# Patient Record
Sex: Male | Born: 1950 | Race: White | Hispanic: No | Marital: Single | Smoking: Former smoker
Health system: Southern US, Community
[De-identification: ages and names within clinical notes are randomized; demographics above are authoritative.]

## PROBLEM LIST (undated history)

## (undated) ENCOUNTER — Emergency Department (HOSPITAL_COMMUNITY): Admission: EM | Payer: Medicare Other | Source: Home / Self Care

## (undated) DIAGNOSIS — K219 Gastro-esophageal reflux disease without esophagitis: Secondary | ICD-10-CM

## (undated) DIAGNOSIS — K859 Acute pancreatitis without necrosis or infection, unspecified: Secondary | ICD-10-CM

## (undated) DIAGNOSIS — R059 Cough, unspecified: Secondary | ICD-10-CM

## (undated) DIAGNOSIS — L259 Unspecified contact dermatitis, unspecified cause: Secondary | ICD-10-CM

## (undated) DIAGNOSIS — M545 Low back pain, unspecified: Secondary | ICD-10-CM

## (undated) DIAGNOSIS — J449 Chronic obstructive pulmonary disease, unspecified: Secondary | ICD-10-CM

## (undated) DIAGNOSIS — F419 Anxiety disorder, unspecified: Secondary | ICD-10-CM

## (undated) DIAGNOSIS — M8580 Other specified disorders of bone density and structure, unspecified site: Secondary | ICD-10-CM

## (undated) DIAGNOSIS — J329 Chronic sinusitis, unspecified: Secondary | ICD-10-CM

## (undated) DIAGNOSIS — B182 Chronic viral hepatitis C: Secondary | ICD-10-CM

## (undated) DIAGNOSIS — N400 Enlarged prostate without lower urinary tract symptoms: Secondary | ICD-10-CM

## (undated) DIAGNOSIS — F32A Depression, unspecified: Secondary | ICD-10-CM

## (undated) DIAGNOSIS — T07XXXA Unspecified multiple injuries, initial encounter: Secondary | ICD-10-CM

## (undated) DIAGNOSIS — J189 Pneumonia, unspecified organism: Secondary | ICD-10-CM

## (undated) DIAGNOSIS — Z970 Presence of artificial eye: Secondary | ICD-10-CM

## (undated) DIAGNOSIS — J302 Other seasonal allergic rhinitis: Secondary | ICD-10-CM

## (undated) DIAGNOSIS — G8929 Other chronic pain: Secondary | ICD-10-CM

## (undated) DIAGNOSIS — H548 Legal blindness, as defined in USA: Secondary | ICD-10-CM

## (undated) DIAGNOSIS — I10 Essential (primary) hypertension: Secondary | ICD-10-CM

## (undated) DIAGNOSIS — S0292XA Unspecified fracture of facial bones, initial encounter for closed fracture: Secondary | ICD-10-CM

## (undated) DIAGNOSIS — R519 Headache, unspecified: Secondary | ICD-10-CM

## (undated) DIAGNOSIS — R05 Cough: Secondary | ICD-10-CM

## (undated) DIAGNOSIS — Z8719 Personal history of other diseases of the digestive system: Secondary | ICD-10-CM

## (undated) DIAGNOSIS — M1990 Unspecified osteoarthritis, unspecified site: Secondary | ICD-10-CM

## (undated) DIAGNOSIS — R51 Headache: Secondary | ICD-10-CM

## (undated) DIAGNOSIS — J45909 Unspecified asthma, uncomplicated: Secondary | ICD-10-CM

## (undated) DIAGNOSIS — Z87442 Personal history of urinary calculi: Secondary | ICD-10-CM

## (undated) DIAGNOSIS — F329 Major depressive disorder, single episode, unspecified: Secondary | ICD-10-CM

## (undated) HISTORY — PX: EYE SURGERY: SHX253

## (undated) HISTORY — PX: TONSILLECTOMY AND ADENOIDECTOMY: SUR1326

## (undated) HISTORY — DX: Unspecified contact dermatitis, unspecified cause: L25.9

## (undated) HISTORY — DX: Other specified disorders of bone density and structure, unspecified site: M85.80

## (undated) HISTORY — PX: FRACTURE SURGERY: SHX138

## (undated) HISTORY — PX: ENUCLEATION: SHX628

## (undated) HISTORY — PX: OTHER SURGICAL HISTORY: SHX169

## (undated) HISTORY — DX: Legal blindness, as defined in USA: H54.8

## (undated) HISTORY — PX: INGUINAL HERNIA REPAIR: SUR1180

## (undated) HISTORY — DX: Cough: R05

## (undated) HISTORY — DX: Chronic sinusitis, unspecified: J32.9

## (undated) HISTORY — DX: Unspecified asthma, uncomplicated: J45.909

## (undated) HISTORY — DX: Gastro-esophageal reflux disease without esophagitis: K21.9

## (undated) HISTORY — PX: PATELLA FRACTURE SURGERY: SHX735

## (undated) HISTORY — PX: COLONOSCOPY W/ BIOPSIES AND POLYPECTOMY: SHX1376

## (undated) HISTORY — DX: Cough, unspecified: R05.9

---

## 1997-10-30 ENCOUNTER — Encounter: Admission: RE | Admit: 1997-10-30 | Discharge: 1998-01-28 | Payer: Self-pay | Admitting: Anesthesiology

## 1997-11-02 ENCOUNTER — Emergency Department (HOSPITAL_COMMUNITY): Admission: EM | Admit: 1997-11-02 | Discharge: 1997-11-02 | Payer: Self-pay | Admitting: Internal Medicine

## 1998-01-05 ENCOUNTER — Emergency Department (HOSPITAL_COMMUNITY): Admission: EM | Admit: 1998-01-05 | Discharge: 1998-01-05 | Payer: Self-pay

## 1998-02-06 ENCOUNTER — Encounter: Admission: RE | Admit: 1998-02-06 | Discharge: 1998-04-30 | Payer: Self-pay | Admitting: Anesthesiology

## 1998-04-30 ENCOUNTER — Encounter: Admission: RE | Admit: 1998-04-30 | Discharge: 1998-07-25 | Payer: Self-pay | Admitting: Anesthesiology

## 1998-07-25 ENCOUNTER — Encounter: Admission: RE | Admit: 1998-07-25 | Discharge: 1998-10-23 | Payer: Self-pay | Admitting: Anesthesiology

## 1999-01-27 ENCOUNTER — Emergency Department (HOSPITAL_COMMUNITY): Admission: EM | Admit: 1999-01-27 | Discharge: 1999-01-27 | Payer: Self-pay | Admitting: Emergency Medicine

## 1999-01-27 ENCOUNTER — Encounter: Payer: Self-pay | Admitting: Emergency Medicine

## 1999-11-01 ENCOUNTER — Ambulatory Visit (HOSPITAL_COMMUNITY): Admission: RE | Admit: 1999-11-01 | Discharge: 1999-11-01 | Payer: Self-pay | Admitting: Internal Medicine

## 1999-11-01 ENCOUNTER — Encounter: Payer: Self-pay | Admitting: Internal Medicine

## 2001-06-20 ENCOUNTER — Emergency Department (HOSPITAL_COMMUNITY): Admission: EM | Admit: 2001-06-20 | Discharge: 2001-06-20 | Payer: Self-pay | Admitting: Emergency Medicine

## 2001-06-20 ENCOUNTER — Encounter: Payer: Self-pay | Admitting: Emergency Medicine

## 2001-06-22 ENCOUNTER — Inpatient Hospital Stay (HOSPITAL_COMMUNITY): Admission: EM | Admit: 2001-06-22 | Discharge: 2001-06-25 | Payer: Self-pay | Admitting: Emergency Medicine

## 2001-06-22 ENCOUNTER — Encounter: Payer: Self-pay | Admitting: Emergency Medicine

## 2001-06-29 ENCOUNTER — Encounter: Payer: Self-pay | Admitting: Specialist

## 2001-06-30 ENCOUNTER — Encounter: Payer: Self-pay | Admitting: Specialist

## 2001-06-30 ENCOUNTER — Inpatient Hospital Stay (HOSPITAL_COMMUNITY): Admission: RE | Admit: 2001-06-30 | Discharge: 2001-07-03 | Payer: Self-pay | Admitting: Specialist

## 2001-09-01 ENCOUNTER — Encounter: Admission: RE | Admit: 2001-09-01 | Discharge: 2001-11-30 | Payer: Self-pay | Admitting: *Deleted

## 2001-11-26 ENCOUNTER — Ambulatory Visit (HOSPITAL_COMMUNITY): Admission: RE | Admit: 2001-11-26 | Discharge: 2001-11-26 | Payer: Self-pay | Admitting: Specialist

## 2001-11-26 ENCOUNTER — Encounter: Payer: Self-pay | Admitting: Specialist

## 2001-12-01 ENCOUNTER — Encounter: Admission: RE | Admit: 2001-12-01 | Discharge: 2002-02-01 | Payer: Self-pay | Admitting: *Deleted

## 2002-01-04 ENCOUNTER — Encounter: Payer: Self-pay | Admitting: Orthopaedic Surgery

## 2002-01-04 ENCOUNTER — Encounter: Admission: RE | Admit: 2002-01-04 | Discharge: 2002-01-04 | Payer: Self-pay | Admitting: Orthopaedic Surgery

## 2002-03-14 ENCOUNTER — Emergency Department (HOSPITAL_COMMUNITY): Admission: EM | Admit: 2002-03-14 | Discharge: 2002-03-14 | Payer: Self-pay | Admitting: Emergency Medicine

## 2002-03-14 ENCOUNTER — Encounter: Payer: Self-pay | Admitting: Emergency Medicine

## 2002-11-04 ENCOUNTER — Emergency Department (HOSPITAL_COMMUNITY): Admission: EM | Admit: 2002-11-04 | Discharge: 2002-11-04 | Payer: Self-pay | Admitting: Emergency Medicine

## 2003-03-29 ENCOUNTER — Encounter
Admission: RE | Admit: 2003-03-29 | Discharge: 2003-06-27 | Payer: Self-pay | Admitting: Physical Medicine & Rehabilitation

## 2003-04-17 ENCOUNTER — Encounter
Admission: RE | Admit: 2003-04-17 | Discharge: 2003-06-02 | Payer: Self-pay | Admitting: Physical Medicine & Rehabilitation

## 2003-09-26 ENCOUNTER — Ambulatory Visit (HOSPITAL_COMMUNITY): Admission: RE | Admit: 2003-09-26 | Discharge: 2003-09-26 | Payer: Self-pay | Admitting: Orthopedic Surgery

## 2003-09-29 ENCOUNTER — Encounter
Admission: RE | Admit: 2003-09-29 | Discharge: 2003-12-28 | Payer: Self-pay | Admitting: Physical Medicine & Rehabilitation

## 2004-05-06 ENCOUNTER — Ambulatory Visit: Payer: Self-pay | Admitting: Critical Care Medicine

## 2004-08-01 ENCOUNTER — Emergency Department (HOSPITAL_COMMUNITY): Admission: EM | Admit: 2004-08-01 | Discharge: 2004-08-01 | Payer: Self-pay | Admitting: Emergency Medicine

## 2004-08-05 ENCOUNTER — Ambulatory Visit: Payer: Self-pay | Admitting: Critical Care Medicine

## 2005-01-16 ENCOUNTER — Encounter: Admission: RE | Admit: 2005-01-16 | Discharge: 2005-04-16 | Payer: Self-pay | Admitting: Orthopedic Surgery

## 2005-02-10 ENCOUNTER — Ambulatory Visit: Payer: Self-pay | Admitting: Critical Care Medicine

## 2005-03-10 ENCOUNTER — Ambulatory Visit: Payer: Self-pay | Admitting: Critical Care Medicine

## 2005-08-12 ENCOUNTER — Ambulatory Visit (HOSPITAL_COMMUNITY): Admission: RE | Admit: 2005-08-12 | Discharge: 2005-08-12 | Payer: Self-pay | Admitting: Internal Medicine

## 2006-05-18 ENCOUNTER — Ambulatory Visit: Payer: Self-pay | Admitting: Internal Medicine

## 2007-08-03 DIAGNOSIS — J45909 Unspecified asthma, uncomplicated: Secondary | ICD-10-CM | POA: Insufficient documentation

## 2007-08-03 DIAGNOSIS — M899 Disorder of bone, unspecified: Secondary | ICD-10-CM | POA: Insufficient documentation

## 2007-08-03 DIAGNOSIS — M949 Disorder of cartilage, unspecified: Secondary | ICD-10-CM

## 2007-08-03 DIAGNOSIS — J309 Allergic rhinitis, unspecified: Secondary | ICD-10-CM | POA: Insufficient documentation

## 2007-08-03 DIAGNOSIS — K219 Gastro-esophageal reflux disease without esophagitis: Secondary | ICD-10-CM

## 2007-08-03 DIAGNOSIS — J329 Chronic sinusitis, unspecified: Secondary | ICD-10-CM

## 2007-08-03 DIAGNOSIS — L259 Unspecified contact dermatitis, unspecified cause: Secondary | ICD-10-CM | POA: Insufficient documentation

## 2007-08-03 DIAGNOSIS — Z8719 Personal history of other diseases of the digestive system: Secondary | ICD-10-CM | POA: Insufficient documentation

## 2007-08-04 ENCOUNTER — Ambulatory Visit: Payer: Self-pay | Admitting: Pulmonary Disease

## 2007-08-04 ENCOUNTER — Ambulatory Visit: Payer: Self-pay | Admitting: Critical Care Medicine

## 2007-08-04 DIAGNOSIS — R05 Cough: Secondary | ICD-10-CM

## 2007-08-04 DIAGNOSIS — R059 Cough, unspecified: Secondary | ICD-10-CM | POA: Insufficient documentation

## 2007-08-04 DIAGNOSIS — R0602 Shortness of breath: Secondary | ICD-10-CM | POA: Insufficient documentation

## 2008-07-07 ENCOUNTER — Ambulatory Visit: Payer: Self-pay | Admitting: Internal Medicine

## 2008-07-11 ENCOUNTER — Telehealth (INDEPENDENT_AMBULATORY_CARE_PROVIDER_SITE_OTHER): Payer: Self-pay | Admitting: *Deleted

## 2008-07-31 ENCOUNTER — Ambulatory Visit: Payer: Self-pay | Admitting: Critical Care Medicine

## 2008-09-28 ENCOUNTER — Telehealth: Payer: Self-pay | Admitting: Critical Care Medicine

## 2008-09-28 ENCOUNTER — Encounter: Payer: Self-pay | Admitting: Critical Care Medicine

## 2008-10-27 ENCOUNTER — Ambulatory Visit: Payer: Self-pay | Admitting: Internal Medicine

## 2008-10-27 DIAGNOSIS — H548 Legal blindness, as defined in USA: Secondary | ICD-10-CM

## 2008-12-11 ENCOUNTER — Ambulatory Visit: Payer: Self-pay | Admitting: Critical Care Medicine

## 2009-03-22 ENCOUNTER — Ambulatory Visit: Payer: Self-pay | Admitting: Internal Medicine

## 2009-05-08 ENCOUNTER — Telehealth (INDEPENDENT_AMBULATORY_CARE_PROVIDER_SITE_OTHER): Payer: Self-pay | Admitting: *Deleted

## 2009-05-10 ENCOUNTER — Ambulatory Visit: Payer: Self-pay | Admitting: Critical Care Medicine

## 2009-05-10 DIAGNOSIS — J018 Other acute sinusitis: Secondary | ICD-10-CM

## 2009-05-28 ENCOUNTER — Ambulatory Visit: Payer: Self-pay | Admitting: Critical Care Medicine

## 2009-05-28 ENCOUNTER — Telehealth (INDEPENDENT_AMBULATORY_CARE_PROVIDER_SITE_OTHER): Payer: Self-pay | Admitting: *Deleted

## 2009-06-11 ENCOUNTER — Telehealth (INDEPENDENT_AMBULATORY_CARE_PROVIDER_SITE_OTHER): Payer: Self-pay | Admitting: *Deleted

## 2009-07-25 ENCOUNTER — Encounter: Admission: RE | Admit: 2009-07-25 | Discharge: 2009-10-23 | Payer: Self-pay | Admitting: Ophthalmology

## 2009-08-15 ENCOUNTER — Telehealth: Payer: Self-pay | Admitting: Critical Care Medicine

## 2009-08-15 ENCOUNTER — Telehealth (INDEPENDENT_AMBULATORY_CARE_PROVIDER_SITE_OTHER): Payer: Self-pay | Admitting: *Deleted

## 2009-08-29 ENCOUNTER — Ambulatory Visit: Payer: Self-pay | Admitting: Critical Care Medicine

## 2010-01-30 ENCOUNTER — Telehealth: Payer: Self-pay | Admitting: Critical Care Medicine

## 2010-01-31 ENCOUNTER — Telehealth (INDEPENDENT_AMBULATORY_CARE_PROVIDER_SITE_OTHER): Payer: Self-pay | Admitting: *Deleted

## 2010-01-31 ENCOUNTER — Ambulatory Visit: Payer: Self-pay | Admitting: Critical Care Medicine

## 2010-02-01 ENCOUNTER — Telehealth (INDEPENDENT_AMBULATORY_CARE_PROVIDER_SITE_OTHER): Payer: Self-pay | Admitting: *Deleted

## 2010-03-05 ENCOUNTER — Ambulatory Visit: Payer: Self-pay | Admitting: Critical Care Medicine

## 2010-03-05 ENCOUNTER — Telehealth (INDEPENDENT_AMBULATORY_CARE_PROVIDER_SITE_OTHER): Payer: Self-pay | Admitting: *Deleted

## 2010-03-14 ENCOUNTER — Telehealth (INDEPENDENT_AMBULATORY_CARE_PROVIDER_SITE_OTHER): Payer: Self-pay | Admitting: *Deleted

## 2010-06-18 NOTE — Progress Notes (Signed)
Summary: advair  Phone Note Call from Patient Call back at Home Phone [phone]   Caller: Patient Call For: wright Summary of Call: requests advair 500-[facility]. [facility] [address] Initial call taken by: Tivis Ringer, CNA,  August 15, 2009 8:58 AM  Follow-up for Phone Call        spoke with pt and advised that he was overdue for follow-up so we needed to get this scheduled. Appt set for 08/29/09 at 2:15. Pt asked if I could mail appt card tohome address. Card mailed and refill sent. pt aware.Carron Curie CMA  August 15, 2009 10:04 AM     Prescriptions: ADVAIR DISKUS 500-50 MCG/DOSE  MISC (FLUTICASONE-SALMETEROL) Inhale 1 puff two times a day  #1 x 0   Entered by:   Carron Curie CMA   Authorized by:   Storm Frisk MD   Signed by:   Carron Curie CMA on 08/15/2009   Method used:   [facility]ron[facility]        [phone][facility] (retail)       [address] [place], [place]  [address]       Ph: [phone]       Fax: [phone]   RxID:   [id number]

## 2010-06-18 NOTE — Progress Notes (Signed)
Summary: levaquin not covered  Phone Note From Pharmacy Call back at [phone]   [id number] Call For: tammy p  Summary of Call: Leviquin not covered by pt's insurance - Covered antibiotics are Avelox and Cipro. Initial call taken by: Eugene Gavia,  May 28, 2009 12:53 PM  Follow-up for Phone Call        per TP: okay to change to avelox 400mg  #7, 1 by mouth once daily.    called into pharmacy.    New/Updated Medications: AVELOX 400 MG TABS (MOXIFLOXACIN HCL) Take 1 tablet by mouth once a day x7days Prescriptions: AVELOX 400 MG TABS (MOXIFLOXACIN HCL) Take 1 tablet by mouth once a day x7days  #7 x 0   Entered by:   Boone Master CNA   Authorized by:   Rubye Oaks NP   Signed by:   Boone Master CNA on 05/28/2009   Method used:   Telephoned to ...       [facility] [address] [facility] [address] (retail)       [address] [place], [place]  [address]       Ph: [phone]       Fax: [phone]   RxID:   [id number]

## 2010-06-18 NOTE — Assessment & Plan Note (Signed)
Summary: Acute NP office visit - COPD, asthma   Primary Provider/Referring Provider:  Dorothyann Peng  CC:  wheezing, increased SOB, and nasal drainage and PND .  History of Present Illness: 53 with known history of severe atopic asthma.    August 29, 2009 2:29 PM Has central air now.  Noting more tightness in the evening. Notes more nasal issues.  R side has green yellow and now light yellow.  Notes more headaches.    January 31, 2010--Presents for an acute office visit. Complains of wheezing, increased SOB, nasal drainage and PND - worse for last 2 weeks. Has had on /off symptoms for last 6 months. Has  has trouble w/ increased depression seeing pschy. He has extensive med list which is costly. Sometimes he has to skip meds b/c he is out of money. He has been out of Adviar  x 3 weeks.  Feels like his sinus drainage tickles his airway and causes him to wheeze. Denies chest pain,  orthopnea, hemoptysis, fever, n/v/d, edema, headache.       Medications Prior to Update: 1)  Allegra-D 12 Hour 60-120 Mg Tb12 (Fexofenadine-Pseudoephedrine) .... Take 1 Tablet By Mouth Two Times A Day 2)  Restoril 15 Mg  Caps (Temazepam) .... Take 1 Tablet By Mouth Once A Day 3)  Gentamicin Sulfate 0.1 %  Crea (Gentamicin Sulfate) .... Use As Directed 4)  Diazepam 10 Mg Tabs (Diazepam) .... Take 1 Tablet By Mouth Three Times A Day 5)  Hydroxyzine Hcl 25 Mg  Tabs (Hydroxyzine Hcl) .... As Needed 6)  Valtrex 500 Mg  Tabs (Valacyclovir Hcl) .... As Needed 7)  Desoximetasone 0.05 %  Crea (Desoximetasone) .... Use As Directed 8)  Epi Pen .... As Needed 9)  Albuterol Sulfate (2.5 Mg/77ml) 0.083%  Nebu (Albuterol Sulfate) .Marland Kitchen.. 1 Vial Every 4 Hours As Needed 10)  Singulair 10 Mg  Tabs (Montelukast Sodium) .... Take 1 Tablet By Mouth Once A Day 11)  Advair Diskus 500-50 Mcg/dose  Misc (Fluticasone-Salmeterol) .... Inhale 1 Puff Two Times A Day 12)  Ventolin Hfa 108 (90 Base) Mcg/act  Aers (Albuterol Sulfate) ....  Inhale 2 Puffs Every Four Hours As Needed 13)  Nasonex 50 Mcg/act Susp (Mometasone Furoate) .... 2 Sprays in Each Nostril Daily 14)  Refresh Tears 0.5 % Soln (Carboxymethylcellulose Sodium) .Marland Kitchen.. 1 Drop in Each Eye Every 2 Hours 15)  Omnipred 1 % Susp (Prednisolone Acetate) .... Three Times A Day in Right Eye 16)  Patanol 0.1 % Soln (Olopatadine Hcl) .... Two Drops Both Eye 17)  Zinc ? Mg .... Two Times A Day 18)  Calcium Adn Vit D ?mg .... Two Times A Day 19)  Delsym Night Time Multi-Sympt 5-6.25-10-325 Mg/30ml Liqd (Phenyleph-Doxylamine-Dm-Apap) .... 2 Tsp Every 3 Hours As Needed 20)  Trazodone Hcl 100 Mg Tabs (Trazodone Hcl) .... Take 1 Tablet By Mouth Once A Day 21)  Vitamin D 5000units .... Twice Weekly 22)  Benicar 20 Mg  Tabs (Olmesartan Medoxomil) .... One Tablet By Mouth Daily 23)  Sertraline Hcl 25 Mg Tabs (Sertraline Hcl) .... Take 1 Tab By Mouth Once Daily For 3 Days,then Increase To 2 Tabs By Mouth Daily  Current Medications (verified): 1)  Allegra-D 12 Hour 60-120 Mg Tb12 (Fexofenadine-Pseudoephedrine) .... Take 1 Tablet By Mouth Two Times A Day 2)  Restoril 15 Mg  Caps (Temazepam) .... Take 1 Tablet By Mouth Once A Day As Needed Sleep 3)  Gentamicin Sulfate 0.1 %  Crea (Gentamicin Sulfate) .... Use As Directed  4)  Diazepam 10 Mg Tabs (Diazepam) .... Take 1 Tablet By Mouth Three Times A Day 5)  Hydroxyzine Hcl 25 Mg  Tabs (Hydroxyzine Hcl) .... As Needed 6)  Valtrex 500 Mg  Tabs (Valacyclovir Hcl) .... As Needed 7)  Desoximetasone 0.05 %  Crea (Desoximetasone) .... Use As Directed 8)  Epi Pen .... As Needed 9)  Albuterol Sulfate (2.5 Mg/83ml) 0.083%  Nebu (Albuterol Sulfate) .Marland Kitchen.. 1 Vial Every 4 Hours As Needed 10)  Singulair 10 Mg  Tabs (Montelukast Sodium) .... Take 1 Tablet By Mouth Once A Day 11)  Advair Diskus 500-50 Mcg/dose  Misc (Fluticasone-Salmeterol) .... Inhale 1 Puff Two Times A Day 12)  Ventolin Hfa 108 (90 Base) Mcg/act  Aers (Albuterol Sulfate) .... Inhale 2 Puffs  Every Four Hours As Needed 13)  Nasonex 50 Mcg/act Susp (Mometasone Furoate) .... 2 Sprays in Each Nostril Daily 14)  Refresh Tears 0.5 % Soln (Carboxymethylcellulose Sodium) .Marland Kitchen.. 1 Drop in Each Eye Every 2 Hours 15)  Omnipred 1 % Susp (Prednisolone Acetate) .... Three Times A Day in Right Eye 16)  Patanol 0.1 % Soln (Olopatadine Hcl) .... Two Drops Both Eye 17)  Zinc 100 Mg Tabs (Zinc) .... Two Times A Day 18)  Calcium Carbonate-Vitamin D 600-400 Mg-Unit  Tabs (Calcium Carbonate-Vitamin D) .... Take 1 Tablet By Mouth Two Times A Day 19)  Delsym Night Time Multi-Sympt 5-6.25-10-325 Mg/16ml Liqd (Phenyleph-Doxylamine-Dm-Apap) .... 2 Tsp Every 3 Hours As Needed 20)  Trazodone Hcl 100 Mg Tabs (Trazodone Hcl) .... Take 1 Tablet By Mouth Once A Day As Needed Sleep 21)  Benicar 40 Mg Tabs (Olmesartan Medoxomil) .... Take 1 Tablet By Mouth Once A Day 22)  Zoloft 100 Mg Tabs (Sertraline Hcl) .... 2 Tabs By Mouth Once Daily 23)  Ibuprofen 800 Mg Tabs (Ibuprofen) .Marland Kitchen.. 1 Tab By Mouth Every 12 Hours  Allergies (verified): 1)  ! Codeine 2)  ! * Polysporin 3)  ! * Plastic Tape 4)  ! 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   Risk Factors: Smoking Status: never (08/29/2009)  Review of Systems      See HPI  Vital Signs:  Patient profile:   60 year old male Height:      71 inches Weight:       168.25 pounds BMI:     23.55 O2 Sat:      97 % on Room air Temp:     97.5 degrees F oral Pulse rate:   72 / minute BP sitting:   146 / 98  (left arm) Cuff size:   regular  Vitals Entered By: Boone Master CNA/MA (January 31, 2010 11:12 AM)  O2 Flow:  Room air CC: wheezing, increased SOB,  nasal drainage and PND  Is Patient Diabetic? No Comments Medications reviewed with patient Daytime contact number verified with patient. 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 RESP  coarse BS w/ few rhonchi, faint exp wheeze on forced expiration  CARD:  RRR, no m/r/g   GI:   Soft & nt; nml bowel sounds; no organomegaly or masses detected. Musco: Warm bil,  no calf tenderness edema, clubbing, pulses intact Neuro: alert and oriented no focal deficits.  Skin: eczematous changes along face and neck.    Impression & Recommendations:  Problem # 1:  ASTHMA, PERSISTENT, SEVERE (ICD-493.90) Flare w/ increased rhinitis symptoms, try to control triggers samples given.  Plan: Depo medrol 120mg   Change Allegra over to Clarinex D two times a day as needed allergies.  Begin Astepro/Astelin 2 puffs at bedtime  Saline nasal rinse as needed  Restart Advair 1 puff two times a day --brush/rinse/gargle.  Prednisone taper over next week .  follow up Dr. Delford Field in 4-6 weeks  Please contact office for sooner follow up if symptoms do not improve or worsen   Medications Added to Medication List This Visit: 1)  Clarinex-d 12 Hour 2.5-120 Mg Xr12h-tab (Desloratadine-pseudoephedrine) .Marland Kitchen.. 1 by mouth two times a day as needed allergies 2)  Restoril 15 Mg  Caps (Temazepam) .... Take 1 tablet by mouth once a day as needed sleep 3)  Zinc 100 Mg Tabs (Zinc) .... Two times a day 4)  Calcium Carbonate-vitamin D 600-400 Mg-unit Tabs (Calcium carbonate-vitamin d) .... Take 1 tablet by mouth two times a day 5)  Trazodone Hcl 100 Mg Tabs (Trazodone hcl) .... Take 1 tablet by mouth once a day as needed sleep 6)  Benicar 40 Mg Tabs (Olmesartan medoxomil) .... Take 1 tablet by mouth once a day 7)  Zoloft 100 Mg Tabs (Sertraline hcl) .... 2 tabs by mouth once daily 8)  Ibuprofen 800 Mg Tabs (Ibuprofen) .Marland Kitchen.. 1 tab by mouth every 12 hours 9)  Astelin 137 Mcg/spray Soln (Azelastine hcl) .... 2 puffs at bedtime 10)  Prednisone 10 Mg Tabs (Prednisone) .... 4 tabs for 2 days, then 3 tabs for 2 days, 2 tabs for 2 days, then 1 tab for 2 days, then stop  Complete Medication List: 1)  Clarinex-d 12 Hour 2.5-120 Mg Xr12h-tab (Desloratadine-pseudoephedrine) .Marland Kitchen.. 1 by mouth two times a day as needed allergies 2)  Restoril 15 Mg Caps (Temazepam) .... Take 1 tablet by mouth once a day as needed sleep 3)  Gentamicin Sulfate 0.1 % Crea (Gentamicin sulfate) .... Use as directed 4)  Diazepam 10 Mg Tabs (Diazepam) .... Take 1 tablet by mouth three times a day 5)  Hydroxyzine Hcl 25 Mg Tabs (Hydroxyzine hcl) .... As needed 6)  Valtrex 500 Mg Tabs (Valacyclovir hcl) .... As needed 7)  Desoximetasone 0.05 % Crea (Desoximetasone) .... Use as directed 8)  Epi Pen  .... As needed 9)  Albuterol Sulfate (2.5 Mg/12ml) 0.083% Nebu (Albuterol sulfate) .Marland Kitchen.. 1 vial every 4 hours as needed 10)  Singulair 10 Mg Tabs (Montelukast sodium) .... Take 1 tablet by mouth once a day 11)  Advair Diskus 500-50 Mcg/dose Misc (Fluticasone-salmeterol) .... Inhale 1 puff two times a day 12)  Ventolin Hfa 108 (90 Base) Mcg/act Aers (Albuterol sulfate) .... Inhale 2 puffs every four hours as needed 13)  Nasonex 50 Mcg/act Susp (Mometasone furoate) .... 2 sprays in each nostril daily 14)  Refresh Tears 0.5 %  Soln (  Carboxymethylcellulose sodium) .Marland Kitchen.. 1 drop in each eye every 2 hours 15)  Omnipred 1 % Susp (Prednisolone acetate) .... Three times a day in right eye 16)  Patanol 0.1 % Soln (Olopatadine hcl) .... Two drops both eye 17)  Zinc 100 Mg Tabs (Zinc) .... Two times a day 18)  Calcium Carbonate-vitamin D 600-400 Mg-unit Tabs (Calcium carbonate-vitamin d) .... Take 1 tablet by mouth two times a day 19)  Delsym Night Time Multi-sympt 5-6.25-10-325 Mg/22ml Liqd (Phenyleph-doxylamine-dm-apap) .... 2 tsp every 3 hours as needed 20)  Trazodone Hcl 100 Mg Tabs (Trazodone hcl) .... Take 1 tablet by mouth once a day as needed sleep 21)  Benicar 40 Mg Tabs (Olmesartan medoxomil) .... Take 1 tablet by mouth once a day 22)  Zoloft 100 Mg Tabs (Sertraline hcl) .... 2 tabs by mouth once daily 23)  Ibuprofen 800 Mg Tabs (Ibuprofen) .Marland Kitchen.. 1 tab by mouth every 12 hours 24)  Astelin 137 Mcg/spray Soln (Azelastine hcl) .... 2 puffs at bedtime 25)  Prednisone 10 Mg Tabs (Prednisone) .... 4 tabs for 2 days, then 3 tabs for 2 days, 2 tabs for 2 days, then 1 tab for 2 days, then stop  Other Orders: Admin of Therapeutic Inj  intramuscular or subcutaneous (60454) Depo- Medrol 80mg  (J1040) Depo- Medrol 40mg  (J1030) Est. Patient Level IV (09811)  Patient Instructions: 1)  Change Allegra over to Clarinex D two times a day as needed allergies.  2)  Begin Astepro/Astelin 2 puffs at bedtime  3)  Saline nasal rinse as needed  4)  Restart Advair 1 puff two times a day --brush/rinse/gargle.  5)  Prednisone taper over next week .  6)  follow up Dr. Delford Field in 4-6 weeks  7)  Please contact office for sooner follow up if symptoms do not improve or worsen  Prescriptions: PREDNISONE 10 MG TABS (PREDNISONE) 4 tabs for 2 days, then 3 tabs for 2 days, 2 tabs for 2 days, then 1 tab for 2 days, then stop  #20 x 0   Entered and Authorized by:   Rubye Oaks NP   Signed by:   Rubye Oaks NP on 01/31/2010   Method used:    Electronically to        [facility] [address] (retail)       [phone]       [place], [place]  [address]       Ph: [phone]       Fax: [phone]   RxID:   [id number] ASTELIN 137 MCG/SPRAY SOLN (AZELASTINE HCL) 2 puffs at bedtime  #1 x 11   Entered and Authorized by:   Rubye Oaks NP   Signed by:   Rubye Oaks NP on 01/31/2010   Method used:   Electronically to        [facility] [address] (retail)       [phone]       [place], [place]  [address]       Ph: [phone]       Fax: [phone]   RxID:   [id number] CLARINEX-D 12 HOUR 2.5-120 MG XR12H-TAB (DESLORATADINE-PSEUDOEPHEDRINE) 1 by mouth two times a day as needed allergies  #60 x 11   Entered and Authorized by:   Rubye Oaks NP   Signed by:   Tammy Parrett NP on 01/31/2010   Method used:   Electronically to  [facility] [address] (retail)       [phone]       [place], [place]  [address]       Ph: [phone]       Fax: [phone]   RxID:   [id number]    Medication Administration  Injection # 1:    Medication: Depo- Medrol 40mg     Diagnosis: ASTHMA, PERSISTENT, SEVERE (ICD-493.90)    Route: IM    Site: LUOQ gluteus    Exp Date: 08-2012    Lot #: [id number]    Mfr: Pharmacia    Patient tolerated injection without complications    Given by: Boone Master CNA/MA (January 31, 2010 12:18 PM)  Injection # 2:    Medication: Depo- Medrol 80mg     Diagnosis: ASTHMA, PERSISTENT, SEVERE (ICD-493.90)    Route: IM    Site: LUOQ gluteus    Exp Date: 08-2012    Lot #: [id number]    Mfr: Pharmacia    Patient tolerated injection without complications    Given by: Boone Master CNA/MA (January 31, 2010 12:19 PM)  Orders Added: 1)  Admin of Therapeutic Inj  intramuscular or subcutaneous [96372] 2)  Depo- Medrol 80mg  [J1040] 3)  Depo- Medrol 40mg  [J1030] 4)  Est. Patient Level IV  [24401]

## 2010-06-18 NOTE — Progress Notes (Signed)
Summary: MEDICATION-ATC BUSY  Phone Note Call from Patient   Caller: Patient Call For: PARRETT Summary of Call: PT WOULD LIKE GENERIC TUSSINEX CALLED TO PHARMACY [facility] [address] Initial call taken by: Rickard Patience,  June 11, 2009 10:48 AM  Follow-up for Phone Call        ATC x 2 line busy- [facility] Vernie Murders  June 11, 2009 2:07 PM  Spoke with pt.  He c/o bad taste and HA from taking hydromet syrup.  He states that if he has "too much" hydrocodone it causes HA.  He states that each tsp of hydromet has 5 mg hydrocodone and he has been needing to take this every 4 hours.  He is requesting rx for tussionex, as this has 10 mg hydrocodone in each tsp, but lasts for 12 hours.  Please advise if this is ok thanks Follow-up by: Vernie Murders,  June 11, 2009 2:32 PM  Additional Follow-up for Phone Call Additional follow up Details #1::        that is fine #4oz no refills tussionex #4oz   1 tsp every 12 hr as needed cough NO REFILLS Additional Follow-up by: Rubye Oaks NP,  June 11, 2009 3:04 PM    Additional Follow-up for Phone Call Additional follow up Details #2::    Done, rx was called to pharm and pt made aware this was done. Follow-up by: Vernie Murders,  June 11, 2009 3:10 PM  New/Updated Medications: Sandria Senter ER 8-10 MG/5ML LQCR (CHLORPHENIRAMINE-HYDROCODONE) 1 tsp every 12 hours as needed for cough Prescriptions: TUSSIONEX PENNKINETIC ER 8-10 MG/5ML LQCR (CHLORPHENIRAMINE-HYDROCODONE) 1 tsp every 12 hours as needed for cough  #4 oz x 0   Entered by:   Vernie Murders   Authorized by:   Rubye Oaks NP   Signed by:   Vernie Murders on 06/11/2009   Method used:   Telephoned to ...       [facility] [address] (retail)       [phone]       [place], [place]  [address]       Ph: [phone]       Fax: [phone]   RxID:   [id number]

## 2010-06-18 NOTE — Assessment & Plan Note (Signed)
Summary: Pulmonary OV   Primary Provider/Referring Provider:  Dorothyann Peng  CC:  F/U, pt c/o nasal congestion and swelling pt also staes he yellow discharge which has now turned to ligh yellow X 1 week, pt c/o chest tughtnessX 4wks, and pt states he was a little better after prednisone.Marland Kitchen  History of Present Illness: 19 with known history of severe atopic asthma.    August 29, 2009 2:29 PM Has central air now.  Noting more tightness in the evening. Notes more nasal issues.  R side has green yellow and now light yellow.  Notes more headaches.         Asthma History    Asthma Control Assessment:    Age range: 12+ years    Symptoms: >2 days/week    Nighttime Awakenings: 0-2/month    Interferes w/ normal activity: some limitations    SABA use (not for EIB): >2 days/week    ATAQ questionnaire: 1-2    Exacerbations requiring oral systemic steroids: 0-1/year    Asthma Control Assessment: Not Well Controlled   Preventive Screening-Counseling & Management  Alcohol-Tobacco     Smoking Status: never  Current Medications (verified): 1)  Allegra-D 12 Hour 60-120 Mg Tb12 (Fexofenadine-Pseudoephedrine) .... Take 1 Tablet By Mouth Two Times A Day 2)  Restoril 15 Mg  Caps (Temazepam) .... Take 1 Tablet By Mouth Once A Day 3)  Gentamicin Sulfate 0.1 %  Crea (Gentamicin Sulfate) .... Use As Directed 4)  Diazepam 10 Mg Tabs (Diazepam) .... Take 1 Tablet By Mouth Three Times A Day 5)  Hydroxyzine Hcl 25 Mg  Tabs (Hydroxyzine Hcl) .... As Needed 6)  Valtrex 500 Mg  Tabs (Valacyclovir Hcl) .... As Needed 7)  Desoximetasone 0.05 %  Crea (Desoximetasone) .... Use As Directed 8)  Epi Pen .... As Needed 9)  Albuterol Sulfate (2.5 Mg/66ml) 0.083%  Nebu (Albuterol Sulfate) .Marland Kitchen.. 1 Vial Every 4 Hours As Needed 10)  Singulair 10 Mg  Tabs (Montelukast Sodium) .... Take 1 Tablet By Mouth Once A Day 11)  Advair Diskus 500-50 Mcg/dose  Misc (Fluticasone-Salmeterol) .... Inhale 1 Puff Two Times A Day 12)   Ventolin Hfa 108 (90 Base) Mcg/act  Aers (Albuterol Sulfate) .... Inhale 2 Puffs Every Four Hours As Needed 13)  Nasonex 50 Mcg/act Susp (Mometasone Furoate) .... 2 Sprays in Each Nostril Daily 14)  Refresh Tears 0.5 % Soln (Carboxymethylcellulose Sodium) .Marland Kitchen.. 1 Drop in Each Eye Every 2 Hours 15)  Omnipred 1 % Susp (Prednisolone Acetate) .... Three Times A Day in Right Eye 16)  Patanol 0.1 % Soln (Olopatadine Hcl) .... Two Drops Both Eye 17)  Zinc ? Mg .... Two Times A Day 18)  Calcium Adn Vit D ?mg .... Two Times A Day 19)  Delsym Night Time Multi-Sympt 5-6.25-10-325 Mg/58ml Liqd (Phenyleph-Doxylamine-Dm-Apap) .... 2 Tsp Every 3 Hours As Needed 20)  Trazodone Hcl 100 Mg Tabs (Trazodone Hcl) .... Take 1 Tablet By Mouth Once A Day 21)  Vitamin D 5000units .... Twice Weekly 22)  Lisinopril 10 Mg Tabs (Lisinopril) .Marland Kitchen.. 1 Tab Once Daily 23)  Sertraline Hcl 25 Mg Tabs (Sertraline Hcl) .... Take 1 Tab By Mouth Once Daily For 3 Days,then Increase To 2 Tabs By Mouth Daily  Allergies (verified): 1)  ! Codeine 2)  ! * Polysporin 3)  ! * Plastic Tape 4)  ! Penicillin  Past History:  Past medical, surgical, family and social histories (including risk factors) reviewed, and no changes noted (except as noted below).  Past  Medical History: Reviewed history from 10/27/2008 and no changes required. 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 Mother- atopic allergies, asthma, deceased -CHF Father- deceased ? in mid 69s  Social History: Reviewed history from 10/27/2008 and no changes required. 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 RESP  coarse BS w/ few rhonchi, no wheezing.  CARD:  RRR, no m/r/g   GI:   Soft & nt; nml bowel sounds; no organomegaly or masses detected. Musco: Warm bil,  no calf tenderness edema, clubbing, pulses intact Neuro: alert and oriented no focal deficits.  Skin: eczematous changes along face and neck.    Impression & Recommendations:  Problem # 1:  ALLERGIC RHINITIS (ICD-477.9) Assessment Unchanged Stable, severe allergic rhinitis plan cont topical nasal steroid  His updated medication list for this problem includes:    Nasonex 50 Mcg/act Susp (Mometasone furoate) .Marland Kitchen... 2 sprays in each nostril daily  Orders: Est. Patient Level III (69629)  Problem # 2:  ASTHMA, PERSISTENT, SEVERE (ICD-493.90) Assessment: Unchanged Severe persistent asthma stable at this time, however ACE inhibitor in setting of severe asthma, postnasal drip, upper airway instability and advair use is not a good combination, pt is already clearing throat constantly plan d/c lisinopril start benicar 20mg /d  samples given pt instructed to communicate same with primary care MD  Medications Added to Medication List This Visit: 1)  Allegra-d 12 Hour 60-120 Mg Tb12  (Fexofenadine-pseudoephedrine) .... Take 1 tablet by mouth two times a day 2)  Benicar 20 Mg Tabs (Olmesartan medoxomil) .... One tablet by mouth daily 3)  Sertraline Hcl 25 Mg Tabs (Sertraline hcl) .... Take 1 tab by mouth once daily for 3 days,then increase to 2 tabs by mouth daily  Complete Medication List: 1)  Allegra-d 12 Hour 60-120 Mg Tb12 (Fexofenadine-pseudoephedrine) .... Take 1 tablet by mouth two times a day 2)  Restoril 15 Mg Caps (Temazepam) .... Take 1 tablet by mouth once a day 3)  Gentamicin Sulfate 0.1 % Crea (Gentamicin sulfate) .... Use as directed 4)  Diazepam 10 Mg Tabs (Diazepam) .... Take 1 tablet by mouth three times a day 5)  Hydroxyzine Hcl 25 Mg Tabs (Hydroxyzine hcl) .... As needed 6)  Valtrex 500 Mg Tabs (Valacyclovir hcl) .... As needed 7)  Desoximetasone 0.05 % Crea (Desoximetasone) .Marland KitchenMarland KitchenMarland Kitchen  Use as directed 8)  Epi Pen  .... As needed 9)  Albuterol Sulfate (2.5 Mg/29ml) 0.083% Nebu (Albuterol sulfate) .Marland Kitchen.. 1 vial every 4 hours as needed 10)  Singulair 10 Mg Tabs (Montelukast sodium) .... Take 1 tablet by mouth once a day 11)  Advair Diskus 500-50 Mcg/dose Misc (Fluticasone-salmeterol) .... Inhale 1 puff two times a day 12)  Ventolin Hfa 108 (90 Base) Mcg/act Aers (Albuterol sulfate) .... Inhale 2 puffs every four hours as needed 13)  Nasonex 50 Mcg/act Susp (Mometasone furoate) .... 2 sprays in each nostril daily 14)  Refresh Tears 0.5 % Soln (Carboxymethylcellulose sodium) .Marland Kitchen.. 1 drop in each eye every 2 hours 15)  Omnipred 1 % Susp (Prednisolone acetate) .... Three times a day in right eye 16)  Patanol 0.1 % Soln (Olopatadine hcl) .... Two drops both eye 17)  Zinc ? Mg  .... Two times a day 18)  Calcium Adn Vit D ?mg  .... Two times a day 19)  Delsym Night Time Multi-sympt 5-6.25-10-325 Mg/66ml Liqd (Phenyleph-doxylamine-dm-apap) .... 2 tsp every 3 hours as needed 20)  Trazodone Hcl 100 Mg Tabs (Trazodone hcl) .... Take 1 tablet by mouth once a day 21)  Vitamin D  5000units  .... Twice weekly 22)  Benicar 20 Mg Tabs (Olmesartan medoxomil) .... One tablet by mouth daily 23)  Sertraline Hcl 25 Mg Tabs (Sertraline hcl) .... Take 1 tab by mouth once daily for 3 days,then increase to 2 tabs by mouth daily  Patient Instructions: 1)  Stop lisinopril 2)  Start Benicar 20mg  daily 3)  I will contact your primary care MD on the lisinopril 4)  No other medication changes 5)  Return 3 months  Prescriptions: ALLEGRA-D 12 HOUR 60-120 MG TB12 (FEXOFENADINE-PSEUDOEPHEDRINE) Take 1 tablet by mouth two times a day  #60 x 6   Entered and Authorized by:   Storm Frisk MD   Signed by:   Storm Frisk MD on 08/29/2009   Method used:   Electronically to        [phone][facility] (retail)       [address] [place], [place]  [address]       Ph: [phone]       Fax: [phone]   RxID:   [id number]   Appended Document: Pulmonary OV fax Dorothyann Peng

## 2010-06-18 NOTE — Progress Notes (Signed)
Summary: rx request for allegra  Phone Note Call from Patient Call back at Home Phone [phone]   Caller: Patient Call For: wright Summary of Call: pt requests a rx for [facility]. he has been given samples of [facility] [address] Initial call taken by: Tivis Ringer, CNA,  August 15, 2009 2:17 PM  Follow-up for Phone Call        rx sent to pharmacy.  pt aware.  Megan Reynolds LPN  August 15, 2009 2:22 PM     Prescriptions: ALLEGRA-D 12 HOUR 60-120 MG TB12 (FEXOFENADINE-PSEUDOEPHEDRINE) Take 1 tablet by mouth two times a day as needed  #30 x 3   Entered by:   Arman Filter LPN   Authorized by:   Storm Frisk MD   Signed by:   Arman Filter LPN on 91/47/8295   Method used:   [facility] to        [facility] [phone][facility] (retail)       [address] [place], [place]  [address]       Ph: [phone]       Fax: [phone]   RxID:   [id number]

## 2010-06-18 NOTE — Assessment & Plan Note (Signed)
Summary: Acute NP office visit - still SOB, cough   Primary Provider/Referring Provider:  Dr. Penne Lash  CC:  2 week follow up: still having SOB, wheezing, tightness in chest, cough occ producing yellow mucus, and right ear discomfort.  states symptoms worsened again after finishing the abx around New Years..  History of Present Illness: 60 with known history of severe atopic asthma,   The patient had previously been managed on theophylline 200 mg, four tab b.i.d.,Rhinocort two puffs b.i.d. and Advair 500/50 mg b.i.d. and Singulair 10mg  daily.  Pt is blind. Very little sight in right  eye (can see color)   3/09--3 weeks of cough, congestion, green thick mucus,  .  tx w/ avelox 7 days and steroids.  CXR w/ chronic changes.   July 07, 2008 --Returns for acute work in visit. Complains of increased SOB, tightness in chest, cough, congestion-thick wheezing. Did not follow up from last visit. "I started feeling better and did not come back". Cough is keeeping him up at night.    July 31, 2008  --flare last visit- rx avelox and prednisone.   Pt is better with less dyspnea.  No cough now.   October 27, 2008--Presents for an acute office visit.Pt c/o chest tightness more at night, non-productive cough, nasal congestion and runny. Increased allergy syjmptoms over last 2 months w/ runny nose, congestion, drip/drainage   December 11, 2008 9:47 AM Much better with less cough.  Only tight in chest at night.  Minimal wheeze.  Minimal rescue inhaler use.   March 22, 2009--Presents for acute office viist. Complains of  head congestion, PND, chest tightness, wheezing at night and early morning, productive cough with mucus clear to opaque in color x 2 weeks. Pt requesting flu shot. Had right eye surgery /cornea transplant in july this year, has increased sight in right eye, is so happy about this.  May 10, 2009 -- was seen 11/4 by NP and rx was : Flu shot  Doxcycline 100mg  two times a day for  7days Mucinex DM two times a day as needed cough.  Tussicaps at bedtime as needed as needed cough, may make you sleepy.  Now:  notes more nasal  and sinus congestion and drains at night and more nocturnal dyspnea Now in house 24/7;  has a new furnace ,  last year had old furnace and had issues.   Ductwork is still an issue with black soot.   May 28, 2009--Presents for an acute office visiit. Complains of persistent symptoms of cough, congesiton. Never totally resolved from flare 2 months ago, did improve but 2 weeks ago, cough started back . Seen in office 12/23 , rx Avelox and depo Medrol. Finished 10 days ago, Has not improved.  Wheezing and cough are keeping him up. Denies chest pain, orthopnea, hemoptysis, fever, n/v/d, edema, headache.     Medications Prior to Update: 1)  Allegra-D 12 Hour 60-120 Mg Tb12 (Fexofenadine-Pseudoephedrine) .... Take 1 Tablet By Mouth Two Times A Day As Needed 2)  Restoril 15 Mg  Caps (Temazepam) .... Take 1 Tablet By Mouth Once A Day 3)  Gentamicin Sulfate 0.1 %  Crea (Gentamicin Sulfate) .... Use As Directed 4)  Diazepam 10 Mg Tabs (Diazepam) .... Take 1 Tablet By Mouth Three Times A Day 5)  Hydroxyzine Hcl 25 Mg  Tabs (Hydroxyzine Hcl) .... As Needed 6)  Valtrex 500 Mg  Tabs (Valacyclovir Hcl) .... As Needed 7)  Desoximetasone 0.05 %  Crea (Desoximetasone) .... Use As  Directed 8)  Epi Pen .... As Needed 9)  Albuterol Sulfate (2.5 Mg/69ml) 0.083%  Nebu (Albuterol Sulfate) .Marland Kitchen.. 1 Vial Every 4 Hours As Needed 10)  Singulair 10 Mg  Tabs (Montelukast Sodium) .... Take 1 Tablet By Mouth Once A Day 11)  Advair Diskus 500-50 Mcg/dose  Misc (Fluticasone-Salmeterol) .... Inhale 1 Puff Two Times A Day 12)  Ventolin Hfa 108 (90 Base) Mcg/act  Aers (Albuterol Sulfate) .... Inhale 2 Puffs Every Four Hours As Needed 13)  Nasonex 50 Mcg/act Susp (Mometasone Furoate) .... 2 Sprays in Each Nostril Daily 14)  Refresh Tears 0.5 % Soln (Carboxymethylcellulose Sodium) .Marland Kitchen.. 1  Drop in Each Eye Every 2 Hours 15)  Nexium 40 Mg  Cpdr (Esomeprazole Magnesium) .... By Mouth Daily. Take One Half Hour Before Eating. As Needed 16)  Omnipred 1 % Susp (Prednisolone Acetate) .... Three Times A Day in Right Eye 17)  Patanol 0.1 % Soln (Olopatadine Hcl) .... Two Drops Both Eye 18)  Zinc ? Mg .... Two Times A Day 19)  Calcium Adn Vit D ?mg .... Two Times A Day 20)  Delsym Night Time Multi-Sympt 5-6.25-10-325 Mg/24ml Liqd (Phenyleph-Doxylamine-Dm-Apap) .... 2 Tsp Every 3 Hours As Needed 21)  Trazodone Hcl 100 Mg Tabs (Trazodone Hcl) .... Take 1 Tablet By Mouth Once A Day 22)  Vitamin D 5000units .... Twice Weekly  Current Medications (verified): 1)  Allegra-D 12 Hour 60-120 Mg Tb12 (Fexofenadine-Pseudoephedrine) .... Take 1 Tablet By Mouth Two Times A Day As Needed 2)  Restoril 15 Mg  Caps (Temazepam) .... Take 1 Tablet By Mouth Once A Day 3)  Gentamicin Sulfate 0.1 %  Crea (Gentamicin Sulfate) .... Use As Directed 4)  Diazepam 10 Mg Tabs (Diazepam) .... Take 1 Tablet By Mouth Three Times A Day 5)  Hydroxyzine Hcl 25 Mg  Tabs (Hydroxyzine Hcl) .... As Needed 6)  Valtrex 500 Mg  Tabs (Valacyclovir Hcl) .... As Needed 7)  Desoximetasone 0.05 %  Crea (Desoximetasone) .... Use As Directed 8)  Epi Pen .... As Needed 9)  Albuterol Sulfate (2.5 Mg/43ml) 0.083%  Nebu (Albuterol Sulfate) .Marland Kitchen.. 1 Vial Every 4 Hours As Needed 10)  Singulair 10 Mg  Tabs (Montelukast Sodium) .... Take 1 Tablet By Mouth Once A Day 11)  Advair Diskus 500-50 Mcg/dose  Misc (Fluticasone-Salmeterol) .... Inhale 1 Puff Two Times A Day 12)  Ventolin Hfa 108 (90 Base) Mcg/act  Aers (Albuterol Sulfate) .... Inhale 2 Puffs Every Four Hours As Needed 13)  Nasonex 50 Mcg/act Susp (Mometasone Furoate) .... 2 Sprays in Each Nostril Daily 14)  Refresh Tears 0.5 % Soln (Carboxymethylcellulose Sodium) .Marland Kitchen.. 1 Drop in Each Eye Every 2 Hours 15)  Nexium 40 Mg  Cpdr (Esomeprazole Magnesium) .... By Mouth Daily. Take One Half Hour  Before Eating. As Needed 16)  Omnipred 1 % Susp (Prednisolone Acetate) .... Three Times A Day in Right Eye 17)  Patanol 0.1 % Soln (Olopatadine Hcl) .... Two Drops Both Eye 18)  Zinc ? Mg .... Two Times A Day 19)  Calcium Adn Vit D ?mg .... Two Times A Day 20)  Delsym Night Time Multi-Sympt 5-6.25-10-325 Mg/3ml Liqd (Phenyleph-Doxylamine-Dm-Apap) .... 2 Tsp Every 3 Hours As Needed 21)  Trazodone Hcl 100 Mg Tabs (Trazodone Hcl) .... Take 1 Tablet By Mouth Once A Day 22)  Vitamin D 5000units .... Twice Weekly  Allergies (verified): 1)  ! Codeine 2)  ! * Polysporin 3)  ! * Plastic Tape 4)  ! 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   Risk Factors: Smoking Status: never (05/10/2009)  Review of Systems      See HPI  Vital Signs:  Patient profile:   60 year old male Height:      71 inches Weight:      169.13 pounds BMI:     23.67 O2 Sat:      97 % on Room air Temp:     97.0 degrees F oral Pulse rate:   97 / minute BP sitting:   144 / 100  (left arm) Cuff size:   regular  Vitals Entered By: Boone Master CNA (May 28, 2009 12:00 PM)  O2 Flow:  Room air CC: 2 week follow up: still having SOB, wheezing, tightness in chest, cough occ producing yellow mucus, right ear discomfort.  states symptoms worsened again after finishing the abx around New Years. Is Patient Diabetic?  No Comments Medications reviewed with patient Daytime contact number verified with patient. 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 , THROAT-clear NECK:  Supple w/ fair ROM; no JVD; normal carotid impulses w/o bruits; no thyromegaly or nodules palpated; no lymphadenopathy. RESP  coarse BS w/ few rhonchi, no wheezing.  CARD:  RRR, no m/r/g   GI:   Soft & nt; nml bowel sounds; no organomegaly or masses detected. Musco: Warm bil,  no calf tenderness edema, clubbing, pulses intact Neuro: alert and oriented no focal deficits.  Skin: eczematous changes along face and neck.    Impression & Recommendations:  Problem # 1:  ASTHMA, PERSISTENT, SEVERE (ICD-493.90) Slow to resolve exacerbation w/ assoicated sinusitis Xray pending.,  REC;  Prednisone taper as directed.  Mucinex DM two times a day as needed cough/congestion Hydromet 1-2 tsp every 4-6 hrs as needed cough/congestion--may make you sleepy.  i will call with xray  Levaquin 750mg  once daily for 7 days Please contact office for sooner follow up if symptoms do not improve or worsen  follow up Dr. Delford Field in 2 weeks  His updated medication list for this problem includes:    Allegra-d 12 Hour 60-120 Mg Tb12 (Fexofenadine-pseudoephedrine) .Marland Kitchen... Take 1 tablet by mouth two times a day as needed    Nasonex 50 Mcg/act Susp (Mometasone furoate) .Marland Kitchen... 2 sprays in each nostril daily    Delsym Night Time Multi-sympt 5-6.25-10-325 Mg/22ml Liqd (Phenyleph-doxylamine-dm-apap) .Marland Kitchen... 2 tsp every 3 hours as needed    Levaquin 750 Mg Tabs (Levofloxacin) .Marland Kitchen... 1 by mouth once daily    Hydromet 5-1.5 Mg/78ml Syrp (Hydrocodone-homatropine) .Marland Kitchen... 1-2 tsp every 4-6 hrs as needed cough, may make you sleepy  Orders: T-2  View CXR ([id number])  Medications Added to Medication List This Visit: 1)  Prednisone 10 Mg Tabs (Prednisone) .... 4 tabs for 4 days, then 3 tabs for 4 days, 2  tabs for 4 days, then 1 tab for 4 days, then stop 2)  Levaquin 750 Mg Tabs (Levofloxacin) .Marland Kitchen.. 1 by mouth once daily 3)  Hydromet 5-1.5 Mg/28ml Syrp (Hydrocodone-homatropine) .Marland Kitchen.. 1-2 tsp every 4-6 hrs as needed cough, may make you sleepy  Complete Medication List: 1)  Allegra-d 12 Hour 60-120 Mg Tb12 (Fexofenadine-pseudoephedrine) .... Take 1 tablet by mouth two times a day as needed 2)  Restoril 15 Mg Caps (Temazepam) .... Take 1 tablet by mouth once a day 3)  Gentamicin Sulfate 0.1 % Crea (Gentamicin sulfate) .... Use as directed 4)  Diazepam 10 Mg Tabs (Diazepam) .... Take 1 tablet by mouth three times a day 5)  Hydroxyzine Hcl 25 Mg Tabs (Hydroxyzine hcl) .... As needed 6)  Valtrex 500 Mg Tabs (Valacyclovir hcl) .... As needed 7)  Desoximetasone 0.05 % Crea (Desoximetasone) .... Use as directed 8)  Epi Pen  .... As needed 9)  Albuterol Sulfate (2.5 Mg/23ml) 0.083% Nebu (Albuterol sulfate) .Marland Kitchen.. 1 vial every 4 hours as needed 10)  Singulair 10 Mg Tabs (Montelukast sodium) .... Take 1 tablet by mouth once a day 11)  Advair Diskus 500-50 Mcg/dose Misc (Fluticasone-salmeterol) .... Inhale 1 puff two times a day 12)  Ventolin Hfa 108 (90 Base) Mcg/act Aers (Albuterol sulfate) .... Inhale 2 puffs every four hours as needed 13)  Nasonex 50 Mcg/act Susp (Mometasone furoate) .... 2 sprays in each nostril daily 14)  Refresh Tears 0.5 % Soln (Carboxymethylcellulose sodium) .Marland Kitchen.. 1 drop in each eye every 2 hours 15)  Nexium 40 Mg Cpdr (Esomeprazole magnesium) .... By mouth daily. take one half hour before eating. as needed 16)  Omnipred 1 % Susp (Prednisolone acetate) .... Three times a day in right eye 17)  Patanol 0.1 % Soln (Olopatadine hcl) .... Two drops both eye 18)  Zinc ? Mg  .... Two times a day 19)  Calcium Adn Vit D ?mg  .... Two times  a day 20)  Delsym Night Time Multi-sympt 5-6.25-10-325 Mg/53ml Liqd (Phenyleph-doxylamine-dm-apap) .... 2 tsp every 3 hours as needed 21)  Trazodone Hcl 100 Mg Tabs (Trazodone hcl) .... Take 1 tablet by mouth once a day 22)  Vitamin D 5000units  .... Twice weekly 23)  Prednisone 10 Mg Tabs (Prednisone) .... 4 tabs for 4 days, then 3 tabs for 4 days, 2 tabs for 4 days, then 1 tab for 4 days, then stop 24)  Avelox 400 Mg Tabs (Moxifloxacin hcl) .... Take 1 tablet by mouth once a day x7days 25)  Hydromet 5-1.5 Mg/37ml Syrp (Hydrocodone-homatropine) .Marland Kitchen.. 1-2 tsp every 4-6 hrs as needed cough, may make you sleepy  Other Orders: Est. Patient Level IV (11914)  Patient Instructions: 1)  Prednisone taper as directed.  2)  Mucinex DM two times a day as needed cough/congestion 3)  Hydromet 1-2 tsp every 4-6 hrs as needed cough/congestion--may make you sleepy.  4)  i will call with xray  5)  Levaquin 750mg  once daily for 7 days 6)  Please contact office for sooner follow up if symptoms do not improve or worsen  7)  follow up Dr. Delford Field in 2 weeks  Prescriptions: HYDROMET 5-1.5 MG/5ML SYRP (HYDROCODONE-HOMATROPINE) 1-2 tsp every 4-6 hrs as needed cough, may make you sleepy  #8 oz x 0   Entered and Authorized by:   Rubye Oaks NP   Signed by:   Juniper Snyders NP on 05/28/2009   Method used:   Print then Give to Patient   RxID:  [id number] LEVAQUIN 750 MG TABS (LEVOFLOXACIN) 1 by mouth once daily  #7 x 0   Entered and Authorized by:   Rubye Oaks NP   Signed by:   Kioni Stahl NP on 05/28/2009   Method used:   Electronically to        [id number] (retail)       [address] [place], [place]  [address]       Ph: [phone]       Fax: [phone]   RxID:   [id number] PREDNISONE 10 MG TABS (PREDNISONE) 4 tabs for 4 days, then 3 tabs for 4 days, 2 tabs for 4 days, then 1 tab for 4 days, then stop  #40 x 0   Entered and Authorized by:   Rubye Oaks NP    Signed by:   Rubye Oaks NP on 05/28/2009   Method used:   Electronically to        [facility] # [id number] (retail)       [address] [place], [place]  [address]       Ph: [phone]       Fax: [phone]   RxID:   [id number]     Appended Document: Acute NP office visit - still SOB, cough i agree with this plan of care pw

## 2010-06-18 NOTE — Progress Notes (Signed)
Summary: re: pharmacy preferance  Phone Note Call from Patient Call back at Home Phone [phone]   Caller: Patient Call For: wright Summary of Call: pt wants nurse to change his "[facility]" [facility] [address] he says he spoke to his nurse at last ov but this didn't get changed.  Initial call taken by: Tivis Ringer, CNA,  March 14, 2010 10:39 AM  Follow-up for Phone Call        called spoke with patient who states that the last time he was seen his meds were sent to [facility] which is incorrect.  i informed pt that this must have been corrected either at the OV or when he called later that day about his nexium and clarinex b/c [facility] at the moment.  pt verbalized his understanding. Boone Master CNA/MA  March 14, 2010 11:13 AM

## 2010-06-18 NOTE — Progress Notes (Signed)
Summary: nos appt  Phone Note Call from Patient   Caller: [email or web address]  Call For: France Lusty Summary of Call: In ref to nos from 9/13, pt sch to see Tammy Parrett /15 @ 11:45a. Initial call taken by: Darletta Moll,  January 30, 2010 9:46 AM

## 2010-06-18 NOTE — Assessment & Plan Note (Signed)
Summary: Pulmonary OV   Primary Provider/Referring Provider:  Dorothyann Peng  CC:  Pt here for follow up. Pt states has "good days and bad days". Pt requesting flu vaccine.  History of Present Illness: 17 with known history of severe atopic asthma.    March 05, 2010 11:33 AM The pt had more wheezing and worse this fall.  The pt  has to clear throat constantly The astepro and pred helped .  No other new issues.     Asthma History    Asthma Control Assessment:    Age range: 12+ years    Symptoms: >2 days/week    Nighttime Awakenings: 0-2/month    Interferes w/ normal activity: some limitations    SABA use (not for EIB): 0-2 days/week    ATAQ questionnaire: 0    Exacerbations requiring oral systemic steroids: 0-1/year    Asthma Control Assessment: Not Well Controlled   Preventive Screening-Counseling & Management  Alcohol-Tobacco     Smoking Status: never  Current Medications (verified): 1)  Clarinex-D 12 Hour 2.5-120 Mg Xr12h-Tab (Desloratadine-Pseudoephedrine) .Marland Kitchen.. 1 By Mouth Two Times A Day As Needed Allergies 2)  Restoril 15 Mg  Caps (Temazepam) .... Take 1 Tablet By Mouth Once A Day As Needed Sleep 3)  Gentamicin Sulfate 0.1 %  Crea (Gentamicin Sulfate) .... Use As Directed 4)  Diazepam 10 Mg Tabs (Diazepam) .... Take 1 Tablet By Mouth Three Times A Day 5)  Hydroxyzine Hcl 25 Mg  Tabs (Hydroxyzine Hcl) .... As Needed 6)  Valtrex 500 Mg  Tabs (Valacyclovir Hcl) .... As Needed 7)  Desoximetasone 0.05 %  Crea (Desoximetasone) .... Use As Directed 8)  Epipen 2-Pak 0.3 Mg/0.29ml Devi (Epinephrine) .... As Needed 9)  Albuterol Sulfate (2.5 Mg/4ml) 0.083%  Nebu (Albuterol Sulfate) .Marland Kitchen.. 1 Vial Every 4 Hours As Needed 10)  Singulair 10 Mg  Tabs (Montelukast Sodium) .... Take 1 Tablet By Mouth Once A Day 11)  Advair Diskus 500-50 Mcg/dose  Misc (Fluticasone-Salmeterol) .... Inhale 1 Puff Two Times A Day 12)  Ventolin Hfa 108 (90 Base) Mcg/act  Aers (Albuterol Sulfate) ....  Inhale 2 Puffs Every Four Hours As Needed 13)  Nasonex 50 Mcg/act Susp (Mometasone Furoate) .... 2 Sprays in Each Nostril Daily 14)  Eye Wash  Soln (Ophthalmic Irrigation Solution) .... Every Two Hours 15)  Omnipred 1 % Susp (Prednisolone Acetate) .... Three Times A Day in Right Eye 16)  Patanol 0.1 % Soln (Olopatadine Hcl) .... Two Drops Both Eye 17)  Zinc 100 Mg Tabs (Zinc) .... Two Times A Day 18)  Calcium Carbonate-Vitamin D 600-400 Mg-Unit  Tabs (Calcium Carbonate-Vitamin D) .... Take 1 Tablet By Mouth Two Times A Day 19)  Delsym Night Time Multi-Sympt 5-6.25-10-325 Mg/54ml Liqd (Phenyleph-Doxylamine-Dm-Apap) .... 2 Tsp Every 3 Hours As Needed 20)  Trazodone Hcl 100 Mg Tabs (Trazodone Hcl) .... Take 1 Tablet By Mouth Once A Day As Needed Sleep 21)  Benicar 40 Mg Tabs (Olmesartan Medoxomil) .... Take 1 Tablet By Mouth Once A Day 22)  Zoloft 100 Mg Tabs (Sertraline Hcl) .... 2 Tabs By Mouth Once Daily 23)  Ibuprofen 800 Mg Tabs (Ibuprofen) .Marland Kitchen.. 1 Tab By Mouth Every 12 Hours 24)  Astelin 137 Mcg/spray Soln (Azelastine Hcl) .... 2 Puffs At Bedtime  Allergies (verified): 1)  ! Codeine 2)  ! * Polysporin 3)  ! * Plastic Tape 4)  ! Penicillin  Past History:  Past medical, surgical, family and social histories (including risk factors) reviewed, and no changes noted (  except as noted below).  Past Medical History: Reviewed history from 10/27/2008 and no changes required. 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 Mother- atopic allergies, asthma, deceased -CHF Father- deceased ? in mid 35s  Social History: Reviewed history from 10/27/2008 and no  changes required. 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   Review of Systems       The patient complains of shortness of breath with activity, non-productive cough, nasal congestion/difficulty breathing through nose, sneezing, and itching.  The patient denies shortness of breath at rest, productive cough, coughing up blood, chest pain, irregular heartbeats, acid heartburn, indigestion, loss of appetite, weight change, abdominal pain, difficulty swallowing, sore throat, tooth/dental problems, headaches, ear ache, anxiety, depression, hand/feet swelling, joint stiffness or pain, rash, change in color of mucus, and fever.    Vital Signs:  Patient profile:   60 year old male Height:      71 inches Weight:      166 pounds BMI:     23.24 O2 Sat:      97 % on Room air Temp:     97.8 degrees F oral Pulse rate:   75 / minute BP sitting:   130 / 90  (right arm) Cuff size:   regular  Vitals Entered By: Zackery Barefoot CMA (March 05, 2010 11:21 AM)  O2 Flow:  Room air CC: Pt here for follow up. Pt states has "good days and bad days". 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 RESP  coarse BS w/ few rhonchi, faint exp wheeze on forced expiration  CARD:  RRR, no m/r/g   GI:   Soft & nt; nml bowel sounds; no organomegaly or masses detected. Musco: Warm bil,  no calf tenderness edema, clubbing, pulses intact Neuro: alert and oriented no focal deficits.  Skin:  eczematous changes along face and neck.    Impression & Recommendations:  Problem # 1:  ASTHMA, PERSISTENT, SEVERE (ICD-493.90) Assessment Deteriorated severe persistent asthma with atopic features and chronic rhinitis and gerd driving triggers plan Dexilant one daily for acid reflux until samples gone Stay on Astepro daily Prednisone 10mg  4 each am x 4 days, 3 x 4 days, 2 x 4 days, 1 x 4 days then stop A depomedrol 120mg  IM injection will be given Return 2 months  Medications Added to Medication List This Visit: 1)  Prednisone 10 Mg Tabs (Prednisone) .... Take as directed 4 each am x 4 days, 3 x 4 days, 2 x 4 days, 1 x 4 days then stop 2)  Dexilant 60 Mg Cpdr (Dexlansoprazole) .... Take one by mouth daily until samples gone 3)  Eye Wash Soln (Ophthalmic irrigation solution) .... Every two hours 4)  Astepro 0.15 % Soln (Azelastine hcl) .... Two sprays  nightly 5)  Epipen 2-pak 0.3 Mg/0.39ml Devi (Epinephrine) .... As needed  Complete Medication List: 1)  Prednisone 10 Mg Tabs (Prednisone) .... Take as directed 4 each am x 4 days, 3 x 4 days, 2 x 4 days, 1 x 4 days then stop 2)  Dexilant 60 Mg Cpdr (Dexlansoprazole) .... Take one by mouth daily until samples gone 3)  Clarinex-d 12 Hour 2.5-120 Mg Xr12h-tab (Desloratadine-pseudoephedrine) .Marland Kitchen.. 1 by mouth two times a day as needed allergies 4)  Restoril 15 Mg Caps (Temazepam) .... Take 1 tablet by mouth once a day as needed sleep 5)  Diazepam 10 Mg Tabs (Diazepam) .... Take 1 tablet by mouth three times a day 6)  Singulair 10 Mg Tabs (Montelukast sodium) .... Take 1 tablet by mouth once a day 7)  Advair Diskus 500-50 Mcg/dose Misc (Fluticasone-salmeterol) .... Inhale 1 puff two times a day 8)  Nasonex 50 Mcg/act Susp (Mometasone furoate) .... 2 sprays in each nostril daily 9)  Eye Wash Soln (Ophthalmic irrigation solution) .... Every two hours 10)  Omnipred 1 % Susp (Prednisolone acetate) .... Three times a day in right eye 11)  Patanol  0.1 % Soln (Olopatadine hcl) .... Two drops both eye 12)  Zinc 100 Mg Tabs (Zinc) .... Two times a day 13)  Calcium Carbonate-vitamin D 600-400 Mg-unit Tabs (Calcium carbonate-vitamin d) .... Take 1 tablet by mouth two times a day 14)  Trazodone Hcl 100 Mg Tabs (Trazodone hcl) .... Take 1 tablet by mouth once a day as needed sleep 15)  Benicar 40 Mg Tabs (Olmesartan medoxomil) .... Take 1 tablet by mouth once a day 16)  Zoloft 100 Mg Tabs (Sertraline hcl) .... 2 tabs by mouth once daily 17)  Ibuprofen 800 Mg Tabs (Ibuprofen) .Marland Kitchen.. 1 tab by mouth every 12 hours 18)  Astepro 0.15 % Soln (Azelastine hcl) .... Two sprays nightly 19)  Epipen 2-pak 0.3 Mg/0.62ml Devi (Epinephrine) .... As needed 20)  Ventolin Hfa 108 (90 Base) Mcg/act Aers (Albuterol sulfate) .... Inhale 2 puffs every four hours as needed 21)  Delsym Night Time Multi-sympt 5-6.25-10-325 Mg/52ml Liqd (Phenyleph-doxylamine-dm-apap) .... 2 tsp every 3 hours as needed 22)  Valtrex 500 Mg Tabs (Valacyclovir hcl) .... As needed 23)  Hydroxyzine Hcl 25 Mg Tabs (Hydroxyzine hcl) .... As needed 24)  Gentamicin Sulfate 0.1 % Crea (Gentamicin sulfate) .... Use as directed 25)  Albuterol Sulfate (2.5 Mg/26ml) 0.083% Nebu (Albuterol sulfate) .Marland Kitchen.. 1 vial every 4 hours as needed 26)  Desoximetasone 0.05 % Crea (Desoximetasone) .... Use as directed 27)  Nexium 40 Mg Cpdr (Esomeprazole magnesium) .... Take 1 tablet by mouth once a day  Other Orders: Est. Patient Level IV (16109) Depo- Medrol 80mg  (J1040) Depo- Medrol 40mg  (J1030) Admin of Therapeutic Inj  intramuscular or subcutaneous (60454)  Patient Instructions: 1)  Dexilant one daily for acid reflux until samples gone 2)  Stay on Astepro daily 3)  Prednisone 10mg  4 each am x 4 days, 3 x 4 days, 2 x 4 days, 1 x 4 days then stop 4)  A depomedrol 120mg  IM injection will be given 5)  Return 2 months Prescriptions: PREDNISONE 10 MG  TABS (PREDNISONE) Take as directed 4 each am x 4 days, 3 x 4  days, 2 x 4 days, 1 x 4 days then stop  #40 x 0   Entered and Authorized by:   Storm Frisk MD   Signed by:   Storm Frisk MD on 03/05/2010   Method used:  Electronically to        [facility] [address] (retail)       [phone]       [place], [place]  [address]       Ph: [phone]       Fax: [phone]   RxID:   [id number]      Medication Administration  Injection # 1:    Medication: Depo- Medrol 80mg     Diagnosis: ALLERGIC RHINITIS (ICD-477.9)    Route: IM    Site: LUOQ gluteus    Exp Date: 08/2012    Lot #: [id number]    Mfr: Pharmacia    Patient tolerated injection without complications    Given by: Zackery Barefoot CMA (March 05, 2010 12:15 PM)  Injection # 2:    Medication: Depo- Medrol 40mg     Diagnosis: ALLERGIC RHINITIS (ICD-477.9)    Route: IM    Site: LUOQ gluteus    Exp Date: 08/2012    Lot #: [id number]    Mfr: Pharmacia    Patient tolerated injection without complications    Given by: Zackery Barefoot CMA (March 05, 2010 12:15 PM)  Orders Added: 1)  Est. Patient Level IV [70623] 2)  Depo- Medrol 80mg  [J1040] 3)  Depo- Medrol 40mg  [J1030] 4)  Admin of Therapeutic Inj  intramuscular or subcutaneous [96372]  Appended Document: Pulmonary OV fax Dorothyann Peng

## 2010-06-18 NOTE — Progress Notes (Signed)
Summary: CHANGE PHARMACY FYI  Phone Note From Pharmacy   [facility] [address] Call For: [facility] PARRETT  Summary of Call: [facility] [address]- PT ARRIVED TO PICK UP RX'S (PT WAS SEEN YESTERDAY). RX'S WERE SENT TO [facility] [address] IN ERROR PER PT. [facility]. I GAVE PHARMACIST THE RX'S VERBALLY AND AM SENDING THIS PER PT REQUEST/ RE: NAME OF PREFERRED PHARMACY FOR PT.  Initial call taken by: Tivis Ringer, CNA,  February 01, 2010 4:14 PM  Follow-up for Phone Call        noted.    Follow-up by: Boone Master CNA/MA,  February 01, 2010 4:18 PM

## 2010-06-18 NOTE — Progress Notes (Signed)
Summary: prescript for nexium and clarinex D  Phone Note Call from Patient   Caller: Patient Call For: wright Summary of Call: need nexium prescript called [facility] [address] [phone] Initial call taken by: Rickard Patience,  March 05, 2010 1:46 PM  Follow-up for Phone Call        Pt is requesting a [facility]ill for [facility] and [facility] I advised Nexium was not on pt med list so I will have to ask PW if ok to send. Pt states he has been on nexium for a while and that Dr. Delford Field started him on it. Dr. Delford Field please advise if ok to send rx for nexium. Carron Curie CMA  March 05, 2010 2:55 PM   Additional Follow-up for Phone Call Additional follow up Details #1::        it is ok to rx nexium, i had given him dexilant samples in office okf to refill clarinex D Additional Follow-up by: Storm Frisk MD,  March 05, 2010 3:30 PM    Additional Follow-up for Phone Call Additional follow up Details #2::    called and spoke with pt.  pt aware of PW's response and rx sent to pharmacy.  Aundra Millet Reynolds LPN  March 05, 2010 3:36 PM   New/Updated Medications: NEXIUM 40 MG  CPDR (ESOMEPRAZOLE MAGNESIUM) Take 1 tablet by mouth once a day Prescriptions: CLARINEX-D 12 HOUR 2.5-120 MG XR12H-TAB (DESLORATADINE-PSEUDOEPHEDRINE) 1 by mouth two times a day as needed allergies  #60 x 11   Entered by:   Arman Filter LPN   Authorized by:   Storm Frisk MD   Signed by:   Arman Filter LPN on 81/19/1478   Method used:   [phone][facility] (retail)       [address] [place], [place]  [address]       Ph: [phone]       Fax: [phone]   RxID:   [id number] NEXIUM 40 MG  CPDR (ESOMEPRAZOLE MAGNESIUM) Take 1 tablet by mouth once a day  #30 x 3   Entered by:   Arman Filter LPN   Authorized by:   Storm Frisk MD   Signed by:   Arman Filter LPN on 25/95/6387   Method used:   [facility]        [facility] # [phone][facility] (retail)       [address] [place], [place]  [address]       Ph: [phone]       Fax: [phone]   RxID:   [id number]

## 2010-06-18 NOTE — Progress Notes (Signed)
Summary: clarrification on samples of Clarinex D  Phone Note Call from Patient   Caller: Patient Call For: PARRETT Summary of Call: PT HAVE QUESTIONS ABOUT CLAIRINEX SAMPLES HE RECEIVED TODAY Initial call taken by: Rickard Patience,  January 31, 2010 2:00 PM  Follow-up for Phone Call        Called, spoke with pt.  Pt states he is supposed to be taking clarinex d 12h two times a day.  He was given samples of clarinex d 24h today at OV with TP.  Would like to know if he needs to take the samples once or twice daily bc they are 24h.  Advised he should take the clarinex d 24h samples once daily but when he picks up rx from pharmacy he needs to take those two times a day bc they will be the clarinex d 12h.  He verbalized understanding of this.   Follow-up by: Gweneth Dimitri RN,  January 31, 2010 2:13 PM

## 2010-07-16 ENCOUNTER — Encounter: Payer: Self-pay | Admitting: Adult Health

## 2010-07-16 ENCOUNTER — Encounter (INDEPENDENT_AMBULATORY_CARE_PROVIDER_SITE_OTHER): Payer: Medicaid Other | Admitting: Adult Health

## 2010-07-16 DIAGNOSIS — R000 Tachycardia, unspecified: Secondary | ICD-10-CM | POA: Insufficient documentation

## 2010-07-16 DIAGNOSIS — J018 Other acute sinusitis: Secondary | ICD-10-CM

## 2010-07-16 NOTE — Progress Notes (Signed)
  Subjective:    Patient ID: [name] [name], male    DOB: 1950-09-06, 60 y.o.   MRN: [id number]  HPI    Review of Systems     Objective:   Physical Exam        Assessment & Plan:   This encounter was created in error - please disregard.

## 2010-07-19 ENCOUNTER — Telehealth: Payer: Self-pay | Admitting: Critical Care Medicine

## 2010-07-19 ENCOUNTER — Telehealth (INDEPENDENT_AMBULATORY_CARE_PROVIDER_SITE_OTHER): Payer: Self-pay | Admitting: *Deleted

## 2010-07-22 ENCOUNTER — Telehealth (INDEPENDENT_AMBULATORY_CARE_PROVIDER_SITE_OTHER): Payer: Self-pay | Admitting: *Deleted

## 2010-07-25 NOTE — Progress Notes (Signed)
Summary: Levaquin is not covered<<<changed to Avelox  Phone Note Outgoing Call   Call placed by: Michel Bickers CMA,  July 19, 2010 10:22 AM Call placed to: [facility] [phone] Summary of Call: PA initiated for Levofloxacin 500mg  tabs.  Member # is [id number] S.  Preferred medications are Avelox, Cipro and Floxin. Would one of the preferred meds be appropriate for this patient? Please advise.   Initial call taken by: Michel Bickers CMA,  July 19, 2010 10:34 AM  Follow-up for Phone Call        Avelox 400mg  once daily x 10 day #10, no refills  Follow-up by: Rubye Oaks NP,  July 19, 2010 10:51 AM  Additional Follow-up for Phone Call Additional follow up Details #1::        Pt aware of new RX for Avelox and new RX sent to the pharmacy.Michel Bickers CMA  July 19, 2010 11:16 AM    New/Updated Medications: AVELOX 400 MG TABS (MOXIFLOXACIN HCL) 1 by mouth daily for 10 days Prescriptions: AVELOX 400 MG TABS (MOXIFLOXACIN HCL) 1 by mouth daily for 10 days  #10 x 0   Entered by:   Michel Bickers CMA   Authorized by:   Rubye Oaks NP   Signed by:   Michel Bickers CMA on 07/19/2010   Method used:   [facility]ronically to        [id number] (retail)       [address] [place], [place]  [address]       Ph: [phone]       Fax: [phone]   RxID:   [id number]

## 2010-07-25 NOTE — Progress Notes (Signed)
Summary: refillson advair, ventolin, and singulair  Phone Note Call from Patient Call back at Home Phone [phone]   Caller: Patient Call For: wright Summary of Call: pt needs refills of advair, singulair (which pt says needs prior auth and "all the meds prescribed by dr Delford Field". [facility] [address]  Initial call taken by: Tivis Ringer, CNA,  July 19, 2010 2:59 PM  Follow-up for Phone Call        [facility] Vernie Murders  July 19, 2010 3:14 PM  Spoke with the pt and he states he needs refill on singulair, ventolin, and advair. I advised I will send rx.Jennifer Yancey Flemings CMA  July 19, 2010 4:42 PM     Prescriptions: VENTOLIN HFA 108 (90 BASE) MCG/ACT  AERS (ALBUTEROL SULFATE) Inhale 2 puffs every four hours as needed  #1 x 5   Entered by:   Carron Curie CMA   Authorized by:   Storm Frisk MD   Signed by:   Carron Curie CMA on 07/19/2010   Method used:   [facility] [address] [facility] [phone]* (retail)       [address] [place], [place]  [address]       Ph: [phone]       Fax: [phone]   RxID:   [id number] ADVAIR DISKUS 500-50 MCG/DOSE  MISC (FLUTICASONE-SALMETEROL) Inhale 1 puff two times a day  #1 x 5   Entered by:   Carron Curie CMA   Authorized by:   Storm Frisk MD   Signed by:   Carron Curie CMA on 07/19/2010   Method used:   [facility]ically to        [facility] [address] [facility] [phone]* (retail)       [address] [place], [place]  [address]       Ph: [phone]       Fax: [phone]   RxID:   [id number] SINGULAIR 10 MG  TABS (MONTELUKAST SODIUM) Take 1 tablet by mouth once a day  #30 Tablet x 5   Entered by:   Carron Curie CMA   Authorized by:   Storm Frisk MD   Signed by:   Carron Curie CMA on 07/19/2010   Method used:   [facility]ronically to        [facility] [address] # [phone]* (retail)       [address] [place], [place]  [address]       Ph: [phone]       Fax:  [phone]   RxID:   [id number]

## 2010-07-25 NOTE — Assessment & Plan Note (Signed)
Summary: Acute NP office visit -  asthma   Primary Provider/Referring Provider:  Dorothyann Peng  CC:  wheezing esp at night, increased SOB, PND w/ prod cough with yellow mucus, HA, and sinus congestion x11month - denies f/c/s.  History of Present Illness: 40 with known history of severe atopic asthma.    March 05, 2010 -The pt had more wheezing and worse this fall.  The pt  has to clear throat constantly The astepro and pred helped .  No other new issues.  July 16, 2010 --Presents for an acute office visit.Complains of increased dyspnea, wheezing esp at night, increased SOB, PND w/ prod cough with yellow mucus, HA, sinus congestion x35month. Lots of sinus congestion, headache,sinus pressure. Taking advil 800mg . Today heart rate is up he says he has been taking decongestants and  using his albuterol. NO chest pain or palpitations.  Denies chest pain,  orthopnea, hemoptysis, fever, n/v/d, edema.     Medications Prior to Update: 1)  Clarinex-D 12 Hour 2.5-120 Mg Xr12h-Tab (Desloratadine-Pseudoephedrine) .Marland Kitchen.. 1 By Mouth Two Times A Day As Needed Allergies 2)  Restoril 15 Mg  Caps (Temazepam) .... Take 1 Tablet By Mouth Once A Day As Needed Sleep 3)  Diazepam 10 Mg Tabs (Diazepam) .... Take 1 Tablet By Mouth Three Times A Day 4)  Singulair 10 Mg  Tabs (Montelukast Sodium) .... Take 1 Tablet By Mouth Once A Day 5)  Advair Diskus 500-50 Mcg/dose  Misc (Fluticasone-Salmeterol) .... Inhale 1 Puff Two Times A Day 6)  Nasonex 50 Mcg/act Susp (Mometasone Furoate) .... 2 Sprays in Each Nostril Daily 7)  Eye Wash  Soln (Ophthalmic Irrigation Solution) .... Every Two Hours 8)  Omnipred 1 % Susp (Prednisolone Acetate) .... Three Times A Day in Right Eye 9)  Patanol 0.1 % Soln (Olopatadine Hcl) .... Two Drops Both Eye 10)  Zinc 100 Mg Tabs (Zinc) .... Two Times A Day 11)  Calcium Carbonate-Vitamin D 600-400 Mg-Unit  Tabs (Calcium Carbonate-Vitamin D) .... Take 1 Tablet By Mouth Two Times A Day 12)   Trazodone Hcl 100 Mg Tabs (Trazodone Hcl) .... Take 1 Tablet By Mouth Once A Day As Needed Sleep 13)  Benicar 40 Mg Tabs (Olmesartan Medoxomil) .... Take 1 Tablet By Mouth Once A Day 14)  Zoloft 100 Mg Tabs (Sertraline Hcl) .... 2 Tabs By Mouth Once Daily 15)  Ibuprofen 800 Mg Tabs (Ibuprofen) .Marland Kitchen.. 1 Tab By Mouth Every 12 Hours 16)  Astepro 0.15 % Soln (Azelastine Hcl) .... Two Sprays Nightly 17)  Epipen 2-Pak 0.3 Mg/0.32ml Devi (Epinephrine) .... As Needed 18)  Ventolin Hfa 108 (90 Base) Mcg/act  Aers (Albuterol Sulfate) .... Inhale 2 Puffs Every Four Hours As Needed 19)  Delsym Night Time Multi-Sympt 5-6.25-10-325 Mg/9ml Liqd (Phenyleph-Doxylamine-Dm-Apap) .... 2 Tsp Every 3 Hours As Needed 20)  Valtrex 500 Mg  Tabs (Valacyclovir Hcl) .... As Needed 21)  Hydroxyzine Hcl 25 Mg  Tabs (Hydroxyzine Hcl) .... As Needed 22)  Gentamicin Sulfate 0.1 %  Crea (Gentamicin Sulfate) .... Use As Directed 23)  Albuterol Sulfate (2.5 Mg/75ml) 0.083%  Nebu (Albuterol Sulfate) .Marland Kitchen.. 1 Vial Every 4 Hours As Needed 24)  Desoximetasone 0.05 %  Crea (Desoximetasone) .... Use As Directed 25)  Nexium 40 Mg  Cpdr (Esomeprazole Magnesium) .... Take 1 Tablet By Mouth Once A Day  Current Medications (verified): 1)  Clarinex-D 12 Hour 2.5-120 Mg Xr12h-Tab (Desloratadine-Pseudoephedrine) .Marland Kitchen.. 1 By Mouth Two Times A Day As Needed Allergies 2)  Restoril 15 Mg  Caps (  Temazepam) .... Take 1 Tablet By Mouth Once A Day As Needed Sleep 3)  Diazepam 10 Mg Tabs (Diazepam) .... Take 1 Tablet By Mouth Three Times A Day 4)  Singulair 10 Mg  Tabs (Montelukast Sodium) .... Take 1 Tablet By Mouth Once A Day 5)  Advair Diskus 500-50 Mcg/dose  Misc (Fluticasone-Salmeterol) .... Inhale 1 Puff Two Times A Day 6)  Nasonex 50 Mcg/act Susp (Mometasone Furoate) .... 2 Sprays in Each Nostril Daily 7)  Eye Wash  Soln (Ophthalmic Irrigation Solution) .... Every Two Hours 8)  Omnipred 1 % Susp (Prednisolone Acetate) .... Three Times A Day in Right  Eye 9)  Patanol 0.1 % Soln (Olopatadine Hcl) .... Two Drops Both Eye 10)  Zinc 100 Mg Tabs (Zinc) .... Two Times A Day 11)  Calcium Carbonate-Vitamin D 600-400 Mg-Unit  Tabs (Calcium Carbonate-Vitamin D) .... Take 1 Tablet By Mouth Two Times A Day 12)  Trazodone Hcl 100 Mg Tabs (Trazodone Hcl) .... Take 1 Tablet By Mouth Once A Day As Needed Sleep 13)  Benicar 40 Mg Tabs (Olmesartan Medoxomil) .... Take 1 Tablet By Mouth Once A Day 14)  Zoloft 100 Mg Tabs (Sertraline Hcl) .... 2 Tabs By Mouth Once Daily 15)  Ibuprofen 800 Mg Tabs (Ibuprofen) .Marland Kitchen.. 1 Tab By Mouth Every 12 Hours 16)  Astepro 0.15 % Soln (Azelastine Hcl) .... Two Sprays Nightly 17)  Epipen 2-Pak 0.3 Mg/0.76ml Devi (Epinephrine) .... As Needed 18)  Ventolin Hfa 108 (90 Base) Mcg/act  Aers (Albuterol Sulfate) .... Inhale 2 Puffs Every Four Hours As Needed 19)  Delsym Night Time Multi-Sympt 5-6.25-10-325 Mg/40ml Liqd (Phenyleph-Doxylamine-Dm-Apap) .... 2 Tsp Every 3 Hours As Needed 20)  Valtrex 500 Mg  Tabs (Valacyclovir Hcl) .... As Needed 21)  Hydroxyzine Hcl 25 Mg  Tabs (Hydroxyzine Hcl) .... As Needed 22)  Gentamicin Sulfate 0.1 %  Crea (Gentamicin Sulfate) .... Use As Directed 23)  Albuterol Sulfate (2.5 Mg/72ml) 0.083%  Nebu (Albuterol Sulfate) .Marland Kitchen.. 1 Vial Every 4 Hours As Needed 24)  Desoximetasone 0.05 %  Crea (Desoximetasone) .... Use As Directed 25)  Nexium 40 Mg  Cpdr (Esomeprazole Magnesium) .... Take 1 Tablet By Mouth Once A Day  Allergies (verified): 1)  ! Codeine 2)  ! * Polysporin 3)  ! * Plastic Tape 4)  ! 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 in mid 61s  Social History: Last updated: 07/16/2010 blind - live alone  Single 1 child  former smoker.   Risk Factors: Smoking Status: never (03/05/2010)  Social History: blind - live alone  Single 1 child  former smoker.   Review of Systems      See HPI  Vital Signs:  Patient profile:   60 year old male Height:      71 inches Weight:      162 pounds BMI:     22.68 O2 Sat:      94 % on Room air Temp:     97.0 degrees F oral Pulse rate:   109 / minute BP sitting:   124 / 82  (left arm) Cuff size:   regular  Vitals Entered By: Boone Master CNA/MA (July 16, 2010 2:34  PM)  O2 Flow:  Room air CC: wheezing esp at night, increased SOB, PND w/ prod cough with yellow mucus, HA, sinus congestion x39month - denies f/c/s Is Patient Diabetic? No Comments Medications reviewed with patient Daytime contact number verified with patient. 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 RESP  CTA w/no wheezing  CARD:  RRR, no m/r/g   GI:   Soft & nt; nml bowel sounds; no organomegaly or masses detected. Musco: Warm bil,  no calf tenderness edema, clubbing, pulses intact Neuro: alert and oriented no focal deficits.  Skin: eczematous changes along face and neck.    Impression & Recommendations:  Problem # 1:  OTHER ACUTE SINUSITIS (ICD-461.8)  Levaquin 500mg  once daily for 10 days Mucinex DM two times a day as needed cough/congestion Saline nasal rinses  Hold Claritin until done with Levaquin  Please contact office for sooner follow  up if symptoms do not improve or worsen  follow up Dr. Delford Field in 3-4 weeks   His updated medication list for this problem includes:    Clarinex-d 12 Hour 2.5-120 Mg Xr12h-tab (Desloratadine-pseudoephedrine) .Marland Kitchen... 1 by mouth two times a day as needed allergies    Nasonex 50 Mcg/act Susp (Mometasone furoate) .Marland Kitchen... 2 sprays in each nostril daily    Astepro 0.15 % Soln (Azelastine hcl) .Marland Kitchen..Marland Kitchen Two sprays nightly    Delsym Night Time Multi-sympt 5-6.25-10-325 Mg/24ml Liqd (Phenyleph-doxylamine-dm-apap) .Marland Kitchen... 2 tsp every 3 hours as needed    Levaquin 500 Mg Tabs (Levofloxacin) .Marland Kitchen... 1 by mouth once daily  Orders: Est. Patient Level IV (16109)  Problem # 2:  TACHYCARDIA (ICD-785.0)  HR elevated w/no associated symptoms  suspect secondary to decongestants and rescue inhalers.   Orders: Est. Patient Level IV (60454)  Medications Added to Medication List This Visit: 1)  Desoximetasone 0.05 % Crea (Desoximetasone) .... Use as directed 2)  Levaquin 500 Mg Tabs (Levofloxacin) .Marland Kitchen.. 1 by mouth once daily  Patient Instructions: 1)  Levaquin 500mg  once daily for 10 days 2)  Mucinex DM two times a day as needed cough/congestion 3)  Saline nasal rinses  4)  Hold Claritin until done with Levaquin  5)  Please contact office for sooner follow up if symptoms do not improve or worsen  6)  follow up Dr. Delford Field in 3-4 weeks 7)    Prescriptions: LEVAQUIN 500 MG TABS (LEVOFLOXACIN) 1 by mouth once daily  #10 x 0   Entered and Authorized by:   Rubye Oaks NP   Signed by:   Rubye Oaks NP on 07/16/2010   Method used:   Electronically to        [facility] # [phone]* (retail)       [address] [place], [place]  [address]       Ph: [phone]       Fax: [phone]   RxID:   [id number]    Immunization History:  Influenza Immunization History:    Influenza:  historical (03/19/2010)   Appended Document: Acute NP office visit -  asthma ekg with no acute changes noted,  reviewed with pt

## 2010-07-30 NOTE — Progress Notes (Signed)
Summary: Advair and Singulair PA --- APPROVED  Phone Note Outgoing Call   Call placed by: Michel Bickers CMA,  July 22, 2010 11:43 AM Call placed to: [facility] [phone] Summary of Call: PA initiated for Advair 500-50 and Singulair 10mg .  Member ID # [id number] S.   Advair Diskus APPROVED x2 years, 07/22/2010 to 07/21/2012. Singulair APPROVED x1 year, 07/22/2010 to 07/22/2011. [facility] [address] is aware.  Pt also aware. Initial call taken by: Michel Bickers CMA,  July 22, 2010 11:48 AM

## 2010-07-31 ENCOUNTER — Encounter: Payer: Self-pay | Admitting: Adult Health

## 2010-08-06 ENCOUNTER — Encounter: Payer: Self-pay | Admitting: Critical Care Medicine

## 2010-08-06 ENCOUNTER — Ambulatory Visit (INDEPENDENT_AMBULATORY_CARE_PROVIDER_SITE_OTHER): Payer: Medicaid Other | Admitting: Critical Care Medicine

## 2010-08-06 DIAGNOSIS — K219 Gastro-esophageal reflux disease without esophagitis: Secondary | ICD-10-CM

## 2010-08-06 DIAGNOSIS — J45909 Unspecified asthma, uncomplicated: Secondary | ICD-10-CM

## 2010-08-06 MED ORDER — PREDNISONE 10 MG PO TABS: 10.0000 mg | ORAL_TABLET | Freq: Every day | ORAL | Status: DC

## 2010-08-06 MED ORDER — PREDNISONE 10 MG PO TABS: ORAL_TABLET | ORAL | Status: DC

## 2010-08-06 NOTE — Assessment & Plan Note (Signed)
Severe persistent asthma with flare and recent tracheobronchitis that is now better but ongoing airway inflammation Plan Pulse prednisone  10mg   Take 4 for four days, 3 for four days, 2 for four days, 1 for four days and stop Continue inhaled meds as prescribed

## 2010-08-06 NOTE — Patient Instructions (Signed)
Take prednisone 10mg  Take 4 for four days, 3 for four days, 2 for four days, 1 for four days and stop  Sent to [facility] No change in other medications Return 6 weeks

## 2010-08-06 NOTE — Progress Notes (Addendum)
Subjective:    Patient ID: [name] [name], male    DOB: 1951/04/03, 60 y.o.   MRN: [id number]  Cough This is a recurrent problem. The current episode started 1 to 4 weeks ago. The problem has been waxing and waning. The problem occurs every few minutes. The cough is productive of sputum. Associated symptoms include ear congestion, ear pain, heartburn, nasal congestion, postnasal drip, rhinorrhea, a sore throat, shortness of breath and wheezing. Pertinent negatives include no chest pain, chills, fever, headaches, hemoptysis, myalgias, rash, sweats or weight loss. The symptoms are aggravated by dust, exercise, pollens and stress. His past medical history is significant for asthma, bronchitis and environmental allergies.   History of Present Illness:  14 with known history of severe atopic asthma.  March 05, 2010 -The pt had more wheezing and worse this fall. The pt has to clear throat constantly  The astepro and pred helped . No other new issues.  July 16, 2010 --Presents for an acute office visit.Complains of increased dyspnea, wheezing esp at night, increased SOB, PND w/ prod cough with yellow mucus, HA, sinus congestion x26month. Lots of sinus congestion, headache,sinus pressure. Taking advil 800mg . Today heart rate is up he says he has been taking decongestants and using his albuterol. NO chest pain or palpitations. Denies chest pain, orthopnea, hemoptysis, fever, n/v/d, edema.    08/06/2010 At last ov we Rec: 1) Levaquin 500mg  once daily for 10 days  2) Mucinex DM two times a day as needed cough/congestion  3) Saline nasal rinses  4) Hold Claritin until done with Levaquin   Since last ov,  Symptoms are the same,  Dark yellow is better.  Still congested ,  However now, R side is caved in on sinus and closed off and  has to have pndrp for this  Pt denies any significant sore throat, fever, chills, sweats, unintended weight loss, pleurtic or exertional chest pain, orthopnea PND, or leg  swelling Pt denies any increase in rescue therapy over baseline, denies waking up needing it or having any early am or nocturnal exacerbations of coughing/wheezing/or dyspnea. Pt also denies any obvious fluctuation in symptoms with  weather or environmental change or other alleviating or aggravating factors  Had to use neb more but now is less.   Past Medical History  Diagnosis Date  . Legal blindness, as defined in [place]   . Shortness of breath   . Cough   . Esophageal reflux   . Osteopenia   . Personal history of other diseases of digestive system   . Contact dermatitis and other eczema, due to unspecified cause   . Unspecified sinusitis (chronic)   . Allergic rhinitis   . Unspecified asthma     Family History  Problem Relation Age of Onset  . Allergies Mother     atopic  . Asthma Mother   . Heart failure Mother     CHF  . Other Father     History   Social History  . Marital Status: Single    Spouse Name: N/A    Number of Children: 1  . Years of Education: N/A   Occupational History  . does not work    Social History Main Topics  . Smoking status: Former Smoker    Types: Cigarettes  . Smokeless tobacco: Not on file  . Alcohol Use: Yes     once year  . Drug Use: No  . Sexually Active: Not on file   Other Topics Concern  .  Not on file   Social History Narrative   Lives aloneBlind    Allergies  Allergen Reactions  . Bacitracin-Polymyxin B   . Codeine   . Penicillins     Outpatient Encounter Prescriptions as of 08/06/2010  Medication Sig Dispense Refill  . albuterol (PROVENTIL) (2.5 MG/3ML) 0.083% nebulizer solution Take by nebulization every 4 (four) hours as needed.        Marland Kitchen albuterol (VENTOLIN HFA) 108 (90 BASE) MCG/ACT inhaler Inhale 2 puffs into the lungs every 4 (four) hours as needed.        Marland Kitchen azelastine (ASTELIN) 137 MCG/SPRAY nasal spray 1 spray by Nasal route at bedtime.        . Calcium Carbonate-Vitamin D (CALCIUM 600+D) 600-400 MG-UNIT per  tablet Take 1 tablet by mouth 2 (two) times daily.        Marland Kitchen desloratadine-pseudoephedrine (CLARINEX-D 12-HOUR) 2.5-120 MG per tablet Take 1 tablet by mouth 2 (two) times daily as needed.        . diazepam (VALIUM) 10 MG tablet Take by mouth 3 (three) times daily.        Marland Kitchen EPINEPHrine (EPIPEN 2-PAK) 0.3 mg/0.3 mL DEVI as needed for sever allergic reaction       . esomeprazole (NEXIUM) 40 MG capsule Take by mouth daily before breakfast.        . Fluticasone-Salmeterol (ADVAIR DISKUS) 500-50 MCG/DOSE AEPB Inhale 1 puff into the lungs every 12 (twelve) hours.        . hydrOXYzine (ATARAX) 25 MG tablet Take by mouth 3 (three) times daily as needed.        Marland Kitchen ibuprofen (ADVIL,MOTRIN) 800 MG tablet Take by mouth every 12 (twelve) hours as needed.        . mometasone (NASONEX) 50 MCG/ACT nasal spray 2 sprays by Nasal route daily.        . montelukast (SINGULAIR) 10 MG tablet Take by mouth daily.        Marland Kitchen olmesartan (BENICAR) 40 MG tablet Take by mouth daily.        Marland Kitchen olopatadine (PATANOL) 0.1 % ophthalmic solution Place 2 drops into both eyes 2 (two) times daily.        Marland Kitchen Ophthalmic Irrigation Solution (EYE WASH) SOLN Every 2 hours       . Phenyleph-Doxylamine-DM-APAP (DELSYM NIGHT TIME MULTI-SYMPT) 5-6.25-10-325 MG/15ML LIQD 2 teaspoons by mouth every 3 hours as needed       . prednisoLONE acetate (PRED FORTE) 1 % ophthalmic suspension Place 1 drop into the right eye 3 (three) times daily.        . sertraline (ZOLOFT) 100 MG tablet 2 tabs by mouth once daily       . temazepam (RESTORIL) 15 MG capsule Take by mouth at bedtime as needed.        . traZODone (DESYREL) 100 MG tablet Take by mouth at bedtime as needed.        . valACYclovir (VALTREX) 500 MG tablet as needed.        . predniSONE (DELTASONE) 10 MG tablet Take 4 for four days 3 for four days 2 for four days 1 for four days and stop    40 tablet  0  . Zinc 100 MG TABS Take by mouth 2 (two) times daily.        Marland Kitchen DISCONTD: desoximetasone  (TOPICORT) 0.05 % cream Use as directed       . DISCONTD: predniSONE (DELTASONE) 10 MG tablet Take 1 tablet (10 mg total)  by mouth daily. Take 4 for four days, 3 for four days, 2 for four days, 1 for four days and stop    40 tablet  0     Review of Systems  Constitutional: Negative for fever, chills and weight loss.  HENT: Positive for ear pain, sore throat, rhinorrhea and postnasal drip.   Respiratory: Positive for cough, shortness of breath and wheezing. Negative for hemoptysis.   Cardiovascular: Negative for chest pain.  Gastrointestinal: Positive for heartburn.  Musculoskeletal: Negative for myalgias.  Skin: Negative for rash.  Neurological: Negative for headaches.  Hematological: Positive for environmental allergies.     Constitutional:   No  weight loss, night sweats,  Fevers, chills, fatigue, lassitude. HEENT:   Severe  Headaches, back and front of head and eye sockets,  Difficulty swallowing,  Tooth/dental problems,  Sore throat,                Notes  sneezing, itching, L  ear ache, nasal congestion,severe  post nasal drip,   CV:  No chest pain,  Orthopnea, PND, swelling in lower extremities, anasarca, dizziness, palpitations  GI  No heartburn, indigestion, abdominal pain, nausea, vomiting, diarrhea, change in bowel habits, loss of appetite  Resp: Still  shortness of breath with exertion not   at rest.  No excess mucus, notes  productive cough,  Notes  non-productive cough,  No coughing up of blood.  No change in color of mucus.  Notes some  wheezing.  No chest wall deformity  Skin: no rash or lesions.  GU: no dysuria, change in color of urine, no urgency or frequency.  No flank pain.  MS:  No joint pain or swelling.  No decreased range of motion.  No back pain.  Psych:  No change in mood or affect. No depression or anxiety.  No memory loss.                       Objective:   Physical Exam    Gen: Pleasant, well-nourished, in no distress,  normal  affect  ENT: No lesions,  mouth clear,  oropharynx clear, moderate  postnasal drip  Neck: No JVD, no TMG, no carotid bruits  Lungs: No use of accessory muscles, no dullness to percussion, insp/exp wheezes   Cardiovascular: RRR, heart sounds normal, no murmur or gallops, no peripheral edema  Abdomen: soft and NT, no HSM,  BS normal  Musculoskeletal: No deformities, no cyanosis or clubbing  Neuro: alert, non focal  Skin: Warm, no lesions or rashes    No images are attached to the encounter.     Assessment & Plan:  Esophageal reflux Severe GERD  Plan: Continue nexium as prescribed   ASTHMA, PERSISTENT, SEVERE Severe persistent asthma with flare and recent tracheobronchitis that is now better but ongoing airway inflammation Plan Pulse prednisone  10mg   Take 4 for four days, 3 for four days, 2 for four days, 1 for four days and stop Continue inhaled meds as prescribed

## 2010-08-06 NOTE — Assessment & Plan Note (Signed)
Severe GERD  Plan: Continue nexium as prescribed

## 2010-08-07 ENCOUNTER — Encounter: Payer: Self-pay | Admitting: Critical Care Medicine

## 2010-08-07 NOTE — Progress Notes (Signed)
This encounter was created in error - please disregard.

## 2010-08-07 NOTE — Progress Notes (Deleted)
Subjective:    Patient ID: [name] [name], male    DOB: Oct 04, 1950, 60 y.o.   MRN: [id number]  HPI    Review of Systems  Constitutional: Negative for fever, chills, diaphoresis, activity change, appetite change, fatigue and unexpected weight change.  HENT: Negative for hearing loss, ear pain, nosebleeds, congestion, sore throat, facial swelling, rhinorrhea, sneezing, mouth sores, trouble swallowing, neck pain, neck stiffness, dental problem, voice change, postnasal drip, sinus pressure, tinnitus and ear discharge.   Eyes: Negative for photophobia, discharge, itching and visual disturbance.  Respiratory: Negative for apnea, cough, choking, chest tightness, shortness of breath, wheezing and stridor.   Cardiovascular: Negative for chest pain, palpitations and leg swelling.  Gastrointestinal: Negative for nausea, vomiting, abdominal pain, constipation, blood in stool and abdominal distention.  Genitourinary: Negative for dysuria, urgency, frequency, hematuria, flank pain, decreased urine volume and difficulty urinating.  Musculoskeletal: Negative for myalgias, back pain, joint swelling, arthralgias and gait problem.  Skin: Negative for color change, pallor and rash.  Neurological: Negative for dizziness, tremors, seizures, syncope, speech difficulty, weakness, light-headedness, numbness and headaches.  Hematological: Negative for adenopathy. Does not bruise/bleed easily.  Psychiatric/Behavioral: Negative for confusion, sleep disturbance and agitation. The patient is not nervous/anxious.        Objective:   Physical Exam  Constitutional: Vital signs are normal. He appears well-developed and well-nourished. He is active.  Non-toxic appearance. He does not appear ill. No distress.  HENT:  Head: Normocephalic and atraumatic.  Nose: No mucosal edema, rhinorrhea, sinus tenderness, nasal deformity or septal deviation. No epistaxis. Right sinus exhibits no maxillary sinus tenderness and no  frontal sinus tenderness. Left sinus exhibits no maxillary sinus tenderness and no frontal sinus tenderness.  Mouth/Throat: Oropharynx is clear and moist. No oropharyngeal exudate.  Eyes: Conjunctivae and EOM are normal. Pupils are equal, round, and reactive to light. Right eye exhibits no discharge. Left eye exhibits no discharge. No scleral icterus.  Neck: Normal range of motion. Neck supple. Normal carotid pulses, no hepatojugular reflux and no JVD present. No tracheal tenderness and no muscular tenderness present. Carotid bruit is not present. No rigidity. No tracheal deviation, no edema, no erythema and normal range of motion present. No mass and no thyromegaly present.  Cardiovascular: Normal rate, regular rhythm, S1 normal, S2 normal, normal heart sounds and normal pulses.  PMI is not displaced.  Exam reveals no gallop, no S3, no S4, no distant heart sounds and no friction rub.   No murmur heard.  No systolic murmur is present   No diastolic murmur is present  Pulses:      Carotid pulses are 2+ on the right side, and 2+ on the left side.      Radial pulses are 2+ on the right side, and 2+ on the left side.       Femoral pulses are 2+ on the right side, and 2+ on the left side.      Popliteal pulses are 2+ on the right side, and 2+ on the left side.       Dorsalis pedis pulses are 2+ on the right side, and 2+ on the left side.       Posterior tibial pulses are 2+ on the right side, and 2+ on the left side.  Pulmonary/Chest: No accessory muscle usage or stridor. No apnea and not tachypneic. No respiratory distress. He has no decreased breath sounds. He has no wheezes. He has no rhonchi. He has no rales. Chest wall is not dull to percussion.  He exhibits no mass, no tenderness, no bony tenderness and no deformity.  Abdominal: Soft. Normal appearance and bowel sounds are normal. He exhibits no distension, no ascites and no mass. There is no hepatosplenomegaly. There is no tenderness. There is no  rigidity, no rebound, no guarding and no CVA tenderness.  Musculoskeletal:       Right shoulder: He exhibits normal strength.  Lymphadenopathy:       Head (right side): No submental, no submandibular, no tonsillar, no preauricular, no posterior auricular and no occipital adenopathy present.       Head (left side): No submental, no submandibular, no tonsillar, no preauricular, no posterior auricular and no occipital adenopathy present.    He has no cervical adenopathy.    He has no axillary adenopathy.  Neurological: He is alert. He has normal strength and normal reflexes. No cranial nerve deficit or sensory deficit. He displays a negative Romberg sign.  Skin: Skin is warm and dry. No bruising, no ecchymosis, no lesion and no rash noted. He is not diaphoretic. No cyanosis or erythema. No pallor. Nails show no clubbing.  Psychiatric: He has a normal mood and affect. His speech is normal and behavior is normal. Judgment and thought content normal. His mood appears not anxious. His affect is not angry, not blunt, not labile and not inappropriate. Cognition and memory are normal. He does not exhibit a depressed mood.          Assessment & Plan:

## 2010-08-08 ENCOUNTER — Telehealth: Payer: Self-pay | Admitting: Critical Care Medicine

## 2010-08-08 NOTE — Telephone Encounter (Signed)
Called and spoke with pt.  Pt calling to give an update to his medication list.  Pt states he was changed from Nexium 40mg  to Omeprazole 10mg  once daily.  Also was changed from desoximetasone 0.5% to  fluocinonide 0.05% which he takes prn. Will update pt's med list in Epic.

## 2010-08-13 ENCOUNTER — Ambulatory Visit: Payer: Medicaid Other | Admitting: Adult Health

## 2010-10-04 NOTE — Assessment & Plan Note (Signed)
[facility]                             PULMONARY OFFICE NOTE   NAME:[name], [name] KNOTH                     MRN:          [id number]  DATE:05/18/2006                            DOB:          Feb 03, 1951    HISTORY OF PRESENT ILLNESS:  The patient is a 60 year old white male  patient of Dr. Charlcie Cradle. [facility], who has a known history of severe  atopic asthma, who presents for a routine office visit.  The patient has  not been seen in greater than one year and needs refills.  The patient  had previously been managed on theophylline 200 mg, four tab b.i.d.,  Rhinocort two puffs b.i.d. and Advair 500/50 mg b.i.d. and Singulair 10  mg daily.  The patient reports that he has recently run out of  theophylline and needs a refill.  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  The patient also complained over the last  two days that he has had an increased dry cough and congestion and needs  a refill of his cough syrup.  The patient denies any fever, chills,  chest pain, orthopnea or PND.   PAST MEDICAL HISTORY:  Is reviewed.   CURRENT MEDICATIONS:  Reviewed.   PHYSICAL EXAMINATION:  GENERAL:  The patient is a chronically ill-  appearing male, in no acute distress.  VITAL SIGNS:  He is afebrile with stable vital signs.  O2 saturation is  98% on room air.  HEENT:  Nasal mucosa is slightly pale.  Posterior pharynx is clear.  NECK:  Supple without adenopathy.  No jugular venous distention.  LUNGS:  Sounds are clear bilaterally without any wheezing or crackles.  HEART:  A regular rate and rhythm.  ABDOMEN:  Soft.  EXTREMITIES:  Warm without any edema.   IMPRESSION/PLAN:  History of severe atopic asthma:  The patient's  refills were given.  He may use Mucinex DM b.i.d., along with Endal HD  #8  ounces, one to two teaspoonfuls q.4-6h.  p.r.n. cough for a mild upper respiratory infection.  The patient will  return back with Dr. Delford Field in one month, or sooner if needed.     Rubye Oaks, NP  Electronically Signed      Charlcie Cradle Delford Field, MD, [facility]  Electronically Signed   TP/MedQ  DD: 05/20/2006  DT: 05/20/2006  Job #: [id number]
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
## 2010-10-04 NOTE — Op Note (Signed)
[place]  Patient:    [name] [name], [name] [name] Visit Number: [id number] MRN: [id number]          Service Type: SUR Location: [phone] [id number] Attending Physician:  Erasmo Leventhal Dictated by:   R. Valma Cava, M.D. Proc. Date: 06/30/01 Admit Date:  06/30/2001                             Operative Report  PREOPERATIVE DIAGNOSIS:  Right knee comminuted, displaced patellar fracture.  POSTOPERATIVE DIAGNOSIS:  Right knee comminuted, displaced patellar fracture.  PROCEDURE:  Right knee open reduction/internal fixation of patellar fracture with associated repair of extensor mechanism.  SURGEON:  R. Valma Cava, M.D.  ASSISTANT:  Ottie Glazier. Wynona Neat, P.A.-C.  ANESTHESIA:  General.  Postoperative - femoral nerve block.  ESTIMATED BLOOD LOSS:  Less than 20 cc.  TOURNIQUET TIME:  One hour and 10 minutes at 350 mmHg.  DISPOSITION:  PACU-stable.  COMPLICATIONS:  None.  DRAINS:  None.  OPERATIVE DETAILS:  The patient was counselled in the holding area, correct site was identified.  An IV was started and IV vancomycin was given.  He was taken to the operating room, placed in the supine position under general anesthesia.  I also discussed this with the patient and Dr. Katrinka Blazing beforehand for placement of a femoral nerve block and he had agreed.  The right lower extremity was elevated, prepped with Duraprep and all draped in a sterile fashion.  Exsanguinated with Esmarch.  The tourniquet was inflated to 350 mmHg.  A straight midline incision was made through the skin and subcutaneous tissue. This was a subacute fracture.  The extensor mechanism had somewhat contracted. He had partially torn the retinaculum.  The remainder of the retinaculum was opened up sharply.  The interval between the appropriate level was developed. The fracture was now inspected.  The interarticular aspect of the knee joint appeared to be unremarkable.  It was copiously  irrigated and all hemorrhage was removed.  Proximally, he had a sagittal split in the patella with two large pieces and then distally, there was a very small fragment of bone in the remaining patellar tendon.  At this point in time, the fracture was then opened.  It was irrigated thoroughly, reduced anatomically, and then securely fixed with two 4.0 cannulated screws AO Synthes screws placed from lateral to medial given nice purchase.  Anatomic surface was palpated and inspected and felt to be anatomic.  At this time, I felt it would be best to go ahead and excise the small fragment distally and repair the tendon directly back to bone due to the fact that the remaining bone was not satisfactory for internal fixation.  With this in mind, a 15-blade was used ______ sharply excised the remaining bone distally.  Drill holes were made in the patella and then using #5 Ethibond suture under the correct tension, the repair of the extensor mechanism was repaired back to the patella as anatomically as possible under the correct amount of tension. This was reinforced with some #2 Ethibond sutures.  The knee was put through a general range of flexion.  Suture line remained stable.  The wound was copiously irrigated.  Medial and lateral capsule and retinaculum was closed with Vicryl.  Hemostasis had been.  The retinaculum was closed as well as possible with Vicryl, subcu Vicryl, skin closed with staples.  A sterile compressive dressing applied to the knee.  Tourniquet was deflated.  Normal pulse in the foot and ankle at the end of the case circulation.  Knee immobilizer was applied.  He was also given a femoral nerve block by Dr. Katrinka Blazing.  Sponge and needle counts were correct.  No complications.  He was then gently awakened and taken from the operating room in stable condition. Dictated by:   R. Valma Cava, M.D. Attending Physician:  Erasmo Leventhal DD:  06/30/01 TD:  06/30/01 Job:  [id number]
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
## 2010-10-04 NOTE — Assessment & Plan Note (Signed)
ADDENDUM:  The patient will also follow up with his primary care physician, Dr.  Barbee Shropshire, as well as his pulmonologist and pulmonary P.A. in regards to his  positive review of systems, cardiovascular and GI.  He will follow up with  Dr. Leonides Cave regarding his anxiety, depression, and poor sleep.      Erick Colace, M.D.   AEK/MedQ  D:  06/23/2003 12:07:20  T:  06/23/2003 12:34:29  Job #:  [id number]

## 2010-10-04 NOTE — Discharge Summary (Signed)
[name]. [facility]  Patient:    [name] [name], [name] [name] Visit Number: [id number] MRN: [id number]          Service Type: SUR Location: [id number] [id number] Attending Physician:  Erasmo Leventhal Dictated by:   Druscilla Brownie Shela Nevin, P.A. Admit Date:  06/30/2001 Discharge Date: 07/03/2001   CC:         Kern Reap, M.D.   Discharge Summary  ADMITTING DIAGNOSES:  1. Fractured patella and disruption of extensor mechanism, right knee.  2. Asthma.  3. Eczema.  4. Multiple allergies.  5. Proctitis.  6. History of L1 fracture.  7. Reflux.  8. History of rib fractures.  9. Chronic constipation. 10. Chronic pain syndrome.  DISCHARGE DIAGNOSES:  1. Fractured patella and disruption of extensor mechanism, right knee.  2. Asthma.  3. Eczema.  4. Multiple allergies.  5. Proctitis.  6. History of L1 fracture.  7. Reflux.  8. History of rib fractures.  9. Chronic constipation. 10. Chronic pain syndrome.  PROCEDURE:  On June 30, 2001, the patient underwent open reduction internal fixation of patella of the right knee and repair of extensor mechanism.  Karie Chimera assisted.  CONSULTATIONS:  None.  BRIEF HISTORY:  This 60 year old white male fell about 10 days prior to this admission.  He fell directly onto his knee and sustained a fracture of the patella on the right.  He was seen in the emergency room, splinted, and then followed up in the office.  He was scheduled for the surgery mentioned above.  HOSPITAL COURSE:  The patient tolerated the surgical procedure quite well but due to his chronic pain syndrome we had a considerable amount of problems with finding pain medications for comfort.  We allowed touchdown weightbearing with knee immobilizer in the operative extremity.  He was very familiar in crutch walking as well as using a walker.  Once we were able to control his discomfort, it was felt that he could be discharged home.  He can shower  in about five days being very careful.  Dr. Thomasena Edis wrote his discharge plan out for [place] and we followed it at the time of discharge.  LABORATORY VALUES:  Values in the hospital showed a hemoglobin postoperatively of 12.7.  Hematocrit was 36.9.  Preoperatively it was normal as well as his electrolytes.  Urinalysis negative for urinary tract infection.  X-rays showed changes of COPD.  They picked up on the L1 burst fracture. Electrocardiogram showed normal sinus rhythm with a short PR interval.  CONDITION ON DISCHARGE:  Improved, stable.  PLAN:  The patient is discharged to his home.  He is to continue with the touchdown weightbearing on the right lower extremity using crutches or walker. He must wear the knee immobilizer at all times.  He was given a prescription for OxyContin 40 mg #60 one t.i.d., Oxy-IR #60 one q.2-4h. p.r.n. breakthrough pain, Robaxin 500 mg #40 one q.6h. p.r.n. muscle spasm.  Continue with his home medications and diet.  He is to see Dr. Thomasena Edis two weeks after surgery. We discussed that after his surgical pain is resolved, he will need to see Dr. Geni Bers at the pain center as we will not supply him with continuing analgesics.  He understood this at discharge.  On the day of discharge, the wound was dry.  Neurovascular was intact at the right lower extremity. Dictated by:   Druscilla Brownie. Shela Nevin, P.A. Attending Physician:  Erasmo Leventhal DD:  07/03/01 TD:  07/03/01 Job:  [id number]

## 2010-10-04 NOTE — Assessment & Plan Note (Signed)
REFERRING PHYSICIAN:  [name], M.D.   DATE OF BIRTH:  30-Apr-1951.   MEDICAL RECORD NUMBER:  [id number].   HISTORY OF PRESENT ILLNESS:  A 60 year old male last seen by me April 03, 2003.  He has a history of back pain dating back to 29 where he sustained  an L1 compression fracture.  He has multiple other medical problems such as  asthma, COPD, atopic dermatitis, and failed left corneal transplant.  He has  a history of positive cocaine urine testing at Dr. Lahoma [name] office with  discharge.  I initially saw him and he stated he was using marijuana, does  not have any intent to quit, but he states over the last three months he has  quit using marijuana.  He would like to be considered again for narcotic  analgesic usage to control his chronic low back as well as neck pain.   He has gone through physical therapy and continues to do some basic  exercises that they have recommended.  He has followed through with TENS  trial and this was not particularly helpful.  He has followed through as  well with Dr. Gladstone [name] and has done some work on chronic depression  in regard to his multiple medical problems.   CURRENT MEDICATIONS:  Basically unchanged compared to previous.  He is on:  1. Hydroxyzine.  2. Allegra.  3. Guaifenesin.  4. Valtrex.  5. Actonel.  6. Trazodone.  7. Alphagan ophthalmic solution.  8. Fluoxetine 20 daily.  9. Lorazepam 15 p.o. daily.  10.      Dexamethasone cream.  11.      Rhinocort spray.  12.      Nasonex.  13.      Mobic.  14.      Nexium.  15.      Oyster shell.  16.      Theophylline.  17.      Ocuflox.  18.      Trusopt.  19.      Diazepam 5 t.i.d.  20.      Albuterol p.r.n.   Pain rated at 9/10.  It improves with rest and medication.  It is made worse  with walking, bending, sitting and working and other activities.   REVIEW OF SYMPTOMS:  As above.  He has had some problems with his breathing  because of allergy  season.   PHYSICAL EXAMINATION:  VITAL SIGNS:  Blood pressure 123/91, pulse 111.  He  has frequent ectopic beats, pulse oximeter 95% on room air.  NECK:  No tenderness to palpation.  No tenderness with fibromyalgia tender  point palpation but does have axial pain down posterior paraspinal area  bilaterally diffuse.   Forward flexion 25% of normal, lateral bending and rotation 25% of normal.  Motor strength 5/5 on the right side, on left side cannot test as left  shoulder had recent subacute greater tuberosity avulsion fracture.   Deep tendon reflexes are normal bilateral upper and lower extremities.  Range of motion in lower extremities is full.  There is full motor strength  bilateral lower extremities.  Gait without toe drag or knee instability.   IMPRESSION:  1. Chronic pain related to L1 compression fracture.  Does have some     myofascial component to his pain as well.  He has cervical spondylosis as     well and facet arthropathy in the cervical spine.  2. History of illicit drug abuse.  He requires very  intensive monitoring in     order to be on narcotic analgesics and would recommend urine drug screen     before starting any narcotic analgics knowing that he will have some     oxycodone positive due to [name] [name] prescription for the shoulder     fracture and expect positive benzodiazepines due to Diazepam and     Temazepam.  We will give him a trial of narcotic analgesics, monitor     compliance with urine drug screen.  Will monitor activity level using     pain diarrhea.   PLAN:  Will do urine drug screen today.  If does not show any illicit drugs  and does not show any other controlled substances that he has not admitted  to, would initiate MS Contin 15 mg p.o. b.i.d.  If he is requiring t.i.d.  dosing of this, then I would consider switch to Avinza or Kadian.  Will need  to see him on a monthly basis with urine drug screen.      [name] [name], M.D.    AEK/MedQ  D:  10/02/2003 16:40:00  T:  10/02/2003 18:25:57  Job #:  [id number]   cc:   [name] [name], M.D.  [address]  [address]  [place]  [place] [address]  Fax: [phone]   [name] [name], M.D.  [facility]  [address]  [address]  [place]  [place] [address]  Fax: [phone]

## 2010-11-26 ENCOUNTER — Encounter: Payer: Self-pay | Admitting: Adult Health

## 2010-11-26 ENCOUNTER — Ambulatory Visit (INDEPENDENT_AMBULATORY_CARE_PROVIDER_SITE_OTHER): Payer: Medicaid Other | Admitting: Adult Health

## 2010-11-26 VITALS — BP 130/70 | HR 81 | Temp 97.3°F | Ht 71.0 in | Wt 171.8 lb

## 2010-11-26 DIAGNOSIS — J45909 Unspecified asthma, uncomplicated: Secondary | ICD-10-CM

## 2010-11-26 MED ORDER — CHLORPHENIRAMINE MALEATE 4 MG PO TABS: 8.0000 mg | ORAL_TABLET | Freq: Every day | ORAL | Status: AC

## 2010-11-26 MED ORDER — OMEPRAZOLE 40 MG PO CPDR: 40.0000 mg | DELAYED_RELEASE_CAPSULE | Freq: Two times a day (BID) | ORAL | Status: DC

## 2010-11-26 NOTE — Assessment & Plan Note (Addendum)
Mild flare with rhinitis and GERD   Plan;  Add Clortrimeton 4mg  2 tabs at bedtime.  Saline nasal rinses Twice daily   Increase PPI Twice daily   GERD diet  follow up Dr. Delford Field  2 months  Please contact office for sooner follow up if symptoms do not improve or worsen or seek emergency care

## 2010-11-26 NOTE — Patient Instructions (Addendum)
Add Clortrimeton 4mg  2 tabs at bedtime.  Saline nasal rinses Twice daily   Increase Prilosec 40mg  Twice daily   GERD diet  follow up Dr. Delford Field  2 months  Please contact office for sooner follow up if symptoms do not improve or worsen or seek emergency care

## 2010-11-26 NOTE — Progress Notes (Signed)
Subjective:    Patient ID: [name] [name], male    DOB: Apr 25, 1951, 60 y.o.   MRN: [id number]  HPI 46 with known history of severe atopic asthma.   March 05, 2010 -The pt had more wheezing and worse this fall. The pt has to clear throat constantly  The astepro and pred helped . No other new issues.   July 16, 2010 --Presents for an acute office visit.Complains of increased dyspnea, wheezing esp at night, increased SOB, PND w/ prod cough with yellow mucus, HA, sinus congestion x81month. Lots of sinus congestion, headache,sinus pressure. Taking advil 800mg . Today heart rate is up he says he has been taking decongestants and using his albuterol. NO chest pain or palpitations. Denies chest pain, orthopnea, hemoptysis, fever, n/v/d, edema.     08/06/2010 At last ov we Rec: 1) Levaquin 500mg  once daily for 10 days  2) Mucinex DM two times a day as needed cough/congestion  3) Saline nasal rinses  4) Hold Claritin until done with Levaquin    Symptoms are the same,  Dark yellow is better.  Still congested ,  However now, R side is caved in on sinus and closed off and  has to have pndrp for this    11/26/2010 follow up  Pt returns for follow up . Says he is doing okay except over last few months has some wheezing at night at times. More reflux and drainage  Esp at night. He has gotten a new puppy last month. No discolored mucus. NO fever.  No symptoms in daytime. Does have bad reflux at night when he lies flat.    Review of Systems Constitutional:   No  weight loss, night sweats,  Fevers, chills, fatigue, or  lassitude.  HEENT:   No headaches,  Difficulty swallowing,  Tooth/dental problems, or  Sore throat,                No sneezing, itching, ear ache, nasal congestion, post nasal drip,   CV:  No chest pain,  Orthopnea, PND, swelling in lower extremities, anasarca, dizziness, palpitations, syncope.   GI  No heartburn, indigestion, abdominal pain, nausea, vomiting, diarrhea, change in  bowel habits, loss of appetite, bloody stools.   Resp:  No coughing up of blood.    No chest wall deformity  Skin: no rash or lesions.  GU: no dysuria, change in color of urine, no urgency or frequency.  No flank pain, no hematuria   MS:  No joint pain or swelling.  No decreased range of motion.     Psych:  No change in mood or affect. No depression or anxiety.            Objective:   Physical Exam GEN: A/Ox3; pleasant , NAD, elderly , w/ walking cane   HEENT:  [place]/AT,  EACs-clear, TMs-wnl, NOSE-clear, THROAT-clear, no lesions, no postnasal drip or exudate noted.   NECK:  Supple w/ fair ROM; no JVD; normal carotid impulses w/o bruits; no thyromegaly or nodules palpated; no lymphadenopathy.  RESP  Coarse BS w/ no wheezing  w/o, wheezes/ rales/ or rhonchi.no accessory muscle use, no dullness to percussion  CARD:  RRR, no m/r/g  , no peripheral edema, pulses intact, no cyanosis or clubbing.  GI:   Soft & nt; nml bowel sounds; no organomegaly or masses detected.  Musco: Warm bil, no deformities or joint swelling noted.   Neuro: alert, no focal deficits noted.    Skin: Warm, few excoriations along arms  Assessment & Plan:

## 2011-01-07 ENCOUNTER — Encounter: Payer: Self-pay | Admitting: Critical Care Medicine

## 2011-01-07 ENCOUNTER — Ambulatory Visit (INDEPENDENT_AMBULATORY_CARE_PROVIDER_SITE_OTHER): Payer: Medicaid Other | Admitting: Critical Care Medicine

## 2011-01-07 DIAGNOSIS — J45909 Unspecified asthma, uncomplicated: Secondary | ICD-10-CM
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
## 2011-01-07 NOTE — Patient Instructions (Signed)
No change in medications. Return in        2 months 

## 2011-01-08 NOTE — Assessment & Plan Note (Addendum)
Severe persistent asthma , steroid dependent, severe atopy Plan No change in inhaled or maintenance medications. Return in  2 months

## 2011-01-14 ENCOUNTER — Ambulatory Visit: Payer: Medicaid Other | Admitting: Critical Care Medicine

## 2011-03-18 ENCOUNTER — Ambulatory Visit (INDEPENDENT_AMBULATORY_CARE_PROVIDER_SITE_OTHER): Payer: Medicaid Other | Admitting: Critical Care Medicine

## 2011-03-18 ENCOUNTER — Encounter: Payer: Self-pay | Admitting: Critical Care Medicine

## 2011-03-18 ENCOUNTER — Telehealth: Payer: Self-pay | Admitting: Critical Care Medicine

## 2011-03-18 DIAGNOSIS — J45909 Unspecified asthma, uncomplicated: Secondary | ICD-10-CM

## 2011-03-18 MED ORDER — PREDNISONE 10 MG PO TABS: ORAL_TABLET | ORAL | Status: DC

## 2011-03-18 MED ORDER — MONTELUKAST SODIUM 10 MG PO TABS: 10.0000 mg | ORAL_TABLET | Freq: Every day | ORAL | Status: DC

## 2011-03-18 MED ORDER — MOXIFLOXACIN HCL 400 MG PO TABS: 400.0000 mg | ORAL_TABLET | Freq: Every day | ORAL | Status: AC

## 2011-03-18 NOTE — Assessment & Plan Note (Signed)
Severe persistent asthma with significant atopic component recent flare due to sinusitis Plan Obtain proper dosage of theophylline Recycle prednisone Avelox for 5 days No other changes in medications
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
## 2011-03-18 NOTE — Patient Instructions (Signed)
Take avelox one daily for 5 days Prednisone 10mg  Take 4 for three days 3 for three days 2 for three days 1 for three days and stop No other med changes

## 2011-03-18 NOTE — Telephone Encounter (Signed)
Noted and added to patients med list.

## 2011-04-08 ENCOUNTER — Telehealth: Payer: Self-pay | Admitting: Critical Care Medicine

## 2011-04-08 MED ORDER — PREDNISONE 10 MG PO TABS: ORAL_TABLET | ORAL | Status: DC

## 2011-04-08 MED ORDER — AZITHROMYCIN 250 MG PO TABS: ORAL_TABLET | ORAL | Status: AC

## 2011-04-08 NOTE — Telephone Encounter (Signed)
Spoke with pt and notified of recs per PW. He verbalized understanding, but wants to know if he can have a longer prednisone taper. He states that with his skin dz, he "rebounds when given short courses of prednisone and skin goes haywire".  He states that this has happened in the past and PW should know what he is referring to. Please advise thanks!

## 2011-04-08 NOTE — Telephone Encounter (Signed)
Rx azithromycin 250mg   Take two once then one daily until gone  #6 Prednisone 10mg  Take 4 for three days 3 for three days 2 for three days 1 for three days and stop  #30

## 2011-04-08 NOTE — Telephone Encounter (Signed)
Ok to reRx prednisone 10mg  Take 4 for four days 3 for four days 2 for four days 1 for four days #40

## 2011-04-08 NOTE — Telephone Encounter (Signed)
Spoke with [name] [name]. He states that he does not notice any improvement in breathing since the last visit on 03-18-11. He still c/o PND, yellow sputum, and facial pressure/HA. Denies any cough, wheeze, fever. States that he already finished pred taper and avelox. I offered ov with PW for tomorrow and he declined stating that he would not have time to come in this wk at all. PW, please advise recs thanks! Allergies  Allergen Reactions  . Bacitracin-Polymyxin B     polysporin  . Codeine   . Penicillins

## 2011-04-08 NOTE — Telephone Encounter (Signed)
I called pt and he states he is going to have to call me back. Will await pt call back

## 2011-04-08 NOTE — Telephone Encounter (Signed)
Pt aware of PW recs and rx's have been sent for pt. Pt is aware rx's sent and understood directions for the medications

## 2011-04-16 ENCOUNTER — Telehealth: Payer: Self-pay | Admitting: Critical Care Medicine

## 2011-04-16 MED ORDER — ALBUTEROL SULFATE (2.5 MG/3ML) 0.083% IN NEBU: 2.5000 mg | INHALATION_SOLUTION | RESPIRATORY_TRACT | Status: DC | PRN

## 2011-04-16 NOTE — Telephone Encounter (Signed)
rx for the albuterol sulfate has been sent to the pharmacy per pts request. Called and spoke with pt and pt is aware of rx sent to the pharmacy requested.

## 2011-04-28 ENCOUNTER — Telehealth: Payer: Self-pay | Admitting: Critical Care Medicine

## 2011-04-29 ENCOUNTER — Ambulatory Visit: Payer: Medicaid Other | Admitting: Critical Care Medicine

## 2011-04-29 NOTE — Telephone Encounter (Signed)
Per pt, Singulair needs PA with his insurance. I spoke with the pharmacist and she ran the generic for Singulair and it went through with a co-pay of $3. Pt informed he may pick up his rx at the pharmacy.

## 2011-05-05 ENCOUNTER — Ambulatory Visit: Payer: Medicaid Other | Admitting: Adult Health

## 2011-06-02 ENCOUNTER — Telehealth: Payer: Self-pay | Admitting: Critical Care Medicine

## 2011-06-02 DIAGNOSIS — J45909 Unspecified asthma, uncomplicated: Secondary | ICD-10-CM

## 2011-06-02 NOTE — Telephone Encounter (Signed)
Called and spoke with pt and he is aware that order has been sent in for him to get neb supplies from [facility].  He is aware that they will contact him once order has been received.

## 2011-06-03 ENCOUNTER — Telehealth: Payer: Self-pay | Admitting: Critical Care Medicine

## 2011-06-03 DIAGNOSIS — J4550 Severe persistent asthma, uncomplicated: Secondary | ICD-10-CM

## 2011-06-03 NOTE — Telephone Encounter (Signed)
Order was sent to [facility] for a new neb machine.

## 2011-06-04 ENCOUNTER — Encounter: Payer: Self-pay | Admitting: Adult Health

## 2011-06-04 ENCOUNTER — Ambulatory Visit (INDEPENDENT_AMBULATORY_CARE_PROVIDER_SITE_OTHER): Payer: Medicaid Other | Admitting: Adult Health

## 2011-06-04 DIAGNOSIS — J45909 Unspecified asthma, uncomplicated: Secondary | ICD-10-CM

## 2011-06-04 DIAGNOSIS — Z23 Encounter for immunization: Secondary | ICD-10-CM

## 2011-06-04 MED ORDER — PREDNISONE 10 MG PO TABS: ORAL_TABLET | ORAL | Status: DC

## 2011-06-04 MED ORDER — METHYLPREDNISOLONE ACETATE 80 MG/ML IJ SUSP
120.0000 mg | Freq: Once | INTRAMUSCULAR | Status: AC
  Administered 2011-06-04: 120 mg via INTRAMUSCULAR

## 2011-06-04 NOTE — Patient Instructions (Signed)
Avelox 400mg  daily for 7 days  Prednisone taper over next week.  Mucinex DM Twice daily  As needed  Cough/congestion  Please contact office for sooner follow up if symptoms do not improve or worsen or seek emergency care

## 2011-06-05 NOTE — Assessment & Plan Note (Signed)
Exacerbation  Xopenex neb in office  Depo Medrol 120mg  IM x 1   Plan:  Avelox x 7 days - samples given  Prednisone taper given  Mucinex DM Twice daily  As needed  Cough/congestion  Fluids and rest  Please contact office for sooner follow up if symptoms do not improve or worsen or seek emergency care  follow up Dr. Delford Field  In 4 weeks and As needed
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
## 2011-06-06 ENCOUNTER — Telehealth: Payer: Self-pay | Admitting: Adult Health

## 2011-06-06 NOTE — Telephone Encounter (Signed)
I have added this to pt medication list and pt is aware. Nothing further was needed

## 2011-06-25 DIAGNOSIS — H16299 Other keratoconjunctivitis, unspecified eye: Secondary | ICD-10-CM | POA: Insufficient documentation

## 2011-07-15 ENCOUNTER — Ambulatory Visit: Payer: Medicaid Other | Admitting: Critical Care Medicine

## 2011-08-19 ENCOUNTER — Emergency Department (HOSPITAL_COMMUNITY): Payer: Medicaid Other

## 2011-08-19 ENCOUNTER — Inpatient Hospital Stay (HOSPITAL_COMMUNITY)
Admission: EM | Admit: 2011-08-19 | Discharge: 2011-08-25 | DRG: 158 | Disposition: A | Discharge status: Home / Self Care | Payer: Medicaid Other | Attending: General Surgery | Admitting: General Surgery

## 2011-08-19 ENCOUNTER — Encounter (HOSPITAL_COMMUNITY): Payer: Self-pay | Admitting: Emergency Medicine

## 2011-08-19 DIAGNOSIS — S06309A Unspecified focal traumatic brain injury with loss of consciousness of unspecified duration, initial encounter: Secondary | ICD-10-CM | POA: Diagnosis present

## 2011-08-19 DIAGNOSIS — S06330A Contusion and laceration of cerebrum, unspecified, without loss of consciousness, initial encounter: Secondary | ICD-10-CM

## 2011-08-19 DIAGNOSIS — M899 Disorder of bone, unspecified: Secondary | ICD-10-CM | POA: Diagnosis present

## 2011-08-19 DIAGNOSIS — S0083XA Contusion of other part of head, initial encounter: Secondary | ICD-10-CM | POA: Diagnosis present

## 2011-08-19 DIAGNOSIS — Z87891 Personal history of nicotine dependence: Secondary | ICD-10-CM

## 2011-08-19 DIAGNOSIS — IMO0002 Reserved for concepts with insufficient information to code with codable children: Secondary | ICD-10-CM

## 2011-08-19 DIAGNOSIS — S0990XA Unspecified injury of head, initial encounter: Secondary | ICD-10-CM

## 2011-08-19 DIAGNOSIS — H543 Unqualified visual loss, both eyes: Secondary | ICD-10-CM | POA: Diagnosis present

## 2011-08-19 DIAGNOSIS — K219 Gastro-esophageal reflux disease without esophagitis: Secondary | ICD-10-CM | POA: Diagnosis present

## 2011-08-19 DIAGNOSIS — L259 Unspecified contact dermatitis, unspecified cause: Secondary | ICD-10-CM | POA: Diagnosis present

## 2011-08-19 DIAGNOSIS — S0280XA Fracture of other specified skull and facial bones, unspecified side, initial encounter for closed fracture: Secondary | ICD-10-CM | POA: Diagnosis present

## 2011-08-19 DIAGNOSIS — S0180XA Unspecified open wound of other part of head, initial encounter: Secondary | ICD-10-CM | POA: Diagnosis present

## 2011-08-19 DIAGNOSIS — S93409A Sprain of unspecified ligament of unspecified ankle, initial encounter: Secondary | ICD-10-CM | POA: Diagnosis present

## 2011-08-19 DIAGNOSIS — S02401A Maxillary fracture, unspecified, initial encounter for closed fracture: Principal | ICD-10-CM | POA: Diagnosis present

## 2011-08-19 DIAGNOSIS — S02400A Malar fracture unspecified, initial encounter for closed fracture: Principal | ICD-10-CM | POA: Diagnosis present

## 2011-08-19 DIAGNOSIS — S52501A Unspecified fracture of the lower end of right radius, initial encounter for closed fracture: Secondary | ICD-10-CM | POA: Diagnosis present

## 2011-08-19 DIAGNOSIS — S0292XA Unspecified fracture of facial bones, initial encounter for closed fracture: Secondary | ICD-10-CM | POA: Diagnosis present

## 2011-08-19 DIAGNOSIS — M949 Disorder of cartilage, unspecified: Secondary | ICD-10-CM | POA: Diagnosis present

## 2011-08-19 DIAGNOSIS — S0003XA Contusion of scalp, initial encounter: Secondary | ICD-10-CM | POA: Diagnosis present

## 2011-08-19 DIAGNOSIS — S069X9A Unspecified intracranial injury with loss of consciousness of unspecified duration, initial encounter: Secondary | ICD-10-CM

## 2011-08-19 DIAGNOSIS — S52599A Other fractures of lower end of unspecified radius, initial encounter for closed fracture: Secondary | ICD-10-CM | POA: Diagnosis present

## 2011-08-19 HISTORY — DX: Essential (primary) hypertension: I10

## 2011-08-19 HISTORY — DX: Chronic obstructive pulmonary disease, unspecified: J44.9

## 2011-08-19 LAB — BASIC METABOLIC PANEL
Calcium: 8.8 mg/dL (ref 8.4–10.5)
Chloride: 110 mEq/L (ref 96–112)
Creatinine, Ser: 1.48 mg/dL — ABNORMAL HIGH (ref 0.50–1.35)
GFR calc Af Amer: 58 mL/min — ABNORMAL LOW (ref >90)
Sodium: 141 mEq/L (ref 135–145)

## 2011-08-19 LAB — ABO/RH: ABO/RH(D): O POS

## 2011-08-19 LAB — CBC
HCT: 42.2 % (ref 39.0–52.0)
Hemoglobin: 14.9 g/dL (ref 13.0–17.0)
MCH: 32.3 pg (ref 26.0–34.0)
MCHC: 35.3 g/dL (ref 30.0–36.0)
RBC: 4.62 MIL/uL (ref 4.22–5.81)

## 2011-08-19 LAB — TYPE AND SCREEN
ABO/RH(D): O POS
Antibody Screen: NEGATIVE

## 2011-08-19 MED ORDER — FLUTICASONE-SALMETEROL 500-50 MCG/DOSE IN AEPB
1.0000 | INHALATION_SPRAY | Freq: Two times a day (BID) | RESPIRATORY_TRACT | Status: DC
  Administered 2011-08-19 – 2011-08-25 (×11): 1 via RESPIRATORY_TRACT
  Filled 2011-08-19: qty 14

## 2011-08-19 MED ORDER — MORPHINE SULFATE 2 MG/ML IJ SOLN
2.0000 mg | Freq: Once | INTRAMUSCULAR | Status: AC
  Administered 2011-08-19: 2 mg via INTRAVENOUS
  Filled 2011-08-19: qty 1

## 2011-08-19 MED ORDER — SERTRALINE HCL 100 MG PO TABS
100.0000 mg | ORAL_TABLET | Freq: Every day | ORAL | Status: DC
  Administered 2011-08-19 – 2011-08-24 (×6): 100 mg via ORAL
  Filled 2011-08-19 (×7): qty 1

## 2011-08-19 MED ORDER — AZELASTINE HCL 0.1 % NA SOLN
1.0000 | Freq: Every day | NASAL | Status: DC
  Administered 2011-08-20 – 2011-08-23 (×4): 1 via NASAL
  Filled 2011-08-19: qty 30

## 2011-08-19 MED ORDER — SODIUM CHLORIDE 0.9 % IV SOLN
INTRAVENOUS | Status: DC
  Administered 2011-08-19 (×2): via INTRAVENOUS
  Administered 2011-08-19 – 2011-08-20 (×2): 125 mL/h via INTRAVENOUS

## 2011-08-19 MED ORDER — IOHEXOL 300 MG/ML  SOLN
80.0000 mL | Freq: Once | INTRAMUSCULAR | Status: AC | PRN
  Administered 2011-08-19: 80 mL via INTRAVENOUS

## 2011-08-19 MED ORDER — DIPHENHYDRAMINE HCL 12.5 MG/5ML PO ELIX
12.5000 mg | ORAL_SOLUTION | Freq: Four times a day (QID) | ORAL | Status: DC | PRN
  Filled 2011-08-19: qty 5

## 2011-08-19 MED ORDER — NALOXONE HCL 0.4 MG/ML IJ SOLN: 0.4000 mg | INTRAMUSCULAR | Status: DC | PRN

## 2011-08-19 MED ORDER — OLOPATADINE HCL 0.1 % OP SOLN
2.0000 [drp] | Freq: Two times a day (BID) | OPHTHALMIC | Status: DC
  Administered 2011-08-19 – 2011-08-24 (×11): 2 [drp] via OPHTHALMIC
  Filled 2011-08-19: qty 5

## 2011-08-19 MED ORDER — ARIPIPRAZOLE 2 MG PO TABS
2.0000 mg | ORAL_TABLET | Freq: Two times a day (BID) | ORAL | Status: DC
  Administered 2011-08-19 – 2011-08-25 (×12): 2 mg via ORAL
  Filled 2011-08-19 (×14): qty 1

## 2011-08-19 MED ORDER — MORPHINE SULFATE 4 MG/ML IJ SOLN: 6.0000 mg | Freq: Once | INTRAMUSCULAR | Status: DC

## 2011-08-19 MED ORDER — MORPHINE SULFATE 4 MG/ML IJ SOLN
6.0000 mg | Freq: Once | INTRAMUSCULAR | Status: AC
  Administered 2011-08-19: 6 mg via INTRAVENOUS
  Filled 2011-08-19: qty 1

## 2011-08-19 MED ORDER — PANTOPRAZOLE SODIUM 40 MG IV SOLR
40.0000 mg | Freq: Every day | INTRAVENOUS | Status: DC
  Administered 2011-08-19: 40 mg via INTRAVENOUS
  Filled 2011-08-19 (×2): qty 40

## 2011-08-19 MED ORDER — MONTELUKAST SODIUM 10 MG PO TABS
10.0000 mg | ORAL_TABLET | Freq: Every day | ORAL | Status: DC
  Administered 2011-08-19 – 2011-08-23 (×5): 10 mg via ORAL
  Filled 2011-08-19 (×7): qty 1

## 2011-08-19 MED ORDER — IRBESARTAN 300 MG PO TABS
300.0000 mg | ORAL_TABLET | Freq: Every day | ORAL | Status: DC
  Administered 2011-08-20 – 2011-08-25 (×6): 300 mg via ORAL
  Filled 2011-08-19 (×7): qty 1

## 2011-08-19 MED ORDER — HYDROXYZINE HCL 25 MG PO TABS
25.0000 mg | ORAL_TABLET | ORAL | Status: DC | PRN
  Administered 2011-08-19 – 2011-08-22 (×7): 25 mg via ORAL
  Filled 2011-08-19 (×9): qty 1

## 2011-08-19 MED ORDER — CALCIUM CARBONATE-VITAMIN D 600-400 MG-UNIT PO TABS: 1.0000 | ORAL_TABLET | Freq: Two times a day (BID) | ORAL | Status: DC

## 2011-08-19 MED ORDER — TETANUS-DIPHTH-ACELL PERTUSSIS 5-2.5-18.5 LF-MCG/0.5 IM SUSP
0.5000 mL | Freq: Once | INTRAMUSCULAR | Status: AC
  Administered 2011-08-19: 0.5 mL via INTRAMUSCULAR
  Filled 2011-08-19: qty 0.5

## 2011-08-19 MED ORDER — ONDANSETRON HCL 4 MG/2ML IJ SOLN: 4.0000 mg | Freq: Four times a day (QID) | INTRAMUSCULAR | Status: DC | PRN

## 2011-08-19 MED ORDER — PANTOPRAZOLE SODIUM 40 MG PO TBEC: 40.0000 mg | DELAYED_RELEASE_TABLET | Freq: Every day | ORAL | Status: DC

## 2011-08-19 MED ORDER — MORPHINE SULFATE (PF) 1 MG/ML IV SOLN
INTRAVENOUS | Status: DC
  Administered 2011-08-19: 10.5 mg via INTRAVENOUS
  Administered 2011-08-19 (×2): via INTRAVENOUS
  Administered 2011-08-20 (×2): 1.5 mg via INTRAVENOUS
  Administered 2011-08-20: 3 mg via INTRAVENOUS
  Filled 2011-08-19 (×2): qty 25

## 2011-08-19 MED ORDER — FLUOCINONIDE 0.05 % EX CREA
TOPICAL_CREAM | Freq: Two times a day (BID) | CUTANEOUS | Status: DC
  Administered 2011-08-19 – 2011-08-20 (×3): via TOPICAL
  Administered 2011-08-20 – 2011-08-21 (×2): 1 via TOPICAL
  Administered 2011-08-24 – 2011-08-25 (×2): via TOPICAL
  Filled 2011-08-19 (×2): qty 30

## 2011-08-19 MED ORDER — FLUTICASONE PROPIONATE 50 MCG/ACT NA SUSP
1.0000 | Freq: Every day | NASAL | Status: DC
  Administered 2011-08-20 – 2011-08-24 (×5): 1 via NASAL
  Filled 2011-08-19: qty 16

## 2011-08-19 MED ORDER — DIPHENHYDRAMINE HCL 50 MG/ML IJ SOLN: 12.5000 mg | Freq: Four times a day (QID) | INTRAMUSCULAR | Status: DC | PRN

## 2011-08-19 MED ORDER — SODIUM CHLORIDE 0.9 % IJ SOLN: 9.0000 mL | INTRAMUSCULAR | Status: DC | PRN

## 2011-08-19 MED ORDER — ONDANSETRON HCL 4 MG PO TABS
4.0000 mg | ORAL_TABLET | Freq: Four times a day (QID) | ORAL | Status: DC | PRN
  Filled 2011-08-19: qty 0.5

## 2011-08-19 MED ORDER — CALCIUM CARBONATE-VITAMIN D 500-200 MG-UNIT PO TABS
1.0000 | ORAL_TABLET | Freq: Two times a day (BID) | ORAL | Status: DC
  Administered 2011-08-20 – 2011-08-25 (×11): 1 via ORAL
  Filled 2011-08-19 (×15): qty 1

## 2011-08-19 MED ORDER — MORPHINE SULFATE (PF) 1 MG/ML IV SOLN
INTRAVENOUS | Status: AC
  Administered 2011-08-19: 20.96 mg via INTRAVENOUS
  Filled 2011-08-19: qty 25

## 2011-08-19 MED ORDER — MORPHINE SULFATE 2 MG/ML IJ SOLN
INTRAMUSCULAR | Status: AC
  Filled 2011-08-19: qty 1

## 2011-08-19 MED ORDER — POTASSIUM CHLORIDE 2 MEQ/ML IV SOLN
INTRAVENOUS | Status: DC
  Administered 2011-08-19 – 2011-08-20 (×2): via INTRAVENOUS
  Filled 2011-08-19 (×9): qty 1000
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
## 2011-08-19 NOTE — ED Notes (Signed)
Ortho Tech at the bedside ° °

## 2011-08-19 NOTE — ED Notes (Signed)
Pt still out of the department at this time 
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
## 2011-08-19 NOTE — Progress Notes (Signed)
Orthopedic Tech Progress Note Patient Details:  [name] [name] [birth year] [id number]  Type of Splint: Sugartong Splint Location: right arm Splint Interventions: Application    [name] T 08/19/2011, 10:08 AM

## 2011-08-19 NOTE — ED Notes (Signed)
The Trauma PAC at the bedside

## 2011-08-20 ENCOUNTER — Inpatient Hospital Stay (HOSPITAL_COMMUNITY): Payer: Medicaid Other

## 2011-08-20 LAB — URINE MICROSCOPIC-ADD ON

## 2011-08-20 LAB — CBC
Hemoglobin: 12.7 g/dL — ABNORMAL LOW (ref 13.0–17.0)
MCH: 31.6 pg (ref 26.0–34.0)
Platelets: 187 10*3/uL (ref 150–400)
RBC: 4.02 MIL/uL — ABNORMAL LOW (ref 4.22–5.81)
WBC: 11 10*3/uL — ABNORMAL HIGH (ref 4.0–10.5)

## 2011-08-20 LAB — BASIC METABOLIC PANEL
CO2: 20 mEq/L (ref 19–32)
Calcium: 8.4 mg/dL (ref 8.4–10.5)
Chloride: 112 mEq/L (ref 96–112)
Glucose, Bld: 86 mg/dL (ref 70–99)
Sodium: 145 mEq/L (ref 135–145)

## 2011-08-20 LAB — URINALYSIS, ROUTINE W REFLEX MICROSCOPIC
Bilirubin Urine: NEGATIVE
Glucose, UA: NEGATIVE mg/dL
Ketones, ur: NEGATIVE mg/dL
Leukocytes, UA: NEGATIVE
Nitrite: NEGATIVE
Specific Gravity, Urine: 1.034 — ABNORMAL HIGH (ref 1.005–1.030)
pH: 5 (ref 5.0–8.0)

## 2011-08-20 MED ORDER — OXYCODONE HCL 5 MG PO TABS
5.0000 mg | ORAL_TABLET | ORAL | Status: DC
  Administered 2011-08-20 – 2011-08-21 (×8): 5 mg via ORAL
  Filled 2011-08-20 (×8): qty 1

## 2011-08-20 MED ORDER — LIDOCAINE HCL 1 % IJ SOLN: 10.0000 mL | Freq: Once | INTRAMUSCULAR | Status: AC

## 2011-08-20 MED ORDER — LIDOCAINE HCL (PF) 1 % IJ SOLN
INTRAMUSCULAR | Status: AC
  Administered 2011-08-20: 5 mL
  Filled 2011-08-20: qty 5

## 2011-08-20 MED ORDER — LIDOCAINE HCL 1 % IJ SOLN: 10.0000 mL | Freq: Once | INTRAMUSCULAR | Status: DC

## 2011-08-20 MED ORDER — MORPHINE SULFATE 2 MG/ML IJ SOLN
2.0000 mg | INTRAMUSCULAR | Status: DC | PRN
  Administered 2011-08-20 – 2011-08-25 (×6): 2 mg via INTRAVENOUS
  Filled 2011-08-20 (×6): qty 1

## 2011-08-20 MED ORDER — OXYCODONE HCL 5 MG PO TABS: 10.0000 mg | ORAL_TABLET | ORAL | Status: DC

## 2011-08-20 MED ORDER — LIDOCAINE HCL (PF) 1 % IJ SOLN
INTRAMUSCULAR | Status: AC
  Administered 2011-08-20: 4 mL
  Filled 2011-08-20: qty 5

## 2011-08-20 MED ORDER — MONTELUKAST SODIUM 10 MG PO TABS
10.0000 mg | ORAL_TABLET | Freq: Every day | ORAL | Status: DC
  Administered 2011-08-24: 10 mg via ORAL
  Filled 2011-08-20 (×6): qty 1

## 2011-08-20 MED ORDER — TRAZODONE HCL 100 MG PO TABS
100.0000 mg | ORAL_TABLET | Freq: Every day | ORAL | Status: DC
  Administered 2011-08-20: 100 mg via ORAL
  Filled 2011-08-20 (×6): qty 1

## 2011-08-20 MED ORDER — ENOXAPARIN SODIUM 40 MG/0.4ML ~~LOC~~ SOLN
40.0000 mg | SUBCUTANEOUS | Status: DC
  Administered 2011-08-20 – 2011-08-24 (×5): 40 mg via SUBCUTANEOUS
  Filled 2011-08-20 (×6): qty 0.4

## 2011-08-20 MED ORDER — EPINEPHRINE 0.3 MG/0.3ML IJ DEVI: 0.3000 mg | Freq: Once | INTRAMUSCULAR | Status: DC

## 2011-08-20 MED ORDER — ARIPIPRAZOLE 2 MG PO TABS
2.0000 mg | ORAL_TABLET | Freq: Every day | ORAL | Status: DC
  Administered 2011-08-23 – 2011-08-24 (×2): 2 mg via ORAL
  Filled 2011-08-20 (×6): qty 1
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
## 2011-08-20 NOTE — Progress Notes (Signed)
Orthopedic Tech Progress Note Patient Details:  [name] [name] 10-09-1950 [id number]  Type of Splint: Sugartong Splint Location: arm sling Splint Interventions: Application    Cammer, Mickie Bail 08/20/2011, 8:43 AM
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
## 2011-08-20 NOTE — Progress Notes (Signed)
I have seen and examined the patient and agree with the assessment and plans.  Chancelor Hardrick A. Marijke Guadiana  MD, FACS  
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
## 2011-08-20 NOTE — Progress Notes (Signed)
Orthopedic Tech Progress Note Patient Details:  [name] [name] [birth year] [id number]   arm sling   Cammer, Mickie Bail 08/20/2011, 8:44 AM
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
## 2011-08-20 NOTE — Progress Notes (Signed)
Patient ID: [name] [name], male   DOB: 1950/07/04, 61 y.o.   MRN: [id number] Post reduction xrays reviewed.  Good position .   Will need office follow up with me in one week.  spint needs to stay dry and kept on.  Office one week for repeat xrays and then cast application.  If he losses position then surgery may be indicated but he is in good position at this time.

## 2011-08-21 LAB — CBC
HCT: 33.9 % — ABNORMAL LOW (ref 39.0–52.0)
MCV: 93.6 fL (ref 78.0–100.0)
RBC: 3.62 MIL/uL — ABNORMAL LOW (ref 4.22–5.81)

## 2011-08-21 LAB — BASIC METABOLIC PANEL
BUN: 10 mg/dL (ref 6–23)
CO2: 24 mEq/L (ref 19–32)
Chloride: 109 mEq/L (ref 96–112)
Creatinine, Ser: 1.17 mg/dL (ref 0.50–1.35)

## 2011-08-21 MED ORDER — POTASSIUM CHLORIDE CRYS ER 20 MEQ PO TBCR
20.0000 meq | EXTENDED_RELEASE_TABLET | Freq: Three times a day (TID) | ORAL | Status: AC
  Administered 2011-08-21 – 2011-08-22 (×6): 20 meq via ORAL
  Filled 2011-08-21 (×7): qty 1

## 2011-08-21 MED ORDER — OXYCODONE HCL 5 MG PO TABS
10.0000 mg | ORAL_TABLET | ORAL | Status: DC
  Administered 2011-08-21 – 2011-08-25 (×26): 10 mg via ORAL
  Filled 2011-08-21 (×27): qty 2
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
## 2011-08-21 NOTE — Progress Notes (Signed)
Increase pain meds.   Chronic pain on top of acute pain.  Increase pain yet.
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
## 2011-08-22 MED ORDER — HYDROXYZINE HCL 25 MG PO TABS
50.0000 mg | ORAL_TABLET | ORAL | Status: DC | PRN
  Administered 2011-08-22 (×2): 50 mg via ORAL
  Administered 2011-08-23: 25 mg via ORAL
  Filled 2011-08-22: qty 2
  Filled 2011-08-22: qty 1
  Filled 2011-08-22 (×2): qty 2
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
## 2011-08-22 NOTE — Progress Notes (Signed)
I saw the patient, participated in the history, exam and medical decision making, and concur with the physician assistant's note above.  Sabri Teal M. Corney Knighton, MD, FACS General, Bariatric, & Minimally Invasive Surgery [facility]   

## 2011-08-23 MED ORDER — ACETAMINOPHEN 325 MG PO TABS
650.0000 mg | ORAL_TABLET | Freq: Four times a day (QID) | ORAL | Status: DC | PRN
  Administered 2011-08-23 – 2011-08-24 (×3): 650 mg via ORAL
  Filled 2011-08-23 (×3): qty 2

## 2011-08-23 MED ORDER — DIAZEPAM 5 MG PO TABS: 5.0000 mg | ORAL_TABLET | Freq: Once | ORAL | Status: DC
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
## 2011-08-24 ENCOUNTER — Other Ambulatory Visit: Payer: Self-pay

## 2011-08-24 LAB — CARDIAC PANEL(CRET KIN+CKTOT+MB+TROPI)
Relative Index: 1.6 (ref 0.0–2.5)
Total CK: 109 U/L (ref 7–232)

## 2011-08-24 MED ORDER — DIAZEPAM 5 MG PO TABS
10.0000 mg | ORAL_TABLET | Freq: Three times a day (TID) | ORAL | Status: DC
  Administered 2011-08-24 – 2011-08-25 (×3): 10 mg via ORAL
  Filled 2011-08-24 (×3): qty 2
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
## 2011-08-25 MED ORDER — TRAMADOL HCL 50 MG PO TABS: 100.0000 mg | ORAL_TABLET | Freq: Four times a day (QID) | ORAL | Status: AC

## 2011-08-25 MED ORDER — OXYCODONE-ACETAMINOPHEN 10-325 MG PO TABS: 1.0000 | ORAL_TABLET | ORAL | Status: DC | PRN
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
## 2011-08-25 NOTE — Discharge Summary (Signed)
Agree w above. Follow up as described above.

## 2011-08-25 NOTE — Progress Notes (Signed)
Occupational Therapy Treatment Patient Details Name: [name] [name] MRN: [id number] DOB: 1951-04-05 Today's Date: 08/25/2011  OT Assessment/Plan OT Assessment/Plan Comments on Treatment Session: Pt. with poor attention, and would not engage consistently with therapist despite multiple attempts.  Feel pt. will require 24 hour supervision at discharge OT Plan: Discharge plan remains appropriate OT Frequency: Min 2X/week Follow Up Recommendations: Other (comment);Home health OT;Supervision/Assistance - 24 hour Equipment Recommended: None recommended by OT OT Goals ADL Goals ADL Goal: Grooming - Progress: Not progressing ADL Goal: Upper Body Bathing - Progress: Not progressing ADL Goal: Lower Body Bathing - Progress: Not progressing ADL Goal: Upper Body Dressing - Progress: Not progressing ADL Goal: Lower Body Dressing - Progress: Not progressing Miscellaneous OT Goals OT Goal: Miscellaneous Goal #1 - Progress: Not progressing  OT Treatment Precautions/Restrictions  Restrictions RUE Weight Bearing: Non weight bearing Other Position/Activity Restrictions: NWB right wrist   ADL ADL ADL Comments: Pt. lying in bed upon therapist's enterance.  Pt initially stating urinal leaking.  Pt. assisted with urinal.  Pt then asked therapist to assist him with dialing the phone.  When asked to provide the phone number, pt. would not respond despite 5 prompts.  Pt. then noted to moan and grab Rt. side.  When asked if he was in pain, he initially did not answer, despite question being reworded, and asked repetitively.  Pt. finally acknowledged pain, and stated 10/10.  RN notified, and when she arrived, pt. denied pain, but when she left, pt. states his pain is 10.10.  Pt. unable to provide info. of who will be available to assist him, he states that he is unsure and needs to make some phone calls that he can't make here.  Attempted to assit pt. with making phone calls, but pt. reverts to not answering  questions, or providing intermittent unrelated info.  Attempted to move pt. to EOB, pt. continued with same behaviors with no attempt to move transition to EOB despite mulitple cues and requests.  Pt's friend phoned in the midst of this, and he carried full, appropriate conversation with her, and requested she provide transportation home this pm.  Pt. did mention that he needs a phone, and lots of stuff to figure out.  Discussed option of SNF level rehab, and pt. adamantely refused.  Pt. continued to stop talking, and made no attempt to work with OT.  SW in, and provided with this information Mobility    Exercises    End of Session OT - End of Session Activity Tolerance: Patient limited by pain;Other (comment) (Pt. refusal to actively engage) Patient left: in bed;with call bell in reach Nurse Communication: Other (comment) (pain, and behavioral issues) General Behavior During Session: Other (comment) (see ADL comments) Cognition: Impaired Cognitive Impairment: Unsure if behaviors are due to cognitive impairment, or pre-morbid behavioral issues.  Based on normal conversation pt. engaged with his friend,  this appears to be behavioral.    Virgina Organ, [name]  08/25/2011, 1:00 PM

## 2011-08-25 NOTE — Progress Notes (Signed)
Agree with above. Pt with chronic pain issues in addition to new issues.

## 2011-08-25 NOTE — Progress Notes (Signed)
Patient is d/c today with assessments remaining stable, his neighbor came to pick him home. D/c papers and prescription given to neighbor. Awaiting for volunteers to be wheeled down.
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
## 2011-08-25 NOTE — Progress Notes (Signed)
PT with HHPT/OT needs for discharge today. Pt reports that he has used [facility] in the past. Offered him the choice of using another agency (list not presented as pt is blind), and he said that he would rather stay with [facility].  Confirmed that the address and home phone number in Epic is correct. Face to face form sent to MD for signature.
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
## 2011-08-27 ENCOUNTER — Telehealth: Payer: Self-pay | Admitting: Orthopedic Surgery

## 2011-08-27 NOTE — Telephone Encounter (Signed)
Tried to return call -- got no answer and no answering machine.

## 2011-08-28 ENCOUNTER — Telehealth (INDEPENDENT_AMBULATORY_CARE_PROVIDER_SITE_OTHER): Payer: Self-pay | Admitting: Physician Assistant

## 2011-08-28 NOTE — Telephone Encounter (Signed)
Attempted to return patient's call. The phone was allowed to ring for several minutes, but no one ever answered and there was no answering machine.

## 2011-09-06 ENCOUNTER — Emergency Department (HOSPITAL_COMMUNITY): Payer: Medicaid Other

## 2011-09-06 ENCOUNTER — Emergency Department (HOSPITAL_COMMUNITY)
Admission: EM | Admit: 2011-09-06 | Discharge: 2011-09-07 | Disposition: A | Discharge status: Home / Self Care | Payer: Medicaid Other | Attending: Emergency Medicine | Admitting: Emergency Medicine

## 2011-09-06 ENCOUNTER — Encounter (HOSPITAL_COMMUNITY): Payer: Self-pay | Admitting: General Practice

## 2011-09-06 DIAGNOSIS — Z79899 Other long term (current) drug therapy: Secondary | ICD-10-CM | POA: Insufficient documentation

## 2011-09-06 DIAGNOSIS — J449 Chronic obstructive pulmonary disease, unspecified: Secondary | ICD-10-CM | POA: Insufficient documentation

## 2011-09-06 DIAGNOSIS — J45901 Unspecified asthma with (acute) exacerbation: Secondary | ICD-10-CM | POA: Insufficient documentation

## 2011-09-06 DIAGNOSIS — R059 Cough, unspecified: Secondary | ICD-10-CM | POA: Insufficient documentation

## 2011-09-06 DIAGNOSIS — R0602 Shortness of breath: Secondary | ICD-10-CM | POA: Insufficient documentation

## 2011-09-06 DIAGNOSIS — K219 Gastro-esophageal reflux disease without esophagitis: Secondary | ICD-10-CM | POA: Insufficient documentation

## 2011-09-06 DIAGNOSIS — H548 Legal blindness, as defined in USA: Secondary | ICD-10-CM | POA: Insufficient documentation

## 2011-09-06 DIAGNOSIS — J4489 Other specified chronic obstructive pulmonary disease: Secondary | ICD-10-CM | POA: Insufficient documentation

## 2011-09-06 DIAGNOSIS — R05 Cough: Secondary | ICD-10-CM | POA: Insufficient documentation

## 2011-09-06 DIAGNOSIS — Z8619 Personal history of other infectious and parasitic diseases: Secondary | ICD-10-CM | POA: Insufficient documentation

## 2011-09-06 DIAGNOSIS — J3489 Other specified disorders of nose and nasal sinuses: Secondary | ICD-10-CM | POA: Insufficient documentation

## 2011-09-06 DIAGNOSIS — R6889 Other general symptoms and signs: Secondary | ICD-10-CM | POA: Insufficient documentation

## 2011-09-06 DIAGNOSIS — I10 Essential (primary) hypertension: Secondary | ICD-10-CM | POA: Insufficient documentation

## 2011-09-06 HISTORY — DX: Other seasonal allergic rhinitis: J30.2

## 2011-09-06 MED ORDER — ALBUTEROL SULFATE HFA 108 (90 BASE) MCG/ACT IN AERS
2.0000 | INHALATION_SPRAY | RESPIRATORY_TRACT | Status: AC
  Administered 2011-09-06: 2 via RESPIRATORY_TRACT
  Filled 2011-09-06: qty 6.7

## 2011-09-06 MED ORDER — ALBUTEROL SULFATE (5 MG/ML) 0.5% IN NEBU
5.0000 mg | INHALATION_SOLUTION | Freq: Once | RESPIRATORY_TRACT | Status: AC
  Administered 2011-09-06: 5 mg via RESPIRATORY_TRACT
  Filled 2011-09-06: qty 1

## 2011-09-06 MED ORDER — PREDNISONE 20 MG PO TABS
60.0000 mg | ORAL_TABLET | Freq: Once | ORAL | Status: AC
  Administered 2011-09-06: 60 mg via ORAL
  Filled 2011-09-06: qty 1

## 2011-09-06 MED ORDER — PREDNISONE 20 MG PO TABS: 20.0000 mg | ORAL_TABLET | Freq: Every day | ORAL | Status: DC

## 2011-09-06 MED ORDER — IPRATROPIUM BROMIDE 0.02 % IN SOLN
0.5000 mg | Freq: Once | RESPIRATORY_TRACT | Status: AC
  Administered 2011-09-06: 0.5 mg via RESPIRATORY_TRACT
  Filled 2011-09-06: qty 2.5

## 2011-09-06 MED ORDER — OXYCODONE-ACETAMINOPHEN 5-325 MG PO TABS: ORAL_TABLET | ORAL | Status: DC

## 2011-09-06 MED ORDER — OXYCODONE-ACETAMINOPHEN 5-325 MG PO TABS
2.0000 | ORAL_TABLET | Freq: Once | ORAL | Status: AC
  Administered 2011-09-06: 2 via ORAL
  Filled 2011-09-06: qty 2

## 2011-09-06 MED ORDER — ALBUTEROL SULFATE (5 MG/ML) 0.5% IN NEBU
INHALATION_SOLUTION | RESPIRATORY_TRACT | Status: AC
  Administered 2011-09-06: 19:00:00
  Filled 2011-09-06: qty 1
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
## 2011-09-06 NOTE — ED Notes (Signed)
[id number]<YQ> Expected date:<BR> Expected time: 6:31 PM<BR> Means of arrival:Ambulance<BR> Comments:<BR> M140 -- Wheezing

## 2011-09-06 NOTE — Discharge Instructions (Signed)
RESOURCE GUIDE  Dental Problems  Patients with Medicaid: [facility] [phone]                                           [phone] [address] Phone:  [phone]                                                  Phone:  [phone]  If unable to pay or uninsured, contact:  [facility] or [facility] to become qualified for the adult dental clinic.  Chronic Pain Problems Contact [facility]  [phone] Patients need to be referred by their primary care doctor.  Insufficient Money for Medicine Contact [employer]:  call "211" or [facility] [phone].  No Primary Care Doctor Call [facility]  [phone] Other agencies that provide inexpensive medical care    [facility]  [phone]    [facility]  [phone]    [facility]  [phone]    [facility]  [phone]    [facility]  [phone]    [facility]  [phone]  Psychological Services [facility]  [phone] [facility] Services  [phone] [facility]   [phone] (emergency services [phone])  Substance Abuse Resources [facility]  [phone] [facility] [phone] [facility] [place] [phone] Floydene Flock [phone] Residential & Outpatient Substance Abuse Program  [phone]  Abuse/Neglect [facility] Child Abuse Hotline 680-584-2988 [facility] Child Abuse Hotline 818-602-0565 (After Hours)  Emergency Shelter [facility] 3396045710  Maternity Homes [facility] at the [place] [facility] [phone] [facility] [phone]  MRSA Hotline #:   8384238250    [facility] Resources  [facility]     [employer]                          [facility] Dept. [address] [place]                       [address]      [address]  Blondell Reveal Phone:  [phone]                                   Phone:  [phone]                 Phone:  [phone]  [facility] Phone:  [phone]  [facility] Child Abuse Hotline [phone] [phone] (After Hours)   Take the prescriptions as directed.  Use your albuterol inhaler (2 to 4 puffs) or your albuterol nebulizer (1 unit dose) every 4 hours for the next 7 days, then as needed for cough, wheezing, or shortness of breath.  Call your regular medical doctor on Monday to schedule a follow up appointment within the next 3 days.  Return to the Emergency Department immediately sooner if worsening.
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
## 2011-09-06 NOTE — ED Notes (Signed)
Pt brought by EMS from home. Pt has had cough and congestion for 1 month.  Cough and congestion have become increasingly worse over last 2 days. Had diffuse wheezing, 87% on RA upon EMS arrival at home. Pt given nebulizer by EMS, sats 100% post-nebulizer. Pt breath sounds clear at this time.

## 2011-09-07 MED ORDER — BACITRACIN ZINC 500 UNIT/GM EX OINT
TOPICAL_OINTMENT | CUTANEOUS | Status: AC
  Filled 2011-09-07: qty 1.8

## 2011-09-07 MED ORDER — HYDROCODONE-ACETAMINOPHEN 5-325 MG PO TABS
1.0000 | ORAL_TABLET | Freq: Once | ORAL | Status: DC
  Filled 2011-09-07: qty 1

## 2011-09-07 MED ORDER — OXYCODONE-ACETAMINOPHEN 5-325 MG PO TABS
2.0000 | ORAL_TABLET | Freq: Once | ORAL | Status: AC
  Administered 2011-09-07: 2 via ORAL
  Filled 2011-09-07: qty 2

## 2011-09-07 MED ORDER — IBUPROFEN 200 MG PO TABS
400.0000 mg | ORAL_TABLET | Freq: Once | ORAL | Status: AC | PRN
  Administered 2011-09-07: 400 mg via ORAL
  Filled 2011-09-07: qty 2

## 2011-09-07 NOTE — ED Notes (Signed)
CSW met with pt per request of RN. Pt informed Clinical research associate that he was recently discharged from [facility] and that he feels he was not ready for discharge. Pt explained that he currently receives home health and aid assistance through [facility]. Pt reported to writer that he desired to be discharged earlier before his right arm was wrapped. "I was ready to go home but my now my arm is in some pain. I need my pain medication before I'm discharged." Pt inquired if it would be possible for him to remain in the ED until tomorrow. CSW explained to pt that MD has medically cleared pt for discharge and that pt cannot remain in ED without medical necessity. CSW inquired about pt's living environment and personal aid assistance services. Pt stated that he has a personal aid that transports pt to appointments and provides care throughout the week. Pt stated that he has a Child psychotherapist @ [facility] who coordinates his services. Pt inquired what other services of assistance are available to him. CSW discussed the option of patient receiving Private Duty assistance through private pay. CSW also had encouraged pt to follow up with his social worker at [facility] tomorrow to determine what other services he may be eligible for. Pt verbalized his understanding and will be provided with PTAR transportation to return home.

## 2011-09-07 NOTE — ED Notes (Signed)
Tukey Sandwich given

## 2011-09-07 NOTE — ED Notes (Signed)
Pt waiting to be seen by the social worker in the am. Pt does not have means and places to go to. Pt held in the er until situation discussed with SW. Pt aware of wait and daly.

## 2011-09-10 ENCOUNTER — Other Ambulatory Visit (HOSPITAL_COMMUNITY): Payer: Self-pay | Admitting: Orthopedic Surgery

## 2011-09-11 ENCOUNTER — Encounter (HOSPITAL_COMMUNITY): Payer: Self-pay | Admitting: Pharmacy Technician

## 2011-09-11 ENCOUNTER — Encounter (HOSPITAL_COMMUNITY): Payer: Self-pay | Admitting: *Deleted

## 2011-09-11 ENCOUNTER — Ambulatory Visit: Payer: Medicaid Other | Admitting: Adult Health

## 2011-09-11 MED ORDER — CLINDAMYCIN PHOSPHATE 600 MG/50ML IV SOLN
600.0000 mg | INTRAVENOUS | Status: AC
  Administered 2011-09-12: 600 mg via INTRAVENOUS
  Filled 2011-09-11: qty 50

## 2011-09-11 NOTE — Progress Notes (Signed)
Pt wants a private room, so partner can stay- he needs assist with feeding and bathhing.  Pt also wants Dr. Delford Field to be notified so he can  "check on lungs."

## 2011-09-12 ENCOUNTER — Encounter (HOSPITAL_COMMUNITY): Payer: Self-pay | Admitting: Surgery

## 2011-09-12 ENCOUNTER — Encounter (HOSPITAL_COMMUNITY)
Admission: RE | Disposition: A | Discharge status: Home Health | Payer: Self-pay | Source: Ambulatory Visit | Attending: Orthopedic Surgery

## 2011-09-12 ENCOUNTER — Ambulatory Visit (HOSPITAL_COMMUNITY): Payer: Medicaid Other | Admitting: Anesthesiology

## 2011-09-12 ENCOUNTER — Encounter (HOSPITAL_COMMUNITY): Payer: Self-pay | Admitting: Anesthesiology

## 2011-09-12 ENCOUNTER — Ambulatory Visit (HOSPITAL_COMMUNITY): Payer: Medicaid Other

## 2011-09-12 ENCOUNTER — Inpatient Hospital Stay (HOSPITAL_COMMUNITY)
Admission: RE | Admit: 2011-09-12 | Discharge: 2011-09-15 | DRG: 512 | Disposition: A | Discharge status: Home Health | Payer: Medicaid Other | Source: Ambulatory Visit | Attending: Orthopedic Surgery | Admitting: Orthopedic Surgery

## 2011-09-12 DIAGNOSIS — M479 Spondylosis, unspecified: Secondary | ICD-10-CM | POA: Diagnosis present

## 2011-09-12 DIAGNOSIS — B1920 Unspecified viral hepatitis C without hepatic coma: Secondary | ICD-10-CM | POA: Diagnosis present

## 2011-09-12 DIAGNOSIS — S42309S Unspecified fracture of shaft of humerus, unspecified arm, sequela: Secondary | ICD-10-CM

## 2011-09-12 DIAGNOSIS — IMO0002 Reserved for concepts with insufficient information to code with codable children: Principal | ICD-10-CM | POA: Diagnosis present

## 2011-09-12 DIAGNOSIS — Z87891 Personal history of nicotine dependence: Secondary | ICD-10-CM

## 2011-09-12 DIAGNOSIS — K219 Gastro-esophageal reflux disease without esophagitis: Secondary | ICD-10-CM | POA: Diagnosis present

## 2011-09-12 DIAGNOSIS — I129 Hypertensive chronic kidney disease with stage 1 through stage 4 chronic kidney disease, or unspecified chronic kidney disease: Secondary | ICD-10-CM | POA: Diagnosis present

## 2011-09-12 DIAGNOSIS — N189 Chronic kidney disease, unspecified: Secondary | ICD-10-CM | POA: Diagnosis present

## 2011-09-12 DIAGNOSIS — S52501A Unspecified fracture of the lower end of right radius, initial encounter for closed fracture: Secondary | ICD-10-CM

## 2011-09-12 DIAGNOSIS — M899 Disorder of bone, unspecified: Secondary | ICD-10-CM | POA: Diagnosis present

## 2011-09-12 DIAGNOSIS — H548 Legal blindness, as defined in USA: Secondary | ICD-10-CM | POA: Diagnosis present

## 2011-09-12 DIAGNOSIS — K449 Diaphragmatic hernia without obstruction or gangrene: Secondary | ICD-10-CM | POA: Diagnosis present

## 2011-09-12 DIAGNOSIS — F341 Dysthymic disorder: Secondary | ICD-10-CM | POA: Diagnosis present

## 2011-09-12 DIAGNOSIS — J449 Chronic obstructive pulmonary disease, unspecified: Secondary | ICD-10-CM | POA: Diagnosis present

## 2011-09-12 DIAGNOSIS — Z79899 Other long term (current) drug therapy: Secondary | ICD-10-CM

## 2011-09-12 DIAGNOSIS — X58XXXS Exposure to other specified factors, sequela: Secondary | ICD-10-CM

## 2011-09-12 DIAGNOSIS — J4489 Other specified chronic obstructive pulmonary disease: Secondary | ICD-10-CM | POA: Diagnosis present

## 2011-09-12 HISTORY — DX: Unspecified multiple injuries, initial encounter: T07.XXXA

## 2011-09-12 HISTORY — DX: Personal history of other diseases of the digestive system: Z87.19

## 2011-09-12 HISTORY — DX: Anxiety disorder, unspecified: F41.9

## 2011-09-12 HISTORY — DX: Unspecified fracture of facial bones, initial encounter for closed fracture: S02.92XA

## 2011-09-12 HISTORY — DX: Pneumonia, unspecified organism: J18.9

## 2011-09-12 HISTORY — PX: ORIF DISTAL RADIUS FRACTURE: SUR927

## 2011-09-12 HISTORY — DX: Depression, unspecified: F32.A

## 2011-09-12 HISTORY — DX: Major depressive disorder, single episode, unspecified: F32.9

## 2011-09-12 HISTORY — DX: Unspecified osteoarthritis, unspecified site: M19.90

## 2011-09-12 LAB — COMPREHENSIVE METABOLIC PANEL
Alkaline Phosphatase: 134 U/L — ABNORMAL HIGH (ref 39–117)
BUN: 12 mg/dL (ref 6–23)
CO2: 23 mEq/L (ref 19–32)
GFR calc Af Amer: 73 mL/min — ABNORMAL LOW (ref >90)
GFR calc non Af Amer: 63 mL/min — ABNORMAL LOW (ref >90)
Glucose, Bld: 86 mg/dL (ref 70–99)
Potassium: 3.6 mEq/L (ref 3.5–5.1)
Total Protein: 8 g/dL (ref 6.0–8.3)

## 2011-09-12 LAB — SURGICAL PCR SCREEN: Staphylococcus aureus: POSITIVE — AB

## 2011-09-12 LAB — CBC
HCT: 39.6 % (ref 39.0–52.0)
Hemoglobin: 13.8 g/dL (ref 13.0–17.0)
MCH: 31.6 pg (ref 26.0–34.0)
MCHC: 34.8 g/dL (ref 30.0–36.0)

## 2011-09-12 LAB — PROTIME-INR: Prothrombin Time: 15.2 seconds (ref 11.6–15.2)

## 2011-09-12 SURGERY — OPEN REDUCTION INTERNAL FIXATION (ORIF) DISTAL RADIUS FRACTURE
Anesthesia: General | Site: Wrist | Laterality: Right | Wound class: Clean

## 2011-09-12 MED ORDER — SERTRALINE HCL 100 MG PO TABS
200.0000 mg | ORAL_TABLET | Freq: Every day | ORAL | Status: DC
  Administered 2011-09-12 – 2011-09-14 (×3): 200 mg via ORAL
  Filled 2011-09-12 (×4): qty 2

## 2011-09-12 MED ORDER — SODIUM CHLORIDE 0.9 % IV SOLN
INTRAVENOUS | Status: DC
  Administered 2011-09-12: 21:00:00 via INTRAVENOUS

## 2011-09-12 MED ORDER — MIDAZOLAM HCL 2 MG/2ML IJ SOLN
2.0000 mg | Freq: Once | INTRAMUSCULAR | Status: AC
  Administered 2011-09-12: 0.5 mg via INTRAVENOUS

## 2011-09-12 MED ORDER — ONDANSETRON HCL 4 MG/2ML IJ SOLN
4.0000 mg | Freq: Four times a day (QID) | INTRAMUSCULAR | Status: DC | PRN
  Administered 2011-09-14: 4 mg via INTRAVENOUS
  Filled 2011-09-12: qty 2

## 2011-09-12 MED ORDER — HYDROCODONE-ACETAMINOPHEN 7.5-325 MG PO TABS
1.0000 | ORAL_TABLET | ORAL | Status: DC | PRN
  Filled 2011-09-12: qty 2

## 2011-09-12 MED ORDER — FENTANYL CITRATE 0.05 MG/ML IJ SOLN
INTRAMUSCULAR | Status: DC | PRN
  Administered 2011-09-12: 50 ug via INTRAVENOUS
  Administered 2011-09-12: 100 ug via INTRAVENOUS
  Administered 2011-09-12 (×2): 50 ug via INTRAVENOUS

## 2011-09-12 MED ORDER — PROPOFOL 10 MG/ML IV EMUL
INTRAVENOUS | Status: DC | PRN
  Administered 2011-09-12: 130 mg via INTRAVENOUS

## 2011-09-12 MED ORDER — CLINDAMYCIN PHOSPHATE 600 MG/50ML IV SOLN
600.0000 mg | Freq: Four times a day (QID) | INTRAVENOUS | Status: AC
  Administered 2011-09-12 – 2011-09-13 (×3): 600 mg via INTRAVENOUS
  Filled 2011-09-12 (×3): qty 50

## 2011-09-12 MED ORDER — MIDAZOLAM HCL 2 MG/2ML IJ SOLN
0.5000 mg | Freq: Once | INTRAMUSCULAR | Status: AC
  Administered 2011-09-12: 0.5 mg via INTRAVENOUS

## 2011-09-12 MED ORDER — HYDROXYZINE HCL 25 MG PO TABS
25.0000 mg | ORAL_TABLET | Freq: Once | ORAL | Status: AC
  Administered 2011-09-12: 25 mg via ORAL
  Filled 2011-09-12 (×2): qty 1

## 2011-09-12 MED ORDER — GLYCOPYRROLATE 0.2 MG/ML IJ SOLN
INTRAMUSCULAR | Status: DC | PRN
  Administered 2011-09-12: .5 mg via INTRAVENOUS

## 2011-09-12 MED ORDER — NEOSTIGMINE METHYLSULFATE 1 MG/ML IJ SOLN
INTRAMUSCULAR | Status: DC | PRN
  Administered 2011-09-12: 4 mg via INTRAVENOUS

## 2011-09-12 MED ORDER — PANTOPRAZOLE SODIUM 40 MG PO TBEC
40.0000 mg | DELAYED_RELEASE_TABLET | Freq: Every day | ORAL | Status: DC
  Administered 2011-09-12 – 2011-09-15 (×4): 40 mg via ORAL
  Filled 2011-09-12 (×5): qty 1

## 2011-09-12 MED ORDER — PREDNISOLONE ACETATE 1 % OP SUSP
1.0000 [drp] | Freq: Three times a day (TID) | OPHTHALMIC | Status: DC
  Administered 2011-09-12 – 2011-09-15 (×8): 1 [drp] via OPHTHALMIC
  Filled 2011-09-12: qty 1

## 2011-09-12 MED ORDER — 0.9 % SODIUM CHLORIDE (POUR BTL) OPTIME
TOPICAL | Status: DC | PRN
  Administered 2011-09-12: 1000 mL

## 2011-09-12 MED ORDER — DIAZEPAM 5 MG PO TABS
10.0000 mg | ORAL_TABLET | Freq: Three times a day (TID) | ORAL | Status: DC | PRN
  Administered 2011-09-12 – 2011-09-15 (×6): 10 mg via ORAL
  Filled 2011-09-12 (×3): qty 1
  Filled 2011-09-12 (×2): qty 2
  Filled 2011-09-12: qty 1
  Filled 2011-09-12 (×2): qty 2

## 2011-09-12 MED ORDER — FLUTICASONE-SALMETEROL 500-50 MCG/DOSE IN AEPB
1.0000 | INHALATION_SPRAY | Freq: Two times a day (BID) | RESPIRATORY_TRACT | Status: DC
  Administered 2011-09-12 – 2011-09-14 (×5): 1 via RESPIRATORY_TRACT
  Filled 2011-09-12: qty 14

## 2011-09-12 MED ORDER — MIDAZOLAM HCL 2 MG/2ML IJ SOLN
1.0000 mg | Freq: Once | INTRAMUSCULAR | Status: AC
  Administered 2011-09-12: 1 mg via INTRAVENOUS

## 2011-09-12 MED ORDER — PREDNISONE 20 MG PO TABS: 20.0000 mg | ORAL_TABLET | Freq: Every day | ORAL | Status: DC

## 2011-09-12 MED ORDER — DIAZEPAM 5 MG PO TABS: 10.0000 mg | ORAL_TABLET | Freq: Three times a day (TID) | ORAL | Status: DC

## 2011-09-12 MED ORDER — HYDROMORPHONE HCL PF 1 MG/ML IJ SOLN
0.5000 mg | INTRAMUSCULAR | Status: DC | PRN
  Administered 2011-09-13: 1 mg via INTRAVENOUS
  Filled 2011-09-12 (×2): qty 1

## 2011-09-12 MED ORDER — TRAZODONE HCL 100 MG PO TABS
100.0000 mg | ORAL_TABLET | Freq: Every evening | ORAL | Status: DC | PRN
  Filled 2011-09-12 (×2): qty 1

## 2011-09-12 MED ORDER — ONDANSETRON HCL 4 MG PO TABS: 4.0000 mg | ORAL_TABLET | Freq: Four times a day (QID) | ORAL | Status: DC | PRN

## 2011-09-12 MED ORDER — MUPIROCIN 2 % EX OINT
TOPICAL_OINTMENT | Freq: Two times a day (BID) | CUTANEOUS | Status: DC
  Administered 2011-09-12: 11:00:00 via NASAL
  Filled 2011-09-12: qty 22

## 2011-09-12 MED ORDER — MIDAZOLAM HCL 5 MG/5ML IJ SOLN
INTRAMUSCULAR | Status: DC | PRN
  Administered 2011-09-12: 1.5 mg via INTRAVENOUS

## 2011-09-12 MED ORDER — LIDOCAINE HCL (CARDIAC) 20 MG/ML IV SOLN
INTRAVENOUS | Status: DC | PRN
  Administered 2011-09-12: 40 mg via INTRAVENOUS

## 2011-09-12 MED ORDER — ONDANSETRON HCL 4 MG/2ML IJ SOLN
INTRAMUSCULAR | Status: DC | PRN
  Administered 2011-09-12: 4 mg via INTRAVENOUS

## 2011-09-12 MED ORDER — METOCLOPRAMIDE HCL 5 MG/ML IJ SOLN: 5.0000 mg | Freq: Three times a day (TID) | INTRAMUSCULAR | Status: DC | PRN

## 2011-09-12 MED ORDER — HYDROXYZINE HCL 25 MG PO TABS
25.0000 mg | ORAL_TABLET | Freq: Three times a day (TID) | ORAL | Status: DC | PRN
  Administered 2011-09-12 – 2011-09-15 (×3): 25 mg via ORAL
  Filled 2011-09-12 (×3): qty 1

## 2011-09-12 MED ORDER — ARIPIPRAZOLE 2 MG PO TABS
2.0000 mg | ORAL_TABLET | Freq: Every day | ORAL | Status: DC
  Administered 2011-09-12 – 2011-09-14 (×3): 2 mg via ORAL
  Filled 2011-09-12 (×4): qty 1

## 2011-09-12 MED ORDER — OXYCODONE-ACETAMINOPHEN 5-325 MG PO TABS
1.0000 | ORAL_TABLET | ORAL | Status: DC | PRN
  Administered 2011-09-12 – 2011-09-15 (×14): 2 via ORAL
  Filled 2011-09-12 (×14): qty 2

## 2011-09-12 MED ORDER — SODIUM CHLORIDE 0.9 % IV SOLN
0.4000 ug/kg/h | INTRAVENOUS | Status: AC
  Administered 2011-09-12: 17:00:00 via INTRAVENOUS
  Filled 2011-09-12: qty 2

## 2011-09-12 MED ORDER — LACTATED RINGERS IV SOLN
INTRAVENOUS | Status: DC | PRN
  Administered 2011-09-12 (×2): via INTRAVENOUS

## 2011-09-12 MED ORDER — ALBUTEROL SULFATE HFA 108 (90 BASE) MCG/ACT IN AERS
2.0000 | INHALATION_SPRAY | RESPIRATORY_TRACT | Status: DC | PRN
  Filled 2011-09-12: qty 6.7

## 2011-09-12 MED ORDER — MONTELUKAST SODIUM 10 MG PO TABS
10.0000 mg | ORAL_TABLET | Freq: Every day | ORAL | Status: DC
  Administered 2011-09-12 – 2011-09-14 (×3): 10 mg via ORAL
  Filled 2011-09-12 (×4): qty 1

## 2011-09-12 MED ORDER — HYDROMORPHONE HCL PF 1 MG/ML IJ SOLN
0.2500 mg | INTRAMUSCULAR | Status: DC | PRN
  Administered 2011-09-12 (×2): 0.5 mg via INTRAVENOUS

## 2011-09-12 MED ORDER — METOCLOPRAMIDE HCL 10 MG PO TABS: 5.0000 mg | ORAL_TABLET | Freq: Three times a day (TID) | ORAL | Status: DC | PRN

## 2011-09-12 MED ORDER — ROCURONIUM BROMIDE 100 MG/10ML IV SOLN
INTRAVENOUS | Status: DC | PRN
  Administered 2011-09-12: 30 mg via INTRAVENOUS

## 2011-09-12 MED ORDER — MUPIROCIN 2 % EX OINT
TOPICAL_OINTMENT | CUTANEOUS | Status: AC
  Filled 2011-09-12: qty 22

## 2011-09-12 MED ORDER — FLUTICASONE PROPIONATE 50 MCG/ACT NA SUSP
1.0000 | Freq: Every day | NASAL | Status: DC
  Administered 2011-09-14 – 2011-09-15 (×2): 1 via NASAL
  Filled 2011-09-12: qty 16

## 2011-09-12 MED ORDER — ALBUTEROL SULFATE (5 MG/ML) 0.5% IN NEBU
2.5000 mg | INHALATION_SOLUTION | Freq: Once | RESPIRATORY_TRACT | Status: AC
  Administered 2011-09-12: 2.5 mg via RESPIRATORY_TRACT
  Filled 2011-09-12: qty 0.5

## 2011-09-12 MED ORDER — LACTATED RINGERS IV SOLN
INTRAVENOUS | Status: DC
  Administered 2011-09-12: 14:00:00 via INTRAVENOUS

## 2011-09-12 MED ORDER — ALBUTEROL SULFATE (5 MG/ML) 0.5% IN NEBU
2.5000 mg | INHALATION_SOLUTION | RESPIRATORY_TRACT | Status: DC | PRN
  Administered 2011-09-15: 2.5 mg via RESPIRATORY_TRACT
  Filled 2011-09-12: qty 0.5

## 2011-09-12 MED ORDER — DEXMEDETOMIDINE HCL 100 MCG/ML IV SOLN: 0.5000 ug/kg | Freq: Once | INTRAVENOUS | Status: DC

## 2011-09-12 MED ORDER — ONDANSETRON HCL 4 MG/2ML IJ SOLN: 4.0000 mg | Freq: Four times a day (QID) | INTRAMUSCULAR | Status: DC | PRN

## 2011-09-12 SURGICAL SUPPLY — 52 items
BANDAGE GAUZE ELAST BULKY 4 IN (GAUZE/BANDAGES/DRESSINGS) IMPLANT
BIT DRILL 2 FAST STEP (BIT) ×2 IMPLANT
BIT DRILL 2.5X4 QC (BIT) ×2 IMPLANT
BNDG COHESIVE 4X5 TAN STRL (GAUZE/BANDAGES/DRESSINGS) ×2 IMPLANT
BNDG ESMARK 4X9 LF (GAUZE/BANDAGES/DRESSINGS) ×2 IMPLANT
CLOTH BEACON ORANGE TIMEOUT ST (SAFETY) ×2 IMPLANT
COVER SURGICAL LIGHT HANDLE (MISCELLANEOUS) ×2 IMPLANT
CUFF TOURNIQUET SINGLE 18IN (TOURNIQUET CUFF) IMPLANT
CUFF TOURNIQUET SINGLE 24IN (TOURNIQUET CUFF) IMPLANT
DRAPE INCISE IOBAN 66X45 STRL (DRAPES) IMPLANT
DRAPE OEC MINIVIEW 54X84 (DRAPES) ×2 IMPLANT
DRAPE U-SHAPE 47X51 STRL (DRAPES) ×2 IMPLANT
DRSG EMULSION OIL 3X3 NADH (GAUZE/BANDAGES/DRESSINGS) IMPLANT
DRSG PAD ABDOMINAL 8X10 ST (GAUZE/BANDAGES/DRESSINGS) IMPLANT
DURAPREP 26ML APPLICATOR (WOUND CARE) ×2 IMPLANT
ELECT REM PT RETURN 9FT ADLT (ELECTROSURGICAL) ×2
ELECTRODE REM PT RTRN 9FT ADLT (ELECTROSURGICAL) ×1 IMPLANT
GAUZE SPONGE 4X4 16PLY XRAY LF (GAUZE/BANDAGES/DRESSINGS) ×2 IMPLANT
GLOVE BIOGEL PI IND STRL 9 (GLOVE) ×1 IMPLANT
GLOVE BIOGEL PI INDICATOR 9 (GLOVE) ×1
GLOVE SURG ORTHO 9.0 STRL STRW (GLOVE) ×2 IMPLANT
GOWN SRG XL XLNG 56XLVL 4 (GOWN DISPOSABLE) ×2 IMPLANT
GOWN STRL NON-REIN XL XLG LVL4 (GOWN DISPOSABLE) ×2
KIT BASIN OR (CUSTOM PROCEDURE TRAY) ×2 IMPLANT
KIT ROOM TURNOVER OR (KITS) ×2 IMPLANT
MANIFOLD NEPTUNE II (INSTRUMENTS) ×2 IMPLANT
NS IRRIG 1000ML POUR BTL (IV SOLUTION) ×2 IMPLANT
PACK ORTHO EXTREMITY (CUSTOM PROCEDURE TRAY) ×2 IMPLANT
PAD ARMBOARD 7.5X6 YLW CONV (MISCELLANEOUS) ×4 IMPLANT
PAD CAST 4YDX4 CTTN HI CHSV (CAST SUPPLIES) IMPLANT
PADDING CAST COTTON 4X4 STRL (CAST SUPPLIES)
PLATE STAN 24.4X59.5 RT (Plate) ×2 IMPLANT
SCREW CORT 3.5X14 LNG (Screw) ×4 IMPLANT
SCREW CORT 3.5X16 LNG (Screw) ×2 IMPLANT
SCREW PEG LOCK 2.5X18 (Peg) ×2 IMPLANT
SCREW PEG LOCK 2.5X20 (Peg) ×4 IMPLANT
SCREW PEG LOCK 2.5X24 (Peg) ×4 IMPLANT
SPONGE GAUZE 4X4 12PLY (GAUZE/BANDAGES/DRESSINGS) IMPLANT
SPONGE LAP 18X18 X RAY DECT (DISPOSABLE) ×2 IMPLANT
STAPLER VISISTAT 35W (STAPLE) ×2 IMPLANT
STRIP CLOSURE SKIN 1/2X4 (GAUZE/BANDAGES/DRESSINGS) ×2 IMPLANT
SUCTION FRAZIER TIP 10 FR DISP (SUCTIONS) ×2 IMPLANT
SUT PROLENE 3 0 PS 1 (SUTURE) ×2 IMPLANT
SUT VIC AB 2-0 CTB1 (SUTURE) IMPLANT
SUT VIC AB 3-0 X1 27 (SUTURE) ×2 IMPLANT
SUT VICRYL 4-0 PS2 18IN ABS (SUTURE) IMPLANT
SYR BULB 3OZ (MISCELLANEOUS) ×2 IMPLANT
TOWEL OR 17X24 6PK STRL BLUE (TOWEL DISPOSABLE) ×2 IMPLANT
TOWEL OR 17X26 10 PK STRL BLUE (TOWEL DISPOSABLE) ×2 IMPLANT
TUBE CONNECTING 12X1/4 (SUCTIONS) ×2 IMPLANT
WATER STERILE IRR 1000ML POUR (IV SOLUTION) ×2 IMPLANT
YANKAUER SUCT BULB TIP NO VENT (SUCTIONS) IMPLANT
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
## 2011-09-12 NOTE — Anesthesia Preprocedure Evaluation (Addendum)
Anesthesia Evaluation  Patient identified by MRN, date of birth, ID band Patient awake    Reviewed: Allergy & Precautions, H&P , NPO status , Patient's Chart, lab work & pertinent test results  Airway       Dental  (+) Edentulous Upper and Edentulous Lower   Pulmonary shortness of breath, asthma , COPD COPD inhaler,  breath sounds clear to auscultation        Cardiovascular hypertension, Rhythm:Regular Rate:Normal     Neuro/Psych PSYCHIATRIC DISORDERS Anxiety Depression    GI/Hepatic PUD, GERD-  ,  Endo/Other    Renal/GU      Musculoskeletal   Abdominal   Peds  Hematology   Anesthesia Other Findings   Reproductive/Obstetrics                          Anesthesia Physical Anesthesia Plan  ASA: III  Anesthesia Plan: General   Post-op Pain Management:    Induction: Intravenous  Airway Management Planned: Oral ETT  Additional Equipment:   Intra-op Plan:   Post-operative Plan: Extubation in OR  Informed Consent: I have reviewed the patients History and Physical, chart, labs and discussed the procedure including the risks, benefits and alternatives for the proposed anesthesia with the patient or authorized representative who has indicated his/her understanding and acceptance.     Plan Discussed with:   Anesthesia Plan Comments:         Anesthesia Quick Evaluation

## 2011-09-12 NOTE — Anesthesia Procedure Notes (Addendum)
Procedure Name: Intubation Date/Time: 09/12/2011 2:34 PM Performed by: Lovie Chol Pre-anesthesia Checklist: Patient identified, Emergency Drugs available, Patient being monitored, Suction available and Timeout performed Patient Re-evaluated:Patient Re-evaluated prior to inductionOxygen Delivery Method: Circle system utilized Preoxygenation: Pre-oxygenation with 100% oxygen Intubation Type: IV induction Ventilation: Mask ventilation without difficulty and Oral airway inserted - appropriate to patient size Grade View: Grade I Tube type: Oral Tube size: 7.5 mm Number of attempts: 1 Airway Equipment and Method: Stylet Placement Confirmation: ETT inserted through vocal cords under direct vision,  positive ETCO2 and breath sounds checked- equal and bilateral Secured at: 22 cm Tube secured with: Tape Dental Injury: Teeth and Oropharynx as per pre-operative assessment    Anesthesia Regional Block:  Supraclavicular block  Pre-Anesthetic Checklist: ,, timeout performed, Correct Patient, Correct Site, Correct Laterality, Correct Procedure, Correct Position, site marked, Risks and benefits discussed,  Surgical consent,  Pre-op evaluation,  At surgeon's request and post-op pain management  Laterality: Right  Prep: chloraprep       Needles:  Injection technique: Single-shot  Needle Type: Echogenic Stimulator Needle     Needle Length:cm 9 cm Needle Gauge: 22 and 22 G    Additional Needles:  Procedures: ultrasound guided Supraclavicular block Narrative:  Start time: 09/12/2011 4:35 PM End time: 09/12/2011 4:45 PM  Performed by: Personally   Additional Notes: Patient in PACU complaining of severe R. Arm pain. Decision made to perform supraclavicular block in PACU  30 cc 0.5% marcaine with 1:200 Epi injected under US guidance.  Kipp Brood, MD

## 2011-09-12 NOTE — Op Note (Signed)
OPERATIVE REPORT  DATE OF SURGERY: 09/12/2011  PATIENT:  [name] [name],  61 y.o. male  PRE-OPERATIVE DIAGNOSIS:  Displaced Right Distal Radius Fracture  POST-OPERATIVE DIAGNOSIS:  displaced right distal radius fracture  PROCEDURE:  Procedure(s): OPEN REDUCTION INTERNAL FIXATION (ORIF) DISTAL RADIAL FRACTURE Using Biomet DVR plate.  SURGEON:  Surgeon(s): Nadara Mustard, MD  ANESTHESIA:   general  EBL:  Minimal ML  SPECIMEN:  No Specimen  TOURNIQUET:  * No tourniquets in log *  PROCEDURE DETAILS: Patient is a 61 year old gentleman who presents with displaced right distal radius fracture. Additionally underwent closed treatment patient has had progressive displacement has clawing of the fingers long ring and little and has pain and presents at this time for open reduction takedown of the malunion and internal fixation. Risks and benefits of surgery were discussed including infection neurovascular injury persistent pain need for additional surgery as well as the risk of arthritis. Patient states he understands and wishes to proceed at this time. Description of procedure patient brought to the OR room 15 and underwent a general anesthetic. After adequate levels of anesthesia were obtained patient's right upper extremity was prepped using DuraPrep and draped into a sterile field. A extensile approach of Sherilyn Cooter was used over the FCR the FCR sheath was incised the FCR was retracted radially and the dorsal aspect the FCR sheath was incised and the quadratus was retracted ulnarly. The patient had callus which had healed the distal fracture and malalignment. The callus was taken down the radial styloid the styloid was reduced and this was stabilized with a DVR plate and was secured with 3 compression screws proximally and the locking screws distally. C-arm fluoroscopy verified reduction. The wound is irrigated with normal saline the incision was closed using 2-0 nylon the wound is covered with Adaptic  orthopedic sponges Kerlix and Coban. Patient was extubated taken to the PACU in stable condition.  PLAN OF CARE: Admit to inpatient   PATIENT DISPOSITION:  PACU - hemodynamically stable.   Nadara Mustard, MD 09/12/2011 4:00 PM

## 2011-09-12 NOTE — Progress Notes (Signed)
Hard contact lens removed with plunger by Eunice Blase, RN and it was placed in right side (green) of contact lens case and placed in pts red bag.  Pt used chlorhexidine wipes and immediately began itching. Pt was given several warm wash clothes to help removed chlorhexidine from skin. Dr. Noreene Larsson notified of this and requested that pt be given 25 mg of Vistaril to aid with itching. Order placed in Epic. Message sent to pharmacy requesting medication. Will continue to monitor.

## 2011-09-12 NOTE — Progress Notes (Signed)
Orthopedic Tech Progress Note Patient Details:  [name] [name] 1951/02/10 [id number]  Other Ortho Devices Ortho Device Location: thumb spica splint Ortho Device Interventions: Ordered   Shawnie Pons 09/12/2011, 3:02 PM

## 2011-09-12 NOTE — Anesthesia Postprocedure Evaluation (Signed)
  Anesthesia Post-op Note  Patient: [name] [name]  Procedure(s) Performed: Procedure(s) (LRB): OPEN REDUCTION INTERNAL FIXATION (ORIF) DISTAL RADIAL FRACTURE (Right)  Patient Location: PACU  Anesthesia Type: General  Level of Consciousness: awake, alert  and oriented  Airway and Oxygen Therapy: Patient Spontanous Breathing and Patient connected to nasal cannula oxygen  Post-op Pain: none  Post-op Assessment: Post-op Vital signs reviewed and Patient's Cardiovascular Status Stable  Post-op Vital Signs: stable  Complications: No apparent anesthesia complications

## 2011-09-12 NOTE — Transfer of Care (Signed)
Immediate Anesthesia Transfer of Care Note  Patient: [name] [name]  Procedure(s) Performed: Procedure(s) (LRB): OPEN REDUCTION INTERNAL FIXATION (ORIF) DISTAL RADIAL FRACTURE (Right)  Patient Location: PACU  Anesthesia Type: General  Level of Consciousness: awake, alert , oriented and patient cooperative  Airway & Oxygen Therapy: Patient Spontanous Breathing and Patient connected to nasal cannula oxygen  Post-op Assessment: Report given to PACU RN and Post -op Vital signs reviewed and stable  Post vital signs: Reviewed  Complications: No apparent anesthesia complications

## 2011-09-12 NOTE — Preoperative (Signed)
Beta Blockers   Reason not to administer Beta Blockers:Not Applicable 

## 2011-09-13 MED ORDER — HYDROMORPHONE HCL PF 1 MG/ML IJ SOLN
1.0000 mg | INTRAMUSCULAR | Status: DC | PRN
  Administered 2011-09-13 (×2): 1 mg via INTRAVENOUS
  Administered 2011-09-14: 2 mg via INTRAVENOUS
  Filled 2011-09-13: qty 1
  Filled 2011-09-13: qty 2

## 2011-09-13 NOTE — Progress Notes (Signed)
Patient ID: [name] [name], male   DOB: March 17, 1951, 61 y.o.   MRN: [id number] Postoperative day 1 ORIF right distal radius. Plan for discharge to home on Monday. No complaints of pain this morning. Patient given instructions for active and passive range of motion of his digits. Request OT for evaluation today.

## 2011-09-14 NOTE — Progress Notes (Signed)
09/14/11 2100 Nursing:  Patient unable to void- stated he has only gone a very small amount a couple times today and has not been able to empty his bladder completely.  Bladder scan showed 734cc. Patient walked to bathroom by staff, still unable to void. Patient complained of pressure, nausea, and pain from inability to void.  3rd In and Out Cath - order to place Foley. Foley placed - patient felt relief immediately.  Will continue to monitor.  Casey Burkitt, RN

## 2011-09-14 NOTE — Progress Notes (Signed)
Patient started complaining again about pressure building up in his bladder.  We did a bladder scan at 0500.  He had 696 ml in his bladder. Patient tried to urinate in the urinal.  I ran the water and he changed positions.  Still no success. At 0530, we did I/O cath. X 2, 900 ml of urine was released.  Pressure was resolved.  I explained that the next time we have to I/O cath a foley will be placed.  Patient was okay with that.

## 2011-09-14 NOTE — Progress Notes (Signed)
Patient was having trouble voiding on his own.  He was complaining of pain and pressure in his abdomen.  Had patient ambulate with stand by assistance.  He was still unable to void.  We did a bladder scan. Patient had 750 ml in his bladder.  Received an order to I/O cath X 3 then foley.  At 2200, we removed 950 ml via I/O cath X 1., I am monitoring patient in regards to ability to urinate on his own.
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
## 2011-09-14 NOTE — Progress Notes (Signed)
Occupational Therapy Treatment Patient Details Name: [name] [name] MRN: [id number] DOB: 16-Nov-1950 Today's Date: 09/14/2011 Time: 1000-1025 OT Time Calculation (min): 25 min  OT Assessment / Plan / Recommendation Comments on Treatment Session Used heat/ice contrast with elevation to decrease edema. Heat prior to exercise. Pt with apparent stiffness R digits. Pt appears to not use his hand as much as possible and requires max cues to use R hand functionally. Discussed with CNA to encourage use of R hand.Will see again this pm to address mobilization R digits.    Follow Up Recommendations  Home health OT    Equipment Recommendations  Other (comment) (AE)    Frequency Min 3X/week   Plan Discharge plan remains appropriate    Precautions / Restrictions Restrictions Weight Bearing Restrictions: Yes RUE Weight Bearing: Non weight bearing   Pertinent Vitals/Pain     ADL  Eating/Feeding: Set up;Other (comment) (adapted curved spoon with built up foam to encourage R use) Where Assessed - Eating/Feeding: Bed level Grooming: Simulated;Minimal assistance Where Assessed - Grooming: Supine, head of bed up Upper Body Bathing: Simulated;Minimal assistance Where Assessed - Upper Body Bathing: Supine, head of bed up;Supported Lower Body Bathing: Simulated;Set up Where Assessed - Lower Body Bathing: Sitting, bed Upper Body Dressing: Simulated;Minimal assistance Where Assessed - Upper Body Dressing: Sitting, bed Lower Body Dressing: Simulated;Set up Where Assessed - Lower Body Dressing: Sit to stand from bed Toilet Transfer: Simulated;Minimal assistance;Other (comment) (secondary to legally blind) Toilet Transfer Method: Ambulating Toileting - Hygiene: Modified independent Tub/Shower Transfer: Not assessed Ambulation Related to ADLs: HHA due to blindness    OT Goals Acute Rehab OT Goals OT Goal Formulation: With patient Time For Goal Achievement: 09/21/11 Potential to Achieve Goals:  Good ADL Goals Pt Will Perform Eating: with modified independence;Unsupported;with adaptive utensils ADL Goal: Eating - Progress: Progressing toward goals Pt Will Perform Grooming: with modified independence;Sitting, chair;with adaptive equipment ADL Goal: Grooming - Progress: Progressing toward goals Pt Will Perform Upper Body Bathing: with modified independence;Sitting, chair ADL Goal: Upper Body Bathing - Progress: Progressing toward goals Pt Will Perform Upper Body Dressing: with modified independence;Sitting, chair ADL Goal: Upper Body Dressing - Progress: Progressing toward goals Arm Goals Pt Will Perform AROM: Independently;to maintain range of motion;to decrease contractures;Right upper extremity;10 reps;2 sets;Other (comment) Arm Goal: AROM - Progress: Progressing toward goal Additional Arm Goal #1: indep with positioning and edema control R hand. Arm Goal: Additional Goal #1 - Progress: Progressing toward goals  Visit Information  Last OT Received On: 09/14/11    Subjective Data      Prior Functioning  Home Living Lives With: Alone Available Help at Discharge: Other (Comment) (may have help at D/C?) Type of Home: House Home Access: Stairs to enter [employer] of Steps: 2 Home Layout: One level Bathroom Shower/Tub: Engineer, manufacturing systems: Standard Bathroom Accessibility: Yes How Accessible: Accessible via walker Home Adaptive Equipment: Shower chair with back;Other (comment) (photo machine for skin condition. walking  stick) Prior Function Level of Independence: Independent Able to Take Stairs?: Yes Driving: No Vocation: Unemployed Communication Communication: No difficulties Dominant Hand: Right    Cognition  Overall Cognitive Status: Appears within functional limits for tasks assessed/performed Arousal/Alertness: Awake/alert Orientation Level: Appears intact for tasks assessed Behavior During Session: [facility] for tasks performed    Mobility  Bed Mobility Bed Mobility: Supine to Sit Supine to Sit: 6: Modified independent (Device/Increase time) Transfers Transfers: Sit to Stand;Stand to Sit Sit to Stand: 5: Supervision   Exercises Hand Exercises Digit Composite  Flexion: PROM;AROM;Right;10 reps;Supine;Squeeze ball Composite Extension: PROM;AROM;Right;10 reps;Supine Digit Composite Abduction: PROM;AROM;Right;10 reps;Supine Digit Composite Adduction: PROM;AROM;Right;10 reps;Supine Digit Lifts: AROM;PROM;Right;10 reps;Supine Thumb Abduction: AROM;PROM;Right;10 reps;Supine Thumb Adduction: PROM;AROM;Right;10 reps;Supine Opposition: PROM;Right;10 reps;Supine  Balance    End of Session OT - End of Session Activity Tolerance: Patient limited by pain Patient left: in bed;with call bell/phone within reach Nurse Communication: Weight bearing status;Other (comment)   Aralyn Nowak,HILLARY 09/14/2011, 12:57 PM [facility], OTR/L  [phone] 09/14/2011

## 2011-09-15 MED ORDER — OXYCODONE-ACETAMINOPHEN 5-325 MG PO TABS: 1.0000 | ORAL_TABLET | ORAL | Status: DC | PRN

## 2011-09-15 NOTE — Progress Notes (Signed)
Occupational Therapy Treatment Patient Details Name: [name] [name] MRN: [id number] DOB: 04-Sep-1950 Today's Date: 09/15/2011 Time: 4098-1191 OT Time Calculation (min): 26 min  OT Assessment / Plan / Recommendation Comments on Treatment Session ROM of digits limited by pain, will benfit from HHOT to continue to address ROM of RUE    Follow Up Recommendations  Home health OT    Equipment Recommendations       Frequency Min 3X/week   Plan Discharge plan remains appropriate    Precautions / Restrictions Precautions Precautions: Fall Restrictions Weight Bearing Restrictions: Yes RUE Weight Bearing: Non weight bearing   Pertinent Vitals/Pain 6/10 RUE, nursing made aware and brought pain meds to patient    ADL  Eating/Feeding: Performed;Set up;Supervision/safety (Pt says he prefers to use LUE not right with AE) Where Assessed - Eating/Feeding: Bed level    OT Goals ADL Goals ADL Goal: Eating - Progress: Not progressing (Pt not interested in trying to use AE with RUE) Arm Goals Arm Goal: AROM - Progress: Progressing toward goal Arm Goal: Additional Goal #1 - Progress: Progressing toward goals  Visit Information  Last OT Received On: 09/15/11 Assistance Needed: +1    Subjective Data  Subjective: This wrap feels too tight on my arm   Prior Functioning       Cognition  Overall Cognitive Status: Appears within functional limits for tasks assessed/performed Arousal/Alertness: Awake/alert Orientation Level: Appears intact for tasks assessed Behavior During Session: [facility] for tasks performed    Mobility     Exercises Other Exercises Other Exercises: Re-wrapped his the co-ban part of his RUE due to pt c/o it being too tight. Afterwards he reported it felt alot better. Pt definitely with limited AROM/PROM of right digits 1, 2-5 for both flexion and extension, able to get 2/3 of range for digit extension and left him with heat on both sides of his hand to return later to see if  can get  more P/AROM of digits.  Other Exercises: Pt needed A to reposition RUE on pillows to get most benefit from elevation.  Balance    End of Session OT - End of Session Activity Tolerance: Patient limited by pain Patient left: in bed;with call bell/phone within reach   Evette Georges [phone] 09/15/2011, 2:36 PM

## 2011-09-15 NOTE — Progress Notes (Signed)
Occupational Therapy Treatment Patient Details Name: SEVERIN BOU MRN: [id number] DOB: [birth year] Today's Date: 09/15/2011 Time: 4132-4401 OT Time Calculation (min): 22 min  OT Assessment / Plan / Recommendation Comments on Treatment Session Pt making progress, but definitely needs HHOT.    Follow Up Recommendations  Home health OT    Equipment Recommendations       Frequency Min 3X/week   Plan Discharge plan remains appropriate    Precautions / Restrictions Precautions Precautions: Fall Restrictions Weight Bearing Restrictions: Yes RUE Weight Bearing: Weight bear through elbow only        ADL  Eating/Feeding: Performed;Set up;Supervision/safety (Pt says he prefers to use LUE not right with AE) Where Assessed - Eating/Feeding: Bed level Upper Body Dressing: Performed;Set up Where Assessed - Upper Body Dressing: Sitting, bed;Unsupported Lower Body Dressing: Performed;Set up Where Assessed - Lower Body Dressing: Unsupported;Sit to stand from bed Toilet Transfer: Simulated;Supervision/safety (due to decreased awareness of this environment and blindness) Toilet Transfer Method: Ambulating (Bed to chair on his right) Ambulation Related to ADLs: Supervison with directional cues due to blindness    OT Goals ADL Goals ADL Goal: Eating - Progress: Not progressing (Pt not interested in trying to use AE with RUE) ADL Goal: Upper Body Dressing - Progress: Progressing toward goals (Needs set up) Arm Goals Arm Goal: AROM - Progress: Progressing toward goal (needs supervision) Arm Goal: Additional Goal #1 - Progress: Progressing toward goals (needs min A to help with pillows for best position)  Visit Information  Last OT Received On: 09/15/11 Assistance Needed: +1    Subjective Data  Subjective: This wrap feels too tight on my arm   Prior Functioning       Cognition  Overall Cognitive Status: Appears within functional limits for tasks  assessed/performed Arousal/Alertness: Awake/alert Orientation Level: Appears intact for tasks assessed Behavior During Session: [facility] for tasks performed Cognition - Other Comments: Pt asked what time it was and I told him 5 until 3:00. He asked if lunch had been served yet and I told him yes that did he not remember me coming into room earlier and setting up his tray for him with the chicken sandwich, chocolate pudding, and tea. He said not really.    Mobility Bed Mobility Bed Mobility: Supine to Sit;Sitting - Scoot to Edge of Bed Supine to Sit: 7: Independent Sitting - Scoot to Edge of Bed: 7: Independent Transfers Transfers: Sit to Stand;Stand to Sit Sit to Stand: 7: Independent;With upper extremity assist;From bed Stand to Sit: 7: Independent;With upper extremity assist;With armrests;To chair/3-in-1   Exercises Other Exercises Other Exercises: Pt able to perform 10 reps of digit SROM composite flexion/extension as well as 10 reps of elbow flexion and extension. Pt reported that fingers felt less tight since the heat had been on them. Encouarged him to do these exercises throughtout the day when he is at home. Also encouarged him to keep RUE propped up so that elbow is lower than  hand for edema control.  Pt reports that  Dr. Lajoyce Corners told him  that he did not need to wear the soft arm splint in the room.  Pt aware that he is not suppose to push, pull, or lift with RUE. Other Exercises: Pt needed A to reposition RUE on pillows to get most benefit from elevation.  Balance    End of Session OT - End of Session Activity Tolerance: Patient tolerated treatment well Patient left: in chair;with call bell/phone within reach   Evette Georges [phone]  09/15/2011, 3:14 PM

## 2011-09-15 NOTE — Discharge Instructions (Signed)
Nonweightbearing right upper extremity. Patient is to work daily on range of motion of his fingers right hand. Continue elevation right hand.

## 2011-09-15 NOTE — Discharge Summary (Signed)
Physician Discharge Summary  Patient ID: [name] [name] MRN: [id number] DOB/AGE: 1950/09/09 61 y.o.  Admit date: 09/12/2011 Discharge date: 09/15/2011  Admission Diagnoses: Malunion right distal radius fracture  Discharge Diagnoses: Same Active Problems:  * No active hospital problems. *    Discharged Condition: stable  Hospital Course: Patient's hospital course was essentially unremarkable he underwent take down of the right distal radius malunion open reduction internal fixation. Postoperatively patient did require increased pain medication and required a Foley catheter placement. Patient states he has had urologic problems in the past and will followup with his urologist.  Consults: None  Significant Diagnostic Studies: labs: Routine labs  Treatments: surgery: Please see operative note  Discharge Exam: Blood pressure 156/89, pulse 74, temperature 98 F (36.7 C), temperature source Oral, resp. rate 20, height 5\' 11"  (1.803 m), weight 74.844 kg (165 lb), SpO2 94.00%. Incision/Wound: incision clean and dry  Disposition: 01-Home or Self Care  Discharge Orders    Future Appointments: Provider: Department: Dept Phone: Center:   10/09/2011 11:00 AM Aris Lot, MD [facility] [phone] [facility]     Future Orders Please Complete By Expires   Diet - low sodium heart healthy      Call MD / Call 911      Comments:   If you experience chest pain or shortness of breath, CALL 911 and be transported to the hospital emergency room.  If you develope a fever above 101 F, pus (white drainage) or increased drainage or redness at the wound, or calf pain, call your surgeon's office.   Constipation Prevention      Comments:   Drink plenty of fluids.  Prune juice may be helpful.  You may use a stool softener, such as Colace (over the counter) 100 mg twice a day.  Use MiraLax (over the counter) for constipation as needed.   Increase activity slowly as tolerated      Weight Bearing as taught  in Physical Therapy      Comments:   Use a walker or crutches as instructed.     Medication List  As of 09/15/2011  6:31 AM   TAKE these medications         ADVAIR DISKUS 500-50 MCG/DOSE Aepb   Generic drug: Fluticasone-Salmeterol   Inhale 1 puff into the lungs every 12 (twelve) hours.      albuterol 108 (90 BASE) MCG/ACT inhaler   Commonly known as: PROVENTIL HFA;VENTOLIN HFA   Inhale 2 puffs into the lungs every 4 (four) hours as needed. For shortness of breath      albuterol (2.5 MG/3ML) 0.083% nebulizer solution   Commonly known as: PROVENTIL   Take 2.5 mg by nebulization every 4 (four) hours as needed. For shortness of breath      ARIPiprazole 2 MG tablet   Commonly known as: ABILIFY   Take 2 mg by mouth daily.      Calcium 600+D 600-400 MG-UNIT per tablet   Generic drug: Calcium Carbonate-Vitamin D   Take 1 tablet by mouth daily.      desoximetasone 0.05 % cream   Commonly known as: TOPICORT   Apply 1 application topically 3 (three) times daily as needed. For itching      diazepam 10 MG tablet   Commonly known as: VALIUM   Take by mouth 3 (three) times daily.      hydrOXYzine 25 MG tablet   Commonly known as: ATARAX/VISTARIL   Take 25 mg by mouth 3 (three) times  daily as needed. For itching      montelukast 10 MG tablet   Commonly known as: SINGULAIR   Take 10 mg by mouth at bedtime.      NASONEX 50 MCG/ACT nasal spray   Generic drug: mometasone   Place 2 sprays into the nose daily.      omeprazole 20 MG capsule   Commonly known as: PRILOSEC   Take 20 mg by mouth daily as needed. Acid reflux      oxyCODONE-acetaminophen 5-325 MG per tablet   Commonly known as: PERCOCET   Take 1 tablet by mouth every 4 (four) hours as needed. For pain      oxyCODONE-acetaminophen 5-325 MG per tablet   Commonly known as: PERCOCET   Take 1 tablet by mouth every 4 (four) hours as needed for pain.      prednisoLONE acetate 1 % ophthalmic suspension   Commonly known as:  PRED FORTE   Place 1 drop into both eyes 3 (three) times daily.      predniSONE 20 MG tablet   Commonly known as: DELTASONE   Take 20 mg by mouth daily.      sertraline 100 MG tablet   Commonly known as: ZOLOFT   Take 200 mg by mouth daily.      temazepam 15 MG capsule   Commonly known as: RESTORIL   Take 15 mg by mouth 3 (three) times daily. For insomnia      traZODone 100 MG tablet   Commonly known as: DESYREL   Take 100 mg by mouth at bedtime as needed. For insomnia           Follow-up Information    Follow up with Shyheem Whitham V, MD in 1 week.   Contact information:   [address] [place] [address] [phone]          Signed: Nadara Mustard 09/15/2011, 6:31 AM

## 2011-09-15 NOTE — Progress Notes (Signed)
CARE MANAGEMENT NOTE 09/15/2011  Patient:  [name] [name], [name] [name]   Account Number:  [id number]  Date Initiated:  09/15/2011  Documentation initiated by:  Vance Peper  Subjective/Objective Assessment:   61 yr old male s/p repair of right radial wrist fracture     Action/Plan:   Spoke with patient regarding HH needs. He is legally blind. States he had aid service thru [facility]. Has been active with [facility]. Patient has adaptive aids at his home, CM contacted [employer] to get phone aid device.   Anticipated DC Date:  09/15/2011   Anticipated DC Plan:  HOME W HOME HEALTH SERVICES      DC Planning Services  CM consult      [facility] Choice  HOME HEALTH   Choice offered to / List presented to:  C-1 Patient        HH arranged  HH-1 RN  HH-2 PT  HH-4 NURSE'S AIDE      HH agency  [facility]   Status of service:  Completed, signed off Discharge Disposition:  HOME W HOME HEALTH SERVICES

## 2011-09-16 ENCOUNTER — Inpatient Hospital Stay (HOSPITAL_COMMUNITY)
Admission: EM | Admit: 2011-09-16 | Discharge: 2011-09-19 | DRG: 690 | Disposition: A | Discharge status: Home / Self Care | Payer: Medicaid Other | Attending: Internal Medicine | Admitting: Internal Medicine

## 2011-09-16 ENCOUNTER — Emergency Department (HOSPITAL_COMMUNITY): Payer: Medicaid Other

## 2011-09-16 ENCOUNTER — Encounter (HOSPITAL_COMMUNITY): Payer: Self-pay | Admitting: Emergency Medicine

## 2011-09-16 ENCOUNTER — Ambulatory Visit: Payer: Medicaid Other | Admitting: Adult Health

## 2011-09-16 DIAGNOSIS — H543 Unqualified visual loss, both eyes: Secondary | ICD-10-CM | POA: Diagnosis present

## 2011-09-16 DIAGNOSIS — H548 Legal blindness, as defined in USA: Secondary | ICD-10-CM

## 2011-09-16 DIAGNOSIS — J4489 Other specified chronic obstructive pulmonary disease: Secondary | ICD-10-CM | POA: Diagnosis present

## 2011-09-16 DIAGNOSIS — R339 Retention of urine, unspecified: Secondary | ICD-10-CM

## 2011-09-16 DIAGNOSIS — N390 Urinary tract infection, site not specified: Principal | ICD-10-CM

## 2011-09-16 DIAGNOSIS — J45909 Unspecified asthma, uncomplicated: Secondary | ICD-10-CM

## 2011-09-16 DIAGNOSIS — Z8719 Personal history of other diseases of the digestive system: Secondary | ICD-10-CM

## 2011-09-16 DIAGNOSIS — M949 Disorder of cartilage, unspecified: Secondary | ICD-10-CM | POA: Diagnosis present

## 2011-09-16 DIAGNOSIS — R112 Nausea with vomiting, unspecified: Secondary | ICD-10-CM

## 2011-09-16 DIAGNOSIS — I129 Hypertensive chronic kidney disease with stage 1 through stage 4 chronic kidney disease, or unspecified chronic kidney disease: Secondary | ICD-10-CM | POA: Diagnosis present

## 2011-09-16 DIAGNOSIS — J449 Chronic obstructive pulmonary disease, unspecified: Secondary | ICD-10-CM | POA: Diagnosis present

## 2011-09-16 DIAGNOSIS — R000 Tachycardia, unspecified: Secondary | ICD-10-CM

## 2011-09-16 DIAGNOSIS — S0292XA Unspecified fracture of facial bones, initial encounter for closed fracture: Secondary | ICD-10-CM

## 2011-09-16 DIAGNOSIS — S0083XA Contusion of other part of head, initial encounter: Secondary | ICD-10-CM

## 2011-09-16 DIAGNOSIS — S06309A Unspecified focal traumatic brain injury with loss of consciousness of unspecified duration, initial encounter: Secondary | ICD-10-CM

## 2011-09-16 DIAGNOSIS — M899 Disorder of bone, unspecified: Secondary | ICD-10-CM | POA: Diagnosis present

## 2011-09-16 DIAGNOSIS — K219 Gastro-esophageal reflux disease without esophagitis: Secondary | ICD-10-CM

## 2011-09-16 DIAGNOSIS — IMO0001 Reserved for inherently not codable concepts without codable children: Secondary | ICD-10-CM

## 2011-09-16 DIAGNOSIS — J309 Allergic rhinitis, unspecified: Secondary | ICD-10-CM

## 2011-09-16 DIAGNOSIS — N189 Chronic kidney disease, unspecified: Secondary | ICD-10-CM | POA: Diagnosis present

## 2011-09-16 DIAGNOSIS — S52501A Unspecified fracture of the lower end of right radius, initial encounter for closed fracture: Secondary | ICD-10-CM | POA: Diagnosis present

## 2011-09-16 DIAGNOSIS — L259 Unspecified contact dermatitis, unspecified cause: Secondary | ICD-10-CM

## 2011-09-16 DIAGNOSIS — J329 Chronic sinusitis, unspecified: Secondary | ICD-10-CM

## 2011-09-16 HISTORY — DX: Low back pain, unspecified: M54.50

## 2011-09-16 HISTORY — DX: Low back pain: M54.5

## 2011-09-16 HISTORY — DX: Other chronic pain: G89.29

## 2011-09-16 HISTORY — DX: Chronic viral hepatitis C: B18.2

## 2011-09-16 LAB — URINE MICROSCOPIC-ADD ON

## 2011-09-16 LAB — URINALYSIS, ROUTINE W REFLEX MICROSCOPIC
Bilirubin Urine: NEGATIVE
Hgb urine dipstick: NEGATIVE
Specific Gravity, Urine: 1.02 (ref 1.005–1.030)
Urobilinogen, UA: 0.2 mg/dL (ref 0.0–1.0)
pH: 6.5 (ref 5.0–8.0)

## 2011-09-16 LAB — POCT I-STAT, CHEM 8
Calcium, Ion: 1.12 mmol/L (ref 1.12–1.32)
HCT: 38 % — ABNORMAL LOW (ref 39.0–52.0)
Hemoglobin: 12.9 g/dL — ABNORMAL LOW (ref 13.0–17.0)
TCO2: 25 mmol/L (ref 0–100)

## 2011-09-16 LAB — CBC
HCT: 37.3 % — ABNORMAL LOW (ref 39.0–52.0)
MCH: 31.8 pg (ref 26.0–34.0)
MCV: 89.9 fL (ref 78.0–100.0)
Platelets: 315 10*3/uL (ref 150–400)
RBC: 4.22 MIL/uL (ref 4.22–5.81)
RDW: 13.2 % (ref 11.5–15.5)
RDW: 13.1 % (ref 11.5–15.5)
WBC: 10.8 10*3/uL — ABNORMAL HIGH (ref 4.0–10.5)
WBC: 8.8 10*3/uL (ref 4.0–10.5)

## 2011-09-16 LAB — CREATININE, SERUM
Creatinine, Ser: 0.85 mg/dL (ref 0.50–1.35)
GFR calc Af Amer: >90 mL/min (ref >90)
GFR calc non Af Amer: >90 mL/min (ref >90)

## 2011-09-16 MED ORDER — ONDANSETRON HCL 4 MG/2ML IJ SOLN
INTRAMUSCULAR | Status: AC
  Administered 2011-09-16: 4 mg via INTRAVENOUS
  Filled 2011-09-16: qty 2

## 2011-09-16 MED ORDER — FLUOCINONIDE 0.05 % EX CREA
TOPICAL_CREAM | Freq: Three times a day (TID) | CUTANEOUS | Status: DC | PRN
  Filled 2011-09-16: qty 30

## 2011-09-16 MED ORDER — HYDROMORPHONE HCL PF 1 MG/ML IJ SOLN
1.0000 mg | INTRAMUSCULAR | Status: DC | PRN
  Administered 2011-09-17 – 2011-09-19 (×12): 1 mg via INTRAVENOUS
  Filled 2011-09-16 (×12): qty 1

## 2011-09-16 MED ORDER — DEXTROSE 5 % IV SOLN
1.0000 g | INTRAVENOUS | Status: DC
  Administered 2011-09-17 – 2011-09-18 (×2): 1 g via INTRAVENOUS
  Filled 2011-09-16 (×3): qty 10

## 2011-09-16 MED ORDER — DOCUSATE SODIUM 100 MG PO CAPS
100.0000 mg | ORAL_CAPSULE | Freq: Two times a day (BID) | ORAL | Status: DC
  Administered 2011-09-16 – 2011-09-18 (×5): 100 mg via ORAL
  Filled 2011-09-16 (×4): qty 1

## 2011-09-16 MED ORDER — FLUTICASONE PROPIONATE 50 MCG/ACT NA SUSP
1.0000 | Freq: Every day | NASAL | Status: DC
  Administered 2011-09-16 – 2011-09-19 (×4): 1 via NASAL
  Filled 2011-09-16: qty 16

## 2011-09-16 MED ORDER — ONDANSETRON HCL 4 MG PO TABS: 4.0000 mg | ORAL_TABLET | Freq: Four times a day (QID) | ORAL | Status: DC | PRN

## 2011-09-16 MED ORDER — PREDNISOLONE ACETATE 1 % OP SUSP
1.0000 [drp] | Freq: Three times a day (TID) | OPHTHALMIC | Status: DC
  Administered 2011-09-16 – 2011-09-19 (×9): 1 [drp] via OPHTHALMIC
  Filled 2011-09-16: qty 1

## 2011-09-16 MED ORDER — ENOXAPARIN SODIUM 40 MG/0.4ML ~~LOC~~ SOLN
40.0000 mg | SUBCUTANEOUS | Status: DC
  Administered 2011-09-16 – 2011-09-18 (×3): 40 mg via SUBCUTANEOUS
  Filled 2011-09-16 (×4): qty 0.4

## 2011-09-16 MED ORDER — PREDNISONE 20 MG PO TABS
20.0000 mg | ORAL_TABLET | Freq: Every day | ORAL | Status: DC
  Administered 2011-09-16 – 2011-09-18 (×3): 20 mg via ORAL
  Filled 2011-09-16 (×4): qty 1

## 2011-09-16 MED ORDER — SODIUM CHLORIDE 0.9 % IV SOLN: INTRAVENOUS | Status: DC

## 2011-09-16 MED ORDER — HYDROXYZINE HCL 25 MG PO TABS: 25.0000 mg | ORAL_TABLET | Freq: Three times a day (TID) | ORAL | Status: DC | PRN

## 2011-09-16 MED ORDER — ARIPIPRAZOLE 2 MG PO TABS
2.0000 mg | ORAL_TABLET | Freq: Every day | ORAL | Status: DC
  Administered 2011-09-16 – 2011-09-19 (×4): 2 mg via ORAL
  Filled 2011-09-16 (×4): qty 1

## 2011-09-16 MED ORDER — DESOXIMETASONE 0.05 % EX CREA: 1.0000 "application " | TOPICAL_CREAM | Freq: Three times a day (TID) | CUTANEOUS | Status: DC | PRN

## 2011-09-16 MED ORDER — ONDANSETRON HCL 4 MG/2ML IJ SOLN: 4.0000 mg | Freq: Four times a day (QID) | INTRAMUSCULAR | Status: DC | PRN

## 2011-09-16 MED ORDER — HYDRALAZINE HCL 20 MG/ML IJ SOLN
10.0000 mg | Freq: Three times a day (TID) | INTRAMUSCULAR | Status: DC | PRN
  Administered 2011-09-16 – 2011-09-19 (×3): 10 mg via INTRAVENOUS
  Filled 2011-09-16 (×4): qty 0.5

## 2011-09-16 MED ORDER — LORAZEPAM 2 MG/ML IJ SOLN
1.0000 mg | Freq: Once | INTRAMUSCULAR | Status: AC
  Administered 2011-09-16: 1 mg via INTRAVENOUS
  Filled 2011-09-16: qty 1

## 2011-09-16 MED ORDER — SODIUM CHLORIDE 0.9 % IV SOLN
INTRAVENOUS | Status: DC
  Administered 2011-09-16: 14:00:00 via INTRAVENOUS

## 2011-09-16 MED ORDER — CALCIUM CARBONATE-VITAMIN D 600-400 MG-UNIT PO TABS: 1.0000 | ORAL_TABLET | Freq: Every day | ORAL | Status: DC

## 2011-09-16 MED ORDER — DIAZEPAM 2 MG PO TABS: 2.0000 mg | ORAL_TABLET | Freq: Three times a day (TID) | ORAL | Status: DC | PRN

## 2011-09-16 MED ORDER — DEXTROSE 5 % IV SOLN
1.0000 g | Freq: Once | INTRAVENOUS | Status: AC
  Administered 2011-09-16: 1 g via INTRAVENOUS
  Filled 2011-09-16: qty 10

## 2011-09-16 MED ORDER — ONDANSETRON 4 MG PO TBDP
4.0000 mg | ORAL_TABLET | Freq: Once | ORAL | Status: AC
Start: 2011-09-16 — End: 2011-09-16
  Administered 2011-09-16: 4 mg via ORAL

## 2011-09-16 MED ORDER — FLUTICASONE-SALMETEROL 500-50 MCG/DOSE IN AEPB
1.0000 | INHALATION_SPRAY | Freq: Two times a day (BID) | RESPIRATORY_TRACT | Status: DC
  Administered 2011-09-16 – 2011-09-19 (×6): 1 via RESPIRATORY_TRACT
  Filled 2011-09-16: qty 14

## 2011-09-16 MED ORDER — DEXTROSE 5 % IV SOLN
1.0000 g | INTRAVENOUS | Status: DC
  Filled 2011-09-16: qty 10

## 2011-09-16 MED ORDER — LOSARTAN POTASSIUM 50 MG PO TABS
50.0000 mg | ORAL_TABLET | Freq: Once | ORAL | Status: AC
  Administered 2011-09-16: 50 mg via ORAL
  Filled 2011-09-16: qty 1

## 2011-09-16 MED ORDER — SERTRALINE HCL 100 MG PO TABS
200.0000 mg | ORAL_TABLET | Freq: Every day | ORAL | Status: DC
  Administered 2011-09-16 – 2011-09-19 (×4): 200 mg via ORAL
  Filled 2011-09-16 (×4): qty 2

## 2011-09-16 MED ORDER — TRAZODONE HCL 100 MG PO TABS
100.0000 mg | ORAL_TABLET | Freq: Every evening | ORAL | Status: DC | PRN
  Filled 2011-09-16: qty 1

## 2011-09-16 MED ORDER — TAMSULOSIN HCL 0.4 MG PO CAPS
0.4000 mg | ORAL_CAPSULE | Freq: Every day | ORAL | Status: DC
  Administered 2011-09-16 – 2011-09-19 (×4): 0.4 mg via ORAL
  Filled 2011-09-16 (×4): qty 1

## 2011-09-16 MED ORDER — PROMETHAZINE HCL 25 MG/ML IJ SOLN: 25.0000 mg | Freq: Once | INTRAMUSCULAR | Status: DC

## 2011-09-16 MED ORDER — FLUTICASONE-SALMETEROL 500-50 MCG/DOSE IN AEPB
1.0000 | INHALATION_SPRAY | Freq: Two times a day (BID) | RESPIRATORY_TRACT | Status: DC
  Filled 2011-09-16: qty 14

## 2011-09-16 MED ORDER — TEMAZEPAM 15 MG PO CAPS
15.0000 mg | ORAL_CAPSULE | Freq: Three times a day (TID) | ORAL | Status: DC
  Administered 2011-09-17 (×2): 15 mg via ORAL
  Filled 2011-09-16 (×4): qty 1

## 2011-09-16 MED ORDER — PANTOPRAZOLE SODIUM 40 MG PO TBEC
40.0000 mg | DELAYED_RELEASE_TABLET | Freq: Every day | ORAL | Status: DC
  Administered 2011-09-16 – 2011-09-19 (×4): 40 mg via ORAL
  Filled 2011-09-16 (×3): qty 1

## 2011-09-16 MED ORDER — MONTELUKAST SODIUM 10 MG PO TABS
10.0000 mg | ORAL_TABLET | Freq: Every day | ORAL | Status: DC
  Administered 2011-09-16 – 2011-09-18 (×3): 10 mg via ORAL
  Filled 2011-09-16 (×4): qty 1

## 2011-09-16 MED ORDER — METOCLOPRAMIDE HCL 5 MG/ML IJ SOLN
10.0000 mg | Freq: Once | INTRAMUSCULAR | Status: AC
  Administered 2011-09-16: 10 mg via INTRAVENOUS
  Filled 2011-09-16: qty 2

## 2011-09-16 MED ORDER — ONDANSETRON 4 MG PO TBDP
ORAL_TABLET | ORAL | Status: AC
  Filled 2011-09-16: qty 1

## 2011-09-16 MED ORDER — ALBUTEROL SULFATE HFA 108 (90 BASE) MCG/ACT IN AERS
2.0000 | INHALATION_SPRAY | RESPIRATORY_TRACT | Status: DC | PRN
  Filled 2011-09-16: qty 6.7

## 2011-09-16 MED ORDER — MORPHINE SULFATE 4 MG/ML IJ SOLN
4.0000 mg | Freq: Once | INTRAMUSCULAR | Status: AC
  Administered 2011-09-16: 4 mg via INTRAVENOUS
  Filled 2011-09-16: qty 1

## 2011-09-16 MED ORDER — TAMSULOSIN HCL 0.4 MG PO CAPS
0.4000 mg | ORAL_CAPSULE | Freq: Once | ORAL | Status: AC
  Administered 2011-09-16: 0.4 mg via ORAL
  Filled 2011-09-16 (×2): qty 1

## 2011-09-16 MED ORDER — OXYCODONE-ACETAMINOPHEN 5-325 MG PO TABS
1.0000 | ORAL_TABLET | ORAL | Status: DC | PRN
  Administered 2011-09-17: 1 via ORAL
  Filled 2011-09-16 (×2): qty 1

## 2011-09-16 MED ORDER — CALCIUM CARBONATE-VITAMIN D 500-200 MG-UNIT PO TABS
1.0000 | ORAL_TABLET | Freq: Every day | ORAL | Status: DC
  Administered 2011-09-16 – 2011-09-19 (×4): 1 via ORAL
  Filled 2011-09-16 (×4): qty 1

## 2011-09-16 MED ORDER — ONDANSETRON HCL 4 MG/2ML IJ SOLN
4.0000 mg | Freq: Once | INTRAMUSCULAR | Status: AC
  Administered 2011-09-16: 4 mg via INTRAVENOUS

## 2011-09-16 MED ORDER — PNEUMOCOCCAL VAC POLYVALENT 25 MCG/0.5ML IJ INJ
0.5000 mL | INJECTION | INTRAMUSCULAR | Status: AC
  Administered 2011-09-17: 0.5 mL via INTRAMUSCULAR
  Filled 2011-09-16: qty 0.5

## 2011-09-16 MED ORDER — TAMSULOSIN HCL 0.4 MG PO CAPS
0.4000 mg | ORAL_CAPSULE | Freq: Every day | ORAL | Status: DC
  Filled 2011-09-16: qty 1

## 2011-09-16 NOTE — H&P (Signed)
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 left orbit on the facial bone  series.  Small parenchymal hyperdensity in the right frontal lobe white matter on image 14.  Tiny amount of parenchymal hemorrhage is not excluded.  Small focus of hyperdensity in the right frontal lobe on image 9 is present consistent with minimal hemorrhagic contusion.  Tiny focal hyperdensity at the falx on image 21 is present.  Tiny amount of extra-axial hemorrhage cannot be excluded.  Subtle fracture through the right side of the sphenoid sinus is suspected and is better delineated on the facial bone series.  No midline shift.  No hydrocephalus.  Chronic ischemic changes and atrophy are superimposed.  No midline shift.  IMPRESSION: Depressed right follow-up skull fracture. Fracture through the sphenoid sinus is suspected.  Two foci of parenchymal hemorrhage in the right frontal lobe consistent with hemorrhagic contusion.  Tiny amount of extra-axial hemorrhage at the anterior falx is not excluded.  CT MAXILLOFACIAL  Findings:  Extensive bilateral facial fractures are noted. Zygomatic arches are intact.  Mandible is intact.  Fractures through the anterior and posterior walls of the right  maxillary sinus as well as the anterior Polster walls of the left maxillary sinus.  Bilateral nasal bone fractures.  There are is a displaced fracture involving the lateral wall of the left orbit which encroaches upon the lateral rectus muscle.  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  There is a trace amount of intraorbital emphysema on the right and a subtle right medial wall orbit fracture is suspected.  Left medial orbital wall fracture with minimal displacement.  There is no evidence of encroachment upon the optic nerves.  There are fractures of the left pterygoid processes.  There is probably a subtle fracture of the right upper pterygoid process.  The fracture extends through the upper aspect of the right superior alveolar ridge, best seen on coronal reconstructions. Fracture line extends through the heart palate to the right of midline.  Trace pneumocephalus is seen above the left orbit.  There is fluid filling both maxillary sinuses, right frontal sinus, and to a lesser degree, the left frontal sinus.  Scattered fluid in the ethmoid air cells.  Air-fluid level in the left sphenoid sinus. A fracture line can be seen extending through the right side of the sphenoid sinus.  Mastoid air cells are intact.  Temporal bone is intact.  IMPRESSION: Complex midface LeFort fracture extending through the maxilla, both medial orbital walls, and the left lateral orbital rim.  Zygomatic arches are intact.  Pneumocephalus above the left orbit.  CT CERVICAL SPINE  Findings:   There is 4 mm anterolisthesis C2 upon C3 but no obvious fracture or dislocation.  Facet arthropathy is associated.  There is severe narrowing of the C3-4, C4-5, C5-6, and C6-7 disc spaces. Bilateral foraminal narrowing secondary to uncovertebral osteophytes occurs at these levels.  No prevertebral soft tissue swelling or spinal hematoma.  Carotid vascular calcifications.  IMPRESSION: Severe degenerative changes.  No evidence of acute bony injury.  There is anterolisthesis C2 upon C3 without definitive cause.  It is likely degenerative however flexion and extension views are recommended when the patient is capable.  Original Report Authenticated By: Donavan Burnet, M.D.   1-Urinary retention/UTI: Patient had foley place, abdomanal pressure has resolved. He still have some nausea, relates vomiting. I will treat for UTI with ceftriaxone. Will send urine culture. I will start Flomax. Will need to remove foley in 24 - 48 hour and give him trial voiding. Will need to make sure patient is able to urinate prior to discharge.  No evidence of SBO.   2-Nausea/vomiting: probably related to UTI. No clinical presentation for gastritis or pancreatitis. Start clear diet. Zofran for nausea as needed.   3-OPEN REDUCTION INTERNAL FIXATION (ORIF) DISTAL RADIAL FRACTURE: I left message to Dr Lajoyce Corners, informing patient is in the hospital.   Riverlyn Kizziah M.D. [facility] [phone] 09/16/2011, 12:10 PM

## 2011-09-16 NOTE — ED Provider Notes (Signed)
History     CSN: [id number]  Arrival date & time 09/16/11  0962   First MD Initiated Contact with Patient 09/16/11 [phone]      Chief Complaint  Patient presents with  . Urinary Retention  . Abdominal Pain    (Consider location/radiation/quality/duration/timing/severity/associated sxs/prior treatment) HPI History provided by pt and prior chart.  Per prior chart, pt admitted for ORIF right distal radius 4/26-4/29/13 (yesterday morning).  Pt reports that he developed urinary retention immediately following surgery.  Per prior inpatient nursing note, pt cathed multiple times prior to discharge and foley was placed.  Pt states that he was not sent home w/ foley.  Was unable to urinate when he returned home.  Associated w/ severe, diffuse lower abd pain and N/V.  Has experienced urinary retention and similar pain in the past with kidney stones.  Denies fever, flank pain, and change in bowels.  Nursing staff has placed a foley and pt reports that his pain is much improved, though he continues to feel nauseous.    Past Medical History  Diagnosis Date  . Legal blindness, as defined in [place]   . Cough   . Esophageal reflux   . Osteopenia   . Personal history of other diseases of digestive system   . Contact dermatitis and other eczema, due to unspecified cause   . Unspecified sinusitis (chronic)   . Allergic rhinitis   . Unspecified asthma   . Hepatitis C infection   . COPD (chronic obstructive pulmonary disease)   . Hypertension   . Seasonal allergies   . Ulcerative colitis   . Shortness of breath   . Pneumonia     in past had several times  . Chronic kidney disease     kidney stones  . H/O hiatal hernia   . Arthritis     spine  . Anxiety   . Depression   . Blind     Left totally blind  . Multiple open wounds     "from esczema- scratching"  . Multiple facial bone fractures     Past Surgical History  Procedure Date  . Tonsillectomy   . Eye surgery     Corneal transplant  bilateral eyes  . Knee surgery   . Wrist surgery     x 3  Right  . Litrotripsy     Family History  Problem Relation Age of Onset  . Allergies Mother     atopic  . Asthma Mother   . Heart failure Mother     CHF  . Other Father   . Anesthesia problems Neg Hx     History  Substance Use Topics  . Smoking status: Former Smoker -- 3 years    Types: Cigarettes, Pipe    Quit date: 05/20/1983  . Smokeless tobacco: Never Used  . Alcohol Use: Yes     once year      Review of Systems  All other systems reviewed and are negative.    Allergies  Bee venom; Bacitracin-polymyxin b; Codeine; Norco; Penicillins; Tape; and Chlorhexidine  Home Medications   Current Outpatient Rx  Name Route Sig Dispense Refill  . ALBUTEROL SULFATE HFA 108 (90 BASE) MCG/ACT IN AERS Inhalation Inhale 2 puffs into the lungs every 4 (four) hours as needed. For shortness of breath    . ALBUTEROL SULFATE (2.5 MG/3ML) 0.083% IN NEBU Nebulization Take 2.5 mg by nebulization every 4 (four) hours as needed. For shortness of breath    . ARIPIPRAZOLE 2  MG PO TABS Oral Take 2 mg by mouth daily.    Marland Kitchen CALCIUM CARBONATE-VITAMIN D 600-400 MG-UNIT PO TABS Oral Take 1 tablet by mouth daily.    . DESOXIMETASONE 0.05 % EX CREA Topical Apply 1 application topically 3 (three) times daily as needed. For itching    . DIAZEPAM 10 MG PO TABS Oral Take by mouth 3 (three) times daily.     Marland Kitchen FLUTICASONE-SALMETEROL 500-50 MCG/DOSE IN AEPB Inhalation Inhale 1 puff into the lungs every 12 (twelve) hours.      Marland Kitchen HYDROXYZINE HCL 25 MG PO TABS Oral Take 25 mg by mouth 3 (three) times daily as needed. For itching    . MOMETASONE FUROATE 50 MCG/ACT NA SUSP Nasal Place 2 sprays into the nose daily.     Marland Kitchen MONTELUKAST SODIUM 10 MG PO TABS Oral Take 10 mg by mouth at bedtime.    . OMEPRAZOLE 20 MG PO CPDR Oral Take 20 mg by mouth daily as needed. Acid reflux    . OXYCODONE-ACETAMINOPHEN 5-325 MG PO TABS Oral Take 1 tablet by mouth every 4  (four) hours as needed. For pain    . PREDNISOLONE ACETATE 1 % OP SUSP Both Eyes Place 1 drop into both eyes 3 (three) times daily.    . SERTRALINE HCL 100 MG PO TABS Oral Take 200 mg by mouth daily.     Marland Kitchen TEMAZEPAM 15 MG PO CAPS Oral Take 15 mg by mouth 3 (three) times daily. For insomnia    . TRAZODONE HCL 100 MG PO TABS Oral Take 100 mg by mouth at bedtime as needed. For insomnia    . PREDNISONE 20 MG PO TABS Oral Take 20 mg by mouth daily.      BP 191/114  Pulse 89  Temp(Src) 98 F (36.7 C) (Oral)  Resp 16  SpO2 97%  Physical Exam  Nursing note and vitals reviewed. Constitutional: He is oriented to person, place, and time. He appears well-developed and well-nourished. No distress.       Dry-heaving  HENT:  Head: Normocephalic and atraumatic.  Eyes:       Normal appearance  Neck: Normal range of motion.  Cardiovascular: Normal rate and regular rhythm.        hypertensive  Pulmonary/Chest: Effort normal and breath sounds normal. No respiratory distress.  Abdominal: Soft. Bowel sounds are normal. He exhibits no distension and no mass. There is no rebound and no guarding.       Mild, diffuse lower abd ttp  Genitourinary:       No CVA tenderness.  Foley in place and is overflowing at 500cc.   Musculoskeletal: Normal range of motion.  Neurological: He is alert and oriented to person, place, and time.  Skin: Skin is warm and dry. No rash noted.  Psychiatric: He has a normal mood and affect. His behavior is normal.    ED Course  Procedures (including critical care time)  Labs Reviewed  CBC - Abnormal; Notable for the following:    WBC 10.8 (*)    RBC 4.15 (*)    HCT 37.3 (*)    All other components within normal limits  URINALYSIS, ROUTINE W REFLEX MICROSCOPIC - Abnormal; Notable for the following:    Leukocytes, UA SMALL (*)    All other components within normal limits  POCT I-STAT, CHEM 8 - Abnormal; Notable for the following:    Glucose, Bld 120 (*)    Hemoglobin  12.9 (*)    HCT 38.0 (*)  All other components within normal limits  URINE MICROSCOPIC-ADD ON - Abnormal; Notable for the following:    Bacteria, UA FEW (*)    All other components within normal limits  URINE CULTURE   Dg Abd Acute W/chest  09/16/2011  *RADIOLOGY REPORT*  Clinical Data: Nausea, vomiting.  ACUTE ABDOMEN SERIES (ABDOMEN 2 VIEW & CHEST 1 VIEW)  Comparison: 08/19/2011 CT  Findings: Linear opacity at the right lower lung has developed in the interval, favored to reflect atelectasis. The questionable nodule described on the recent radiograph is not confidently identified however follow-up as recommended at that time should still be acquired. Cardiomediastinal contours are unchanged with tortuous thoracic aorta.  Remote left sixth rib fracture.  No acute osseous abnormality.  Nonobstructive bowel gas pattern.  Upright view is suboptimal. Cannot evaluate for free intraperitoneal air.  Organ outlines normal where seen.  No acute osseous abnormality.  IMPRESSION: Nonspecific bowel gas pattern without evidence for obstruction.  Original Report Authenticated By: Waneta Martins, M.D.     1. Nausea and vomiting   2. UTI (lower urinary tract infection)   3. Urinary retention       MDM  Pt discharged from hospital yesterday afternoon following ORIF right distal radius fx.  Was unable to void at that time.  Presents to ED today w/ persistent urinary retention + diffuse lower abd pain and N/V.  On exam, afebrile, dry-heaving, diffuse, mild abd ttp, no CVA ttp.  Nursing staff placed foley and on my exam, the 500cc bag overflowing. Pt reports that his pain is much improved since.  He has received 2 doses of zofran but continues to be nauseous.  Will try IV reglan.  Labs sig for UTI.  Pt receiving 1g IV rocephin as well as his home dose of losartan for HTN (took himself off of his medication 2 wks ago).    Pt reports that he continues to have lower abd discomfort.  Dr. Nicanor Alcon recommends  flomax for likely bladder spasm.  Will treat w/ 4mg  IV morphine as well and reassess shortly.  7:38 AM   Pt reports that his pain has resolved but he continues to have mild nausea.  Has not however, had any dry-heaving since receiving the reglan.  Will po challenge.  Pt received losartan 20 minutes ago.  On repeat exam, pt appears comfortable, BP stable and abd still mildly ttp in lower quadrants.  8:42 AM   Pt is not tolerating pos.  Vomited shortly after taking 2-3 sips of ginger ale.  Will admit for refractory N/V and UTI.   [facility] consulted for admission.      Otilio Miu, [place] 09/16/11 1827

## 2011-09-16 NOTE — ED Notes (Signed)
Pt reports bilateral lower quadrant abd pain for the past three days - pt does report being constipated for this time and has been having difficulty urinating since yesterday.

## 2011-09-16 NOTE — ED Notes (Signed)
Received verbal order for Leg Bag foley, CBC, I-Stat Chem 8 (Dr. Nicanor Alcon)

## 2011-09-16 NOTE — ED Notes (Signed)
Per EMS: Bp: 210/100 hr: 88 r: 18 Pt is blind

## 2011-09-17 DIAGNOSIS — M79609 Pain in unspecified limb: Secondary | ICD-10-CM

## 2011-09-17 DIAGNOSIS — E782 Mixed hyperlipidemia: Secondary | ICD-10-CM

## 2011-09-17 DIAGNOSIS — R339 Retention of urine, unspecified: Secondary | ICD-10-CM

## 2011-09-17 LAB — COMPREHENSIVE METABOLIC PANEL
ALT: 16 U/L (ref 0–53)
AST: 14 U/L (ref 0–37)
Albumin: 3.2 g/dL — ABNORMAL LOW (ref 3.5–5.2)
CO2: 23 mEq/L (ref 19–32)
Chloride: 99 mEq/L (ref 96–112)
GFR calc non Af Amer: >90 mL/min (ref >90)
Potassium: 3.4 mEq/L — ABNORMAL LOW (ref 3.5–5.1)
Sodium: 134 mEq/L — ABNORMAL LOW (ref 135–145)
Total Bilirubin: 0.4 mg/dL (ref 0.3–1.2)

## 2011-09-17 LAB — CBC
MCH: 31 pg (ref 26.0–34.0)
MCHC: 35.2 g/dL (ref 30.0–36.0)
MCV: 88.1 fL (ref 78.0–100.0)
Platelets: 325 10*3/uL (ref 150–400)
RBC: 4.13 MIL/uL — ABNORMAL LOW (ref 4.22–5.81)

## 2011-09-17 LAB — URINE CULTURE
Culture  Setup Time: 201304300607
Culture: NO GROWTH

## 2011-09-17 MED ORDER — OXYCODONE-ACETAMINOPHEN 5-325 MG PO TABS
2.0000 | ORAL_TABLET | ORAL | Status: DC | PRN
  Administered 2011-09-17 – 2011-09-19 (×11): 2 via ORAL
  Filled 2011-09-17: qty 1
  Filled 2011-09-17 (×10): qty 2

## 2011-09-17 MED ORDER — ENSURE COMPLETE PO LIQD
237.0000 mL | Freq: Three times a day (TID) | ORAL | Status: DC
  Administered 2011-09-17 – 2011-09-19 (×7): 237 mL via ORAL

## 2011-09-17 NOTE — Progress Notes (Signed)
Occupational Therapy Evaluation Patient Details Name: [name] [name] MRN: [id number] DOB: 04-Jun-1950 Today's Date: 09/17/2011 Time: 4098-1191 OT Time Calculation (min): 22 min  OT Assessment / Plan / Recommendation Clinical Impression  Pt recently admitted for ORIF Rt wrist 4/26-4/29/13 and now re-admitted for urinary retention.  Will benefit from acute OT to address below problem list in prep for d/c home with 24/7 assist from friend.    OT Assessment  Patient needs continued OT Services    Follow Up Recommendations  Home health OT    Equipment Recommendations  None recommended by OT    Frequency Min 2X/week    Precautions / Restrictions Restrictions Weight Bearing Restrictions: Yes RUE Weight Bearing: Non weight bearing   Pertinent Vitals/Pain NA    ADL  Eating/Feeding: Performed;Set up (uses adapted spoon in Rt hand) Where Assessed - Eating/Feeding: Bed level Grooming: Performed;Shaving;Set up Occupational psychologist) Where Assessed - Grooming: Supine, head of bed up Upper Body Bathing: Simulated;Minimal assistance Where Assessed - Upper Body Bathing: Sitting, bed Lower Body Bathing: Simulated;Set up Where Assessed - Lower Body Bathing: Sitting, bed Upper Body Dressing: Simulated;Minimal assistance Where Assessed - Upper Body Dressing: Sitting, bed Lower Body Dressing: Simulated;Set up Where Assessed - Lower Body Dressing: Sitting, bed Ambulation Related to ADLs: Declined ambulation at this time. ADL Comments: Pt brought adapted spoon from last admission.  Attempts use of R hand for feeding but does not perform entire task with Rt hand.    OT Goals Acute Rehab OT Goals OT Goal Formulation: With patient Time For Goal Achievement: 09/24/11 Potential to Achieve Goals: Good ADL Goals Pt Will Perform Eating: with modified independence;Unsupported;with adaptive utensils ADL Goal: Eating - Progress: Goal set today Pt Will Perform Grooming: with modified independence;Sitting,  chair;with adaptive equipment ADL Goal: Grooming - Progress: Goal set today Pt Will Perform Upper Body Bathing: with modified independence;Sitting, chair ADL Goal: Upper Body Bathing - Progress: Goal set today Pt Will Perform Upper Body Dressing: with modified independence;Sitting, chair ADL Goal: Upper Body Dressing - Progress: Goal set today Pt Will Transfer to Toilet: with modified independence;Ambulation;Regular height toilet ADL Goal: Toilet Transfer - Progress: Goal set today Pt Will Perform Tub/Shower Transfer: Tub transfer;with modified independence;Ambulation;Shower seat with back ADL Goal: Web designer - Progress: Goal set today Arm Goals Pt Will Perform AROM: Independently;to maintain range of motion;to decrease contractures;Right upper extremity;10 reps;2 sets;Other (comment) (composite flex/ext and tendon gliding/ab/add) Arm Goal: AROM - Progress: Goal set today  Visit Information  Last OT Received On: 09/17/11 Assistance Needed: +1    Subjective Data  Subjective: I have been working with my hand like the therapist showed me. Patient Stated Goal: To get his dexterity back in his Rt hand.   Prior Functioning  Home Living Lives With: Alone Available Help at Discharge: Friend(s) Type of Home: House Home Access: Stairs to enter [employer] of Steps: 2 Home Layout: One level Bathroom Shower/Tub: Engineer, manufacturing systems: Standard Bathroom Accessibility: Yes How Accessible: Accessible via walker Home Adaptive Equipment: Shower chair with back;Other (comment) Additional Comments: Ex-partner is coming to live with him when he returns home. Prior Function Level of Independence: Independent Able to Take Stairs?: Yes Driving: No Vocation: Unemployed Dominant Hand: Right    Cognition  Overall Cognitive Status: Appears within functional limits for tasks assessed/performed Arousal/Alertness: Awake/alert Orientation Level: Appears intact for tasks  assessed Behavior During Session: [facility] for tasks performed    Extremity/Trunk Assessment Right Upper Extremity Assessment RUE ROM/Strength/Tone: Deficits;Due to pain RUE ROM/Strength/Tone  Deficits: Digits limited by stiffness and pain.  Wrist limited by dressing. RUE Sensation: Deficits RUE Sensation Deficits: Tingling in fingertips RUE Coordination: Deficits RUE Coordination Deficits: Decreased fine motor coordination due to pain and stiffness Left Upper Extremity Assessment LUE ROM/Strength/Tone: Within functional levels LUE Sensation: WFL - Light Touch;WFL - Proprioception LUE Coordination: WFL - gross/fine motor   Mobility Bed Mobility Bed Mobility: Supine to Sit;Sitting - Scoot to Edge of Bed;Sit to Supine Supine to Sit: 7: Independent Sitting - Scoot to Edge of Bed: 7: Independent Sit to Supine: 7: Independent   Exercise Hand Exercises Digit Composite Flexion: PROM;AROM;Right;10 reps;Supine;Squeeze ball Composite Extension: PROM;AROM;Right;10 reps;Supine Digit Composite Abduction: PROM;AROM;Right;10 reps;Supine Digit Composite Adduction: PROM;AROM;Right;10 reps;Supine Opposition: PROM;Right;10 reps;Supine Other Exercises Other Exercises: Pt demonstrates limited AROM/PROM in right digits 1-5.  Performed tendon gliding in Rt hand with verbal cueing for technique.  Balance    End of Session OT - End of Session Equipment Utilized During Treatment: Other (comment) (adapted spoon) Activity Tolerance: Patient limited by pain Patient left: in bed;with call bell/phone within reach  09/17/2011 Cipriano Mile OTR/L Pager [phone] Office [phone]  Cipriano Mile 09/17/2011, 5:02 PM

## 2011-09-17 NOTE — Progress Notes (Signed)
Patient ID: [name] [name], male   DOB: 1950/11/09, 61 y.o.   MRN: [id number] Patient states that he is moving his fingers better states that his wrist and hand feel better. Examination patient does have improved extension and flexion of his fingers. He continue to work on the range of motion. He states that his stomach and urinary tract symptoms seem to be improving as well.

## 2011-09-17 NOTE — Progress Notes (Signed)
Subjective: C/o facial pain and R arm pain   Objective: Vital signs in last 24 hours: Filed Vitals:   09/17/11 0400 09/17/11 0600 09/17/11 0608 09/17/11 0930  BP:   172/98   Pulse:  110    Temp:  97.6 F (36.4 C)    TempSrc:  Oral    Resp:  18    Height: 5\' 11"  (1.803 m)     Weight: 69.1 kg (152 lb 5.4 oz)     SpO2:  96%  96%   Weight change:   Intake/Output Summary (Last 24 hours) at 09/17/11 0949 Last data filed at 09/17/11 0630  Gross per 24 hour  Intake   1655 ml  Output    800 ml  Net    855 ml    Physical Exam: General: Awake, Oriented, No acute distress- legally blind HEENT: EOMI. Neck: Supple CV: S1 and S2 Lungs: Clear to ascultation bilaterally Abdomen: Soft, Nontender, Nondistended, +bowel sounds. Ext: Good pulses. Trace edema.   Lab Results:  Basename 09/17/11 0540 09/16/11 1320 09/16/11 0517  NA 134* -- 142  K 3.4* -- 4.1  CL 99 -- 107  CO2 23 -- --  GLUCOSE 119* -- 120*  BUN 11 -- 9  CREATININE 0.83 0.85 --  CALCIUM 9.7 -- --  MG -- -- --  PHOS -- -- --    Basename 09/17/11 0540  AST 14  ALT 16  ALKPHOS 115  BILITOT 0.4  PROT 7.5  ALBUMIN 3.2*   No results found for this basename: LIPASE:2,AMYLASE:2 in the last 72 hours  Basename 09/17/11 0540 09/16/11 1320  WBC 9.7 8.8  NEUTROABS -- --  HGB 12.8* 13.4  HCT 36.4* 37.7*  MCV 88.1 89.3  PLT 325 315   No results found for this basename: CKTOTAL:3,CKMB:3,CKMBINDEX:3,TROPONINI:3 in the last 72 hours No components found with this basename: POCBNP:3 No results found for this basename: DDIMER:2 in the last 72 hours No results found for this basename: HGBA1C:2 in the last 72 hours No results found for this basename: CHOL:2,HDL:2,LDLCALC:2,TRIG:2,CHOLHDL:2,LDLDIRECT:2 in the last 72 hours No results found for this basename: TSH,T4TOTAL,FREET3,T3FREE,THYROIDAB in the last 72 hours No results found for this basename: VITAMINB12:2,FOLATE:2,FERRITIN:2,TIBC:2,IRON:2,RETICCTPCT:2 in the last  72 hours  Micro Results: Recent Results (from the past 240 hour(s))  SURGICAL PCR SCREEN     Status: Abnormal   Collection Time   09/12/11 10:32 AM      Component Value Range Status Comment   MRSA, PCR NEGATIVE  NEGATIVE  Final    Staphylococcus aureus POSITIVE (*) NEGATIVE  Final   URINE CULTURE     Status: Normal   Collection Time   09/16/11  5:21 AM      Component Value Range Status Comment   Specimen Description URINE, RANDOM   Final    Special Requests NONE   Final    Culture  Setup Time [id number]   Final    Colony Count NO GROWTH   Final    Culture NO GROWTH   Final    Report Status 09/17/2011 FINAL   Final     Studies/Results: Dg Abd Acute W/chest  09/16/2011  *RADIOLOGY REPORT*  Clinical Data: Nausea, vomiting.  ACUTE ABDOMEN SERIES (ABDOMEN 2 VIEW & CHEST 1 VIEW)  Comparison: 08/19/2011 CT  Findings: Linear opacity at the right lower lung has developed in the interval, favored to reflect atelectasis. The questionable nodule described on the recent radiograph is not confidently identified however follow-up as recommended at that time should  still be acquired. Cardiomediastinal contours are unchanged with tortuous thoracic aorta.  Remote left sixth rib fracture.  No acute osseous abnormality.  Nonobstructive bowel gas pattern.  Upright view is suboptimal. Cannot evaluate for free intraperitoneal air.  Organ outlines normal where seen.  No acute osseous abnormality.  IMPRESSION: Nonspecific bowel gas pattern without evidence for obstruction.  Original Report Authenticated By: Waneta Martins, M.D.    Medications: I have reviewed the patient's current medications. Scheduled Meds:   . ARIPiprazole  2 mg Oral Daily  . calcium-vitamin D  1 tablet Oral Daily  . cefTRIAXone (ROCEPHIN)  IV  1 g Intravenous Q24H  . docusate sodium  100 mg Oral BID  . enoxaparin  40 mg Subcutaneous Q24H  . fluticasone  1 spray Each Nare Daily  . Fluticasone-Salmeterol  1 puff Inhalation Q12H  .  LORazepam  1 mg Intravenous Once  . montelukast  10 mg Oral QHS  . pantoprazole  40 mg Oral Q1200  . pneumococcal 23 valent vaccine  0.5 mL Intramuscular Tomorrow-1000  . prednisoLONE acetate  1 drop Both Eyes TID  . predniSONE  20 mg Oral Daily  . sertraline  200 mg Oral Daily  . Tamsulosin HCl  0.4 mg Oral Daily  . temazepam  15 mg Oral TID  . DISCONTD: sodium chloride   Intravenous STAT  . DISCONTD: Calcium Carbonate-Vitamin D  1 tablet Oral Daily  . DISCONTD: cefTRIAXone (ROCEPHIN)  IV  1 g Intravenous Q24H  . DISCONTD: Fluticasone-Salmeterol  1 puff Inhalation Q12H  . DISCONTD: Tamsulosin HCl  0.4 mg Oral Daily   Continuous Infusions:   . sodium chloride 100 mL/hr at 09/16/11 1357   PRN Meds:.albuterol, diazepam, fluocinonide cream, hydrALAZINE, HYDROmorphone, hydrOXYzine, ondansetron (ZOFRAN) IV, ondansetron, oxyCODONE-acetaminophen, traZODone, DISCONTD: desoximetasone  Assessment/Plan: 1-Urinary retention/UTI: Patient had foley place, abdomanal pressure has resolved. N/V resolved. Urine culture NGTD- will monitor, Flomax.  remove foley in 24 - 48 hour and give him trial voiding. Will need to make sure patient is able to urinate prior to discharge. No evidence of SBO.  2-Nausea/vomiting: resolved, advance diet 3-OPEN REDUCTION INTERNAL FIXATION (ORIF) DISTAL RADIAL FRACTURE: pain meds prn 4- pain: PRN medications- has history of taking 60 mg oxycontin in past to get pain relief  PT/OT- ? SNF     LOS: 1 day  Leonardo Plaia, DO 09/17/2011, 9:49 AM

## 2011-09-17 NOTE — Care Management Note (Addendum)
    Page 1 of 1   09/19/2011     9:52:00 AM   CARE MANAGEMENT NOTE 09/19/2011  Patient:  [name] [name], [name] [name]   Account Number:  [id number]  Date Initiated:  09/17/2011  Documentation initiated by:  MAYO,HENRIETTA  Subjective/Objective Assessment:   61 yr-old male adm with dx of urinary retention/UTI; has aide through [facility], active with [facility].     Action/Plan:   Anticipated DC Date:  09/19/2011   Anticipated DC Plan:  HOME W HOME HEALTH SERVICES      DC Planning Services  CM consult      [facility] Choice  Resumption Of Svcs/PTA Provider   Choice offered to / List presented to:          [facility] arranged  HH-1 RN  HH-2 PT      [facility] agency  [facility]   Status of service:   Medicare Important Message given?   (If response is "NO", the following Medicare IM given date fields will be blank) Date Medicare IM given:   Date Additional Medicare IM given:    Discharge Disposition:  HOME W HOME HEALTH SERVICES  Per UR Regulation:    If discussed at Long Length of Stay Meetings, dates discussed:    Comments:  PCP:  Dr. Dorothyann Peng 5/3 9:49a debbie Madeleine Fenn rn,bsn have alerted mary w [facility] of poss dc today.  09/18/11 11:06 Junius Creamer rn,bsn [phone] spoke w sw and pt does not want placemnt. pt plans to return to apt w roommate and pcs aid and [facility].  09/17/11 1030 Henrietta Mayo RN MSN CCM Pt is legally blind, has an aide 2.5 hrs/day, twice a week, through [facility].  Was living alone but reports that a roommate is moving in this week.  CSW discussed alternate options but pt plans to return home with home health following.

## 2011-09-17 NOTE — Progress Notes (Signed)
INITIAL ADULT NUTRITION ASSESSMENT Date: 09/17/2011   Time: 8:36 AM Reason for Assessment: Nutrition Risk  ASSESSMENT: Male 61 y.o.  Dx: Urinary retention   Hx:  Past Medical History  Diagnosis Date  . 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 Cough   . Esophageal reflux   . Osteopenia   . Personal history of other diseases of digestive system   . Contact dermatitis and other eczema, due to unspecified cause   . Unspecified sinusitis (chronic)   . Allergic rhinitis   . Unspecified asthma   . COPD (chronic obstructive pulmonary disease)   . Hypertension   . Seasonal allergies   . Ulcerative colitis   . H/O hiatal hernia   . Arthritis     spine  . Anxiety   . Depression   . Multiple open wounds     "from esczema- scratching"  . Multiple facial bone fractures   . 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 History of bronchitis   . Pneumonia     in past had several times  . Shortness of breath     "sometime"  . Chronic lower back pain   . Kidney stones    Related Meds:     . ARIPiprazole  2 mg Oral Daily  . calcium-vitamin D  1 tablet Oral Daily  . cefTRIAXone (ROCEPHIN)  IV  1 g Intravenous Q24H  . docusate sodium  100 mg Oral BID  . enoxaparin  40 mg Subcutaneous Q24H  . fluticasone  1 spray Each Nare Daily  . Fluticasone-Salmeterol  1 puff Inhalation Q12H  . LORazepam  1 mg Intravenous Once  . montelukast  10 mg Oral QHS  . pantoprazole  40 mg Oral Q1200  . pneumococcal 23 valent vaccine  0.5 mL Intramuscular Tomorrow-1000  . prednisoLONE acetate  1 drop Both Eyes TID  . predniSONE  20 mg Oral Daily  . sertraline  200 mg Oral Daily  . Tamsulosin HCl  0.4 mg Oral Once  . Tamsulosin HCl  0.4 mg Oral Daily  . temazepam  15 mg Oral TID  . DISCONTD: sodium chloride   Intravenous STAT  . DISCONTD: Calcium Carbonate-Vitamin D  1 tablet Oral Daily  . DISCONTD: cefTRIAXone (ROCEPHIN)  IV  1 g Intravenous Q24H  . DISCONTD:  Fluticasone-Salmeterol  1 puff Inhalation Q12H  . DISCONTD: Tamsulosin HCl  0.4 mg Oral Daily    Ht: 5\' 11"  (180.3 cm)  Wt: 152 lb 5.4 oz (69.1 kg)  Ideal Wt: 78.1 kg % Ideal Wt: 88%  Usual Wt: 165 lbs per pt Wt Readings from Last 10 Encounters:  09/17/11 152 lb 5.4 oz (69.1 kg)  09/11/11 165 lb (74.844 kg)  09/11/11 165 lb (74.844 kg)  08/20/11 172 lb 9.9 oz (78.3 kg)  06/04/11 167 lb 6.4 oz (75.932 kg)  03/18/11 174 lb 3.2 oz (79.017 kg)  01/07/11 175 lb (79.379 kg)  11/26/10 171 lb 12.8 oz (77.928 kg)  08/06/10 173 lb (78.472 kg)  07/16/10 162 lb (73.483 kg)   % Usual Wt: 92% 8% in 5 weeks, since breaking his hand  Body mass index is 21.25 kg/(m^2). WNL  Food/Nutrition Related Hx: Pt is blind Right distal radius malunion open reduction internal fixation on 4-26, discharge from hospital 4-29 Pt readmitted on 4/30 for N/V, being treated to UTI. Per pt nausea is better and he is hungry.  Per pt he  has lost wt (8%) in 5 weeks after breaking his hand/face/ribs. Report wt loss is due to pain, inability to chew foods. His neighbors have been bringing him foods that are easy to eat such as soup, yogurt, pudding. He has been eating these foods throughout the day but intake of these foods have been inadequate likely meeting </= 50% of his estimated needs in >/= 5 days.  Pt meets criteria for severe malnutrition in the context of acute illness/injury with 8% wt loss in 5 weeks and intake that is </= 50% of his estimated needs.   Labs:  CMP     Component Value Date/Time   NA 134* 09/17/2011 0540   K 3.4* 09/17/2011 0540   CL 99 09/17/2011 0540   CO2 23 09/17/2011 0540   GLUCOSE 119* 09/17/2011 0540   BUN 11 09/17/2011 0540   CREATININE 0.83 09/17/2011 0540   CALCIUM 9.7 09/17/2011 0540   PROT 7.5 09/17/2011 0540   ALBUMIN 3.2* 09/17/2011 0540   AST 14 09/17/2011 0540   ALT 16 09/17/2011 0540   ALKPHOS 115 09/17/2011 0540   BILITOT 0.4 09/17/2011 0540   GFRNONAA >90 09/17/2011 0540   GFRAA >90 09/17/2011  0540  CBG (last 3)  No results found for this basename: GLUCAP:3 in the last 72 hours No results found for this basename: HGBA1C    Intake/Output Summary (Last 24 hours) at 09/17/11 0838 Last data filed at 09/17/11 0630  Gross per 24 hour  Intake   1655 ml  Output    800 ml  Net    855 ml    Diet Order: Clear Liquid  Supplements/Tube Feeding: none  IVF:    sodium chloride Last Rate: 100 mL/hr at 09/16/11 1357    Estimated Nutritional Needs:   Kcal: 1800-2000 Protein: 90-100 grams Fluid: > 2L/day  NUTRITION DIAGNOSIS: -Inadequate oral intake (NI-2.1).  Status: Ongoing  RELATED TO: chewing problems, lack of support at home  AS EVIDENCE BY: 8% wt loss PTA  MONITORING/EVALUATION(Goals): Goal: Pt will consume >/= 90% of estimated needs Monitor: po intake, weight, labs  EDUCATION NEEDS: -Education needs addressed; discussed examples of high calorie/high protein foods, supplement beverages  INTERVENTION:  Advance to Pureed diet per MD  Ensure complete TID  Dietitian #:[phone]  DOCUMENTATION CODES Per approved criteria  -Severe malnutrition in the context of acute illness or injury    Kendell Bane Cornelison 09/17/2011, 8:36 AM

## 2011-09-17 NOTE — Progress Notes (Signed)
Clinical Social Work Department BRIEF PSYCHOSOCIAL ASSESSMENT 09/17/2011  Patient:  [name] [name], [name] [name]     Account Number:  [id number]     Admit date:  09/16/2011  Clinical Social Worker:  Dennison Bulla  Date/Time:  09/17/2011 10:15 AM  Referred by:  Physician  Date Referred:  09/17/2011 Referred for  SNF Placement  Psychosocial assessment   Other Referral:   Interview type:  Patient Other interview type:    PSYCHOSOCIAL DATA Living Status:  ALONE Admitted from facility:   Level of care:   Primary support name:  Thayer Ohm Primary support relationship to patient:  FRIEND Degree of support available:   Adequate    CURRENT CONCERNS Current Concerns  Post-Acute Placement   Other Concerns:    SOCIAL WORK ASSESSMENT / PLAN CSW received referral from RN and MD due to patient needing assistance with placement or HH at dc. CSW met with patient at bedside. Patient was alert and oriented and watching tv. CSW spoke with patient regarding his current plans. Patient is legally blind and lives on his own. Patient was in a relationship with his partner for 18 years but they have been separated for 8 years. Patient was able to identify other supports such as friends and neighbors. Patient reported that his landlord is helpful and is also helping. Patient reports that he has a SW through [employer] and has an aide that comes 2 times a week for 2.5 hours a day. CSW discussed SNF vs ALF vs HH. Patient reports that partner is moving back into his home and can assist. Patient refused faciliy placement. Patient spoke with CSW in legnth about his relationship and his depression. Patient attends [facility] every 3 months for medication management. Patient was agreeable to contacting [facility] in order to schedule therapy sessions. Patient reports no further concerns at this time. CSW is signing off but available if needed.   Assessment/plan status:  No Further Intervention Required Other assessment/ plan:     Information/referral to community resources:   SNF and ALF information    PATIENT'S/FAMILY'S RESPONSE TO PLAN OF CARE: Patient was receptive to consult and open to discussing plans. Patient is involved with several local agencies and was able to identify supports. Patient reports he does not want placement because he has lived on his own since he was 61 years old and is able to care for himself. Patient is aware of CSW ability to assist with placement if he desires.

## 2011-09-18 DIAGNOSIS — M79609 Pain in unspecified limb: Secondary | ICD-10-CM

## 2011-09-18 DIAGNOSIS — R339 Retention of urine, unspecified: Secondary | ICD-10-CM

## 2011-09-18 DIAGNOSIS — E782 Mixed hyperlipidemia: Secondary | ICD-10-CM

## 2011-09-18 MED ORDER — DIAZEPAM 2 MG PO TABS
2.0000 mg | ORAL_TABLET | Freq: Three times a day (TID) | ORAL | Status: DC
  Administered 2011-09-18 – 2011-09-19 (×4): 2 mg via ORAL
  Filled 2011-09-18 (×4): qty 1

## 2011-09-18 MED ORDER — TEMAZEPAM 15 MG PO CAPS: 15.0000 mg | ORAL_CAPSULE | Freq: Every evening | ORAL | Status: DC | PRN

## 2011-09-18 MED ORDER — DIAZEPAM 2 MG PO TABS: 2.0000 mg | ORAL_TABLET | Freq: Three times a day (TID) | ORAL | Status: DC

## 2011-09-18 NOTE — Progress Notes (Signed)
Subjective: States he is on valium at home and has not been getting these medications N/V resolved   Objective: Vital signs in last 24 hours: Filed Vitals:   09/17/11 2140 09/18/11 0504 09/18/11 0710 09/18/11 0901  BP:  122/73    Pulse:  68  75  Temp:  98.8 F (37.1 C)    TempSrc:  Oral    Resp:  18  18  Height:      Weight:   77.3 kg (170 lb 6.7 oz)   SpO2: 96% 96%  97%   Weight change:   Intake/Output Summary (Last 24 hours) at 09/18/11 0907 Last data filed at 09/18/11 0550  Gross per 24 hour  Intake 2333.33 ml  Output    500 ml  Net 1833.33 ml    Physical Exam: General: Awake, Oriented, No acute distress- legally blind HEENT: EOMI. Neck: Supple CV: S1 and S2 Lungs: Clear to ascultation bilaterally Abdomen: Soft, Nontender, Nondistended, +bowel sounds. Ext: Good pulses. Trace edema.   Lab Results:  Basename 09/17/11 0540 09/16/11 1320 09/16/11 0517  NA 134* -- 142  K 3.4* -- 4.1  CL 99 -- 107  CO2 23 -- --  GLUCOSE 119* -- 120*  BUN 11 -- 9  CREATININE 0.83 0.85 --  CALCIUM 9.7 -- --  MG -- -- --  PHOS -- -- --    Basename 09/17/11 0540  AST 14  ALT 16  ALKPHOS 115  BILITOT 0.4  PROT 7.5  ALBUMIN 3.2*   No results found for this basename: LIPASE:2,AMYLASE:2 in the last 72 hours  Basename 09/17/11 0540 09/16/11 1320  WBC 9.7 8.8  NEUTROABS -- --  HGB 12.8* 13.4  HCT 36.4* 37.7*  MCV 88.1 89.3  PLT 325 315   No results found for this basename: CKTOTAL:3,CKMB:3,CKMBINDEX:3,TROPONINI:3 in the last 72 hours No components found with this basename: POCBNP:3 No results found for this basename: DDIMER:2 in the last 72 hours No results found for this basename: HGBA1C:2 in the last 72 hours No results found for this basename: CHOL:2,HDL:2,LDLCALC:2,TRIG:2,CHOLHDL:2,LDLDIRECT:2 in the last 72 hours No results found for this basename: TSH,T4TOTAL,FREET3,T3FREE,THYROIDAB in the last 72 hours No results found for this basename:  VITAMINB12:2,FOLATE:2,FERRITIN:2,TIBC:2,IRON:2,RETICCTPCT:2 in the last 72 hours  Micro Results: Recent Results (from the past 240 hour(s))  SURGICAL PCR SCREEN     Status: Abnormal   Collection Time   09/12/11 10:32 AM      Component Value Range Status Comment   MRSA, PCR NEGATIVE  NEGATIVE  Final    Staphylococcus aureus POSITIVE (*) NEGATIVE  Final   URINE CULTURE     Status: Normal   Collection Time   09/16/11  5:21 AM      Component Value Range Status Comment   Specimen Description URINE, RANDOM   Final    Special Requests NONE   Final    Culture  Setup Time [id number]   Final    Colony Count NO GROWTH   Final    Culture NO GROWTH   Final    Report Status 09/17/2011 FINAL   Final     Studies/Results: No results found.  Medications: I have reviewed the patient's current medications. Scheduled Meds:    . ARIPiprazole  2 mg Oral Daily  . calcium-vitamin D  1 tablet Oral Daily  . cefTRIAXone (ROCEPHIN)  IV  1 g Intravenous Q24H  . docusate sodium  100 mg Oral BID  . enoxaparin  40 mg Subcutaneous Q24H  . feeding supplement  237 mL Oral TID WC  . fluticasone  1 spray Each Nare Daily  . Fluticasone-Salmeterol  1 puff Inhalation Q12H  . montelukast  10 mg Oral QHS  . pantoprazole  40 mg Oral Q1200  . pneumococcal 23 valent vaccine  0.5 mL Intramuscular Tomorrow-1000  . prednisoLONE acetate  1 drop Both Eyes TID  . predniSONE  20 mg Oral Daily  . sertraline  200 mg Oral Daily  . Tamsulosin HCl  0.4 mg Oral Daily  . temazepam  15 mg Oral TID   Continuous Infusions:    . sodium chloride 100 mL/hr at 09/16/11 1357   PRN Meds:.albuterol, diazepam, fluocinonide cream, hydrALAZINE, HYDROmorphone, hydrOXYzine, ondansetron (ZOFRAN) IV, ondansetron, oxyCODONE-acetaminophen, traZODone, DISCONTD: oxyCODONE-acetaminophen  Assessment/Plan: 1-Urinary retention/UTI: Patient had foley place, abdominal pressure has resolved. N/V resolved. Urine culture NGTD- will monitor, Flomax.   remove foley and give him trial voiding. Will need to make sure patient is able to urinate prior to discharge. No evidence of SBO.  2-Nausea/vomiting: resolved, advance diet 3-OPEN REDUCTION INTERNAL FIXATION (ORIF) DISTAL RADIAL FRACTURE: pain meds prn 4- pain: PRN medications- has history of taking 60 mg oxycontin in past to get pain relief  PT/OT- ? SNF     LOS: 2 days  Jacquelin Krajewski, DO 09/18/2011, 9:07 AM

## 2011-09-18 NOTE — ED Provider Notes (Signed)
Medical screening examination/treatment/procedure(s) were performed by non-physician practitioner and as supervising physician I was immediately available for consultation/collaboration.  Jemar Paulsen K Addi Pak-Rasch, MD 09/18/11 2309 

## 2011-09-18 NOTE — Evaluation (Signed)
Physical Therapy Evaluation Patient Details Name: ANGELICA WIX MRN: [id number] DOB: Jun 13, 1950 Today's Date: 09/18/2011 Time: 1478-2956 PT Time Calculation (min): 25 min  PT Assessment / Plan / Recommendation Clinical Impression  pt presents with UTI and recent R radial fx.  pt ambulating at baseline.  Only needs A secondary to visual deficit and the hospital is a different and busy environment.  pt used this PT for visual A only.  No PT needs.      PT Assessment  Patent does not need any further PT services    Follow Up Recommendations  No PT follow up    Equipment Recommendations  None recommended by PT    Frequency      Precautions / Restrictions Precautions Precautions: None Restrictions Weight Bearing Restrictions: Yes RUE Weight Bearing: Non weight bearing   Pertinent Vitals/Pain       Mobility  Bed Mobility Bed Mobility: Supine to Sit;Sitting - Scoot to Edge of Bed Supine to Sit: 7: Independent Sitting - Scoot to Edge of Bed: 7: Independent Transfers Transfers: Sit to Stand;Stand to Sit Sit to Stand: 7: Independent;With upper extremity assist;From bed Stand to Sit: 7: Independent;With upper extremity assist;With armrests;To chair/3-in-1 Ambulation/Gait Ambulation/Gait Assistance: 5: Supervision Ambulation Distance (Feet): 500 Feet Assistive device: None Ambulation/Gait Assistance Details: pt holds back of PT's elbow seconday to pt is blind.  No deficits noted, only needs A due to visual deficits in a different and busy environment.   Gait Pattern: Within Functional Limits Stairs: No Wheelchair Mobility Wheelchair Mobility: No    Exercises     PT Goals    Visit Information  Last PT Received On: 09/18/11 Assistance Needed: +1    Subjective Data  Subjective: I need to take a good walk.   Patient Stated Goal: Back to normal.     Prior Functioning  Home Living Lives With: Alone Available Help at Discharge: Friend(s) Type of Home: House Home  Access: Stairs to enter [employer] of Steps: 2 Home Layout: One level Bathroom Shower/Tub: Engineer, manufacturing systems: Standard Bathroom Accessibility: Yes How Accessible: Accessible via walker Home Adaptive Equipment: Shower chair with back;Other (comment) Additional Comments: Ex-partner is coming to live with him when he returns home. Prior Function Level of Independence: Independent Able to Take Stairs?: Yes Driving: No Vocation: Unemployed Communication Communication: No difficulties Dominant Hand: Right    Cognition  Overall Cognitive Status: Appears within functional limits for tasks assessed/performed Arousal/Alertness: Awake/alert Orientation Level: Appears intact for tasks assessed Behavior During Session: [facility] for tasks performed    Extremity/Trunk Assessment Right Lower Extremity Assessment RLE ROM/Strength/Tone: [facility] for tasks assessed Left Lower Extremity Assessment LLE ROM/Strength/Tone: [facility] for tasks assessed   Balance Balance Balance Assessed: No  End of Session PT - End of Session Equipment Utilized During Treatment:  (None) Activity Tolerance: Patient tolerated treatment well Patient left: in chair;with call bell/phone within reach Nurse Communication: Mobility status   Sunny Schlein,  [phone] 09/18/2011, 12:33 PM

## 2011-09-18 NOTE — Discharge Instructions (Signed)
[facility] [phone] rn, phy there,aid

## 2011-09-18 NOTE — Progress Notes (Signed)
Occupational Therapy Treatment Patient Details Name: [name] [name] MRN: [id number] DOB: Jul 15, 1950 Today's Date: 09/18/2011 Time: 1520-1540 OT Time Calculation (min): 20 min  OT Assessment / Plan / Recommendation Comments on Treatment Session Pt experiencing significant pain with RUE movement today.  Pt performed HEP with min assist for technique but unable to tolerate more exercises due to pain.  Elevated RUE on pillows.    Follow Up Recommendations  Home health OT;Supervision/Assistance - 24 hour    Equipment Recommendations  None recommended by OT    Frequency Min 2X/week   Plan Discharge plan remains appropriate    Precautions / Restrictions Restrictions Weight Bearing Restrictions: Yes RUE Weight Bearing: Non weight bearing   Pertinent Vitals/Pain RUE pain with movement.    ADL  Eating/Feeding: Performed;Set up (opening packages) Where Assessed - Eating/Feeding: Bed level ADL Comments: Focus of session on RUE HEP    OT Goals ADL Goals Pt Will Perform Eating: with modified independence;Unsupported;with adaptive utensils ADL Goal: Eating - Progress: Progressing toward goals Arm Goals Pt Will Perform AROM: Independently;to maintain range of motion;to decrease contractures;Right upper extremity;10 reps;2 sets;Other (comment) Arm Goal: AROM - Progress: Progressing toward goal Additional Arm Goal #1: indep with positioning and edema control R hand. Arm Goal: Additional Goal #1 - Progress: Progressing toward goals  Visit Information  Last OT Received On: 09/18/11    Subjective Data      Prior Functioning       Cognition  Overall Cognitive Status: Appears within functional limits for tasks assessed/performed Arousal/Alertness: Awake/alert Orientation Level: Appears intact for tasks assessed Behavior During Session: [facility] for tasks performed    Mobility     Exercises General Exercises - Upper Extremity Shoulder Flexion: AAROM;Right;10 reps;Supine Hand  Exercises Digit Composite Flexion: PROM;AROM;Right;10 reps;Supine;Squeeze ball Composite Extension: PROM;AROM;Right;10 reps;Supine Digit Composite Abduction: PROM;AROM;Right;10 reps;Supine Digit Composite Adduction: PROM;AROM;Right;10 reps;Supine Digit Lifts: AROM;PROM;Right;10 reps;Supine Opposition: PROM;Right;10 reps;Supine  Balance    End of Session OT - End of Session Activity Tolerance: Patient limited by pain Patient left: in bed;with call bell/phone within reach;with bed alarm set  09/18/2011 Cipriano Mile OTR/L Pager [phone] Office [phone]  Cipriano Mile 09/18/2011, 4:40 PM

## 2011-09-19 DIAGNOSIS — R339 Retention of urine, unspecified: Secondary | ICD-10-CM

## 2011-09-19 DIAGNOSIS — E782 Mixed hyperlipidemia: Secondary | ICD-10-CM

## 2011-09-19 DIAGNOSIS — M79609 Pain in unspecified limb: Secondary | ICD-10-CM

## 2011-09-19 LAB — BASIC METABOLIC PANEL
CO2: 25 mEq/L (ref 19–32)
Calcium: 8.6 mg/dL (ref 8.4–10.5)
Creatinine, Ser: 0.99 mg/dL (ref 0.50–1.35)
GFR calc non Af Amer: 87 mL/min — ABNORMAL LOW (ref >90)
Glucose, Bld: 88 mg/dL (ref 70–99)
Sodium: 139 mEq/L (ref 135–145)

## 2011-09-19 MED ORDER — TAMSULOSIN HCL 0.4 MG PO CAPS: 0.4000 mg | ORAL_CAPSULE | Freq: Every day | ORAL | Status: DC

## 2011-09-19 MED ORDER — OXYCODONE-ACETAMINOPHEN 5-325 MG PO TABS: 2.0000 | ORAL_TABLET | ORAL | Status: DC | PRN

## 2011-09-19 MED ORDER — DSS 100 MG PO CAPS: 100.0000 mg | ORAL_CAPSULE | Freq: Two times a day (BID) | ORAL | Status: AC

## 2011-09-19 NOTE — Progress Notes (Signed)
Patient [name] [name], 61 year old while male is legally blind and struggling with several health challenges.  Patient expressed appreciation for Chaplain's provision of pastoral presence, prayer, and conversation.  I will follow-up as needed.

## 2011-09-19 NOTE — Progress Notes (Signed)
Clinical Social Work  CSW met with patient per RN request. Patient reports that he needs assistance with paying his energy and water bill but lost his credit card. CSW assisted patient in making phone calls due to patient being legally blind. Patient was successful in paying his energy bill but has to follow up with his water bill at a later time. Per patient, he will need transportation assistance home. CSW completed [facility] paperwork and RN will call CSW when patient is ready. Per patient, he is not comfortable taking the bus because he is legally blind and arm is still hurting. CSW will coordinate transportation when RN is ready.  [place], [place] [phone]

## 2011-09-19 NOTE — Progress Notes (Signed)
OT Cancellation Note  Treatment cancelled today due to pt actively preparing for discharge.  Pt preparing to load onto stretcher as OT arrived in room.  Will defer treatments to f/u HHOT.  09/19/2011 Cipriano Mile OTR/L Pager [phone] Office [phone]

## 2011-09-19 NOTE — Discharge Summary (Signed)
Discharge Summary  [name] [name] MR#: [id number]  DOB:02/11/1951  Date of Admission: 09/16/2011 Date of Discharge: 09/19/2011  Patient's PCP: SANDERS,ROBYN N, MD, MD  Attending Physician:Taison Celani   Discharge Diagnoses: Active Problems:  Fracture of radius, distal, right, closed  Urinary retention  Nausea and vomiting  UTI (lower urinary tract infection)   Brief Admitting History and Physical 60 year old PMH COPD, Legal blindness, right distal radius malunion open reduction internal fixation on 4-26, discharge from hospital 4-29, presents to ED complaining of lower abdominal pressure, urinary retention, nausea and vomiting. Patient had some problems with urine retention during last admission, he had In and out cath with relieved of symptoms. Patient had a foley place today in the ED.    Discharge Medications Medication List  As of 09/19/2011  1:59 PM   STOP taking these medications         predniSONE 20 MG tablet      temazepam 15 MG capsule         TAKE these medications         ADVAIR DISKUS 500-50 MCG/DOSE Aepb   Generic drug: Fluticasone-Salmeterol   Inhale 1 puff into the lungs every 12 (twelve) hours.      albuterol 108 (90 BASE) MCG/ACT inhaler   Commonly known as: PROVENTIL HFA;VENTOLIN HFA   Inhale 2 puffs into the lungs every 4 (four) hours as needed. For shortness of breath      albuterol (2.5 MG/3ML) 0.083% nebulizer solution   Commonly known as: PROVENTIL   Take 2.5 mg by nebulization every 4 (four) hours as needed. For shortness of breath      ARIPiprazole 2 MG tablet   Commonly known as: ABILIFY   Take 2 mg by mouth daily.      Calcium 600+D 600-400 MG-UNIT per tablet   Generic drug: Calcium Carbonate-Vitamin D   Take 1 tablet by mouth daily.      desoximetasone 0.05 % cream   Commonly known as: TOPICORT   Apply 1 application topically 3 (three) times daily as needed. For itching      diazepam 10 MG tablet   Commonly known as: VALIUM    Take by mouth 3 (three) times daily.      DSS 100 MG Caps   Take 100 mg by mouth 2 (two) times daily.      hydrOXYzine 25 MG tablet   Commonly known as: ATARAX/VISTARIL   Take 25 mg by mouth 3 (three) times daily as needed. For itching      montelukast 10 MG tablet   Commonly known as: SINGULAIR   Take 10 mg by mouth at bedtime.      NASONEX 50 MCG/ACT nasal spray   Generic drug: mometasone   Place 2 sprays into the nose daily.      omeprazole 20 MG capsule   Commonly known as: PRILOSEC   Take 20 mg by mouth daily as needed. Acid reflux      oxyCODONE-acetaminophen 5-325 MG per tablet   Commonly known as: PERCOCET   Take 2 tablets by mouth every 4 (four) hours as needed for pain. For pain      prednisoLONE acetate 1 % ophthalmic suspension   Commonly known as: PRED FORTE   Place 1 drop into both eyes 3 (three) times daily.      sertraline 100 MG tablet   Commonly known as: ZOLOFT   Take 200 mg by mouth daily.      Tamsulosin HCl   0.4 MG Caps   Commonly known as: FLOMAX   Take 1 capsule (0.4 mg total) by mouth daily.      traZODone 100 MG tablet   Commonly known as: DESYREL   Take 100 mg by mouth at bedtime as needed. For insomnia            Hospital Course: 1-Urinary retention/UTI: Patient had foley place, abdominal pressure has resolved. N/V resolved. Urine culture NGTD- will monitor, Flomax.  foley removed and give him trial voiding-did well, checked PVR.  No evidence of SBO.  2-Nausea/vomiting: resolved, advance diet  3-OPEN REDUCTION INTERNAL FIXATION (ORIF) DISTAL RADIAL FRACTURE: pain meds prn  4- pain: PRN medications-  Home health     Day of Discharge BP 174/87  Pulse 56  Temp(Src) 97.6 F (36.4 C) (Oral)  Resp 18  Ht 5' 11" (1.803 m)  Wt 77.3 kg (170 lb 6.7 oz)  BMI 23.77 kg/m2  SpO2 92%  Results for orders placed during the hospital encounter of 09/16/11 (from the past 48 hour(s))  BASIC METABOLIC PANEL     Status: Abnormal   Collection  Time   09/19/11  5:55 AM      Component Value Range Comment   Sodium 139  135 - 145 (mEq/L)    Potassium 3.9  3.5 - 5.1 (mEq/L)    Chloride 106  96 - 112 (mEq/L)    CO2 25  19 - 32 (mEq/L)    Glucose, Bld 88  70 - 99 (mg/dL)    BUN 19  6 - 23 (mg/dL)    Creatinine, Ser 0.99  0.50 - 1.35 (mg/dL)    Calcium 8.6  8.4 - 10.5 (mg/dL)    GFR calc non Af Amer 87 (*) >90 (mL/min)    GFR calc Af Amer >90  >90 (mL/min)     Dg Chest 2 View  09/12/2011  *RADIOLOGY REPORT*  Clinical Data: Preoperative films for patient with an ankle fracture.  CHEST - 2 VIEW  Comparison: Plain films 09/06/2011 and 05/28/2009.  Findings: There is a subtle nodular opacity in the right upper lobe which is not seen on the comparison studies.  The lungs otherwise appear clear.  Heart size is normal.  No pneumothorax. Remote appearing left sixth rib fracture is noted.  The chest is hyperexpanded.  IMPRESSION:  1.  New subtle nodular opacity in the right upper lobe could be due to the end of the second rib.  Developing infection or a pulmonary nodule could create a similar appearance.  Recommend repeat PA and lateral chest films of 4-6 weeks. 2.  Emphysema. 3.  Old left sixth rib fracture.  Original Report Authenticated By: THOMAS L. D'ALESSIO, M.D.   Dg Chest 2 View  09/06/2011  *RADIOLOGY REPORT*  Clinical Data: Cough, congestion  CHEST - 2 VIEW  Comparison: 08/19/2011, 05/28/2009  Findings: Lungs are essentially clear. No pleural effusion or pneumothorax.  Cardiomediastinal silhouette is within normal limits.  Degenerative changes of the visualized thoracolumbar spine.  Stable compression deformities involving lower thoracic/upper lumbar vertebral bodies.  IMPRESSION: No evidence of acute cardiopulmonary disease.  Original Report Authenticated By: SRIYESH KRISHNAN, M.D.   Dg Abd Acute W/chest  09/16/2011  *RADIOLOGY REPORT*  Clinical Data: Nausea, vomiting.  ACUTE ABDOMEN SERIES (ABDOMEN 2 VIEW & CHEST 1 VIEW)  Comparison:  08/19/2011 CT  Findings: Linear opacity at the right lower lung has developed in the interval, favored to reflect atelectasis. The questionable nodule described on the recent radiograph is not   confidently identified however follow-up as recommended at that time should still be acquired. Cardiomediastinal contours are unchanged with tortuous thoracic aorta.  Remote left sixth rib fracture.  No acute osseous abnormality.  Nonobstructive bowel gas pattern.  Upright view is suboptimal. Cannot evaluate for free intraperitoneal air.  Organ outlines normal where seen.  No acute osseous abnormality.  IMPRESSION: Nonspecific bowel gas pattern without evidence for obstruction.  Original Report Authenticated By: ANDREW J. DELGAIZO, M.D.     Disposition: home  Diet: cardiac  Activity: as tolerated   Follow-up Appts: Discharge Orders    Future Appointments: Provider: Department: Dept Phone: Center:   10/09/2011 11:00 AM Steven L Zacks, MD [facility] [phone] [facility]     Future Orders Please Complete By Expires   Diet - low sodium heart healthy      Increase activity slowly      Discharge instructions      Comments:   Home health PT/OT/RN-aide through [facility], active with [facility].         Time spent on discharge, talking to the patient, and coordinating care: 45 mins.   Signed: Junius Faucett, DO 09/19/2011, 1:59 PM    

## 2011-09-19 NOTE — Progress Notes (Signed)
Clinical Social Work  CSW received call from [employer] that patient would be ready to dc at 3pm. CSW called [facility] and scheduled a pick up for after 3pm. CSW is signing off.  [place], [place] [phone]

## 2011-09-19 NOTE — Progress Notes (Signed)
Discharge Summary  [name] [name] MR#: [id number]  DOB:05-29-1950  Date of Admission: 09/16/2011 Date of Discharge: 09/19/2011  Patient's PCP: Gwynneth Aliment, MD, MD  Attending Physician:Darlisha Kelm   Discharge Diagnoses: Active Problems:  Fracture of radius, distal, right, closed  Urinary retention  Nausea and vomiting  UTI (lower urinary tract infection)   Brief Admitting History and Physical 61 year old PMH COPD, Legal blindness, right distal radius malunion open reduction internal fixation on 4-26, discharge from hospital 4-29, presents to ED complaining of lower abdominal pressure, urinary retention, nausea and vomiting. Patient had some problems with urine retention during last admission, he had In and out cath with relieved of symptoms. Patient had a foley place today in the ED.    Discharge Medications Medication List  As of 09/19/2011  1:59 PM   STOP taking these medications         predniSONE 20 MG tablet      temazepam 15 MG capsule         TAKE these medications         ADVAIR DISKUS 500-50 MCG/DOSE Aepb   Generic drug: Fluticasone-Salmeterol   Inhale 1 puff into the lungs every 12 (twelve) hours.      albuterol 108 (90 BASE) MCG/ACT inhaler   Commonly known as: PROVENTIL HFA;VENTOLIN HFA   Inhale 2 puffs into the lungs every 4 (four) hours as needed. For shortness of breath      albuterol (2.5 MG/3ML) 0.083% nebulizer solution   Commonly known as: PROVENTIL   Take 2.5 mg by nebulization every 4 (four) hours as needed. For shortness of breath      ARIPiprazole 2 MG tablet   Commonly known as: ABILIFY   Take 2 mg by mouth daily.      Calcium 600+D 600-400 MG-UNIT per tablet   Generic drug: Calcium Carbonate-Vitamin D   Take 1 tablet by mouth daily.      desoximetasone 0.05 % cream   Commonly known as: TOPICORT   Apply 1 application topically 3 (three) times daily as needed. For itching      diazepam 10 MG tablet   Commonly known as: VALIUM    Take by mouth 3 (three) times daily.      DSS 100 MG Caps   Take 100 mg by mouth 2 (two) times daily.      hydrOXYzine 25 MG tablet   Commonly known as: ATARAX/VISTARIL   Take 25 mg by mouth 3 (three) times daily as needed. For itching      montelukast 10 MG tablet   Commonly known as: SINGULAIR   Take 10 mg by mouth at bedtime.      NASONEX 50 MCG/ACT nasal spray   Generic drug: mometasone   Place 2 sprays into the nose daily.      omeprazole 20 MG capsule   Commonly known as: PRILOSEC   Take 20 mg by mouth daily as needed. Acid reflux      oxyCODONE-acetaminophen 5-325 MG per tablet   Commonly known as: PERCOCET   Take 2 tablets by mouth every 4 (four) hours as needed for pain. For pain      prednisoLONE acetate 1 % ophthalmic suspension   Commonly known as: PRED FORTE   Place 1 drop into both eyes 3 (three) times daily.      sertraline 100 MG tablet   Commonly known as: ZOLOFT   Take 200 mg by mouth daily.      Tamsulosin HCl  0.4 MG Caps   Commonly known as: FLOMAX   Take 1 capsule (0.4 mg total) by mouth daily.      traZODone 100 MG tablet   Commonly known as: DESYREL   Take 100 mg by mouth at bedtime as needed. For insomnia            Hospital Course: 1-Urinary retention/UTI: Patient had foley place, abdominal pressure has resolved. N/V resolved. Urine culture NGTD- will monitor, Flomax.  foley removed and give him trial voiding-did well, checked PVR.  No evidence of SBO.  2-Nausea/vomiting: resolved, advance diet  3-OPEN REDUCTION INTERNAL FIXATION (ORIF) DISTAL RADIAL FRACTURE: pain meds prn  4- pain: PRN medications-  Home health     Day of Discharge BP 174/87  Pulse 56  Temp(Src) 97.6 F (36.4 C) (Oral)  Resp 18  Ht 5\' 11"  (1.803 m)  Wt 77.3 kg (170 lb 6.7 oz)  BMI 23.77 kg/m2  SpO2 92%  Results for orders placed during the hospital encounter of 09/16/11 (from the past 48 hour(s))  BASIC METABOLIC PANEL     Status: Abnormal   Collection  Time   09/19/11  5:55 AM      Component Value Range Comment   Sodium 139  135 - 145 (mEq/L)    Potassium 3.9  3.5 - 5.1 (mEq/L)    Chloride 106  96 - 112 (mEq/L)    CO2 25  19 - 32 (mEq/L)    Glucose, Bld 88  70 - 99 (mg/dL)    BUN 19  6 - 23 (mg/dL)    Creatinine, Ser 1.61  0.50 - 1.35 (mg/dL)    Calcium 8.6  8.4 - 10.5 (mg/dL)    GFR calc non Af Amer 87 (*) >90 (mL/min)    GFR calc Af Amer >90  >90 (mL/min)     Dg Chest 2 View  09/12/2011  *RADIOLOGY REPORT*  Clinical Data: Preoperative films for patient with an ankle fracture.  CHEST - 2 VIEW  Comparison: Plain films 09/06/2011 and 05/28/2009.  Findings: There is a subtle nodular opacity in the right upper lobe which is not seen on the comparison studies.  The lungs otherwise appear clear.  Heart size is normal.  No pneumothorax. Remote appearing left sixth rib fracture is noted.  The chest is hyperexpanded.  IMPRESSION:  1.  New subtle nodular opacity in the right upper lobe could be due to the end of the second rib.  Developing infection or a pulmonary nodule could create a similar appearance.  Recommend repeat PA and lateral chest films of 4-6 weeks. 2.  Emphysema. 3.  Old left sixth rib fracture.  Original Report Authenticated By: Bernadene Bell. Maricela Curet, M.D.   Dg Chest 2 View  09/06/2011  *RADIOLOGY REPORT*  Clinical Data: Cough, congestion  CHEST - 2 VIEW  Comparison: 08/19/2011, 05/28/2009  Findings: Lungs are essentially clear. No pleural effusion or pneumothorax.  Cardiomediastinal silhouette is within normal limits.  Degenerative changes of the visualized thoracolumbar spine.  Stable compression deformities involving lower thoracic/upper lumbar vertebral bodies.  IMPRESSION: No evidence of acute cardiopulmonary disease.  Original Report Authenticated By: Charline Bills, M.D.   Dg Abd Acute W/chest  09/16/2011  *RADIOLOGY REPORT*  Clinical Data: Nausea, vomiting.  ACUTE ABDOMEN SERIES (ABDOMEN 2 VIEW & CHEST 1 VIEW)  Comparison:  08/19/2011 CT  Findings: Linear opacity at the right lower lung has developed in the interval, favored to reflect atelectasis. The questionable nodule described on the recent radiograph is not  confidently identified however follow-up as recommended at that time should still be acquired. Cardiomediastinal contours are unchanged with tortuous thoracic aorta.  Remote left sixth rib fracture.  No acute osseous abnormality.  Nonobstructive bowel gas pattern.  Upright view is suboptimal. Cannot evaluate for free intraperitoneal air.  Organ outlines normal where seen.  No acute osseous abnormality.  IMPRESSION: Nonspecific bowel gas pattern without evidence for obstruction.  Original Report Authenticated By: Waneta Martins, M.D.     Disposition: home  Diet: cardiac  Activity: as tolerated   Follow-up Appts: Discharge Orders    Future Appointments: Provider: Department: Dept Phone: Center:   10/09/2011 11:00 AM Aris Lot, MD [facility] [phone] [facility]     Future Orders Please Complete By Expires   Diet - low sodium heart healthy      Increase activity slowly      Discharge instructions      Comments:   Home health PT/OT/RN-aide through [facility], active with [facility].         Time spent on discharge, talking to the patient, and coordinating care: 45 mins.   SignedMarlin Canary, DO 09/19/2011, 1:59 PM

## 2011-09-27 ENCOUNTER — Inpatient Hospital Stay (HOSPITAL_COMMUNITY)
Admission: EM | Admit: 2011-09-27 | Discharge: 2011-10-01 | DRG: 684 | Disposition: A | Discharge status: Home / Self Care | Payer: Medicaid Other | Attending: Internal Medicine | Admitting: Internal Medicine

## 2011-09-27 ENCOUNTER — Encounter (HOSPITAL_COMMUNITY): Payer: Self-pay | Admitting: Emergency Medicine

## 2011-09-27 DIAGNOSIS — L259 Unspecified contact dermatitis, unspecified cause: Secondary | ICD-10-CM

## 2011-09-27 DIAGNOSIS — J309 Allergic rhinitis, unspecified: Secondary | ICD-10-CM

## 2011-09-27 DIAGNOSIS — S0083XA Contusion of other part of head, initial encounter: Secondary | ICD-10-CM

## 2011-09-27 DIAGNOSIS — M79601 Pain in right arm: Secondary | ICD-10-CM

## 2011-09-27 DIAGNOSIS — R000 Tachycardia, unspecified: Secondary | ICD-10-CM

## 2011-09-27 DIAGNOSIS — S52501A Unspecified fracture of the lower end of right radius, initial encounter for closed fracture: Secondary | ICD-10-CM

## 2011-09-27 DIAGNOSIS — M949 Disorder of cartilage, unspecified: Secondary | ICD-10-CM | POA: Diagnosis present

## 2011-09-27 DIAGNOSIS — K2900 Acute gastritis without bleeding: Secondary | ICD-10-CM | POA: Diagnosis present

## 2011-09-27 DIAGNOSIS — M899 Disorder of bone, unspecified: Secondary | ICD-10-CM

## 2011-09-27 DIAGNOSIS — N179 Acute kidney failure, unspecified: Principal | ICD-10-CM

## 2011-09-27 DIAGNOSIS — H548 Legal blindness, as defined in USA: Secondary | ICD-10-CM

## 2011-09-27 DIAGNOSIS — E860 Dehydration: Secondary | ICD-10-CM

## 2011-09-27 DIAGNOSIS — Z8719 Personal history of other diseases of the digestive system: Secondary | ICD-10-CM

## 2011-09-27 DIAGNOSIS — M545 Low back pain, unspecified: Secondary | ICD-10-CM | POA: Diagnosis present

## 2011-09-27 DIAGNOSIS — K219 Gastro-esophageal reflux disease without esophagitis: Secondary | ICD-10-CM

## 2011-09-27 DIAGNOSIS — J4489 Other specified chronic obstructive pulmonary disease: Secondary | ICD-10-CM | POA: Diagnosis present

## 2011-09-27 DIAGNOSIS — K859 Acute pancreatitis without necrosis or infection, unspecified: Secondary | ICD-10-CM

## 2011-09-27 DIAGNOSIS — R339 Retention of urine, unspecified: Secondary | ICD-10-CM

## 2011-09-27 DIAGNOSIS — N390 Urinary tract infection, site not specified: Secondary | ICD-10-CM

## 2011-09-27 DIAGNOSIS — M79609 Pain in unspecified limb: Secondary | ICD-10-CM | POA: Diagnosis present

## 2011-09-27 DIAGNOSIS — Z87891 Personal history of nicotine dependence: Secondary | ICD-10-CM

## 2011-09-27 DIAGNOSIS — F1210 Cannabis abuse, uncomplicated: Secondary | ICD-10-CM | POA: Diagnosis present

## 2011-09-27 DIAGNOSIS — D72829 Elevated white blood cell count, unspecified: Secondary | ICD-10-CM

## 2011-09-27 DIAGNOSIS — R112 Nausea with vomiting, unspecified: Secondary | ICD-10-CM | POA: Diagnosis present

## 2011-09-27 DIAGNOSIS — J329 Chronic sinusitis, unspecified: Secondary | ICD-10-CM

## 2011-09-27 DIAGNOSIS — S0292XA Unspecified fracture of facial bones, initial encounter for closed fracture: Secondary | ICD-10-CM

## 2011-09-27 DIAGNOSIS — J45909 Unspecified asthma, uncomplicated: Secondary | ICD-10-CM

## 2011-09-27 DIAGNOSIS — S06309A Unspecified focal traumatic brain injury with loss of consciousness of unspecified duration, initial encounter: Secondary | ICD-10-CM

## 2011-09-27 DIAGNOSIS — J449 Chronic obstructive pulmonary disease, unspecified: Secondary | ICD-10-CM | POA: Diagnosis present

## 2011-09-27 MED ORDER — SODIUM CHLORIDE 0.9 % IV SOLN
1000.0000 mL | INTRAVENOUS | Status: AC
  Administered 2011-09-28: 1000 mL via INTRAVENOUS

## 2011-09-27 MED ORDER — SODIUM CHLORIDE 0.9 % IV SOLN
1000.0000 mL | Freq: Once | INTRAVENOUS | Status: AC
  Administered 2011-09-28: 1000 mL via INTRAVENOUS
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
## 2011-09-27 NOTE — ED Notes (Addendum)
Dizziness, fainting spills and nausea upon standing, unable void since yesterday, unable to keep any meal down for few days now. Vomited  on tuesday since them able to keep any thing down

## 2011-09-28 ENCOUNTER — Inpatient Hospital Stay (HOSPITAL_COMMUNITY): Payer: Medicaid Other

## 2011-09-28 ENCOUNTER — Encounter (HOSPITAL_COMMUNITY): Payer: Self-pay | Admitting: *Deleted

## 2011-09-28 DIAGNOSIS — M79609 Pain in unspecified limb: Secondary | ICD-10-CM

## 2011-09-28 DIAGNOSIS — N179 Acute kidney failure, unspecified: Secondary | ICD-10-CM

## 2011-09-28 DIAGNOSIS — D72829 Elevated white blood cell count, unspecified: Secondary | ICD-10-CM | POA: Diagnosis present

## 2011-09-28 DIAGNOSIS — R339 Retention of urine, unspecified: Secondary | ICD-10-CM

## 2011-09-28 DIAGNOSIS — K859 Acute pancreatitis without necrosis or infection, unspecified: Secondary | ICD-10-CM | POA: Diagnosis present

## 2011-09-28 DIAGNOSIS — E860 Dehydration: Secondary | ICD-10-CM | POA: Diagnosis present

## 2011-09-28 DIAGNOSIS — M79601 Pain in right arm: Secondary | ICD-10-CM

## 2011-09-28 LAB — URINALYSIS, ROUTINE W REFLEX MICROSCOPIC
Glucose, UA: NEGATIVE mg/dL
Ketones, ur: NEGATIVE mg/dL
Leukocytes, UA: NEGATIVE
pH: 5 (ref 5.0–8.0)

## 2011-09-28 LAB — COMPREHENSIVE METABOLIC PANEL
AST: 17 U/L (ref 0–37)
Albumin: 3.6 g/dL (ref 3.5–5.2)
Alkaline Phosphatase: 85 U/L (ref 39–117)
BUN: 31 mg/dL — ABNORMAL HIGH (ref 6–23)
Calcium: 8.4 mg/dL (ref 8.4–10.5)
Chloride: 99 mEq/L (ref 96–112)
Creatinine, Ser: 1.94 mg/dL — ABNORMAL HIGH (ref 0.50–1.35)
Creatinine, Ser: 2.55 mg/dL — ABNORMAL HIGH (ref 0.50–1.35)
GFR calc Af Amer: 42 mL/min — ABNORMAL LOW (ref >90)
Glucose, Bld: 87 mg/dL (ref 70–99)
Potassium: 3.8 mEq/L (ref 3.5–5.1)
Total Bilirubin: 0.4 mg/dL (ref 0.3–1.2)
Total Protein: 6.4 g/dL (ref 6.0–8.3)

## 2011-09-28 LAB — CBC
HCT: 34 % — ABNORMAL LOW (ref 39.0–52.0)
HCT: 39.1 % (ref 39.0–52.0)
Hemoglobin: 11.5 g/dL — ABNORMAL LOW (ref 13.0–17.0)
Hemoglobin: 13.7 g/dL (ref 13.0–17.0)
RBC: 3.74 MIL/uL — ABNORMAL LOW (ref 4.22–5.81)
RDW: 13.8 % (ref 11.5–15.5)
WBC: 10.6 10*3/uL — ABNORMAL HIGH (ref 4.0–10.5)
WBC: 14.5 10*3/uL — ABNORMAL HIGH (ref 4.0–10.5)

## 2011-09-28 LAB — DIFFERENTIAL
Lymphocytes Relative: 12 % (ref 12–46)
Lymphs Abs: 1.8 10*3/uL (ref 0.7–4.0)
Monocytes Absolute: 1.8 10*3/uL — ABNORMAL HIGH (ref 0.1–1.0)
Monocytes Relative: 12 % (ref 3–12)
Neutro Abs: 10.6 10*3/uL — ABNORMAL HIGH (ref 1.7–7.7)

## 2011-09-28 MED ORDER — SERTRALINE HCL 100 MG PO TABS
200.0000 mg | ORAL_TABLET | Freq: Every day | ORAL | Status: DC
  Administered 2011-09-28 – 2011-10-01 (×4): 200 mg via ORAL
  Filled 2011-09-28 (×5): qty 2

## 2011-09-28 MED ORDER — ONDANSETRON HCL 4 MG PO TABS: 4.0000 mg | ORAL_TABLET | Freq: Four times a day (QID) | ORAL | Status: DC | PRN

## 2011-09-28 MED ORDER — ACETAMINOPHEN 650 MG RE SUPP: 650.0000 mg | Freq: Four times a day (QID) | RECTAL | Status: DC | PRN

## 2011-09-28 MED ORDER — ARIPIPRAZOLE 2 MG PO TABS
2.0000 mg | ORAL_TABLET | Freq: Every day | ORAL | Status: DC
Start: 2011-09-28 — End: 2011-10-01
  Administered 2011-09-28 – 2011-10-01 (×4): 2 mg via ORAL
  Filled 2011-09-28 (×4): qty 1

## 2011-09-28 MED ORDER — IOHEXOL 300 MG/ML  SOLN
20.0000 mL | INTRAMUSCULAR | Status: AC
  Administered 2011-09-28 (×2): 20 mL via ORAL

## 2011-09-28 MED ORDER — MONTELUKAST SODIUM 10 MG PO TABS
10.0000 mg | ORAL_TABLET | Freq: Every day | ORAL | Status: DC
  Administered 2011-09-28 – 2011-09-30 (×3): 10 mg via ORAL
  Filled 2011-09-28 (×4): qty 1

## 2011-09-28 MED ORDER — MORPHINE SULFATE 4 MG/ML IJ SOLN
4.0000 mg | Freq: Once | INTRAMUSCULAR | Status: AC
  Administered 2011-09-28: 4 mg via INTRAVENOUS
  Filled 2011-09-28: qty 1

## 2011-09-28 MED ORDER — DOCUSATE SODIUM 100 MG PO CAPS
100.0000 mg | ORAL_CAPSULE | Freq: Two times a day (BID) | ORAL | Status: DC
  Administered 2011-09-28 – 2011-10-01 (×3): 100 mg via ORAL
  Filled 2011-09-28 (×5): qty 1

## 2011-09-28 MED ORDER — SODIUM CHLORIDE 0.9 % IV SOLN
100.0000 mL/h | INTRAVENOUS | Status: DC
  Administered 2011-09-28 – 2011-09-29 (×3): 100 mL/h via INTRAVENOUS

## 2011-09-28 MED ORDER — PREDNISOLONE ACETATE 1 % OP SUSP
1.0000 [drp] | Freq: Three times a day (TID) | OPHTHALMIC | Status: DC
Start: 2011-09-28 — End: 2011-10-01
  Administered 2011-09-29 – 2011-10-01 (×5): 1 [drp] via OPHTHALMIC
  Filled 2011-09-28: qty 1

## 2011-09-28 MED ORDER — FLUTICASONE PROPIONATE 50 MCG/ACT NA SUSP
1.0000 | Freq: Every day | NASAL | Status: DC
  Administered 2011-09-28 – 2011-10-01 (×4): 1 via NASAL
  Filled 2011-09-28: qty 16

## 2011-09-28 MED ORDER — HYDROMORPHONE HCL PF 1 MG/ML IJ SOLN
1.0000 mg | INTRAMUSCULAR | Status: DC | PRN
  Administered 2011-09-28 (×4): 1 mg via INTRAVENOUS
  Administered 2011-09-28: 2 mg via INTRAVENOUS
  Filled 2011-09-28: qty 2
  Filled 2011-09-28 (×4): qty 1

## 2011-09-28 MED ORDER — ONDANSETRON HCL 4 MG/2ML IJ SOLN
4.0000 mg | Freq: Once | INTRAMUSCULAR | Status: AC
  Administered 2011-09-28: 4 mg via INTRAVENOUS
  Filled 2011-09-28: qty 2

## 2011-09-28 MED ORDER — TAMSULOSIN HCL 0.4 MG PO CAPS
0.4000 mg | ORAL_CAPSULE | Freq: Every day | ORAL | Status: DC
  Administered 2011-09-28 – 2011-10-01 (×4): 0.4 mg via ORAL
  Filled 2011-09-28 (×4): qty 1

## 2011-09-28 MED ORDER — PANTOPRAZOLE SODIUM 40 MG PO TBEC
40.0000 mg | DELAYED_RELEASE_TABLET | Freq: Every day | ORAL | Status: DC
  Administered 2011-09-28 – 2011-10-01 (×4): 40 mg via ORAL
  Filled 2011-09-28 (×4): qty 1

## 2011-09-28 MED ORDER — HYDROMORPHONE HCL PF 1 MG/ML IJ SOLN
2.0000 mg | INTRAMUSCULAR | Status: DC | PRN
  Administered 2011-09-28 – 2011-09-30 (×7): 2 mg via INTRAVENOUS
  Filled 2011-09-28 (×7): qty 2

## 2011-09-28 MED ORDER — GUAIFENESIN-DM 100-10 MG/5ML PO SYRP
5.0000 mL | ORAL_SOLUTION | ORAL | Status: DC | PRN
  Filled 2011-09-28: qty 5

## 2011-09-28 MED ORDER — FLUTICASONE-SALMETEROL 500-50 MCG/DOSE IN AEPB
1.0000 | INHALATION_SPRAY | Freq: Two times a day (BID) | RESPIRATORY_TRACT | Status: DC
  Administered 2011-09-28 – 2011-10-01 (×7): 1 via RESPIRATORY_TRACT
  Filled 2011-09-28: qty 14

## 2011-09-28 MED ORDER — ALBUTEROL SULFATE (5 MG/ML) 0.5% IN NEBU: 2.5000 mg | INHALATION_SOLUTION | RESPIRATORY_TRACT | Status: DC | PRN

## 2011-09-28 MED ORDER — DIAZEPAM 5 MG PO TABS
10.0000 mg | ORAL_TABLET | Freq: Three times a day (TID) | ORAL | Status: DC
  Administered 2011-09-28 – 2011-10-01 (×9): 10 mg via ORAL
  Filled 2011-09-28: qty 1
  Filled 2011-09-28 (×2): qty 2
  Filled 2011-09-28: qty 1
  Filled 2011-09-28 (×6): qty 2

## 2011-09-28 MED ORDER — ALBUTEROL SULFATE HFA 108 (90 BASE) MCG/ACT IN AERS
2.0000 | INHALATION_SPRAY | RESPIRATORY_TRACT | Status: DC | PRN
  Filled 2011-09-28: qty 6.7

## 2011-09-28 MED ORDER — HYDROMORPHONE HCL PF 1 MG/ML IJ SOLN
1.0000 mg | Freq: Once | INTRAMUSCULAR | Status: AC
  Administered 2011-09-28: 1 mg via INTRAVENOUS
  Filled 2011-09-28: qty 1

## 2011-09-28 MED ORDER — OXYCODONE-ACETAMINOPHEN 5-325 MG PO TABS
2.0000 | ORAL_TABLET | ORAL | Status: DC | PRN
  Administered 2011-09-28 – 2011-10-01 (×15): 2 via ORAL
  Filled 2011-09-28 (×10): qty 2
  Filled 2011-09-28: qty 1
  Filled 2011-09-28 (×6): qty 2

## 2011-09-28 MED ORDER — ONDANSETRON HCL 4 MG/2ML IJ SOLN: 4.0000 mg | Freq: Four times a day (QID) | INTRAMUSCULAR | Status: DC | PRN

## 2011-09-28 MED ORDER — ACETAMINOPHEN 325 MG PO TABS
650.0000 mg | ORAL_TABLET | Freq: Four times a day (QID) | ORAL | Status: DC | PRN
  Filled 2011-09-28: qty 2

## 2011-09-28 MED ORDER — TRAZODONE HCL 100 MG PO TABS
100.0000 mg | ORAL_TABLET | Freq: Every evening | ORAL | Status: DC | PRN
  Filled 2011-09-28: qty 1

## 2011-09-28 MED ORDER — HYDROXYZINE HCL 25 MG PO TABS
25.0000 mg | ORAL_TABLET | Freq: Three times a day (TID) | ORAL | Status: DC | PRN
  Administered 2011-09-28 – 2011-09-30 (×2): 25 mg via ORAL
  Filled 2011-09-28 (×3): qty 1
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
## 2011-09-28 NOTE — Progress Notes (Signed)
Subjective: Feels better this morning, tolerating food, no abdominal pain or diarrhea.  Objective: Vital signs in last 24 hours: Temp:  [97.9 F (36.6 C)-98 F (36.7 C)] 98 F (36.7 C) (05/12 0618) Pulse Rate:  [84-120] 89  (05/12 0618) Resp:  [17-20] 19  (05/12 0618) BP: (90-136)/(52-94) 115/74 mmHg (05/12 0618) SpO2:  [97 %-99 %] 99 % (05/12 0618) Weight:  [69.2 kg (152 lb 8.9 oz)] 69.2 kg (152 lb 8.9 oz) (05/12 0618) Weight change:  Last BM Date: 09/27/11  Intake/Output from previous day: 05/11 0701 - 05/12 0700 In: 1000 [I.V.:1000] Out: -      Physical Exam: General: Comfortable, alert, communicative, fully oriented, not short of breath at rest.  HEENT:  No clinical pallor, no jaundice, no conjunctival injection. Visible buccal mucosa, is still somewhat "dry". NECK:  Supple, JVP not seen, no carotid bruits, no palpable lymphadenopathy, no palpable goiter. CHEST:  Clinically clear to auscultation, no wheezes, no crackles. HEART:  Sounds 1 and 2 heard, normal, regular, no murmurs. ABDOMEN:  Full, soft, non-tender, no palpable organomegaly, no palpable masses, normal bowel sounds. GENITALIA:  Not examined. LOWER EXTREMITIES:  No pitting edema, palpable peripheral pulses. MUSCULOSKELETAL SYSTEM:  Has bandage right forearm, otherwise, unremarkable.. CENTRAL NERVOUS SYSTEM:  No focal neurologic deficit on gross examination.  Lab Results:  Basename 09/27/11 2357  WBC 14.5*  HGB 13.7  HCT 39.1  PLT 388    Basename 09/27/11 2357  NA 139  K 3.8  CL 99  CO2 22  GLUCOSE 101*  BUN 38*  CREATININE 2.55*  CALCIUM 9.8   No results found for this or any previous visit (from the past 240 hour(s)).   Studies/Results: Ct Abdomen Pelvis Wo Contrast  09/28/2011  *RADIOLOGY REPORT*  Clinical Data: Evaluate for pancreatitis.  Abdominal pain.  Cloudy urine.  CT ABDOMEN AND PELVIS WITHOUT CONTRAST  Technique:  Multidetector CT imaging of the abdomen and pelvis was performed  following the standard protocol without intravenous contrast.  Comparison: CT 08/19/2011  Findings: Lung bases are clear.  No pericardial fluid.  No focal hepatic lesion on this noncontrast exam.  The gallbladder, pancreas, spleen, adrenal glands are normal.  There is no evidence of peripancreatic inflammation.  Kidneys appear normal as noncontrast exam.  There are nonobstructing small calculi within the left and right kidney.  No ureterolithiasis.  The stomach, small bowel, and cecum are normal.  The appendix is not identified.  Colon and rectosigmoid colon are normal.  Abdominal aorta normal caliber.  No retroperitoneal lymphadenopathy.  No free fluid the pelvis.  Prostate gland is normal.  Foley catheter within the bladder.  Gas in the bladder presumably related to catheterization. Review of  bone windows demonstrates no aggressive osseous lesions.  There is a compression fracture at L1 which is unchanged from prior.  IMPRESSION:  1.  No evidence of acute pancreatitis. 2.  Bilateral nonobstructing renal calculi. 3.  Gas within the bladder presumably related to catheterization.  Original Report Authenticated By: Genevive Bi, M.D.   Dg Forearm Right  09/28/2011  *RADIOLOGY REPORT*  Clinical Data: Right wrist swelling and pain.  RIGHT FOREARM - 2 VIEW  Comparison: 08/20/2011  Findings: Interval removal of cast material and placement of plate screw fixation across a healing fracture of the distal right radial metaphysis.  Residual fracture deformity.  Displaced fracture fragments.  Callous formation consistent with healing.  Dorsal soft tissue swelling.  No acute fracture or bone erosion.  One of the distal screw heads  is not flushed with the plate.  Screw tips appear to extend close to the distal radial surface.  IMPRESSION: Plate and screw fixation of comminuted fractures of the distal right radius.  No acute fracture.  Original Report Authenticated By: Marlon Pel, M.D.    Medications: Scheduled  Meds:   . sodium chloride  1,000 mL Intravenous Once   Followed by  . sodium chloride  1,000 mL Intravenous Once  . ARIPiprazole  2 mg Oral Daily  . docusate sodium  100 mg Oral BID  .  HYDROmorphone (DILAUDID) injection  1 mg Intravenous Once  . iohexol  20 mL Oral Q1 Hr x 2  . montelukast  10 mg Oral QHS  .  morphine injection  4 mg Intravenous Once  . ondansetron (ZOFRAN) IV  4 mg Intravenous Once  . pantoprazole  40 mg Oral Q1200  . prednisoLONE acetate  1 drop Both Eyes TID  . Tamsulosin HCl  0.4 mg Oral Daily   Continuous Infusions:   . sodium chloride 1,000 mL (09/28/11 0123)   PRN Meds:.acetaminophen, acetaminophen, albuterol, albuterol, albuterol, guaiFENesin-dextromethorphan, HYDROmorphone (DILAUDID) injection, hydrOXYzine, ondansetron (ZOFRAN) IV, ondansetron, oxyCODONE-acetaminophen  Assessment/Plan: Present on Admission:  1. Dehydration/Acute renal failure (ARF): Patient presented with two days of vomiting, ie, 09/23/11-09/24/11, followed by dry heaves, and no further vomiting, but had not eaten or drank much, since. His BUN was 38 and creatinine 2.55 at presentation, consistent with dehydration and pre-renal AKI, against a background of known baseline creatinine on 0.99 on 09/16/11. Fortunately, CT abdomen/pelvis of 09/28/11, showed bilateral nonobstructing renal calculi, but no evidence of hydronephrosis. We are managing with iv fluids and improvement is anticipated. 2. Acute gastritis: Patient's presentation is consistent clinically, with acute gastritis, although etiology is unclear. Although Lipase was mildly elevated at 153, it is doubtful that this is acute pancreatitis, as patient has no risk factors, and CT of the pancreas is normal. We are managing with antiemetics, PPI and iv fluids.  3. Leucocytosis: Suspect etiology is margination/hemoconcentration, du to above conditions. No evidence of infection is present at this time, clinically. We shall follow CBC. 4. Right arm  pain: This is at the site of ORIF right distal radial fracture, 09/12/11. X-ray is pending, but will request Dr Earna Coder al, to weigh in.    LOS: 1 day   Adaleena Mooers,CHRISTOPHER 09/28/2011, 9:04 AM

## 2011-09-28 NOTE — Progress Notes (Signed)
Orthopedic Tech Progress Note Patient Details:  [name] [name] Dec 26, 1950 [id number]  Splint Interventions: Application Splint wrist lt/rt;right wrist   [name] 09/28/2011, 2:09 PM

## 2011-09-28 NOTE — Progress Notes (Signed)
Orthopedic Tech Progress Note Patient Details:  [name] [name] August 29, 1950 [id number]  Type of Splint: Other (comment) (velcro forearm wrist splint) Splint Location: right arm Splint Interventions: Application    [name] 09/28/2011, 9:51 PM

## 2011-09-28 NOTE — H&P (Signed)
PCP:   Gwynneth Aliment, MD, MD  Orthopedics Dr. Lajoyce Corners  Chief Complaint:   Right arm pain, nausea and vomiting   HPI: [name] [name] is a 61 y.o. male   has a past medical history of Legal blindness, as defined in [place]; Cough; Esophageal reflux; Osteopenia; Personal history of other diseases of digestive system; Contact dermatitis and other eczema, due to unspecified cause; Unspecified sinusitis (chronic); Allergic rhinitis; Unspecified asthma; COPD (chronic obstructive pulmonary disease); Hypertension; Seasonal allergies; Ulcerative colitis; H/O hiatal hernia; Arthritis; Anxiety; Depression; Multiple open wounds; Multiple facial bone fractures; Hep C w/o coma, chronic (early 2013); History of bronchitis; Pneumonia; Shortness of breath; Chronic lower back pain; and Kidney stones.   Presented with  [place] and vomiting since Tuesday, have had decreased PO intake. Decreased urine output. Also have been having severe Right arm pain. Feels light headed when he stands up. He had an episode of recent fascial trauma as well as right arm fracture apart that by Dr. Lajoyce Corners. As a complication post operatively he had had problems with urinary retention requiring Foley. Patient has history of chronic pain. He states that his current arm pain is severe worsening of the pain he had before. He is worried that there is an infection of his right forearm. Patient also had history of kidney stones and feels that he was attacked his kidney stones have shifted and caused the back pain now it has improved. He has not had any fevers or chills chest pains or shortness of breath.  His episodes of nausea could be potentially somewhat positioning as they were worse with turning on a couch.   Review of Systems:    Pertinent positives include: nausea, vomiting, few episode of loose stool  Constitutional:  No weight loss, night sweats, Fevers, chills, fatigue, weight loss  HEENT:  No headaches, Difficulty  swallowing,Tooth/dental problems,Sore throat,  No sneezing, itching, ear ache, nasal congestion, post nasal drip,  Cardio-vascular:  No chest pain, Orthopnea, PND, anasarca, dizziness, palpitations.no Bilateral lower extremity swelling  GI:  No heartburn, indigestion, abdominal pain,diarrhea, change in bowel habits, loss of appetite, melena, blood in stool, hematemesis Resp:  no shortness of breath at rest. No dyspnea on exertion, No excess mucus, no productive cough, No non-productive cough, No coughing up of blood.No change in color of mucus.No wheezing. Skin:  no rash or lesions. No jaundice GU:  no dysuria, change in color of urine, no urgency or frequency. No straining to urinate.  No flank pain.  Musculoskeletal:  No joint pain or no joint swelling. No decreased range of motion. No back pain.  Psych:  No change in mood or affect. No depression or anxiety. No memory loss.  Neuro: no localizing neurological complaints, no tingling, no weakness, no double vision, no gait abnormality, no slurred speech, no confusion  Otherwise ROS are negative except for above, 10 systems were reviewed  Past Medical History: Past Medical History  Diagnosis Date  . 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 Cough   . Esophageal reflux   . Osteopenia   . Personal history of other diseases of digestive system   . Contact dermatitis and other eczema, due to unspecified cause   . Unspecified sinusitis (chronic)   . Allergic rhinitis   . Unspecified asthma   . COPD (chronic obstructive pulmonary disease)   . Hypertension   . Seasonal allergies   . Ulcerative colitis   . H/O hiatal hernia   .  Arthritis     spine  . Anxiety   . Depression   . Multiple open wounds     "from esczema- scratching"  . Multiple facial bone fractures   . 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 History of bronchitis   . Pneumonia     in past had several times  . Shortness of breath       "sometime"  . Chronic lower back pain   . Kidney stones    Past Surgical History  Procedure Date  . Litrotripsy   . Orif distal radius fracture 09/12/11    right  . Fracture surgery   . Metacarpal pinning     right hand  . Patella fracture surgery     right  . Tonsillectomy and adenoidectomy   . Eye surgery     3 Corneal transplant (bilateral eyes)     Medications: Prior to Admission medications   Medication Sig Start Date End Date Taking? Authorizing Provider  albuterol (PROVENTIL HFA;VENTOLIN HFA) 108 (90 BASE) MCG/ACT inhaler Inhale 2 puffs into the lungs every 4 (four) hours as needed. For shortness of breath   Yes Historical Provider, MD  albuterol (PROVENTIL) (2.5 MG/3ML) 0.083% nebulizer solution Take 2.5 mg by nebulization every 4 (four) hours as needed. For shortness of breath   Yes Historical Provider, MD  ARIPiprazole (ABILIFY) 2 MG tablet Take 2 mg by mouth daily.   Yes Historical Provider, MD  Calcium Carbonate-Vitamin D (CALCIUM 600+D) 600-400 MG-UNIT per tablet Take 1 tablet by mouth daily.   Yes Historical Provider, MD  desoximetasone (TOPICORT) 0.05 % cream Apply 1 application topically 3 (three) times daily as needed. For itching   Yes Historical Provider, MD  diazepam (VALIUM) 10 MG tablet Take by mouth 3 (three) times daily.    Yes Historical Provider, MD  docusate sodium 100 MG CAPS Take 100 mg by mouth 2 (two) times daily. 09/19/11 09/29/11 Yes Jessica U Vann, DO  Fluticasone-Salmeterol (ADVAIR DISKUS) 500-50 MCG/DOSE AEPB Inhale 1 puff into the lungs every 12 (twelve) hours.     Yes Historical Provider, MD  hydrOXYzine (ATARAX/VISTARIL) 25 MG tablet Take 25 mg by mouth 3 (three) times daily as needed. For itching   Yes Historical Provider, MD  mometasone (NASONEX) 50 MCG/ACT nasal spray Place 2 sprays into the nose daily.    Yes Historical Provider, MD  montelukast (SINGULAIR) 10 MG tablet Take 10 mg by mouth at bedtime.   Yes Historical Provider, MD   omeprazole (PRILOSEC) 20 MG capsule Take 20 mg by mouth daily as needed. Acid reflux   Yes Historical Provider, MD  oxyCODONE-acetaminophen (PERCOCET) 5-325 MG per tablet Take 2 tablets by mouth every 4 (four) hours as needed for pain. For pain 09/19/11  Yes Joseph Art, DO  prednisoLONE acetate (PRED FORTE) 1 % ophthalmic suspension Place 1 drop into both eyes 3 (three) times daily.   Yes Historical Provider, MD  sertraline (ZOLOFT) 100 MG tablet Take 200 mg by mouth daily.    Yes Historical Provider, MD  Tamsulosin HCl (FLOMAX) 0.4 MG CAPS Take 1 capsule (0.4 mg total) by mouth daily. 09/19/11  Yes Joseph Art, DO  traZODone (DESYREL) 100 MG tablet Take 100 mg by mouth at bedtime as needed. For insomnia   Yes Historical Provider, MD    Allergies:   Allergies  Allergen Reactions  . Bacitracin-Polymyxin B Other (See Comments)    Polysporin allergy per opthalmologist.   .  Bee Venom Anaphylaxis  . Codeine Other (See Comments)    Makes patient clear throat   . Norco (Hydrocodone-Acetaminophen) Other (See Comments)    causes frontal headaches  . Penicillins Other (See Comments)    Childhood allergy/ makes patient clear throat a lot.  . Tape Hives    "plastic tape"  . Chlorhexidine Itching and Rash    Social History:  Ambulatory  independently  Lives at  home   reports that he quit smoking about 28 years ago. His smoking use included Cigarettes and Pipe. He has a 3 pack-year smoking history. He has never used smokeless tobacco. He reports that he drinks alcohol. He reports that he uses illicit drugs (Marijuana).   Family History: family history includes Allergies in his mother; Asthma in his mother; Heart failure in his mother; and Other in his father.  There is no history of Anesthesia problems.    Physical Exam: Patient Vitals for the past 24 hrs:  BP Temp Temp src Pulse Resp SpO2  09/28/11 0316 105/72 mmHg - - 103  19  97 %  09/28/11 0100 136/94 mmHg - - 92  - 97 %   09/28/11 0045 128/80 mmHg - - 84  - 99 %  09/28/11 0030 134/94 mmHg - - 99  - 99 %  09/28/11 0015 97/52 mmHg - - 94  - 99 %  09/27/11 2356 103/69 mmHg - - 93  - 99 %  09/27/11 2330 103/69 mmHg - - 101  17  99 %  09/27/11 2323 103/70 mmHg 97.9 F (36.6 C) Oral 103  20  98 %  09/27/11 2310 90/60 mmHg - - 120  18  -    1. General:  in No Acute distress 2. Psychological: Alert and  Oriented 3. Head/ENT:    Dry Mucous Membranes                          Head Non traumatic, neck supple, corneal thickening and scarring noted                          Normal  Dentition 4. SKIN: decreased Skin turgor,  Skin clean Dry and intact no rash 5. Heart: Regular rate and rhythm no Murmur, Rub or gallop 6. Lungs: Clear to auscultation bilaterally, no wheezes or crackles   7. Abdomen: Soft, non-tender, Non distended 8. Lower extremities: no clubbing, cyanosis, or edema 9. Neurologically Grossly intact, moving all 4 extremities equally 10. MSK: Normal range of motion  body mass index is unknown because there is no height or weight on file.   Labs on Admission:   [facility] 09/27/11 2357  NA 139  K 3.8  CL 99  CO2 22  GLUCOSE 101*  BUN 38*  CREATININE 2.55*  CALCIUM 9.8  MG --  PHOS --    Basename 09/27/11 2357  AST 17  ALT 14  ALKPHOS 102  BILITOT 0.4  PROT 7.6  ALBUMIN 3.6    Basename 09/27/11 2357  LIPASE 153*  AMYLASE --    Basename 09/27/11 2357  WBC 14.5*  NEUTROABS 10.6*  HGB 13.7  HCT 39.1  MCV 90.1  PLT 388   No results found for this basename: CKTOTAL:3,CKMB:3,CKMBINDEX:3,TROPONINI:3 in the last 72 hours No results found for this basename: TSH,T4TOTAL,FREET3,T3FREE,THYROIDAB in the last 72 hours No results found for this basename: VITAMINB12:2,FOLATE:2,FERRITIN:2,TIBC:2,IRON:2,RETICCTPCT:2 in the last 72 hours No results found for this basename:  HGBA1C    The CrCl is unknown because both a height and weight (above a minimum accepted value) are required for this  calculation. ABG    Component Value Date/Time   TCO2 25 09/16/2011 0517     No results found for this basename: DDIMER     Other results:   Cultures:    Component Value Date/Time   SDES URINE, RANDOM 09/16/2011 0521   SPECREQUEST NONE 09/16/2011 0521   CULT NO GROWTH 09/16/2011 0521   REPTSTATUS 09/17/2011 FINAL 09/16/2011 0521       Radiological Exams on Admission: No results found.  Assessment/Plan  72 y w history of facial trauma, right forearm fracture and urinary retention, he had an elevated lipase nausea vomiting and acute renal failure  Present on Admission:  .Acute renal failure (ARF) - could be easily secondary to dehydration versus post renal secondary to obstruction. We'll evaluate the imaging obtained urine electrolytes. Hopefully will improve with IV hydration if not will need a renal consult  .Pancreatitis - elevated lipase is not equivalent to pancreatitis. We'll obtain imaging to evaluate pancreas.  lipase could be elevated incidentally patient had not been having epigastric pain  .Nausea and vomiting - could be related to acute renal failure versus vertigo  .Dehydration - we'll give IV fluids for   .Leucocytosis - no evidence of infection at this point could be hemoconcentration Right arm pain - will reimage to evaluate for osteomyelitis would also benefit from a consult from Dr. Lajoyce Corners in the morning  Prophylaxis: SCD  Protonix    I have spent a total of 55 min on this admission  [name] [name] Stage 09/28/2011, 4:18 AM

## 2011-09-28 NOTE — ED Notes (Signed)
Called and gave report to [place].

## 2011-09-28 NOTE — ED Notes (Signed)
Pt unable to void at this time pt is aware of needed void 

## 2011-09-29 DIAGNOSIS — N179 Acute kidney failure, unspecified: Secondary | ICD-10-CM

## 2011-09-29 DIAGNOSIS — M79609 Pain in unspecified limb: Secondary | ICD-10-CM

## 2011-09-29 DIAGNOSIS — R339 Retention of urine, unspecified: Secondary | ICD-10-CM

## 2011-09-29 LAB — CBC
Hemoglobin: 11.5 g/dL — ABNORMAL LOW (ref 13.0–17.0)
MCV: 93.1 fL (ref 78.0–100.0)
Platelets: 271 10*3/uL (ref 150–400)
RBC: 3.77 MIL/uL — ABNORMAL LOW (ref 4.22–5.81)
WBC: 9.8 10*3/uL (ref 4.0–10.5)

## 2011-09-29 LAB — PHOSPHORUS: Phosphorus: 3.9 mg/dL (ref 2.3–4.6)

## 2011-09-29 LAB — LIPID PANEL
LDL Cholesterol: 66 mg/dL (ref 0–99)
Triglycerides: 102 mg/dL (ref <150)
VLDL: 20 mg/dL (ref 0–40)

## 2011-09-29 LAB — BASIC METABOLIC PANEL
CO2: 21 mEq/L (ref 19–32)
Chloride: 109 mEq/L (ref 96–112)
Creatinine, Ser: 1.22 mg/dL (ref 0.50–1.35)

## 2011-09-29 LAB — MAGNESIUM: Magnesium: 1.8 mg/dL (ref 1.5–2.5)

## 2011-09-29 MED ORDER — SODIUM CHLORIDE 0.9 % IV SOLN
INTRAVENOUS | Status: DC
  Administered 2011-09-29: 13:00:00 via INTRAVENOUS
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
## 2011-09-29 NOTE — Progress Notes (Signed)
Utilization review completed.  

## 2011-09-29 NOTE — Progress Notes (Signed)
Subjective: Tolerating food, no nausea.  Objective: Vital signs in last 24 hours: Temp:  [97 F (36.1 C)-97.5 F (36.4 C)] 97 F (36.1 C) (05/13 0606) Pulse Rate:  [85-98] 94  (05/13 0606) Resp:  [20] 20  (05/13 0606) BP: (105-128)/(60-78) 128/78 mmHg (05/13 0606) SpO2:  [92 %-97 %] 95 % (05/13 0845) Weight change:  Last BM Date: 09/27/11  Intake/Output from previous day: 05/12 0701 - 05/13 0700 In: 2144 [P.O.:480; I.V.:1664] Out: 1800 [Urine:1800]     Physical Exam: General: Comfortable, alert, communicative, fully oriented, not short of breath at rest.  HEENT:  No clinical pallor, no jaundice, no conjunctival injection. Visible buccal mucosa, is still somewhat "dry". NECK:  Supple, JVP not seen, no carotid bruits, no palpable lymphadenopathy, no palpable goiter. CHEST:  Clinically clear to auscultation, no wheezes, no crackles. HEART:  Sounds 1 and 2 heard, normal, regular, no murmurs. ABDOMEN:  Full, soft, non-tender, no palpable organomegaly, no palpable masses, normal bowel sounds. GENITALIA:  Not examined. LOWER EXTREMITIES:  No pitting edema, palpable peripheral pulses. MUSCULOSKELETAL SYSTEM:  Right forearm now in velcro splint, otherwise, unremarkable.. CENTRAL NERVOUS SYSTEM:  No focal neurologic deficit on gross examination.  Lab Results:  Basename 09/29/11 0710 09/28/11 0825  WBC 9.8 10.6*  HGB 11.5* 11.5*  HCT 35.1* 34.0*  PLT 271 305    Basename 09/29/11 0710 09/28/11 0825  NA 140 137  K 4.0 3.4*  CL 109 103  CO2 21 21  GLUCOSE 90 87  BUN 16 31*  CREATININE 1.22 1.94*  CALCIUM 8.6 8.4   No results found for this or any previous visit (from the past 240 hour(s)).   Studies/Results: Ct Abdomen Pelvis Wo Contrast  09/28/2011  *RADIOLOGY REPORT*  Clinical Data: Evaluate for pancreatitis.  Abdominal pain.  Cloudy urine.  CT ABDOMEN AND PELVIS WITHOUT CONTRAST  Technique:  Multidetector CT imaging of the abdomen and pelvis was performed following the  standard protocol without intravenous contrast.  Comparison: CT 08/19/2011  Findings: Lung bases are clear.  No pericardial fluid.  No focal hepatic lesion on this noncontrast exam.  The gallbladder, pancreas, spleen, adrenal glands are normal.  There is no evidence of peripancreatic inflammation.  Kidneys appear normal as noncontrast exam.  There are nonobstructing small calculi within the left and right kidney.  No ureterolithiasis.  The stomach, small bowel, and cecum are normal.  The appendix is not identified.  Colon and rectosigmoid colon are normal.  Abdominal aorta normal caliber.  No retroperitoneal lymphadenopathy.  No free fluid the pelvis.  Prostate gland is normal.  Foley catheter within the bladder.  Gas in the bladder presumably related to catheterization. Review of  bone windows demonstrates no aggressive osseous lesions.  There is a compression fracture at L1 which is unchanged from prior.  IMPRESSION:  1.  No evidence of acute pancreatitis. 2.  Bilateral nonobstructing renal calculi. 3.  Gas within the bladder presumably related to catheterization.  Original Report Authenticated By: Genevive Bi, M.D.   Dg Forearm Right  09/28/2011  *RADIOLOGY REPORT*  Clinical Data: Right wrist swelling and pain.  RIGHT FOREARM - 2 VIEW  Comparison: 08/20/2011  Findings: Interval removal of cast material and placement of plate screw fixation across a healing fracture of the distal right radial metaphysis.  Residual fracture deformity.  Displaced fracture fragments.  Callous formation consistent with healing.  Dorsal soft tissue swelling.  No acute fracture or bone erosion.  One of the distal screw heads is not flushed with  the plate.  Screw tips appear to extend close to the distal radial surface.  IMPRESSION: Plate and screw fixation of comminuted fractures of the distal right radius.  No acute fracture.  Original Report Authenticated By: Marlon Pel, M.D.    Medications: Scheduled Meds:    .  ARIPiprazole  2 mg Oral Daily  . diazepam  10 mg Oral TID  . docusate sodium  100 mg Oral BID  . fluticasone  1 spray Each Nare Daily  . Fluticasone-Salmeterol  1 puff Inhalation Q12H  . montelukast  10 mg Oral QHS  . pantoprazole  40 mg Oral Q1200  . prednisoLONE acetate  1 drop Both Eyes TID  . sertraline  200 mg Oral Daily  . Tamsulosin HCl  0.4 mg Oral Daily   Continuous Infusions:    . sodium chloride 1,000 mL (09/28/11 0123)  . sodium chloride 100 mL/hr (09/29/11 0910)   PRN Meds:.acetaminophen, acetaminophen, albuterol, albuterol, albuterol, guaiFENesin-dextromethorphan, HYDROmorphone (DILAUDID) injection, hydrOXYzine, ondansetron (ZOFRAN) IV, ondansetron, oxyCODONE-acetaminophen, traZODone, DISCONTD:  HYDROmorphone (DILAUDID) injection  Assessment/Plan: Present on Admission:  1. Dehydration/Acute renal failure (ARF): Patient presented with two days of vomiting, ie, 09/23/11-09/24/11, followed by dry heaves, and no further vomiting, but had not eaten or drank much, since. His BUN was 38 and creatinine 2.55 at presentation, consistent with dehydration and pre-renal AKI, against a background of known baseline creatinine on 0.99 on 09/16/11. Fortunately, CT abdomen/pelvis of 09/28/11, showed bilateral nonobstructing renal calculi, but no evidence of hydronephrosis. We are managing with iv fluids and improvement has been dramatic, with BUN of 176 and creatinine of 1.22 on 09/29/11. ARF has resolved. We shall discontinue iv fluids on 09/30/11, AM.. 2. Acute gastritis: Patient's presentation is consistent clinically, with acute gastritis, although etiology is unclear. Although Lipase was mildly elevated at 153, it is doubtful that this is acute pancreatitis, as patient has no risk factors, and CT of the pancreas is normal. Be that as it may, Lipase is 57 today. We are managing with antiemetics, PPI and iv fluids, and he has remained asymptomatic, since admission..  3. Leucocytosis: Suspect etiology  is margination/hemoconcentration, due to above conditions. No evidence of infection is present at this time, clinically. WCC has normalized at 9.8, on 09/29/11. 4. Right arm pain: This is at the site of ORIF right distal radial fracture, 09/12/11. X-ray of 09/28/11, showed plate and screw fixation of comminuted fractures of the distal right radius. No acute fracture. Dr Vira Browns provided orthopedic consultation, and patient was subsequently seen by Dr Aldean Baker. It appear that there is backing out of one screw. Sutures were removed on 09/28/11, Velcro splint was applied to right forearm, and recommendation ids for HHN/OT on discharge, with orthopedic office follow up.  Comment: Aiming discharge on 09/30/11. Meanwhile, ambulate.     LOS: 2 days   Romari Gasparro,CHRISTOPHER 09/29/2011, 10:27 AM

## 2011-09-29 NOTE — Progress Notes (Signed)
Patient ID: [name] [name], male   DOB: [birth year], 61 y.o.   MRN: [id number] C/o global wrist and forearm pain right arm, s/p ORIF wrist, radiographs show backing out of one screw, this should not be causing his pain, he's moving his fingers better, I'll f/u in office

## 2011-09-29 NOTE — Clinical Social Work Psychosocial (Signed)
     Clinical Social Work Department BRIEF PSYCHOSOCIAL ASSESSMENT 09/29/2011  Patient:  [name] [name], [name] [name]     Account Number:  [id number]     Admit date:  09/27/2011  Clinical Social Worker:  Dennison Bulla  Date/Time:  09/29/2011 02:45 PM  Referred by:  Physician  Date Referred:  09/29/2011 Referred for  ALF Placement   Other Referral:   Interview type:  Patient Other interview type:    PSYCHOSOCIAL DATA Living Status:  ALONE Admitted from facility:   Level of care:   Primary support name:  Craige Cotta Primary support relationship to patient:  FRIEND Degree of support available:   Adequate    CURRENT CONCERNS Current Concerns  Post-Acute Placement   Other Concerns:    SOCIAL WORK ASSESSMENT / PLAN CSW met with patient at bedside. CSW is familiar with patient from previous admission. No visitors present. CSW spoke with patient regarding admission to hospital. CSW spoke with patient regarding his ability to safely manage his needs at home. Patient reports that ex-partner should be moving in soon. CSW and patient once again discussed patient's safety of ex-partner moving in and if it was a good decision. Patient continues to report that he "will make it work". Patient reports that he has considered ALF since last admission but is not ready to "give up all my money and freedom" to go to ALF. Patient states if he changes his mind then DSS worker will assist with placement. Patient reports he will return home with HH at dc. CSW assisted with transportation home at previous discharge and can assist if needed.   Assessment/plan status:  No Further Intervention Required Other assessment/ plan:   Information/referral to community resources:   ALF information    PATIENTS/FAMILYS RESPONSE TO PLAN OF CARE: Patient was alert and oriented. Patient reports he has been managing well at home. Patient not agreeable to placement at this time. Patient has supports to assist with needs at dc.

## 2011-09-29 NOTE — Progress Notes (Signed)
OT EVALUATION: Past Medical History  Diagnosis Date  . 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 Cough   . Esophageal reflux   . Osteopenia   . Personal history of other diseases of digestive system   . Contact dermatitis and other eczema, due to unspecified cause   . Unspecified sinusitis (chronic)   . Allergic rhinitis   . Unspecified asthma   . COPD (chronic obstructive pulmonary disease)   . Hypertension   . Seasonal allergies   . Ulcerative colitis   . H/O hiatal hernia   . Arthritis     spine  . Anxiety   . Depression   . Multiple open wounds     "from esczema- scratching"  . Multiple facial bone fractures   . 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 History of bronchitis   . Pneumonia     in past had several times  . Shortness of breath     "sometime"  . Chronic lower back pain   . Kidney stones    Past Surgical History  Procedure Date  . Litrotripsy   . Orif distal radius fracture 09/12/11    right  . Fracture surgery   . Metacarpal pinning     right hand  . Patella fracture surgery     right  . Tonsillectomy and adenoidectomy   . Eye surgery     3 Corneal transplant (bilateral eyes)     09/29/11 1100  OT Visit Information  Last OT Received On 09/29/11  Assistance Needed +1  OT Time Calculation  OT Start Time 1109  OT Stop Time 1201  OT Time Calculation (min) 52 min  Subjective Data  Subjective I need to get moving  Patient Stated Goal Get my rt. arm moving  Precautions  Precautions None  Restrictions  Weight Bearing Restrictions Yes  RUE Weight Bearing NWB  Home Living  Lives With Alone  Available Help at Discharge Friend(s)  Type of Home House  Home Access Stairs to enter  Entrance Stairs-Number of Steps 2  Home Layout One level  Bathroom Nurse, children's Yes  How Accessible Accessible via walker  Home Research officer, trade union chair with  back;Other (comment)  Additional Comments Ex-partner is coming to live with him when he returns home.  Prior Function  Level of Independence Independent  Able to Take Stairs? Yes  Driving No  Vocation Unemployed  Communication  Communication No difficulties  ADL  Eating/Feeding Performed;Set up  Where Assessed - Eating/Feeding Bed level  Grooming Performed;Shaving;Set up  Where Assessed - Grooming Supported sitting  Upper Body Bathing Simulated;Minimal assistance  Where Assessed - Upper Body Bathing Sitting, bed  Lower Body Bathing Simulated;Set up  Where Assessed - Lower Body Bathing Sitting, bed  Upper Body Dressing Performed;Minimal assistance (with donning gown)  Where Assessed - Upper Body Dressing Sitting, bed  Lower Body Dressing Performed;Set up  Where Assessed - Lower Body Dressing Sitting, bed (Min verbal cues for sock opening due to vision deficit)  Toilet Transfer Simulated;Minimal assistance  Toilet Transfer Method Engineer, drilling Other (comment) (recliner)  Toileting - Clothing Manipulation Simulated;Minimal assistance  Where Assessed - Toileting Clothing Manipulation Sit to stand from 3-in-1 or toilet  Toileting - Hygiene Simulated;Modified independent  Where Assessed - Toileting Hygiene Sit on 3-in-1 or toilet  Tub/Shower Transfer Not assessed  Tub/Shower Transfer Method  Not assessed  Equipment Used Other (comment) (IV pole due to pt. uses walking stick at home )  Ambulation Related to ADLs HHA and IV pole use due to legally blind ~150'  ADL Comments Pt. educated on rt. hand AROM/PROM exercises to complete throughout the day to increase functional use of hand and decrease swelling. Pt. able to demonstrate and recall elevation techniques with use of pillows for positioning and able to complete retrograde massage techniques of digits. Pt. only requiring HHA for mobility and during ADL tasks due to pt. legally blind and in an unfamiliar  environment. Pt. with decreased ability to complete tasks at home such as light meal prep without increased difficulty and pt. reports just "not eating" since it would take too long to make a meal. Pt. does not have reliable supervision at home and will not be in a safe environment. Pt. reports not showering as well due to being alone and having a fear of falling. Pt. will benefit from 24hr supervision and assist or ST-SNF to increase overall functioning and safety.   Vision - History  Baseline Vision Legally blind  Cognition  Overall Cognitive Status Appears within functional limits for tasks assessed/performed  Arousal/Alertness Awake/alert  Orientation Level Appears intact for tasks assessed  Behavior During Session [facility] for tasks performed  Bed Mobility  Bed Mobility Supine to Sit;Sitting - Scoot to Edge of Bed  Supine to Sit 7: Independent  Sitting - Scoot to Edge of Bed 7: Independent  Sit to Supine 7: Independent  Transfers  Transfers Sit to Stand;Stand to Sit  Sit to Stand With upper extremity assist;From bed;5: Supervision  Stand to Sit With upper extremity assist;With armrests;To chair/3-in-1;5: Supervision  Details for Transfer Assistance Close supervision due to pt. reporting feeling dizzy sometimes when he stands  Hand Exercises  Digit Composite Flexion PROM;AROM;Right;10 reps;Supine  Composite Extension PROM;AROM;Right;10 reps;Supine  Digit Composite Abduction PROM;AROM;Right;10 reps;Supine  Digit Composite Adduction PROM;AROM;Right;10 reps;Supine  Digit Lifts AROM;PROM;Right;10 reps;Supine  Thumb Abduction AROM;PROM;Right;10 reps;Supine  Thumb Adduction PROM;AROM;Right;10 reps;Supine  Opposition PROM;Right;10 reps;Supine  OT - End of Session  Equipment Utilized During Treatment Gait belt  Activity Tolerance Patient tolerated treatment well  Patient left in chair;with call bell/phone within reach  Nurse Communication Mobility status  OT Assessment  Clinical Impression  Statement Pt. presents s/p ORIF rt. wrist and with increased pain. Pt. will benefit from skilled OT to increase functional independence and safety at home.  OT Recommendation/Assessment Patient needs continued OT Services  OT Problem List Decreased strength;Decreased range of motion;Decreased knowledge of use of DME or AE;Decreased knowledge of precautions;Impaired UE functional use;Pain  Barriers to Discharge Decreased caregiver support  Barriers to Discharge Comments Pt. living in an unsafe environment at home and would benefit from more supervision at home.  OT Therapy Diagnosis  Generalized weakness;Acute pain  OT Plan  OT Frequency Min 2X/week  OT Treatment/Interventions Self-care/ADL training;Therapeutic exercise;DME and/or AE instruction;Therapeutic activities;Patient/family education  OT Recommendation  Follow Up Recommendations Home health OT;Supervision/Assistance - 24 hour  Equipment Recommended None recommended by OT  Individuals Consulted  Consulted and Agree with Results and Recommendations Patient  Acute Rehab OT Goals  OT Goal Formulation With patient  Time For Goal Achievement 10/06/11  Potential to Achieve Goals Good  ADL Goals  Pt Will Perform Grooming with modified independence;Sitting, chair;with adaptive equipment  ADL Goal: Grooming - Progress Goal set today  Pt Will Perform Upper Body Bathing with modified independence;Sitting, chair  ADL Goal: Upper Body Bathing -  Progress Goal set today  Pt Will Perform Upper Body Dressing with modified independence;Sitting, chair  ADL Goal: Upper Body Dressing - Progress Goal set today  Pt Will Transfer to Toilet with modified independence;Ambulation;Regular height toilet  ADL Goal: Toilet Transfer - Progress Goal set today  Pt Will Perform Tub/Shower Transfer Tub transfer;with modified independence;Ambulation;Shower seat with back  ADL Goal: Tub/Shower Transfer - Progress Goal set today  Arm Goals  Pt Will Perform AROM  Independently;to maintain range of motion;to decrease contractures;Right upper extremity;10 reps;2 sets;Other (comment)  Arm Goal: AROM - Progress Goal set today  Additional Arm Goal #1 indep with positioning and edema control R hand.  Arm Goal: Additional Goal #1 - Progress Goal set today  Written Expression  Dominant Hand Right    Cassandria Anger, OTR/L Pager: [phone] 09/29/2011 .

## 2011-09-29 NOTE — Discharge Summary (Addendum)
Physician Discharge Summary  Patient ID: [name] [name] MRN: [id number] DOB/AGE: Dec 18, 1950 61 y.o.  Admit date: 09/27/2011 Discharge date: 09/30/2011  Primary Care Physician:  Gwynneth Aliment, MD, MD Primary orthopedic surgeon, Dr Peggye Ley.  Discharge Diagnoses:    Patient Active Problem List  Diagnoses  . BLINDNESS  . SINUSITIS, CHRONIC  . ALLERGIC RHINITIS  . ASTHMA, PERSISTENT, SEVERE  . Esophageal reflux  . ECZEMA  . OSTEOPENIA  . ULCERATIVE COLITIS, HX OF  . TACHYCARDIA  . 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 Multiple facial bone fractures  . Fracture of radius, distal, right, closed  . Facial contusion  . Urinary retention  . Nausea and vomiting  . UTI (lower urinary tract infection)  . Pancreatitis  . Dehydration  . Acute renal failure (ARF)  . Leucocytosis  . Right arm pain    Medication List  As of 09/30/2011  8:19 AM   STOP taking these medications         DSS 100 MG Caps         TAKE these medications         ADVAIR DISKUS 500-50 MCG/DOSE Aepb   Generic drug: Fluticasone-Salmeterol   Inhale 1 puff into the lungs every 12 (twelve) hours.      albuterol 108 (90 BASE) MCG/ACT inhaler   Commonly known as: PROVENTIL HFA;VENTOLIN HFA   Inhale 2 puffs into the lungs every 4 (four) hours as needed. For shortness of breath      albuterol (2.5 MG/3ML) 0.083% nebulizer solution   Commonly known as: PROVENTIL   Take 2.5 mg by nebulization every 4 (four) hours as needed. For shortness of breath      ARIPiprazole 2 MG tablet   Commonly known as: ABILIFY   Take 2 mg by mouth daily.      Calcium 600+D 600-400 MG-UNIT per tablet   Generic drug: Calcium Carbonate-Vitamin D   Take 1 tablet by mouth daily.      desoximetasone 0.05 % cream   Commonly known as: TOPICORT   Apply 1 application topically 3 (three) times daily as needed. For itching      diazepam 10 MG tablet   Commonly known as: VALIUM   Take by mouth 3 (three)  times daily.      hydrOXYzine 25 MG tablet   Commonly known as: ATARAX/VISTARIL   Take 25 mg by mouth 3 (three) times daily as needed. For itching      montelukast 10 MG tablet   Commonly known as: SINGULAIR   Take 10 mg by mouth at bedtime.      NASONEX 50 MCG/ACT nasal spray   Generic drug: mometasone   Place 2 sprays into the nose daily.      omeprazole 20 MG capsule   Commonly known as: PRILOSEC   Take 20 mg by mouth daily as needed. Acid reflux      oxyCODONE-acetaminophen 5-325 MG per tablet   Commonly known as: PERCOCET   Take 2 tablets by mouth every 4 (four) hours as needed for pain. For pain      prednisoLONE acetate 1 % ophthalmic suspension   Commonly known as: PRED FORTE   Place 1 drop into both eyes 3 (three) times daily.      sertraline 100 MG tablet   Commonly known as: ZOLOFT   Take 200 mg by mouth daily.      Tamsulosin HCl 0.4 MG Caps   Commonly known as: FLOMAX  Take 1 capsule (0.4 mg total) by mouth daily.      traZODone 100 MG tablet   Commonly known as: DESYREL   Take 100 mg by mouth at bedtime as needed. For insomnia             Disposition and Follow-up:  Follow up with primary MD, and with orthopedic surgeon.  Consults:  orthopedic surgery  Dr Vira Browns, orthopedic surgeon.  Significant Diagnostic Studies:  Ct Abdomen Pelvis Wo Contrast  09/28/2011  *RADIOLOGY REPORT*  Clinical Data: Evaluate for pancreatitis.  Abdominal pain.  Cloudy urine.  CT ABDOMEN AND PELVIS WITHOUT CONTRAST  Technique:  Multidetector CT imaging of the abdomen and pelvis was performed following the standard protocol without intravenous contrast.  Comparison: CT 08/19/2011  Findings: Lung bases are clear.  No pericardial fluid.  No focal hepatic lesion on this noncontrast exam.  The gallbladder, pancreas, spleen, adrenal glands are normal.  There is no evidence of peripancreatic inflammation.  Kidneys appear normal as noncontrast exam.  There are nonobstructing  small calculi within the left and right kidney.  No ureterolithiasis.  The stomach, small bowel, and cecum are normal.  The appendix is not identified.  Colon and rectosigmoid colon are normal.  Abdominal aorta normal caliber.  No retroperitoneal lymphadenopathy.  No free fluid the pelvis.  Prostate gland is normal.  Foley catheter within the bladder.  Gas in the bladder presumably related to catheterization. Review of  bone windows demonstrates no aggressive osseous lesions.  There is a compression fracture at L1 which is unchanged from prior.  IMPRESSION:  1.  No evidence of acute pancreatitis. 2.  Bilateral nonobstructing renal calculi. 3.  Gas within the bladder presumably related to catheterization.  Original Report Authenticated By: Genevive Bi, M.D.   Dg Forearm Right  09/28/2011  *RADIOLOGY REPORT*  Clinical Data: Right wrist swelling and pain.  RIGHT FOREARM - 2 VIEW  Comparison: 08/20/2011  Findings: Interval removal of cast material and placement of plate screw fixation across a healing fracture of the distal right radial metaphysis.  Residual fracture deformity.  Displaced fracture fragments.  Callous formation consistent with healing.  Dorsal soft tissue swelling.  No acute fracture or bone erosion.  One of the distal screw heads is not flushed with the plate.  Screw tips appear to extend close to the distal radial surface.  IMPRESSION: Plate and screw fixation of comminuted fractures of the distal right radius.  No acute fracture.  Original Report Authenticated By: Marlon Pel, M.D.    Brief H and P: For complete details, refer to admission H and P. However, in brief, this is a 61 year old male, with history of Legal blindness, GERD, Osteopenia, contact dermatitis, chronic sinusitis, allergic rhinitis, asthma, COPD, hypertension, Ulcerative colitis, hiatal hernia, arthritis, anxiety, depression, multiple facial bone fractures, Hep C w/o coma, chronic (early 2013), chronic back pain,  urolithiasis and ORIF right distal radial fracture, 09/12/11, presenting with, nausea, decreased oral  Intake and urine output. He has had and persistent/intractable right arm pain. He was admitted for further evaluation, investigation and management.  Physical Exam: On 09/30/11. General: Comfortable, alert, communicative, fully oriented, not short of breath at rest.  HEENT: No clinical pallor, no jaundice, no conjunctival injection. Hydration status is satisfactory.  NECK: Supple, JVP not seen, no carotid bruits, no palpable lymphadenopathy, no palpable goiter.  CHEST: Clinically clear to auscultation, no wheezes, no crackles.  HEART: Sounds 1 and 2 heard, normal, regular, no murmurs.  ABDOMEN:  Full, soft, non-tender, no palpable organomegaly, no palpable masses, normal bowel sounds.  GENITALIA: Not examined.  LOWER EXTREMITIES: No pitting edema, palpable peripheral pulses.  MUSCULOSKELETAL SYSTEM: Right forearm now in velcro splint, otherwise, unremarkable..  CENTRAL NERVOUS SYSTEM: No focal neurologic deficit on gross examination.   Hospital Course:  1. Dehydration/Acute renal failure (ARF): Patient presented with two days of vomiting, ie, 09/23/11-09/24/11, followed by dry heaves, and no further vomiting, but had not eaten or drank much, since. His BUN was 38 and creatinine 2.55 at presentation, consistent with dehydration and pre-renal AKI, against a background of known baseline creatinine on 0.99 on 09/16/11. Fortunately, CT abdomen/pelvis of 09/28/11, showed bilateral nonobstructing renal calculi, but no evidence of hydronephrosis. He was managed with iv fluids, and improvement has been dramatic, with BUN of 7 and creatinine of 0.88 on 09/29/11. ARF has resolved, and iv fluids were discontinued in AM of 09/30/11.  2. Acute gastritis: Patient's presentation is consistent clinically, with acute gastritis, although etiology is unclear. Although Lipase was mildly elevated at 153 at presentation, it is  doubtful that this is acute pancreatitis, as patient has no risk factors, and CT of the pancreas is normal. Be that as it may, Lipase was 57 on 09/29/11. Treatment with antiemetics, PPI resulted in resolution of symptoms, which did not recur during his hospitalization. He was able to tolerate a regular diet, from 09/28/11.  3. Leucocytosis: Suspect etiology is margination/hemoconcentration, due to above conditions. No evidence of infection was elicited clinically. Wcc normalized at 9.8, on 09/29/11 and was 7.6 on 09/30/11.  4. Right arm pain: This is at the site of ORIF right distal radial fracture, 09/12/11. X-ray of 09/28/11, showed plate and screw fixation of comminuted fractures of the distal right radius. No acute fracture. Dr Vira Browns provided orthopedic consultation, and patient was subsequently seen by Dr Aldean Baker. It appear that there is backing out of one screw. Sutures were removed on 09/28/11, Velcro splint was applied to right forearm, and recommendation is for HHN/OT on discharge, with orthopedic office follow up.   Comment: Stable for discharge on 09/30/11.   Time spent on Discharge: 35 mins.  Signed: Daniya Aramburo,CHRISTOPHER 09/30/2011, 8:19 AM

## 2011-09-30 DIAGNOSIS — M79609 Pain in unspecified limb: Secondary | ICD-10-CM

## 2011-09-30 DIAGNOSIS — N179 Acute kidney failure, unspecified: Secondary | ICD-10-CM

## 2011-09-30 DIAGNOSIS — R339 Retention of urine, unspecified: Secondary | ICD-10-CM

## 2011-09-30 LAB — CBC
HCT: 28.7 % — ABNORMAL LOW (ref 39.0–52.0)
Hemoglobin: 9.4 g/dL — ABNORMAL LOW (ref 13.0–17.0)
MCHC: 32.8 g/dL (ref 30.0–36.0)
RBC: 3.14 MIL/uL — ABNORMAL LOW (ref 4.22–5.81)
WBC: 7.6 10*3/uL (ref 4.0–10.5)

## 2011-09-30 LAB — BASIC METABOLIC PANEL
BUN: 7 mg/dL (ref 6–23)
CO2: 24 mEq/L (ref 19–32)
Chloride: 112 mEq/L (ref 96–112)
GFR calc non Af Amer: >90 mL/min (ref >90)
Glucose, Bld: 92 mg/dL (ref 70–99)
Potassium: 3.1 mEq/L — ABNORMAL LOW (ref 3.5–5.1)
Sodium: 141 mEq/L (ref 135–145)

## 2011-09-30 NOTE — Progress Notes (Addendum)
Comment:   Per RN, patient still unable to void, 6 hours post discontinuation of Foley catheter.   Plan: 1. Discharge deferred. 2. Reinsert Foley catheter. 3. Patient is already on Flomax. Will continue.  Will need urology consult on 10/01/11.  C. Jocee Kissick. MD.

## 2011-10-01 DIAGNOSIS — N179 Acute kidney failure, unspecified: Secondary | ICD-10-CM

## 2011-10-01 DIAGNOSIS — R339 Retention of urine, unspecified: Secondary | ICD-10-CM

## 2011-10-01 DIAGNOSIS — M79609 Pain in unspecified limb: Secondary | ICD-10-CM

## 2011-10-01 MED ORDER — OXYCODONE-ACETAMINOPHEN 5-325 MG PO TABS: 2.0000 | ORAL_TABLET | ORAL | Status: DC | PRN

## 2011-10-01 NOTE — Progress Notes (Signed)
Clinical Social Work  CSW received call from RN that patient was ready to dc. CSW coordinated transportation via [place]. CSW placed PTAR paperwork on shadow chart in [facility]. CSW is signing off.  [place], [place] [phone]

## 2011-10-01 NOTE — Progress Notes (Signed)
RN coming on shift was told by nurse tech that the patient was able to void.  Patient was able to void during the night.

## 2011-10-01 NOTE — Progress Notes (Signed)
Pt voiding spontaneously without difficulty since last PM- did not require Foley re-insertion as a result. Requesting prescription for percocet, stating he does not have enough at home. He denies any N/V, tolerating PO well. Chart reviewed.  Pt remained afebrile with vital signs stable overnight and on PE his lungs are clear, CVS-regular rate and rhythm, Extremities- R.hand &forearm with velcro splint, moving all fingers and sensation grossly intact, CNS- nonfocal.  A/P Transient Urinary retention- resolved as discussed above. Pt is to follow up with PCP. He is medically stable for d/c home at this time Please see the full discharge summary- no changes, per Dr Brien Few on 5/14 for the the rest of his medical problems and the details of his hospital stay.   Cherita Hebel C 10/01/2011, 12:38 PM

## 2011-10-04 ENCOUNTER — Encounter (HOSPITAL_COMMUNITY): Payer: Self-pay | Admitting: *Deleted

## 2011-10-04 ENCOUNTER — Emergency Department (HOSPITAL_COMMUNITY): Payer: Medicaid Other

## 2011-10-04 ENCOUNTER — Emergency Department (HOSPITAL_COMMUNITY)
Admission: EM | Admit: 2011-10-04 | Discharge: 2011-10-05 | Disposition: A | Discharge status: Home / Self Care | Payer: Medicaid Other | Attending: Emergency Medicine | Admitting: Emergency Medicine

## 2011-10-04 DIAGNOSIS — J449 Chronic obstructive pulmonary disease, unspecified: Secondary | ICD-10-CM | POA: Insufficient documentation

## 2011-10-04 DIAGNOSIS — G8918 Other acute postprocedural pain: Secondary | ICD-10-CM | POA: Insufficient documentation

## 2011-10-04 DIAGNOSIS — F341 Dysthymic disorder: Secondary | ICD-10-CM | POA: Insufficient documentation

## 2011-10-04 DIAGNOSIS — K219 Gastro-esophageal reflux disease without esophagitis: Secondary | ICD-10-CM | POA: Insufficient documentation

## 2011-10-04 DIAGNOSIS — G8929 Other chronic pain: Secondary | ICD-10-CM | POA: Insufficient documentation

## 2011-10-04 DIAGNOSIS — Z79899 Other long term (current) drug therapy: Secondary | ICD-10-CM | POA: Insufficient documentation

## 2011-10-04 DIAGNOSIS — I10 Essential (primary) hypertension: Secondary | ICD-10-CM | POA: Insufficient documentation

## 2011-10-04 DIAGNOSIS — J4489 Other specified chronic obstructive pulmonary disease: Secondary | ICD-10-CM | POA: Insufficient documentation

## 2011-10-04 LAB — POCT I-STAT, CHEM 8
Calcium, Ion: 1.22 mmol/L (ref 1.12–1.32)
Creatinine, Ser: 1.1 mg/dL (ref 0.50–1.35)
Glucose, Bld: 82 mg/dL (ref 70–99)
Hemoglobin: 11.9 g/dL — ABNORMAL LOW (ref 13.0–17.0)
Potassium: 4 mEq/L (ref 3.5–5.1)

## 2011-10-04 LAB — DIFFERENTIAL
Basophils Relative: 1 % (ref 0–1)
Eosinophils Absolute: 0.8 10*3/uL — ABNORMAL HIGH (ref 0.0–0.7)
Lymphs Abs: 1.8 10*3/uL (ref 0.7–4.0)
Monocytes Absolute: 0.6 10*3/uL (ref 0.1–1.0)
Monocytes Relative: 8 % (ref 3–12)
Neutrophils Relative %: 58 % (ref 43–77)

## 2011-10-04 LAB — CBC
HCT: 35.1 % — ABNORMAL LOW (ref 39.0–52.0)
Hemoglobin: 11.9 g/dL — ABNORMAL LOW (ref 13.0–17.0)
MCH: 30.8 pg (ref 26.0–34.0)
MCHC: 33.9 g/dL (ref 30.0–36.0)
MCV: 90.9 fL (ref 78.0–100.0)
RBC: 3.86 MIL/uL — ABNORMAL LOW (ref 4.22–5.81)

## 2011-10-04 MED ORDER — HYDROCORTISONE 0.5 % EX CREA
TOPICAL_CREAM | Freq: Once | CUTANEOUS | Status: AC
  Administered 2011-10-04: 23:00:00 via TOPICAL
  Filled 2011-10-04: qty 28.35

## 2011-10-04 MED ORDER — HYDROMORPHONE HCL PF 1 MG/ML IJ SOLN
1.0000 mg | Freq: Once | INTRAMUSCULAR | Status: AC
  Administered 2011-10-04: 1 mg via INTRAMUSCULAR
  Filled 2011-10-04: qty 1

## 2011-10-04 NOTE — ED Notes (Signed)
[id number]<AV> Expected date:<BR> Expected time: 9:08 PM<BR> Means of arrival:Ambulance<BR> Comments:<BR> M231 -- Arm Pain

## 2011-10-04 NOTE — ED Notes (Signed)
Per EMS:  2 weeks ago pt had surgery on his R arm to repair pins that had come out.  Today Pt st's he's feeling the same type of pain he did when the pins first fell out of place.  Rates pain 9/10.  Pt was admitted to [facility] 2 weeks ago and was on a Dilaudid drip.  Pt nad, no other complaints.

## 2011-10-05 MED ORDER — HYDROMORPHONE HCL PF 1 MG/ML IJ SOLN
1.0000 mg | Freq: Once | INTRAMUSCULAR | Status: AC
  Administered 2011-10-05: 1 mg via INTRAVENOUS
  Filled 2011-10-05: qty 1

## 2011-10-05 MED ORDER — HYDROMORPHONE HCL 2 MG PO TABS: 2.0000 mg | ORAL_TABLET | ORAL | Status: DC | PRN

## 2011-10-05 MED ORDER — HYDROMORPHONE HCL 2 MG PO TABS
2.0000 mg | ORAL_TABLET | Freq: Once | ORAL | Status: AC
  Administered 2011-10-05: 2 mg via ORAL
  Filled 2011-10-05: qty 1

## 2011-10-05 NOTE — ED Notes (Signed)
Called ptar 

## 2011-10-05 NOTE — Discharge Instructions (Signed)
Tonight your x-ray reveals no sign of infection.  Your blood work reveals no sign of infection.  Your pain.  Medicine.  Has been changed to a higher level.  Please make an appointment with Dr. Lajoyce Corners for further evaluation.

## 2011-10-05 NOTE — ED Provider Notes (Signed)
Medical screening examination/treatment/procedure(s) were performed by non-physician practitioner and as supervising physician I was immediately available for consultation/collaboration.   Shloka Baldridge A Dorma Altman, MD 10/05/11 0732 

## 2011-10-05 NOTE — ED Provider Notes (Addendum)
History     CSN: [id number]  Arrival date & time 10/04/11  2116   First MD Initiated Contact with Patient 10/04/11 2122      Chief Complaint  Patient presents with  . Arm Pain    (Consider location/radiation/quality/duration/timing/severity/associated sxs/prior treatment) HPI Comments: Postoperative pain in surgical site of R arm  The history is provided by the patient.    Past Medical History  Diagnosis Date  . 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 Cough   . Esophageal reflux   . Osteopenia   . Personal history of other diseases of digestive system   . Contact dermatitis and other eczema, due to unspecified cause   . Unspecified sinusitis (chronic)   . Allergic rhinitis   . Unspecified asthma   . COPD (chronic obstructive pulmonary disease)   . Hypertension   . Seasonal allergies   . Ulcerative colitis   . H/O hiatal hernia   . Arthritis     spine  . Anxiety   . Depression   . Multiple open wounds     "from esczema- scratching"  . Multiple facial bone fractures   . 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 History of bronchitis   . Pneumonia     in past had several times  . Shortness of breath     "sometime"  . Chronic lower back pain   . Kidney stones     Past Surgical History  Procedure Date  . Litrotripsy   . Orif distal radius fracture 09/12/11    right  . Fracture surgery   . Metacarpal pinning     right hand  . Patella fracture surgery     right  . Tonsillectomy and adenoidectomy   . Eye surgery     3 Corneal transplant (bilateral eyes)    Family History  Problem Relation Age of Onset  . Allergies Mother     atopic  . Asthma Mother   . Heart failure Mother     CHF  . Other Father   . Anesthesia problems Neg Hx     History  Substance Use Topics  . Smoking status: Former Smoker -- 1.0 packs/day for 3 years    Types: Cigarettes, Pipe    Quit date: 05/20/1983  . Smokeless tobacco: Never Used  .  Alcohol Use: Yes     "scotch once a year"      Review of Systems  Musculoskeletal: Positive for arthralgias. Negative for joint swelling.    Allergies  Bacitracin-polymyxin b; Bee venom; Codeine; Norco; Penicillins; Tape; and Chlorhexidine  Home Medications   Current Outpatient Rx  Name Route Sig Dispense Refill  . ALBUTEROL SULFATE HFA 108 (90 BASE) MCG/ACT IN AERS Inhalation Inhale 2 puffs into the lungs every 4 (four) hours as needed. For shortness of breath    . ALBUTEROL SULFATE (2.5 MG/3ML) 0.083% IN NEBU Nebulization Take 2.5 mg by nebulization every 4 (four) hours as needed. For shortness of breath    . ARIPIPRAZOLE 2 MG PO TABS Oral Take 2 mg by mouth daily.    Marland Kitchen CALCIUM CARBONATE-VITAMIN D 600-400 MG-UNIT PO TABS Oral Take 1 tablet by mouth daily.    . DESOXIMETASONE 0.05 % EX CREA Topical Apply 1 application topically 3 (three) times daily as needed. For itching on body    . DIAZEPAM 10 MG PO TABS Oral Take by mouth 3 (three) times daily.     Marland Kitchen  FLUTICASONE-SALMETEROL 500-50 MCG/DOSE IN AEPB Inhalation Inhale 1 puff into the lungs every 12 (twelve) hours.      Marland Kitchen HYDROXYZINE HCL 25 MG PO TABS Oral Take 25 mg by mouth 3 (three) times daily as needed. For itching    . IBUPROFEN 200 MG PO TABS Oral Take 800 mg by mouth every 6 (six) hours as needed. For pain    . MOMETASONE FUROATE 50 MCG/ACT NA SUSP Nasal Place 2 sprays into the nose daily.     Marland Kitchen MONTELUKAST SODIUM 10 MG PO TABS Oral Take 10 mg by mouth at bedtime.    . OMEPRAZOLE 20 MG PO CPDR Oral Take 20 mg by mouth daily as needed. Acid reflux    . PREDNISOLONE ACETATE 1 % OP SUSP Both Eyes Place 1 drop into both eyes 3 (three) times daily.    . SERTRALINE HCL 100 MG PO TABS Oral Take 200 mg by mouth daily.     Marland Kitchen TAMSULOSIN HCL 0.4 MG PO CAPS Oral Take 1 capsule (0.4 mg total) by mouth daily. 30 capsule 0  . HYDROMORPHONE HCL 2 MG PO TABS Oral Take 1 tablet (2 mg total) by mouth every 3 (three) hours as needed for pain. 15  tablet 0  . TRAZODONE HCL 100 MG PO TABS Oral Take 100 mg by mouth at bedtime as needed. For insomnia      BP 143/96  Pulse 94  Temp(Src) 97.6 F (36.4 C) (Oral)  Resp 15  SpO2 98%  Physical Exam  Constitutional: He appears well-developed.  Neck: Normal range of motion.  Cardiovascular: Normal rate.   Musculoskeletal: He exhibits tenderness. He exhibits no edema.  Skin: Skin is warm.    ED Course  Procedures (including critical care time)  Labs Reviewed  CBC - Abnormal; Notable for the following:    RBC 3.86 (*)    Hemoglobin 11.9 (*)    HCT 35.1 (*)    All other components within normal limits  DIFFERENTIAL - Abnormal; Notable for the following:    Eosinophils Relative 10 (*)    Eosinophils Absolute 0.8 (*)    All other components within normal limits  POCT I-STAT, CHEM 8 - Abnormal; Notable for the following:    Hemoglobin 11.9 (*)    HCT 35.0 (*)    All other components within normal limits   Dg Forearm Right  10/04/2011  *RADIOLOGY REPORT*  Clinical Data: Right distal forearm pain.  RIGHT FOREARM - 2 VIEW  Comparison: 09/28/2011  Findings: Volar plate and screw fixation of the distal radius fracture.  One of the distal screws appears to demonstrate mild volar migration in the interval.  Carpal osteopenia. Otherwise no interval change.  IMPRESSION: Plate and screw fixation of the distal radius fracture.  One of the distal screws appears to demonstrate mild volar migration in the interval.  Underlying infection cannot be excluded in the appropriate clinical setting.  Discussed via telephone with Sharen Hones at 10:45 p.m. on 10/04/2011.  Original Report Authenticated By: Waneta Martins, M.D.     1. Pain following surgery or procedure     I spoke with Dr. Magnus Ivan concerning patient's pain.  He has nothing new to add to his medication regime.  He recommends followup in the office with Dr. Lajoyce Corners to this week Will switch patient's home.  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

## 2011-10-09 ENCOUNTER — Encounter (HOSPITAL_COMMUNITY): Payer: Self-pay | Admitting: Vascular Surgery

## 2011-10-09 ENCOUNTER — Inpatient Hospital Stay (HOSPITAL_COMMUNITY)
Admission: AD | Admit: 2011-10-09 | Discharge: 2011-10-12 | DRG: 496 | Disposition: A | Discharge status: Home / Self Care | Payer: Medicaid Other | Source: Ambulatory Visit | Attending: Orthopedic Surgery | Admitting: Orthopedic Surgery

## 2011-10-09 ENCOUNTER — Ambulatory Visit: Payer: Medicaid Other | Admitting: Gastroenterology

## 2011-10-09 ENCOUNTER — Encounter (HOSPITAL_COMMUNITY)
Admission: AD | Disposition: A | Discharge status: Home / Self Care | Payer: Self-pay | Source: Ambulatory Visit | Attending: Orthopedic Surgery

## 2011-10-09 ENCOUNTER — Inpatient Hospital Stay (HOSPITAL_COMMUNITY): Payer: Medicaid Other | Admitting: Vascular Surgery

## 2011-10-09 ENCOUNTER — Other Ambulatory Visit (HOSPITAL_COMMUNITY): Payer: Self-pay | Admitting: Orthopedic Surgery

## 2011-10-09 ENCOUNTER — Encounter (HOSPITAL_COMMUNITY): Payer: Self-pay | Admitting: *Deleted

## 2011-10-09 DIAGNOSIS — K219 Gastro-esophageal reflux disease without esophagitis: Secondary | ICD-10-CM | POA: Diagnosis present

## 2011-10-09 DIAGNOSIS — T847XXA Infection and inflammatory reaction due to other internal orthopedic prosthetic devices, implants and grafts, initial encounter: Principal | ICD-10-CM | POA: Diagnosis present

## 2011-10-09 DIAGNOSIS — M949 Disorder of cartilage, unspecified: Secondary | ICD-10-CM | POA: Diagnosis present

## 2011-10-09 DIAGNOSIS — F329 Major depressive disorder, single episode, unspecified: Secondary | ICD-10-CM | POA: Diagnosis present

## 2011-10-09 DIAGNOSIS — Z87891 Personal history of nicotine dependence: Secondary | ICD-10-CM

## 2011-10-09 DIAGNOSIS — H548 Legal blindness, as defined in USA: Secondary | ICD-10-CM | POA: Diagnosis present

## 2011-10-09 DIAGNOSIS — J4489 Other specified chronic obstructive pulmonary disease: Secondary | ICD-10-CM | POA: Diagnosis present

## 2011-10-09 DIAGNOSIS — J449 Chronic obstructive pulmonary disease, unspecified: Secondary | ICD-10-CM | POA: Diagnosis present

## 2011-10-09 DIAGNOSIS — F3289 Other specified depressive episodes: Secondary | ICD-10-CM | POA: Diagnosis present

## 2011-10-09 DIAGNOSIS — B182 Chronic viral hepatitis C: Secondary | ICD-10-CM | POA: Diagnosis present

## 2011-10-09 DIAGNOSIS — I10 Essential (primary) hypertension: Secondary | ICD-10-CM | POA: Diagnosis present

## 2011-10-09 DIAGNOSIS — M899 Disorder of bone, unspecified: Secondary | ICD-10-CM | POA: Diagnosis present

## 2011-10-09 DIAGNOSIS — Z79899 Other long term (current) drug therapy: Secondary | ICD-10-CM

## 2011-10-09 DIAGNOSIS — F411 Generalized anxiety disorder: Secondary | ICD-10-CM | POA: Diagnosis present

## 2011-10-09 DIAGNOSIS — Y831 Surgical operation with implant of artificial internal device as the cause of abnormal reaction of the patient, or of later complication, without mention of misadventure at the time of the procedure: Secondary | ICD-10-CM | POA: Diagnosis present

## 2011-10-09 DIAGNOSIS — M009 Pyogenic arthritis, unspecified: Secondary | ICD-10-CM | POA: Diagnosis present

## 2011-10-09 HISTORY — PX: HARDWARE REMOVAL: SHX979

## 2011-10-09 LAB — COMPREHENSIVE METABOLIC PANEL
ALT: 19 U/L (ref 0–53)
Alkaline Phosphatase: 97 U/L (ref 39–117)
BUN: 20 mg/dL (ref 6–23)
Chloride: 102 mEq/L (ref 96–112)
GFR calc Af Amer: 67 mL/min — ABNORMAL LOW (ref >90)
Glucose, Bld: 74 mg/dL (ref 70–99)
Potassium: 3.9 mEq/L (ref 3.5–5.1)
Sodium: 137 mEq/L (ref 135–145)
Total Bilirubin: 0.2 mg/dL — ABNORMAL LOW (ref 0.3–1.2)
Total Protein: 7.6 g/dL (ref 6.0–8.3)

## 2011-10-09 LAB — CBC
HCT: 36.1 % — ABNORMAL LOW (ref 39.0–52.0)
Hemoglobin: 12.1 g/dL — ABNORMAL LOW (ref 13.0–17.0)
MCHC: 33.5 g/dL (ref 30.0–36.0)
RBC: 3.96 MIL/uL — ABNORMAL LOW (ref 4.22–5.81)
WBC: 9.1 10*3/uL (ref 4.0–10.5)

## 2011-10-09 LAB — PROTIME-INR: Prothrombin Time: 13.4 seconds (ref 11.6–15.2)

## 2011-10-09 LAB — APTT: aPTT: 31 seconds (ref 24–37)

## 2011-10-09 SURGERY — REMOVAL, HARDWARE
Anesthesia: General | Site: Wrist | Laterality: Right | Wound class: Dirty or Infected

## 2011-10-09 MED ORDER — GENTAMICIN SULFATE 40 MG/ML IJ SOLN
INTRAMUSCULAR | Status: DC | PRN
  Administered 2011-10-09: 160 mg via INTRAMUSCULAR

## 2011-10-09 MED ORDER — MUPIROCIN 2 % EX OINT
TOPICAL_OINTMENT | CUTANEOUS | Status: AC
  Filled 2011-10-09: qty 22

## 2011-10-09 MED ORDER — MIDAZOLAM HCL 5 MG/5ML IJ SOLN
INTRAMUSCULAR | Status: DC | PRN
  Administered 2011-10-09: 2 mg via INTRAVENOUS

## 2011-10-09 MED ORDER — ALBUTEROL SULFATE HFA 108 (90 BASE) MCG/ACT IN AERS
2.0000 | INHALATION_SPRAY | RESPIRATORY_TRACT | Status: DC | PRN
  Filled 2011-10-09: qty 6.7

## 2011-10-09 MED ORDER — ONDANSETRON HCL 4 MG/2ML IJ SOLN
INTRAMUSCULAR | Status: DC | PRN
  Administered 2011-10-09: 4 mg via INTRAVENOUS

## 2011-10-09 MED ORDER — METOCLOPRAMIDE HCL 10 MG PO TABS: 5.0000 mg | ORAL_TABLET | Freq: Three times a day (TID) | ORAL | Status: DC | PRN

## 2011-10-09 MED ORDER — CLINDAMYCIN PHOSPHATE 600 MG/4ML IJ SOLN
INTRAMUSCULAR | Status: AC
  Filled 2011-10-09: qty 4

## 2011-10-09 MED ORDER — FLUTICASONE-SALMETEROL 500-50 MCG/DOSE IN AEPB
1.0000 | INHALATION_SPRAY | Freq: Two times a day (BID) | RESPIRATORY_TRACT | Status: DC
  Administered 2011-10-10 – 2011-10-12 (×6): 1 via RESPIRATORY_TRACT
  Filled 2011-10-09: qty 14

## 2011-10-09 MED ORDER — FENTANYL CITRATE 0.05 MG/ML IJ SOLN
INTRAMUSCULAR | Status: DC | PRN
  Administered 2011-10-09: 100 ug via INTRAVENOUS
  Administered 2011-10-09: 50 ug via INTRAVENOUS

## 2011-10-09 MED ORDER — HYDROXYZINE HCL 25 MG PO TABS
25.0000 mg | ORAL_TABLET | ORAL | Status: DC
  Filled 2011-10-09: qty 1

## 2011-10-09 MED ORDER — BUPIVACAINE-EPINEPHRINE PF 0.5-1:200000 % IJ SOLN
INTRAMUSCULAR | Status: DC | PRN
  Administered 2011-10-09: 30 mL

## 2011-10-09 MED ORDER — HYDROMORPHONE HCL 2 MG PO TABS
2.0000 mg | ORAL_TABLET | ORAL | Status: DC | PRN
  Administered 2011-10-10 – 2011-10-11 (×5): 2 mg via ORAL
  Filled 2011-10-09 (×5): qty 1

## 2011-10-09 MED ORDER — DEXTROSE 5 % IV SOLN
INTRAVENOUS | Status: AC
  Filled 2011-10-09: qty 50

## 2011-10-09 MED ORDER — TRAZODONE HCL 100 MG PO TABS
100.0000 mg | ORAL_TABLET | Freq: Every evening | ORAL | Status: DC | PRN
  Filled 2011-10-09: qty 1

## 2011-10-09 MED ORDER — CLINDAMYCIN PHOSPHATE 600 MG/50ML IV SOLN
600.0000 mg | Freq: Four times a day (QID) | INTRAVENOUS | Status: AC
  Administered 2011-10-10 (×3): 600 mg via INTRAVENOUS
  Filled 2011-10-09 (×4): qty 50

## 2011-10-09 MED ORDER — CLINDAMYCIN PHOSPHATE 600 MG/50ML IV SOLN
600.0000 mg | INTRAVENOUS | Status: DC
  Filled 2011-10-09: qty 50

## 2011-10-09 MED ORDER — ARIPIPRAZOLE 2 MG PO TABS
2.0000 mg | ORAL_TABLET | Freq: Every day | ORAL | Status: DC
  Administered 2011-10-10 – 2011-10-12 (×4): 2 mg via ORAL
  Filled 2011-10-09 (×4): qty 1

## 2011-10-09 MED ORDER — SODIUM CHLORIDE 0.9 % IV SOLN: INTRAVENOUS | Status: DC

## 2011-10-09 MED ORDER — PREDNISOLONE ACETATE 1 % OP SUSP
1.0000 [drp] | Freq: Three times a day (TID) | OPHTHALMIC | Status: DC
  Administered 2011-10-11 – 2011-10-12 (×2): 1 [drp] via OPHTHALMIC
  Filled 2011-10-09 (×2): qty 1

## 2011-10-09 MED ORDER — DIAZEPAM 5 MG PO TABS
10.0000 mg | ORAL_TABLET | Freq: Three times a day (TID) | ORAL | Status: DC
  Administered 2011-10-10 – 2011-10-12 (×7): 10 mg via ORAL
  Filled 2011-10-09 (×8): qty 2

## 2011-10-09 MED ORDER — COUMADIN BOOK
Freq: Once | Status: AC
  Administered 2011-10-09: 22:00:00
  Filled 2011-10-09: qty 1

## 2011-10-09 MED ORDER — WARFARIN - PHARMACIST DOSING INPATIENT: Freq: Every day | Status: DC

## 2011-10-09 MED ORDER — MONTELUKAST SODIUM 10 MG PO TABS
10.0000 mg | ORAL_TABLET | Freq: Every day | ORAL | Status: DC
  Administered 2011-10-10 – 2011-10-11 (×3): 10 mg via ORAL
  Filled 2011-10-09 (×4): qty 1

## 2011-10-09 MED ORDER — CLINDAMYCIN PHOSPHATE 600 MG/50ML IV SOLN
INTRAVENOUS | Status: DC | PRN
  Administered 2011-10-09: 600 mg via INTRAVENOUS

## 2011-10-09 MED ORDER — TAMSULOSIN HCL 0.4 MG PO CAPS
0.4000 mg | ORAL_CAPSULE | Freq: Every day | ORAL | Status: DC
  Administered 2011-10-10 – 2011-10-12 (×4): 0.4 mg via ORAL
  Filled 2011-10-09 (×4): qty 1

## 2011-10-09 MED ORDER — ONDANSETRON HCL 4 MG/2ML IJ SOLN: 4.0000 mg | Freq: Four times a day (QID) | INTRAMUSCULAR | Status: DC | PRN

## 2011-10-09 MED ORDER — 0.9 % SODIUM CHLORIDE (POUR BTL) OPTIME
TOPICAL | Status: DC | PRN
  Administered 2011-10-09: 1000 mL

## 2011-10-09 MED ORDER — METOCLOPRAMIDE HCL 5 MG/ML IJ SOLN: 5.0000 mg | Freq: Three times a day (TID) | INTRAMUSCULAR | Status: DC | PRN

## 2011-10-09 MED ORDER — SERTRALINE HCL 100 MG PO TABS
200.0000 mg | ORAL_TABLET | Freq: Every day | ORAL | Status: DC
  Administered 2011-10-10 – 2011-10-12 (×4): 200 mg via ORAL
  Filled 2011-10-09 (×4): qty 2

## 2011-10-09 MED ORDER — OXYCODONE-ACETAMINOPHEN 5-325 MG PO TABS
ORAL_TABLET | ORAL | Status: AC
  Administered 2011-10-09: 1
  Filled 2011-10-09: qty 1

## 2011-10-09 MED ORDER — VANCOMYCIN HCL 500 MG IV SOLR
500.0000 mg | INTRAVENOUS | Status: AC
  Administered 2011-10-09: 500 mg
  Filled 2011-10-09: qty 500

## 2011-10-09 MED ORDER — HYDROMORPHONE HCL PF 1 MG/ML IJ SOLN
0.5000 mg | INTRAMUSCULAR | Status: DC | PRN
  Administered 2011-10-10: 1 mg via INTRAVENOUS
  Filled 2011-10-09: qty 1

## 2011-10-09 MED ORDER — OXYCODONE HCL 5 MG PO TABS
5.0000 mg | ORAL_TABLET | ORAL | Status: DC | PRN
  Administered 2011-10-10: 08:00:00 via ORAL
  Administered 2011-10-10 – 2011-10-12 (×7): 10 mg via ORAL
  Filled 2011-10-09 (×8): qty 2

## 2011-10-09 MED ORDER — LIDOCAINE HCL (CARDIAC) 20 MG/ML IV SOLN
INTRAVENOUS | Status: DC | PRN
  Administered 2011-10-09: 40 mg via INTRAVENOUS

## 2011-10-09 MED ORDER — PROPOFOL 10 MG/ML IV EMUL
INTRAVENOUS | Status: DC | PRN
  Administered 2011-10-09: 160 mg via INTRAVENOUS

## 2011-10-09 MED ORDER — EPHEDRINE SULFATE 50 MG/ML IJ SOLN
INTRAMUSCULAR | Status: DC | PRN
  Administered 2011-10-09 (×2): 5 mg via INTRAVENOUS

## 2011-10-09 MED ORDER — PANTOPRAZOLE SODIUM 40 MG PO TBEC
40.0000 mg | DELAYED_RELEASE_TABLET | Freq: Every day | ORAL | Status: DC
  Administered 2011-10-10 – 2011-10-11 (×3): 40 mg via ORAL
  Filled 2011-10-09 (×3): qty 1

## 2011-10-09 MED ORDER — WARFARIN VIDEO: Freq: Once | Status: DC

## 2011-10-09 MED ORDER — BUPIVACAINE HCL (PF) 0.5 % IJ SOLN
INTRAMUSCULAR | Status: DC | PRN
  Administered 2011-10-09: 10 mL

## 2011-10-09 MED ORDER — WARFARIN SODIUM 7.5 MG PO TABS
7.5000 mg | ORAL_TABLET | Freq: Once | ORAL | Status: AC
  Administered 2011-10-10: 7.5 mg via ORAL
  Filled 2011-10-09: qty 1

## 2011-10-09 MED ORDER — HYDROXYZINE HCL 25 MG PO TABS
25.0000 mg | ORAL_TABLET | Freq: Three times a day (TID) | ORAL | Status: DC | PRN
  Administered 2011-10-09 – 2011-10-11 (×4): 25 mg via ORAL
  Filled 2011-10-09 (×5): qty 1

## 2011-10-09 MED ORDER — ONDANSETRON HCL 4 MG PO TABS: 4.0000 mg | ORAL_TABLET | Freq: Four times a day (QID) | ORAL | Status: DC | PRN

## 2011-10-09 MED ORDER — POLYVINYL ALCOHOL 1.4 % OP SOLN
1.0000 [drp] | OPHTHALMIC | Status: DC | PRN
  Administered 2011-10-10: 2 [drp] via OPHTHALMIC
  Filled 2011-10-09: qty 15

## 2011-10-09 MED ORDER — FLUTICASONE PROPIONATE 50 MCG/ACT NA SUSP
1.0000 | Freq: Every day | NASAL | Status: DC
  Administered 2011-10-10 – 2011-10-12 (×3): 1 via NASAL
  Filled 2011-10-09: qty 16

## 2011-10-09 MED ORDER — TRIAMCINOLONE ACETONIDE 0.025 % EX CREA
TOPICAL_CREAM | Freq: Two times a day (BID) | CUTANEOUS | Status: DC | PRN
  Filled 2011-10-09: qty 15

## 2011-10-09 MED ORDER — PHENYLEPHRINE HCL 10 MG/ML IJ SOLN
INTRAMUSCULAR | Status: DC | PRN
  Administered 2011-10-09 (×2): 40 ug via INTRAVENOUS
  Administered 2011-10-09 (×2): 80 ug via INTRAVENOUS

## 2011-10-09 MED ORDER — LACTATED RINGERS IV SOLN
INTRAVENOUS | Status: DC | PRN
  Administered 2011-10-09 (×2): via INTRAVENOUS

## 2011-10-09 SURGICAL SUPPLY — 43 items
BANDAGE ELASTIC 4 VELCRO ST LF (GAUZE/BANDAGES/DRESSINGS) IMPLANT
BANDAGE ELASTIC 6 VELCRO ST LF (GAUZE/BANDAGES/DRESSINGS) IMPLANT
BANDAGE ESMARK 6X9 LF (GAUZE/BANDAGES/DRESSINGS) IMPLANT
BANDAGE GAUZE ELAST BULKY 4 IN (GAUZE/BANDAGES/DRESSINGS) ×2 IMPLANT
BNDG COHESIVE 4X5 TAN STRL (GAUZE/BANDAGES/DRESSINGS) IMPLANT
BNDG ESMARK 4X9 LF (GAUZE/BANDAGES/DRESSINGS) ×2 IMPLANT
BNDG ESMARK 6X9 LF (GAUZE/BANDAGES/DRESSINGS)
CLOTH BEACON ORANGE TIMEOUT ST (SAFETY) ×2 IMPLANT
COVER SURGICAL LIGHT HANDLE (MISCELLANEOUS) ×2 IMPLANT
CUFF TOURNIQUET SINGLE 34IN LL (TOURNIQUET CUFF) IMPLANT
CUFF TOURNIQUET SINGLE 44IN (TOURNIQUET CUFF) IMPLANT
DRAPE C-ARM 42X72 X-RAY (DRAPES) IMPLANT
DRAPE INCISE IOBAN 66X45 STRL (DRAPES) IMPLANT
DRAPE ORTHO SPLIT 77X108 STRL (DRAPES)
DRAPE SURG ORHT 6 SPLT 77X108 (DRAPES) IMPLANT
DRSG EMULSION OIL 3X3 NADH (GAUZE/BANDAGES/DRESSINGS) ×2 IMPLANT
DRSG PAD ABDOMINAL 8X10 ST (GAUZE/BANDAGES/DRESSINGS) ×2 IMPLANT
ELECT REM PT RETURN 9FT ADLT (ELECTROSURGICAL) ×2
ELECTRODE REM PT RTRN 9FT ADLT (ELECTROSURGICAL) ×1 IMPLANT
GLOVE BIOGEL PI IND STRL 9 (GLOVE) ×1 IMPLANT
GLOVE BIOGEL PI INDICATOR 9 (GLOVE) ×1
GLOVE SURG ORTHO 9.0 STRL STRW (GLOVE) ×2 IMPLANT
GOWN STRL NON-REIN LRG LVL3 (GOWN DISPOSABLE) ×4 IMPLANT
GOWN STRL REIN XL XLG (GOWN DISPOSABLE) ×2 IMPLANT
KIT BASIN OR (CUSTOM PROCEDURE TRAY) ×2 IMPLANT
KIT ROOM TURNOVER OR (KITS) ×2 IMPLANT
MANIFOLD NEPTUNE II (INSTRUMENTS) ×2 IMPLANT
NS IRRIG 1000ML POUR BTL (IV SOLUTION) ×2 IMPLANT
PACK ORTHO EXTREMITY (CUSTOM PROCEDURE TRAY) ×2 IMPLANT
PAD ARMBOARD 7.5X6 YLW CONV (MISCELLANEOUS) ×4 IMPLANT
PAD CAST 4YDX4 CTTN HI CHSV (CAST SUPPLIES) ×1 IMPLANT
PADDING CAST COTTON 4X4 STRL (CAST SUPPLIES) ×1
SPONGE GAUZE 4X4 12PLY (GAUZE/BANDAGES/DRESSINGS) ×2 IMPLANT
STAPLER VISISTAT 35W (STAPLE) IMPLANT
STOCKINETTE IMPERVIOUS 9X36 MD (GAUZE/BANDAGES/DRESSINGS) IMPLANT
SUT ETHILON 2 0 PSLX (SUTURE) ×2 IMPLANT
SUT VIC AB 0 CT1 27 (SUTURE)
SUT VIC AB 0 CT1 27XBRD ANBCTR (SUTURE) IMPLANT
SUT VIC AB 2-0 CT1 27 (SUTURE)
SUT VIC AB 2-0 CT1 TAPERPNT 27 (SUTURE) IMPLANT
TOWEL OR 17X24 6PK STRL BLUE (TOWEL DISPOSABLE) ×2 IMPLANT
TOWEL OR 17X26 10 PK STRL BLUE (TOWEL DISPOSABLE) ×2 IMPLANT
WATER STERILE IRR 1000ML POUR (IV SOLUTION) IMPLANT

## 2011-10-09 NOTE — Transfer of Care (Signed)
Immediate Anesthesia Transfer of Care Note  Patient: [name] [name]  Procedure(s) Performed: Procedure(s) (LRB): HARDWARE REMOVAL (Right)  Patient Location: PACU  Anesthesia Type: General  Level of Consciousness: awake, alert  and oriented  Airway & Oxygen Therapy: Patient Spontanous Breathing and Patient connected to nasal cannula oxygen  Post-op Assessment: Report given to PACU RN and Post -op Vital signs reviewed and stable  Post vital signs: Reviewed and stable  Complications: No apparent anesthesia complications

## 2011-10-09 NOTE — Anesthesia Procedure Notes (Addendum)
Anesthesia Regional Block:  Supraclavicular block  Pre-Anesthetic Checklist: ,, timeout performed, Correct Patient, Correct Site, Correct Laterality, Correct Procedure, Correct Position, site marked, Risks and benefits discussed, pre-op evaluation, post-op pain management  Laterality: Right  Prep: Maximum Sterile Barrier Precautions used and chloraprep       Needles:  Injection technique: Single-shot  Needle Type: Echogenic Stimulator Needle      Needle Gauge: 22 and 22 G    Additional Needles:  Procedures: ultrasound guided and nerve stimulator Supraclavicular block Narrative:  Start time: 10/09/2011 6:35 PM End time: 10/09/2011 6:50 PM Injection made incrementally with aspirations every 5 mL. Anesthesiologist: Fitzgerald,MD  Additional Notes: 2% Lidocaine skin wheel. Intercostobrachial block with 5cc of 0.5% Bupivicaine plain.  Supraclavicular block Procedure Name: LMA Insertion Date/Time: 10/09/2011 7:03 PM Performed by: Julianne Rice Z Pre-anesthesia Checklist: Patient identified, Timeout performed, Emergency Drugs available, Suction available and Patient being monitored Patient Re-evaluated:Patient Re-evaluated prior to inductionOxygen Delivery Method: Circle system utilized Preoxygenation: Pre-oxygenation with 100% oxygen Intubation Type: IV induction Ventilation: Mask ventilation without difficulty LMA: LMA inserted LMA Size: 4.0 Tube type: Oral Number of attempts: 1 Tube secured with: Tape

## 2011-10-09 NOTE — Preoperative (Signed)
Beta Blockers   Reason not to administer Beta Blockers:Not Applicable 

## 2011-10-09 NOTE — Progress Notes (Signed)
Care of pt assumed by MA Maximos Zayas RN 

## 2011-10-09 NOTE — Op Note (Signed)
OPERATIVE REPORT  DATE OF SURGERY: 10/09/2011  PATIENT:  [name] [name],  61 y.o. male  PRE-OPERATIVE DIAGNOSIS:  Right Wrist Infection  POST-OPERATIVE DIAGNOSIS:  Right Wrist Infection  PROCEDURE:  Procedure(s): HARDWARE REMOVAL Irrigation and debridement of skin soft tissue muscle and bone for deep infection from retained hardware. Placement of antibiotic beads with 500 mg vancomycin and 160 mg gentamicin  SURGEON:  Surgeon(s): Nadara Mustard, MD  ANESTHESIA:   regional and general  EBL:  Minimal ML  SPECIMEN:  No Specimen  TOURNIQUET:  * No tourniquets in log *  PROCEDURE DETAILS: Patient is a 61 year old gentleman status post open reduction internal fixation for a malunion of a right distal radius fracture. Patient has had progressive pain recently presented to the office at this time with a purulent draining abscess from the retained hardware. Patient was brought urgently for irrigation and debridement removal of deep retained hardware and placement of antibiotic beads. Patient's past history significant for history of staph infections. Risks and benefits were discussed including persistent infection neurovascular injury need for additional surgery. Patient states he understands was pursued this time. Description of procedure patient was brought to OR room 1 after undergoing a regional block he then underwent a general anesthetic. After adequate levels of anesthesia were obtained patient's right upper extremity was prepped using DuraPrep and draped into a sterile field. The previous surgical incision was used there was abscess which had a draining sinus tract and this was cultured x2. The dissection was carried down to the quadratus. The quadratus was incised off the radius and the plate was identified. The screws were removed the plate was removed. The bone skin soft tissue and muscle was debrided and irrigated with normal saline. Curettes and a periosteal elevator were used to  excisionally debrided fibrinous tissue. The wound was further irrigated with normal saline. Antibiotic beads were mixed on the back table and were placed deep within the wound. The incision was closed using 2-0 nylon with a far near near far suture technique. Was covered with Adaptic orthopedic sponges AB dressing Kerlix and Coban. Patient was extubated taken to the PACU in stable condition.  PLAN OF CARE: Admit to inpatient   PATIENT DISPOSITION:  PACU - hemodynamically stable.   Nadara Mustard, MD 10/09/2011 7:32 PM

## 2011-10-09 NOTE — H&P (Signed)
[name] [name] is an 61 y.o. male.   Chief Complaint: Purulent draining abscess right wrist HPI:  Patient is a 61 year old gentleman status post close reduction distal radius fracture followed by open reduction internal fixation. Patient has had loss or reduction and started to have increasing pain with a purulent drainage. Patient was brought at this time for emergent irrigation debridement of the draining ulcer plan removal the internal fixation. Patient states he has a history of multiple staph infections and states it difficult for him to clear a staph infection.  Past Medical History  Diagnosis Date  . 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 Cough   . Esophageal reflux   . Osteopenia   . Personal history of other diseases of digestive system   . Contact dermatitis and other eczema, due to unspecified cause   . Unspecified sinusitis (chronic)   . Allergic rhinitis   . Bronchospastic airway disease   . COPD (chronic obstructive pulmonary disease)   . Hypertension   . Seasonal allergies   . Ulcerative colitis   . H/O hiatal hernia   . Arthritis     spine  . Anxiety   . Multiple open wounds     "from esczema- scratching"  . Multiple facial bone fractures   . 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 History of bronchitis   . Pneumonia     in past had several times  . Shortness of breath     "sometime"  . Chronic lower back pain   . Kidney stones   . Depression     patient constantly hitting left side of face "due to pain"    Past Surgical History  Procedure Date  . Litrotripsy   . Orif distal radius fracture 09/12/11    right  . Fracture surgery   . Metacarpal pinning     right hand  . Patella fracture surgery     right  . Tonsillectomy and adenoidectomy   . Eye surgery     3 Corneal transplant (bilateral eyes)    Family History  Problem Relation Age of Onset  . Allergies Mother     atopic  . Asthma Mother   . Heart failure  Mother     CHF  . Other Father   . Anesthesia problems Neg Hx    Social History:  reports that he quit smoking about 28 years ago. His smoking use included Cigarettes and Pipe. He has a 3 pack-year smoking history. He has never used smokeless tobacco. He reports that he drinks alcohol. He reports that he uses illicit drugs (Marijuana).  Allergies:  Allergies  Allergen Reactions  . Bacitracin-Polymyxin B Other (See Comments)    Polysporin allergy per opthalmologist.   . Bee Venom Anaphylaxis  . Codeine Other (See Comments)    Makes patient clear throat   . Norco (Hydrocodone-Acetaminophen) Other (See Comments)    causes frontal headaches  . Penicillins Other (See Comments)    Childhood allergy/ makes patient clear throat a lot.  . Tape Hives    "plastic tape"  . Chlorhexidine Itching and Rash    Medications Prior to Admission  Medication Sig Dispense Refill  . albuterol (PROVENTIL HFA;VENTOLIN HFA) 108 (90 BASE) MCG/ACT inhaler Inhale 2 puffs into the lungs every 4 (four) hours as needed. For shortness of breath      . albuterol (PROVENTIL) (2.5 MG/3ML) 0.083% nebulizer solution Take  2.5 mg by nebulization every 4 (four) hours as needed. For shortness of breath      . ARIPiprazole (ABILIFY) 2 MG tablet Take 2 mg by mouth daily.      . Calcium Carbonate-Vitamin D (CALCIUM 600+D) 600-400 MG-UNIT per tablet Take 1 tablet by mouth daily.      Marland Kitchen desoximetasone (TOPICORT) 0.05 % cream Apply 1 application topically 3 (three) times daily as needed. For itching on body      . diazepam (VALIUM) 10 MG tablet Take 10 mg by mouth 3 (three) times daily.       . Fluticasone-Salmeterol (ADVAIR DISKUS) 500-50 MCG/DOSE AEPB Inhale 1 puff into the lungs every 12 (twelve) hours.        Marland Kitchen HYDROmorphone (DILAUDID) 2 MG tablet Take 2 mg by mouth every 3 (three) hours as needed. For pain.      . hydrOXYzine (ATARAX/VISTARIL) 25 MG tablet Take 25 mg by mouth 3 (three) times daily as needed. For itching        . ibuprofen (ADVIL,MOTRIN) 200 MG tablet Take 800 mg by mouth every 6 (six) hours as needed. For pain      . mometasone (NASONEX) 50 MCG/ACT nasal spray Place 2 sprays into the nose daily.       . montelukast (SINGULAIR) 10 MG tablet Take 10 mg by mouth at bedtime.      Marland Kitchen omeprazole (PRILOSEC) 20 MG capsule Take 20 mg by mouth daily as needed. Acid reflux      . prednisoLONE acetate (PRED FORTE) 1 % ophthalmic suspension Place 1 drop into both eyes 3 (three) times daily.      . sertraline (ZOLOFT) 100 MG tablet Take 200 mg by mouth daily.       . Tamsulosin HCl (FLOMAX) 0.4 MG CAPS Take 1 capsule (0.4 mg total) by mouth daily.  30 capsule  0  . traZODone (DESYREL) 100 MG tablet Take 100 mg by mouth at bedtime as needed. For insomnia      . DISCONTD: HYDROmorphone (DILAUDID) 2 MG tablet Take 1 tablet (2 mg total) by mouth every 3 (three) hours as needed for pain.  15 tablet  0    Results for orders placed during the hospital encounter of 10/09/11 (from the past 48 hour(s))  APTT     Status: Normal   Collection Time   10/09/11  4:45 PM      Component Value Range Comment   aPTT 31  24 - 37 (seconds)   CBC     Status: Abnormal   Collection Time   10/09/11  4:45 PM      Component Value Range Comment   WBC 9.1  4.0 - 10.5 (K/uL)    RBC 3.96 (*) 4.22 - 5.81 (MIL/uL)    Hemoglobin 12.1 (*) 13.0 - 17.0 (g/dL)    HCT 16.1 (*) 09.6 - 52.0 (%)    MCV 91.2  78.0 - 100.0 (fL)    MCH 30.6  26.0 - 34.0 (pg)    MCHC 33.5  30.0 - 36.0 (g/dL)    RDW 04.5  40.9 - 81.1 (%)    Platelets 284  150 - 400 (K/uL)   COMPREHENSIVE METABOLIC PANEL     Status: Abnormal   Collection Time   10/09/11  4:45 PM      Component Value Range Comment   Sodium 137  135 - 145 (mEq/L)    Potassium 3.9  3.5 - 5.1 (mEq/L)    Chloride 102  96 - 112 (mEq/L)    CO2 26  19 - 32 (mEq/L)    Glucose, Bld 74  70 - 99 (mg/dL)    BUN 20  6 - 23 (mg/dL)    Creatinine, Ser 1.61  0.50 - 1.35 (mg/dL)    Calcium 9.6  8.4 - 10.5 (mg/dL)     Total Protein 7.6  6.0 - 8.3 (g/dL)    Albumin 3.7  3.5 - 5.2 (g/dL)    AST 23  0 - 37 (U/L)    ALT 19  0 - 53 (U/L)    Alkaline Phosphatase 97  39 - 117 (U/L)    Total Bilirubin 0.2 (*) 0.3 - 1.2 (mg/dL)    GFR calc non Af Amer 58 (*) >90 (mL/min)    GFR calc Af Amer 67 (*) >90 (mL/min)   PROTIME-INR     Status: Normal   Collection Time   10/09/11  4:45 PM      Component Value Range Comment   Prothrombin Time 13.4  11.6 - 15.2 (seconds)    INR 1.00  0.00 - 1.49     No results found.  Review of Systems  All other systems reviewed and are negative.    Blood pressure 140/90, pulse 94, temperature 98.6 F (37 C), temperature source Oral, resp. rate 20, SpO2 98.00%. Physical Exam  On examination there is pain with attempted range of motion of the wrist. Examination of the hand there is no cellulitis. There is a purulent draining sinus tract in the proximal aspect of the surgical incision. Assessment/Plan Assessment: Infection and internal fixation with purulent abscess right distal radius.  Plan we will plan for removal of deep retained hardware irrigation and debridement of the abscess placement of antibiotic beads with vancomycin and gentamicin. Risks and benefits were discussed including arthritis pain need for additional surgery need for IV antibiotics. Patient states he understands was pursued this time.  Brighten Orndoff V 10/09/2011, 6:05 PM

## 2011-10-09 NOTE — Anesthesia Preprocedure Evaluation (Addendum)
Anesthesia Evaluation  Patient identified by MRN, date of birth, ID band Patient awake    Reviewed: Allergy & Precautions, H&P , NPO status , Patient's Chart, lab work & pertinent test results  Airway Mallampati: II TM Distance: >3 FB     Dental  (+) Edentulous Upper and Edentulous Lower   Pulmonary shortness of breath, asthma , pneumonia , COPD COPD inhaler,          Cardiovascular hypertension,     Neuro/Psych PSYCHIATRIC DISORDERS Anxiety Depression    GI/Hepatic hiatal hernia, PUD, GERD-  ,(+) Hepatitis -, C  Endo/Other    Renal/GU      Musculoskeletal   Abdominal   Peds  Hematology   Anesthesia Other Findings   Reproductive/Obstetrics                          Anesthesia Physical Anesthesia Plan  ASA: III  Anesthesia Plan: General   Post-op Pain Management:    Induction: Intravenous  Airway Management Planned: LMA  Additional Equipment:   Intra-op Plan:   Post-operative Plan: Extubation in OR  Informed Consent: I have reviewed the patients History and Physical, chart, labs and discussed the procedure including the risks, benefits and alternatives for the proposed anesthesia with the patient or authorized representative who has indicated his/her understanding and acceptance.     Plan Discussed with:   Anesthesia Plan Comments:         Anesthesia Quick Evaluation

## 2011-10-09 NOTE — Anesthesia Postprocedure Evaluation (Signed)
  Anesthesia Post-op Note  Patient: [name] [name]  Procedure(s) Performed: Procedure(s) (LRB): HARDWARE REMOVAL (Right)  Patient Location: PACU  Anesthesia Type: GA combined with regional for post-op pain  Level of Consciousness: awake and alert   Airway and Oxygen Therapy: Patient Spontanous Breathing and Patient connected to nasal cannula oxygen  Post-op Pain: none  Post-op Assessment: Post-op Vital signs reviewed, Patient's Cardiovascular Status Stable, Respiratory Function Stable, Patent Airway and No signs of Nausea or vomiting  Post-op Vital Signs: Reviewed and stable  Complications: No apparent anesthesia complications

## 2011-10-09 NOTE — Progress Notes (Signed)
ANTICOAGULATION CONSULT NOTE - Initial Consult  Pharmacy Consult for Coumadin Indication: VTE prophylaxis s/p wrist surgery  Allergies  Allergen Reactions  . Bacitracin-Polymyxin B Other (See Comments)    Polysporin allergy per opthalmologist.   . Bee Venom Anaphylaxis  . Codeine Other (See Comments)    Makes patient clear throat   . Norco (Hydrocodone-Acetaminophen) Other (See Comments)    causes frontal headaches  . Penicillins Other (See Comments)    Childhood allergy/ makes patient clear throat a lot.  . Tape Hives    "plastic tape"  . Chlorhexidine Itching and Rash    Patient Measurements (09/28/11): 71" 69.2 kg  Vital Signs: Temp: 98.3 F (36.8 C) (05/23 2049) Temp src: Oral (05/23 1641) BP: 157/92 mmHg (05/23 2049) Pulse Rate: 93  (05/23 2049)  Labs:  Basename 10/09/11 1645  HGB 12.1*  HCT 36.1*  PLT 284  APTT 31  LABPROT 13.4  INR 1.00  HEPARINUNFRC --  CREATININE 1.31  CKTOTAL --  CKMB --  TROPONINI --    The CrCl is unknown because both a height and weight (above a minimum accepted value) are required for this calculation.   Medical History: Past Medical History  Diagnosis Date  . 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 Cough   . Esophageal reflux   . Osteopenia   . Personal history of other diseases of digestive system   . Contact dermatitis and other eczema, due to unspecified cause   . Unspecified sinusitis (chronic)   . Allergic rhinitis   . Bronchospastic airway disease   . COPD (chronic obstructive pulmonary disease)   . Hypertension   . Seasonal allergies   . Ulcerative colitis   . H/O hiatal hernia   . Arthritis     spine  . Anxiety   . Multiple open wounds     "from esczema- scratching"  . Multiple facial bone fractures   . 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 History of bronchitis   . Pneumonia     in past had several times  . Shortness of breath     "sometime"  . Chronic lower  back pain   . Kidney stones   . Depression     patient constantly hitting left side of face "due to pain"    Medications:  Prescriptions prior to admission  Medication Sig Dispense Refill  . albuterol (PROVENTIL HFA;VENTOLIN HFA) 108 (90 BASE) MCG/ACT inhaler Inhale 2 puffs into the lungs every 4 (four) hours as needed. For shortness of breath      . albuterol (PROVENTIL) (2.5 MG/3ML) 0.083% nebulizer solution Take 2.5 mg by nebulization every 4 (four) hours as needed. For shortness of breath      . ARIPiprazole (ABILIFY) 2 MG tablet Take 2 mg by mouth daily.      . Calcium Carbonate-Vitamin D (CALCIUM 600+D) 600-400 MG-UNIT per tablet Take 1 tablet by mouth daily.      Marland Kitchen desoximetasone (TOPICORT) 0.05 % cream Apply 1 application topically 3 (three) times daily as needed. For itching on body      . diazepam (VALIUM) 10 MG tablet Take 10 mg by mouth 3 (three) times daily.       . Fluticasone-Salmeterol (ADVAIR DISKUS) 500-50 MCG/DOSE AEPB Inhale 1 puff into the lungs every 12 (twelve) hours.        Marland Kitchen HYDROmorphone (DILAUDID) 2 MG tablet Take 2 mg by mouth every  3 (three) hours as needed. For pain.      . hydrOXYzine (ATARAX/VISTARIL) 25 MG tablet Take 25 mg by mouth 3 (three) times daily as needed. For itching      . ibuprofen (ADVIL,MOTRIN) 200 MG tablet Take 800 mg by mouth every 6 (six) hours as needed. For pain      . mometasone (NASONEX) 50 MCG/ACT nasal spray Place 2 sprays into the nose daily.       . montelukast (SINGULAIR) 10 MG tablet Take 10 mg by mouth at bedtime.      Marland Kitchen omeprazole (PRILOSEC) 20 MG capsule Take 20 mg by mouth daily as needed. Acid reflux      . prednisoLONE acetate (PRED FORTE) 1 % ophthalmic suspension Place 1 drop into both eyes 3 (three) times daily.      . sertraline (ZOLOFT) 100 MG tablet Take 200 mg by mouth daily.       . Tamsulosin HCl (FLOMAX) 0.4 MG CAPS Take 1 capsule (0.4 mg total) by mouth daily.  30 capsule  0  . traZODone (DESYREL) 100 MG tablet  Take 100 mg by mouth at bedtime as needed. For insomnia      . DISCONTD: HYDROmorphone (DILAUDID) 2 MG tablet Take 1 tablet (2 mg total) by mouth every 3 (three) hours as needed for pain.  15 tablet  0    Assessment: 61 yo male to begin Coumadin s/p wrist surgery. INR is normal at baseline.  Coumadin score = 4  Goal of Therapy:  INR 2-3 Monitor platelets by anticoagulation protocol: Yes   Plan:  Coumadin 7.5 mg po tonight INR daily Coumadin book and video   [place], [address].D., BCPS Clinical Pharmacist 10/09/2011 9:39 PM

## 2011-10-10 DIAGNOSIS — M009 Pyogenic arthritis, unspecified: Secondary | ICD-10-CM | POA: Diagnosis present

## 2011-10-10 LAB — PROTIME-INR
INR: 1.11 (ref 0.00–1.49)
Prothrombin Time: 14.5 seconds (ref 11.6–15.2)

## 2011-10-10 NOTE — Progress Notes (Signed)
Utilization review completed. Emireth Cockerham, RN, BSN.  10/10/11 

## 2011-10-10 NOTE — Progress Notes (Signed)
Patient ID: [name] [name], male   DOB: 1950/08/03, 61 y.o.   MRN: [id number] Postoperative day 1 status post removal of infected deep retained hardware right distal radius. Placement of antibiotic beads. Patient states that the local block is wearing off and he is starting to have sensation in his hand. We will set up occupational therapy to work on range of motion of his fingers. Plan for discharge to home this weekend.

## 2011-10-10 NOTE — Evaluation (Signed)
Occupational Therapy Evaluation Patient Details Name: [name] [name] MRN: [id number] DOB: [birth year] Today's Date: 10/10/2011 Time: 7829-5621 OT Time Calculation (min): 33 min  OT Assessment / Plan / Recommendation Clinical Impression  61 yr old male s/p removal of infected retained hardware Right distal radius with placement of antibiotic beads. Pt would benefit from either OP OT or HHOT to increase functional use of the hand & increase overall independence in his home. All education completed and pt presents with no further OT needs in acute.    OT Assessment  All further OT needs can be met in the next venue of care    Follow Up Recommendations  Home health OT;Outpatient OT    Barriers to Discharge      Equipment Recommendations       Recommendations for Other Services    Frequency       Precautions / Restrictions Precautions Precautions: None Restrictions Weight Bearing Restrictions: Yes RUE Weight Bearing: Non weight bearing   Pertinent Vitals/Pain     ADL  Eating/Feeding: Performed;Set up Grooming: Performed;Wash/dry hands;Supervision/safety Where Assessed - Grooming: Unsupported standing Toilet Transfer: Performed;Modified independent Toilet Transfer Method: Sit to Barista: Regular height toilet Toileting - Clothing Manipulation and Hygiene: Performed;Supervision/safety Where Assessed - Toileting Clothing Manipulation and Hygiene: Sit on 3-in-1 or toilet ADL Comments: Pt had already been educated on HEP for R hand to increase functional use of hand, decrease stiffness and swelling. Pt demo'd 10 reps of AAROM PIP, DIP, and MCP flexion, extension and thumb to finger opposition. Pt reports he has been spongebathing at home and required supervision at eval only due to being in an unfamiliar environment. Pt was also able to verbalize stratgies such as elevation and icing to decrease swelling in hand & fingers.    OT Diagnosis:    OT Problem  List:   OT Treatment Interventions:     OT Goals    Visit Information  Last OT Received On: 10/10/11 Assistance Needed: +1    Subjective Data  Subjective: I dont stand up on my feet too good. Patient Stated Goal: Hopefully get some outpt therapy   Prior Functioning  Home Living Lives With: Alone Available Help at Discharge: Other (Comment) (Pt does not know at this point in time.) Type of Home: House Home Access: Stairs to enter [employer] of Steps: 2 Entrance Stairs-Rails: Right;Left Home Layout: One level Bathroom Shower/Tub: Engineer, manufacturing systems: Standard Bathroom Accessibility: Yes How Accessible: Accessible via walker Home Adaptive Equipment: Shower chair with back;Other (comment) (blind cane.) Prior Function Level of Independence: Needs assistance Needs Assistance: Light Housekeeping Light Housekeeping: Total Driving: No Vocation: Unemployed Communication Communication: No difficulties Dominant Hand: Right    Cognition  Overall Cognitive Status: Appears within functional limits for tasks assessed/performed Arousal/Alertness: Awake/alert Orientation Level: Appears intact for tasks assessed Behavior During Session: [facility] for tasks performed Cognition - Other Comments: Pt very talkative, highly distractible and difficult to re-direct.    Extremity/Trunk Assessment Right Upper Extremity Assessment RUE ROM/Strength/Tone: Deficits;Unable to fully assess RUE ROM/Strength/Tone Deficits: Able to passively flex digits into a composite fist and oppose thumb to second digit. RUE Sensation: Deficits RUE Sensation Deficits: Reports numbness in fingertips. RUE Coordination: Deficits RUE Coordination Deficits: due to pain and stiffness. Left Upper Extremity Assessment LUE ROM/Strength/Tone: WFL for tasks assessed LUE Sensation: WFL - Light Touch;WFL - Proprioception LUE Coordination: WFL - gross/fine motor   Mobility Bed Mobility Supine to Sit: 7:  Independent Sitting - Scoot to [place]  of Bed: 7: Independent Sit to Supine: 7: Independent Transfers Sit to Stand: 5: Supervision;With upper extremity assist;From bed Stand to Sit: 5: Supervision;With upper extremity assist;To bed Details for Transfer Assistance: supervision needed due to pt being in unfamiliar surroundings.   Exercise    Balance    End of Session OT - End of Session Activity Tolerance: Patient tolerated treatment well Patient left: with call bell/phone within reach   Shiniqua Groseclose A OTR/L [phone] 10/10/2011, 12:10 PM

## 2011-10-10 NOTE — Progress Notes (Signed)
CARE MANAGEMENT NOTE 10/10/2011  Patient:  [name] [name], [name] [name]   Account Number:  [id number]  Date Initiated:  10/10/2011  Documentation initiated by:  Vance Peper  Subjective/Objective Assessment:   61 yr old male s/p removal of infected retained hardware Right distal radius with placement of antibiotic beads.     Action/Plan:   Will follow   Anticipated DC Date:  10/11/2011   Anticipated DC Plan:

## 2011-10-11 NOTE — Progress Notes (Signed)
Patient ID: [name] [name], male   DOB: March 05, 1951, 61 y.o.   MRN: [id number] Patient is postoperative from removal of deep retained hardware right wrist with infection placement of antibiotic beads. Patient states he is having increasing pain today. He will continue his IV antibiotics through today plan for discharge to home tomorrow. Patient states that he does have a home health sitter that will be with him at home.

## 2011-10-12 LAB — WOUND CULTURE

## 2011-10-12 MED ORDER — OXYCODONE-ACETAMINOPHEN 5-325 MG PO TABS: 1.0000 | ORAL_TABLET | ORAL | Status: AC | PRN

## 2011-10-12 NOTE — Discharge Summary (Signed)
Physician Discharge Summary  Patient ID: [name] [name] MRN: [id number] DOB/AGE: 1950/06/25 61 y.o.  Admit date: 10/09/2011 Discharge date: 10/12/2011  Admission Diagnoses: Infected internal fixation of right distal radius  Discharge Diagnoses: Infected internal fixation of right distal radius Principal Problem:  *Wrist joint infection   Discharged Condition: stable  Hospital Course: Patient's hospital course was essentially unremarkable he underwent irrigation and debridement of the wrist abscess removal of the internal fixation plate and screws and placement of antibiotic beads. Patient was kept on IV antibiotics and was discharged to home in stable condition.  Consults: None  Significant Diagnostic Studies: labs: Routine labs  Treatments: surgery: Please see operative note  Discharge Exam: Blood pressure 143/83, pulse 77, temperature 98.5 F (36.9 C), temperature source Oral, resp. rate 18, SpO2 94.00%. Incision/Wound: incision clean and dry at time of discharge  Disposition: 01-Home or Self Care  Discharge Orders    Future Orders Please Complete By Expires   Diet - low sodium heart healthy      Call MD / Call 911      Comments:   If you experience chest pain or shortness of breath, CALL 911 and be transported to the hospital emergency room.  If you develope a fever above 101 F, pus (white drainage) or increased drainage or redness at the wound, or calf pain, call your surgeon's office.   Constipation Prevention      Comments:   Drink plenty of fluids.  Prune juice may be helpful.  You may use a stool softener, such as Colace (over the counter) 100 mg twice a day.  Use MiraLax (over the counter) for constipation as needed.   Increase activity slowly as tolerated        Medication List  As of 10/12/2011  6:16 AM   TAKE these medications         ADVAIR DISKUS 500-50 MCG/DOSE Aepb   Generic drug: Fluticasone-Salmeterol   Inhale 1 puff into the lungs every 12 (twelve)  hours.      albuterol 108 (90 BASE) MCG/ACT inhaler   Commonly known as: PROVENTIL HFA;VENTOLIN HFA   Inhale 2 puffs into the lungs every 4 (four) hours as needed. For shortness of breath      albuterol (2.5 MG/3ML) 0.083% nebulizer solution   Commonly known as: PROVENTIL   Take 2.5 mg by nebulization every 4 (four) hours as needed. For shortness of breath      ARIPiprazole 2 MG tablet   Commonly known as: ABILIFY   Take 2 mg by mouth daily.      Calcium 600+D 600-400 MG-UNIT per tablet   Generic drug: Calcium Carbonate-Vitamin D   Take 1 tablet by mouth daily.      desoximetasone 0.05 % cream   Commonly known as: TOPICORT   Apply 1 application topically 3 (three) times daily as needed. For itching on body      diazepam 10 MG tablet   Commonly known as: VALIUM   Take 10 mg by mouth 3 (three) times daily.      HYDROmorphone 2 MG tablet   Commonly known as: DILAUDID   Take 2 mg by mouth every 3 (three) hours as needed. For pain.      hydrOXYzine 25 MG tablet   Commonly known as: ATARAX/VISTARIL   Take 25 mg by mouth 3 (three) times daily as needed. For itching      ibuprofen 200 MG tablet   Commonly known as: ADVIL,MOTRIN   Take 800  mg by mouth every 6 (six) hours as needed. For pain      montelukast 10 MG tablet   Commonly known as: SINGULAIR   Take 10 mg by mouth at bedtime.      NASONEX 50 MCG/ACT nasal spray   Generic drug: mometasone   Place 2 sprays into the nose daily.      omeprazole 20 MG capsule   Commonly known as: PRILOSEC   Take 20 mg by mouth daily as needed. Acid reflux      oxyCODONE-acetaminophen 5-325 MG per tablet   Commonly known as: PERCOCET   Take 1 tablet by mouth every 4 (four) hours as needed for pain.      prednisoLONE acetate 1 % ophthalmic suspension   Commonly known as: PRED FORTE   Place 1 drop into both eyes 3 (three) times daily.      sertraline 100 MG tablet   Commonly known as: ZOLOFT   Take 200 mg by mouth daily.       Tamsulosin HCl 0.4 MG Caps   Commonly known as: FLOMAX   Take 1 capsule (0.4 mg total) by mouth daily.      traZODone 100 MG tablet   Commonly known as: DESYREL   Take 100 mg by mouth at bedtime as needed. For insomnia           Follow-up Information    Follow up with Rickey Farrier V, MD in 1 week.   Contact information:   [address] [place] [address] [phone]          Signed: Nadara Mustard 10/12/2011, 6:16 AM

## 2011-10-14 ENCOUNTER — Encounter (HOSPITAL_COMMUNITY): Payer: Self-pay | Admitting: Orthopedic Surgery

## 2011-10-14 LAB — ANAEROBIC CULTURE

## 2011-11-11 ENCOUNTER — Emergency Department (HOSPITAL_COMMUNITY): Payer: Medicaid Other

## 2011-11-11 ENCOUNTER — Encounter (HOSPITAL_COMMUNITY): Payer: Self-pay | Admitting: *Deleted

## 2011-11-11 ENCOUNTER — Emergency Department (HOSPITAL_COMMUNITY)
Admission: EM | Admit: 2011-11-11 | Discharge: 2011-11-12 | Disposition: A | Discharge status: Home / Self Care | Payer: Medicaid Other | Attending: Emergency Medicine | Admitting: Emergency Medicine

## 2011-11-11 DIAGNOSIS — K219 Gastro-esophageal reflux disease without esophagitis: Secondary | ICD-10-CM | POA: Insufficient documentation

## 2011-11-11 DIAGNOSIS — J4489 Other specified chronic obstructive pulmonary disease: Secondary | ICD-10-CM | POA: Insufficient documentation

## 2011-11-11 DIAGNOSIS — R112 Nausea with vomiting, unspecified: Secondary | ICD-10-CM | POA: Insufficient documentation

## 2011-11-11 DIAGNOSIS — H548 Legal blindness, as defined in USA: Secondary | ICD-10-CM | POA: Insufficient documentation

## 2011-11-11 DIAGNOSIS — I10 Essential (primary) hypertension: Secondary | ICD-10-CM | POA: Insufficient documentation

## 2011-11-11 DIAGNOSIS — K861 Other chronic pancreatitis: Secondary | ICD-10-CM | POA: Insufficient documentation

## 2011-11-11 DIAGNOSIS — Z87442 Personal history of urinary calculi: Secondary | ICD-10-CM | POA: Insufficient documentation

## 2011-11-11 DIAGNOSIS — K859 Acute pancreatitis without necrosis or infection, unspecified: Secondary | ICD-10-CM | POA: Insufficient documentation

## 2011-11-11 DIAGNOSIS — R1013 Epigastric pain: Secondary | ICD-10-CM | POA: Insufficient documentation

## 2011-11-11 DIAGNOSIS — J449 Chronic obstructive pulmonary disease, unspecified: Secondary | ICD-10-CM | POA: Insufficient documentation

## 2011-11-11 HISTORY — DX: Acute pancreatitis without necrosis or infection, unspecified: K85.90

## 2011-11-11 LAB — BASIC METABOLIC PANEL
BUN: 40 mg/dL — ABNORMAL HIGH (ref 6–23)
Calcium: 10.1 mg/dL (ref 8.4–10.5)
Creatinine, Ser: 1.99 mg/dL — ABNORMAL HIGH (ref 0.50–1.35)
GFR calc Af Amer: 40 mL/min — ABNORMAL LOW (ref >90)
GFR calc non Af Amer: 35 mL/min — ABNORMAL LOW (ref >90)

## 2011-11-11 LAB — CBC
MCHC: 35.6 g/dL (ref 30.0–36.0)
RDW: 13.9 % (ref 11.5–15.5)

## 2011-11-11 LAB — DIFFERENTIAL
Basophils Absolute: 0 10*3/uL (ref 0.0–0.1)
Basophils Relative: 0 % (ref 0–1)
Monocytes Absolute: 1.4 10*3/uL — ABNORMAL HIGH (ref 0.1–1.0)
Neutro Abs: 8.1 10*3/uL — ABNORMAL HIGH (ref 1.7–7.7)
Neutrophils Relative %: 67 % (ref 43–77)

## 2011-11-11 LAB — HEPATIC FUNCTION PANEL
Albumin: 4.4 g/dL (ref 3.5–5.2)
Total Bilirubin: 0.7 mg/dL (ref 0.3–1.2)
Total Protein: 8.2 g/dL (ref 6.0–8.3)

## 2011-11-11 LAB — URINALYSIS, ROUTINE W REFLEX MICROSCOPIC
Glucose, UA: NEGATIVE mg/dL
Leukocytes, UA: NEGATIVE
Nitrite: NEGATIVE
Protein, ur: NEGATIVE mg/dL

## 2011-11-11 MED ORDER — SODIUM CHLORIDE 0.9 % IV BOLUS (SEPSIS)
1000.0000 mL | Freq: Once | INTRAVENOUS | Status: AC
  Administered 2011-11-11: 1000 mL via INTRAVENOUS

## 2011-11-11 MED ORDER — OXYCODONE-ACETAMINOPHEN 5-325 MG PO TABS
1.0000 | ORAL_TABLET | Freq: Once | ORAL | Status: AC
  Administered 2011-11-11: 1 via ORAL
  Filled 2011-11-11: qty 1

## 2011-11-11 MED ORDER — ONDANSETRON HCL 4 MG/2ML IJ SOLN
4.0000 mg | Freq: Once | INTRAMUSCULAR | Status: AC
  Administered 2011-11-11: 4 mg via INTRAVENOUS
  Filled 2011-11-11: qty 2

## 2011-11-11 MED ORDER — OXYCODONE-ACETAMINOPHEN 5-325 MG PO TABS: 1.0000 | ORAL_TABLET | ORAL | Status: AC | PRN

## 2011-11-11 MED ORDER — ONDANSETRON HCL 4 MG PO TABS: 4.0000 mg | ORAL_TABLET | Freq: Four times a day (QID) | ORAL | Status: AC

## 2011-11-11 MED ORDER — MORPHINE SULFATE 4 MG/ML IJ SOLN
4.0000 mg | Freq: Once | INTRAMUSCULAR | Status: AC
  Administered 2011-11-11: 4 mg via INTRAVENOUS
  Filled 2011-11-11: qty 1

## 2011-11-11 MED ORDER — FAMOTIDINE IN NACL 20-0.9 MG/50ML-% IV SOLN
20.0000 mg | Freq: Once | INTRAVENOUS | Status: AC
  Administered 2011-11-11: 20 mg via INTRAVENOUS
  Filled 2011-11-11: qty 50

## 2011-11-11 NOTE — ED Notes (Signed)
Notified by lab that we need recollect on urine. Pt made aware of the same

## 2011-11-11 NOTE — ED Provider Notes (Addendum)
History     CSN: [id number]  Arrival date & time 11/11/11  1941   First MD Initiated Contact with Patient 11/11/11 1950      Chief Complaint  Patient presents with  . Abdominal Pain    (Consider location/radiation/quality/duration/timing/severity/associated sxs/prior treatment) HPI Comments: Patient presents with epigastric pain and nausea and vomiting since Friday.  He's had no diarrhea.  He notes the pain is somewhat similar to his past pancreatitis pain.  He does give a history of hepatitis C but states his liver function tests have always been normal.  He denies any fevers.  He's had no abdominal surgeries. No significant change in cough or difficulty breathing.  No chest pain.  He has tried to drink water and other fluids but has still had nausea and vomiting even with this.  It worsens when he tries to have intake of any solids.  Patient is a 61 y.o. male presenting with abdominal pain. The history is provided by the patient.  Abdominal Pain The primary symptoms of the illness include abdominal pain, nausea and vomiting. The primary symptoms of the illness do not include fever or shortness of breath.  Symptoms associated with the illness do not include chills, hematuria or back pain.    Past Medical History  Diagnosis Date  . 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 Cough   . Esophageal reflux   . Osteopenia   . Personal history of other diseases of digestive system   . Contact dermatitis and other eczema, due to unspecified cause   . Unspecified sinusitis (chronic)   . Allergic rhinitis   . Unspecified asthma   . COPD (chronic obstructive pulmonary disease)   . Hypertension   . Seasonal allergies   . Ulcerative colitis   . H/O hiatal hernia   . Arthritis     spine  . Anxiety   . Multiple open wounds     "from esczema- scratching"  . Multiple facial bone fractures   . 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 History of bronchitis     . Pneumonia     in past had several times  . Shortness of breath     "sometime"  . Chronic lower back pain   . Kidney stones   . Depression     patient constantly hitting left side of face "due to pain"  . Pancreatitis     Past Surgical History  Procedure Date  . Litrotripsy   . Orif distal radius fracture 09/12/11    right  . Fracture surgery   . Metacarpal pinning     right hand  . Patella fracture surgery     right  . Tonsillectomy and adenoidectomy   . Eye surgery     3 Corneal transplant (bilateral eyes)  . Hardware removal 10/09/2011    Procedure: HARDWARE REMOVAL;  Surgeon: Nadara Mustard, MD;  Location: [facility] OR;  Service: Orthopedics;  Laterality: Right;  Removal Deep Hardware Right Wrist, placement antibiotic beads.    Family History  Problem Relation Age of Onset  . Allergies Mother     atopic  . Asthma Mother   . Heart failure Mother     CHF  . Other Father   . Anesthesia problems Neg Hx     History  Substance Use Topics  . Smoking status: Former Smoker -- 1.0 packs/day for 3 years    Types: Cigarettes,  Pipe    Quit date: 05/20/1983  . Smokeless tobacco: Never Used  . Alcohol Use: Yes     "scotch once a year"      Review of Systems  Constitutional: Negative.  Negative for fever and chills.  HENT: Negative.   Eyes: Negative.   Respiratory: Negative.  Negative for cough and shortness of breath.   Cardiovascular: Negative.  Negative for chest pain.  Gastrointestinal: Positive for nausea, vomiting and abdominal pain.  Genitourinary: Negative.  Negative for hematuria.  Musculoskeletal: Negative.  Negative for back pain.  Skin: Negative.  Negative for color change and rash.  Neurological: Negative for syncope and headaches.  Hematological: Negative.  Negative for adenopathy.  Psychiatric/Behavioral: Negative.  Negative for confusion.  All other systems reviewed and are negative.    Allergies  Bacitracin-polymyxin b; Bee venom; Codeine; Norco;  Penicillins; Tape; and Chlorhexidine  Home Medications   Current Outpatient Rx  Name Route Sig Dispense Refill  . ALBUTEROL SULFATE HFA 108 (90 BASE) MCG/ACT IN AERS Inhalation Inhale 2 puffs into the lungs every 4 (four) hours as needed. For shortness of breath    . ALBUTEROL SULFATE (2.5 MG/3ML) 0.083% IN NEBU Nebulization Take 2.5 mg by nebulization every 4 (four) hours as needed. For shortness of breath    . ARIPIPRAZOLE 2 MG PO TABS Oral Take 2 mg by mouth daily.    Marland Kitchen CALCIUM CARBONATE-VITAMIN D 600-400 MG-UNIT PO TABS Oral Take 1 tablet by mouth daily.    . DESOXIMETASONE 0.05 % EX CREA Topical Apply 1 application topically 3 (three) times daily as needed. For itching on body    . DIAZEPAM 10 MG PO TABS Oral Take 10 mg by mouth 3 (three) times daily.     Marland Kitchen FLUTICASONE-SALMETEROL 500-50 MCG/DOSE IN AEPB Inhalation Inhale 1 puff into the lungs every 12 (twelve) hours.      Marland Kitchen HYDROXYZINE HCL 25 MG PO TABS Oral Take 25 mg by mouth 3 (three) times daily as needed. For itching    . IBUPROFEN 200 MG PO TABS Oral Take 800 mg by mouth every 6 (six) hours as needed. For pain    . MOMETASONE FUROATE 50 MCG/ACT NA SUSP Nasal Place 2 sprays into the nose daily.     Marland Kitchen MONTELUKAST SODIUM 10 MG PO TABS Oral Take 10 mg by mouth at bedtime.    . OMEPRAZOLE 20 MG PO CPDR Oral Take 20 mg by mouth daily as needed. Acid reflux    . OXYCODONE-ACETAMINOPHEN 10-325 MG PO TABS Oral Take 1 tablet by mouth every 4 (four) hours. For pain    . PREDNISOLONE ACETATE 1 % OP SUSP Both Eyes Place 1 drop into both eyes 3 (three) times daily.    . SERTRALINE HCL 100 MG PO TABS Oral Take 200 mg by mouth daily.     . TRAZODONE HCL 100 MG PO TABS Oral Take 100 mg by mouth at bedtime as needed. For insomnia      BP 128/97  Pulse 96  Temp 99.5 F (37.5 C) (Oral)  Resp 15  SpO2 99%  Physical Exam  Nursing note and vitals reviewed. Constitutional: He is oriented to person, place, and time. He appears well-developed and  well-nourished.  Non-toxic appearance. He does not have a sickly appearance.  HENT:  Head: Normocephalic and atraumatic.  Eyes: Lids are normal. Right eye exhibits no discharge. Left eye exhibits no discharge. No scleral icterus.  Neck: Trachea normal, normal range of motion and full passive range  of motion without pain. Neck supple.  Cardiovascular: Normal rate, regular rhythm and normal heart sounds.   Pulmonary/Chest: Effort normal and breath sounds normal. No respiratory distress.  Abdominal: Soft. Normal appearance. He exhibits no distension. There is tenderness. There is no rebound and no CVA tenderness.       Mild epigastric tenderness  Musculoskeletal: Normal range of motion. He exhibits no edema.  Neurological: He is alert and oriented to person, place, and time. He has normal strength.  Skin: Skin is warm, dry and intact. No rash noted.  Psychiatric: He has a normal mood and affect. His behavior is normal. Judgment and thought content normal.    ED Course  Procedures (including critical care time)  Results for orders placed during the hospital encounter of 11/11/11  BASIC METABOLIC PANEL      Component Value Range   Sodium 134 (*) 135 - 145 mEq/L   Potassium 3.7  3.5 - 5.1 mEq/L   Chloride 96  96 - 112 mEq/L   CO2 21  19 - 32 mEq/L   Glucose, Bld 108 (*) 70 - 99 mg/dL   BUN 40 (*) 6 - 23 mg/dL   Creatinine, Ser 1.61 (*) 0.50 - 1.35 mg/dL   Calcium 09.6  8.4 - 04.5 mg/dL   GFR calc non Af Amer 35 (*) >90 mL/min   GFR calc Af Amer 40 (*) >90 mL/min  CBC      Component Value Range   WBC 12.3 (*) 4.0 - 10.5 K/uL   RBC 5.32  4.22 - 5.81 MIL/uL   Hemoglobin 16.7  13.0 - 17.0 g/dL   HCT 40.9  81.1 - 91.4 %   MCV 88.2  78.0 - 100.0 fL   MCH 31.4  26.0 - 34.0 pg   MCHC 35.6  30.0 - 36.0 g/dL   RDW 78.2  95.6 - 21.3 %   Platelets 284  150 - 400 K/uL  DIFFERENTIAL      Component Value Range   Neutrophils Relative 67  43 - 77 %   Neutro Abs 8.1 (*) 1.7 - 7.7 K/uL    Lymphocytes Relative 21  12 - 46 %   Lymphs Abs 2.5  0.7 - 4.0 K/uL   Monocytes Relative 11  3 - 12 %   Monocytes Absolute 1.4 (*) 0.1 - 1.0 K/uL   Eosinophils Relative 1  0 - 5 %   Eosinophils Absolute 0.1  0.0 - 0.7 K/uL   Basophils Relative 0  0 - 1 %   Basophils Absolute 0.0  0.0 - 0.1 K/uL  LIPASE, BLOOD      Component Value Range   Lipase 147 (*) 11 - 59 U/L  HEPATIC FUNCTION PANEL      Component Value Range   Total Protein 8.2  6.0 - 8.3 g/dL   Albumin 4.4  3.5 - 5.2 g/dL   AST 18  0 - 37 U/L   ALT 20  0 - 53 U/L   Alkaline Phosphatase 96  39 - 117 U/L   Total Bilirubin 0.7  0.3 - 1.2 mg/dL   Bilirubin, Direct 0.1  0.0 - 0.3 mg/dL   Indirect Bilirubin 0.6  0.3 - 0.9 mg/dL       Date: 08/65/7846  Rate: 97  Rhythm: normal sinus rhythm  QRS Axis: normal  Intervals: normal  ST/T Wave abnormalities: normal  Conduction Disutrbances:none  Narrative Interpretation:   Old EKG Reviewed: unchanged from 08-24-11    MDM  Patient  with epigastric pain and nausea and vomiting since Friday.  Patient does have a history pancreatitis and states this is somewhat consistent with that.  Will obtain a CMP and lipase to further evaluate for this.  Patient does not have significant tenderness in his right upper quadrant to be a suspicious for cholecystitis.  He's had no diarrhea and with the several day history tabs is likely to be gastroenteritis.        Nat Christen, MD 11/11/11 2049  Nat Christen, MD 11/11/11 2114  Patient's pain is significantly improved with the pain medication here.  His nausea is also improved and he is now tolerating by mouth intake.  He does appear to have some mild pancreatitis.  He had a CT scan completed in May that did not show any gallstones to indicate need to perform further imaging at this time.  Patient is also noted to have prior mild elevations in his lipase to the low 100s previously.  Patient has mild renal insufficiency but as he is  now tolerating by mouth intake and his symptoms are improving I feel comfortable sending this patient home and he is comfortable with this plan.  We'll send him home with pain medications and nausea medications to continue to followup with his gastrointestinal specialist.  Nat Christen, MD 11/11/11 2328

## 2011-11-11 NOTE — ED Notes (Signed)
MD at bedside. 

## 2011-11-11 NOTE — ED Notes (Signed)
Since Friday has had nausea and vomiting. Has been having midline abdominal pain. Became dizzy today. Says he feels dehydrated. CBG 111. Pt is legally blind.

## 2011-11-11 NOTE — ED Notes (Signed)
[facility] has been called for transporte

## 2011-11-11 NOTE — ED Notes (Signed)
Pt provided with a ginger ale

## 2011-11-11 NOTE — Discharge Instructions (Signed)
Please follow up with your GI specialist.  Return for worsening pain, nausea, or other concerns.    Acute Pancreatitis The pancreas is a large gland located behind your stomach. It produces (secretes) enzymes. These enzymes help digest food. It also releases the hormones glucagon and insulin. These hormones help regulate blood sugar. When the pancreas becomes inflamed, the disease is called pancreatitis. Inflammation of the pancreas occurs when enzymes from the pancreas begin attacking and digesting the pancreas. CAUSES  Most cases ofsudden onset (acute) pancreatitis are caused by:  Alcohol abuse.   Gallstones.  Other less common causes are:  Some medications.   Exposure to certain chemicals   Infection.   Damage caused by an accident (trauma).   Surgery of the belly (abdomen).  SYMPTOMS  Acute pancreatitis usually begins with pain in the upper abdomen and may radiate to the back. This pain may last a couple days. The constant pain varies from mild to severe. The acute form of this disease may vary from mild, nonspecific abdominal pain to profound shock with coma. About 1 in 5 cases are severe. These patients become dehydrated and develop low blood pressure. In severe cases, bleeding into the pancreas can lead to shock and death. The lungs, heart, and kidneys may fail. DIAGNOSIS  Your caregiver will form a clinical opinion after giving you an exam. Laboratory work is used to confirm this diagnosis. Often,a digestive enzyme from the pancreas (serum amylase) and other enzymes are elevated. Sugars and fats (lipids) in the blood may be elevated. There may also be changes in the following levels: calcium, magnesium, potassium, chloride and bicarbonate (chemicals in the blood). X-rays, a CT scan, or ultrasound of your abdomen may be necessary to search for other causes of your abdominal pain. TREATMENT  Most pancreatitis requires treatment of symptoms. Most acute attacks last a couple of days.  Your caregiver can discuss the treatment options with you.  If complications occur, hospitalization may be necessary for pain control and intravenous (IV) fluid replacement.   Sometimes, a tube may be put into the stomach to control vomiting.   Food may not be allowed for 3 to 4 days. This gives the pancreas time to rest. Giving the pancreas a rest means there is no stimulation that would produce more enzymes and cause more damage.   Medicines (antibiotics) that kill germs may be given if infection is the cause.   Sometimes, surgery may be required.   Following an acute attack, your caregiver will determine the cause, if possible, and offer suggestions to prevent recurrences.  HOME CARE INSTRUCTIONS   Eat smaller, more frequent meals. This reduces the amount of digestive juices the pancreas produces.   Decrease the amount of fat in your diet. This may help reduce loose, diarrheal stools.   Drink enough water and fluids to keep your urine clear or pale yellow. This is to avoid dehydration which can cause increased pain.   Talk to your caregiver about pain relievers or other medicines that may help.   Avoid anything that may have triggered your pancreatitis (for example, alcohol).   Follow the diet advised by your caregiver. Do not advance the diet too soon.   Take medicines as prescribed.   Get plenty of rest.   Check your blood sugar at home as directed by your caregiver.   If your caregiver has given you a follow-up appointment, it is very important to keep that appointment. Not keeping the appointment could result in a lasting (chronic)  or permanent injury, pain, and disability. If there is any problem keeping the appointment, you must call to reschedule.  SEEK MEDICAL CARE IF:   You are not recovering in the time described by your caregiver.   You have persistent pain, weakness, or feel sick to your stomach (nauseous).   You have recovered and then have another bout of  pain.  SEEK IMMEDIATE MEDICAL CARE IF:   You are unable to eat or keep fluids down.   Your pain increases a lot or changes.   You have an oral temperature above 102 F (38.9 C), not controlled by medicine.   Your skin or the white part of your eyes look yellow (jaundice).   You develop vomiting.   You feel dizzy or faint.   Your blood sugar is high (over 300).  MAKE SURE YOU:   Understand these instructions.   Will watch your condition.   Will get help right away if you are not doing well or get worse.  Document Released: 05/05/2005 Document Revised: 04/24/2011 Document Reviewed: 12/17/2007 [facility] Patient Information 2012 [place], [place].

## 2011-11-23 ENCOUNTER — Other Ambulatory Visit: Payer: Self-pay | Admitting: Critical Care Medicine

## 2011-11-24 NOTE — ED Provider Notes (Signed)
Medical screening examination/treatment/procedure(s) were performed by non-physician practitioner and as supervising physician I was immediately available for consultation/collaboration.   Celene Kras, MD 11/24/11 1400

## 2011-11-25 ENCOUNTER — Other Ambulatory Visit: Payer: Self-pay | Admitting: *Deleted

## 2011-11-25 MED ORDER — FLUTICASONE-SALMETEROL 500-50 MCG/DOSE IN AEPB: 1.0000 | INHALATION_SPRAY | Freq: Two times a day (BID) | RESPIRATORY_TRACT | Status: DC

## 2011-12-09 DIAGNOSIS — H5710 Ocular pain, unspecified eye: Secondary | ICD-10-CM

## 2011-12-09 DIAGNOSIS — H5440 Blindness, one eye, unspecified eye: Secondary | ICD-10-CM | POA: Insufficient documentation

## 2011-12-24 DIAGNOSIS — L239 Allergic contact dermatitis, unspecified cause: Secondary | ICD-10-CM | POA: Insufficient documentation

## 2011-12-25 ENCOUNTER — Ambulatory Visit (INDEPENDENT_AMBULATORY_CARE_PROVIDER_SITE_OTHER): Payer: Medicaid Other | Admitting: Gastroenterology

## 2011-12-25 DIAGNOSIS — B182 Chronic viral hepatitis C: Secondary | ICD-10-CM

## 2011-12-25 LAB — PROTIME-INR
INR: 1 (ref <1.50)
Prothrombin Time: 13.6 seconds (ref 11.6–15.2)

## 2011-12-25 LAB — CBC WITH DIFFERENTIAL/PLATELET
Eosinophils Relative: 13 % — ABNORMAL HIGH (ref 0–5)
HCT: 43.8 % (ref 39.0–52.0)
Hemoglobin: 15 g/dL (ref 13.0–17.0)
Lymphocytes Relative: 18 % (ref 12–46)
Lymphs Abs: 1.4 10*3/uL (ref 0.7–4.0)
MCV: 89.4 fL (ref 78.0–100.0)
Monocytes Absolute: 0.5 10*3/uL (ref 0.1–1.0)
Neutro Abs: 5.1 10*3/uL (ref 1.7–7.7)
RBC: 4.9 MIL/uL (ref 4.22–5.81)
WBC: 8 10*3/uL (ref 4.0–10.5)

## 2011-12-25 LAB — IBC PANEL
TIBC: 314 ug/dL (ref 215–435)
UIBC: 247 ug/dL (ref 125–400)

## 2011-12-26 LAB — COMPLETE METABOLIC PANEL WITH GFR
AST: 25 U/L (ref 0–37)
Alkaline Phosphatase: 69 U/L (ref 39–117)
BUN: 18 mg/dL (ref 6–23)
Creat: 1.35 mg/dL (ref 0.50–1.35)
Potassium: 5 mEq/L (ref 3.5–5.3)
Total Bilirubin: 0.6 mg/dL (ref 0.3–1.2)

## 2011-12-26 LAB — AFP TUMOR MARKER: AFP-Tumor Marker: 3.1 ng/mL (ref 0.0–8.0)

## 2011-12-26 LAB — HEPATITIS B CORE ANTIBODY, TOTAL: Hep B Core Total Ab: NEGATIVE

## 2011-12-30 LAB — HEPATITIS C GENOTYPE

## 2012-01-01 ENCOUNTER — Encounter (HOSPITAL_COMMUNITY): Payer: Self-pay | Admitting: Emergency Medicine

## 2012-01-01 ENCOUNTER — Emergency Department (HOSPITAL_COMMUNITY)
Admission: EM | Admit: 2012-01-01 | Discharge: 2012-01-01 | Disposition: A | Discharge status: Home / Self Care | Payer: Medicaid Other | Attending: Emergency Medicine | Admitting: Emergency Medicine

## 2012-01-01 DIAGNOSIS — H5710 Ocular pain, unspecified eye: Secondary | ICD-10-CM | POA: Insufficient documentation
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
## 2012-01-01 NOTE — ED Notes (Signed)
PT. ARRIVED WITH EMS FROM HOME REPORTS PAIN / SWELLING / DRAINAGE AT LEFT EYE ONSET 3 DAYS AGO , STATES RECENT LEFT EYE SURGERY BY DR. KING AND DR. YATES AT [facility] [place] LAST Monday , PT. ADDED PRESCRIPTION PERCOCET IS NOT HELPING .

## 2012-01-14 ENCOUNTER — Encounter (HOSPITAL_COMMUNITY): Payer: Self-pay | Admitting: Pharmacy Technician

## 2012-01-16 ENCOUNTER — Other Ambulatory Visit: Payer: Self-pay | Admitting: Physician Assistant

## 2012-01-21 ENCOUNTER — Inpatient Hospital Stay (HOSPITAL_COMMUNITY)
Admission: RE | Admit: 2012-01-21 | Payer: Medicaid Other | Source: Ambulatory Visit | Attending: Gastroenterology | Admitting: Gastroenterology

## 2012-01-27 ENCOUNTER — Ambulatory Visit: Payer: Medicaid Other | Attending: Orthopedic Surgery | Admitting: *Deleted

## 2012-01-27 DIAGNOSIS — M2560 Stiffness of unspecified joint, not elsewhere classified: Secondary | ICD-10-CM | POA: Insufficient documentation

## 2012-01-27 DIAGNOSIS — M25549 Pain in joints of unspecified hand: Secondary | ICD-10-CM | POA: Insufficient documentation

## 2012-01-27 DIAGNOSIS — IMO0001 Reserved for inherently not codable concepts without codable children: Secondary | ICD-10-CM | POA: Insufficient documentation

## 2012-01-28 ENCOUNTER — Ambulatory Visit: Payer: Medicaid Other | Admitting: Occupational Therapy

## 2012-02-11 ENCOUNTER — Encounter: Payer: Self-pay | Admitting: Critical Care Medicine

## 2012-02-11 ENCOUNTER — Encounter: Payer: Medicaid Other | Admitting: Occupational Therapy

## 2012-02-11 ENCOUNTER — Ambulatory Visit (INDEPENDENT_AMBULATORY_CARE_PROVIDER_SITE_OTHER): Payer: Medicaid Other | Admitting: Critical Care Medicine

## 2012-02-11 VITALS — BP 126/88 | HR 67 | Temp 97.4°F | Ht 71.0 in | Wt 144.0 lb

## 2012-02-11 DIAGNOSIS — Z23 Encounter for immunization: Secondary | ICD-10-CM

## 2012-02-11 DIAGNOSIS — J45909 Unspecified asthma, uncomplicated: Secondary | ICD-10-CM

## 2012-02-11 MED ORDER — FLUTICASONE-SALMETEROL 500-50 MCG/DOSE IN AEPB: 1.0000 | INHALATION_SPRAY | Freq: Two times a day (BID) | RESPIRATORY_TRACT | Status: DC

## 2012-02-11 MED ORDER — FLUTICASONE PROPIONATE 50 MCG/ACT NA SUSP: 2.0000 | Freq: Every day | NASAL | Status: DC

## 2012-02-11 MED ORDER — ALBUTEROL SULFATE (2.5 MG/3ML) 0.083% IN NEBU: 2.5000 mg | INHALATION_SOLUTION | RESPIRATORY_TRACT | Status: DC | PRN

## 2012-02-11 MED ORDER — ALBUTEROL SULFATE HFA 108 (90 BASE) MCG/ACT IN AERS: 2.0000 | INHALATION_SPRAY | Freq: Four times a day (QID) | RESPIRATORY_TRACT | Status: DC | PRN

## 2012-02-11 MED ORDER — MONTELUKAST SODIUM 10 MG PO TABS: 10.0000 mg | ORAL_TABLET | Freq: Every day | ORAL | Status: DC

## 2012-02-11 NOTE — Progress Notes (Signed)
Subjective:    Patient ID: [name] [name], male    DOB: 1951/04/10, 61 y.o.   MRN: [id number]  HPI  61 y.o.  with known history of severe atopic asthma.   06/04/11 Acute OV  Complains of cough, congestion , wheezing for 1 week.  Has thick mucus and barking cough . Wheezing is getting worse. Needs samples is out of money to buy rx  Until 06/20/11.  No chest pain or edema.  OTC not helping.   02/11/2012 Doing well. No new issues.  No cough. No SABA use. Not seen since January 2013 PUL ASTHMA HISTORY 02/11/2012 01/07/2011  Symptoms 0-2 days/week >2 days/week  Nighttime awakenings 0-2/month 0-2/month  Interference with activity No limitations Minor limitations  SABA use 0-2 days/wk > 2 days/wk--not > 1 x/day  Exacerbations requiring oral steroids 0-1 / year 0-1 / year   Past Medical History  Diagnosis Date  . 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 Cough   . Esophageal reflux   . Osteopenia   . Personal history of other diseases of digestive system   . Contact dermatitis and other eczema, due to unspecified cause   . Unspecified sinusitis (chronic)   . Allergic rhinitis   . Unspecified asthma   . COPD (chronic obstructive pulmonary disease)   . Hypertension   . Seasonal allergies   . Ulcerative colitis   . H/O hiatal hernia   . Arthritis     spine  . Anxiety   . Multiple open wounds     "from esczema- scratching"  . Multiple facial bone fractures   . 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 History of bronchitis   . Pneumonia     in past had several times  . Shortness of breath     "sometime"  . Chronic lower back pain   . Kidney stones   . Depression     patient constantly hitting left side of face "due to pain"  . Pancreatitis      Family History  Problem Relation Age of Onset  . Allergies Mother     atopic  . Asthma Mother   . Heart failure Mother     CHF  . Other Father   . Anesthesia problems Neg Hx      History    Social History  . Marital Status: Single    Spouse Name: N/A    Number of Children: 1  . Years of Education: N/A   Occupational History  . does not work    Social History Main Topics  . Smoking status: Former Smoker -- 1.0 packs/day for 3 years    Types: Cigarettes, Pipe    Quit date: 05/20/1983  . Smokeless tobacco: Never Used  . Alcohol Use: Yes     "scotch once a year"  . Drug Use: Yes    Special: Marijuana     09/16/11 "smoke pot from time to time; last time 6-8 months ago"  . Sexually Active: Not on file   Other Topics Concern  . Not on file   Social History Narrative   Lives aloneBlind     Allergies  Allergen Reactions  . Bacitracin-Polymyxin B Other (See Comments)    Polysporin allergy per opthalmologist.   . Bee Venom Anaphylaxis  . Codeine Other (See Comments)    Makes patient clear throat   . Norco (Hydrocodone-Acetaminophen) Other (See Comments)  causes frontal headaches  . Penicillins Other (See Comments)    Childhood allergy/ makes patient clear throat a lot.  . Tape Hives    "plastic tape"  . Chlorhexidine Itching and Rash     Outpatient Prescriptions Prior to Visit  Medication Sig Dispense Refill  . Calcium Carbonate-Vitamin D (CALCIUM 600+D) 600-400 MG-UNIT per tablet Take 1 tablet by mouth daily.      Marland Kitchen desoximetasone (TOPICORT) 0.05 % cream Apply 1 application topically 3 (three) times daily as needed. For itching on body      . diazepam (VALIUM) 10 MG tablet Take 10 mg by mouth 3 (three) times daily.       Marland Kitchen erythromycin ophthalmic ointment Place 1 application into the left eye 2 (two) times daily.      . hydrOXYzine (ATARAX/VISTARIL) 25 MG tablet Take 25 mg by mouth 3 (three) times daily as needed. For itching      . ibuprofen (ADVIL,MOTRIN) 200 MG tablet Take 800 mg by mouth every 6 (six) hours as needed. For pain      . omeprazole (PRILOSEC) 20 MG capsule Take 20 mg by mouth daily as needed. Acid reflux      . prednisoLONE acetate (PRED  FORTE) 1 % ophthalmic suspension Place 1 drop into the right eye daily.       Marland Kitchen PRESCRIPTION MEDICATION Place 1 drop into both eyes 4 (four) times daily. Serum eye drop - special compound - Made at [facility].      . sertraline (ZOLOFT) 100 MG tablet Take 200 mg by mouth daily.       . temazepam (RESTORIL) 15 MG capsule Take 15 mg by mouth at bedtime as needed. For sleep      . traZODone (DESYREL) 100 MG tablet Take 100 mg by mouth at bedtime as needed. For insomnia      . albuterol (PROVENTIL HFA;VENTOLIN HFA) 108 (90 BASE) MCG/ACT inhaler Inhale 2 puffs into the lungs every 6 (six) hours as needed. For shortness of breath      . albuterol (PROVENTIL) (2.5 MG/3ML) 0.083% nebulizer solution Take 2.5 mg by nebulization every 4 (four) hours as needed. For shortness of breath      . fluticasone (FLONASE) 50 MCG/ACT nasal spray Place 2 sprays into the nose daily.      . Fluticasone-Salmeterol (ADVAIR DISKUS) 500-50 MCG/DOSE AEPB Inhale 1 puff into the lungs every 12 (twelve) hours.  60 each  6  . montelukast (SINGULAIR) 10 MG tablet Take 10 mg by mouth at bedtime.      . hydroxypropyl methylcellulose (ISOPTO TEARS) 2.5 % ophthalmic solution Place 1 drop into both eyes.      Marland Kitchen oxyCODONE-acetaminophen (PERCOCET) 10-325 MG per tablet Take 1 tablet by mouth every 6 (six) hours as needed. For pain           Review of Systems  Constitutional:   No  weight loss, night sweats,  Fevers, chills,  +fatigue, or  lassitude.  HEENT:   No headaches,  Difficulty swallowing,  Tooth/dental problems, or  Sore throat,                No sneezing, itching, ear ache, nasal congestion, post nasal drip,   CV:  No chest pain,  Orthopnea, PND, swelling in lower extremities, anasarca, dizziness, palpitations, syncope.   GI  No heartburn, indigestion, abdominal pain, nausea, vomiting, diarrhea, change in bowel habits, loss of appetite, bloody stools.   Resp:  No coughing up of blood.  No chest wall  deformity  Skin: no rash or lesions.  GU: no dysuria, change in color of urine, no urgency or frequency.  No flank pain, no hematuria   MS:  No joint pain or swelling.  No decreased range of motion.     Psych:  No change in mood or affect. No depression or anxiety.            Objective:   Physical Exam BP 126/88  Pulse 67  Temp 97.4 F (36.3 C) (Oral)  Ht 5\' 11"  (1.803 m)  Wt 144 lb (65.318 kg)  BMI 20.08 kg/m2  SpO2 97%  GEN: A/Ox3; pleasant , NAD, elderly , w/ walking cane -blind   HEENT:  [place]/AT,  EACs-clear, TMs-wnl, NOSE-clear, THROAT-clear, no lesions, no postnasal drip or exudate noted.   NECK:  Supple w/ fair ROM; no JVD; normal carotid impulses w/o bruits; no thyromegaly or nodules palpated; no lymphadenopathy.  RESP  clear without wheezes rales or rhonchi,no accessory muscle use, no dullness to percussion  CARD:  RRR, no m/r/g  , no peripheral edema, pulses intact, no cyanosis or clubbing.  GI:   Soft & nt; nml bowel sounds; no organomegaly or masses detected.  Musco: Warm bil, no deformities or joint swelling noted.   Neuro: alert, no focal deficits noted.    Skin: Warm, few excoriations along arms          Assessment & Plan:   ASTHMA, PERSISTENT, SEVERE Long-term severe persistent asthma steroid dependent in the past with significant atopic features now improved Plan Maintain inhaled medications as prescribed Return 4 months Administer flu vaccine    Updated Medication List Outpatient Encounter Prescriptions as of 02/11/2012  Medication Sig Dispense Refill  . albuterol (PROVENTIL HFA;VENTOLIN HFA) 108 (90 BASE) MCG/ACT inhaler Inhale 2 puffs into the lungs every 6 (six) hours as needed. For shortness of breath  1 Inhaler  6  . albuterol (PROVENTIL) (2.5 MG/3ML) 0.083% nebulizer solution Take 3 mLs (2.5 mg total) by nebulization every 4 (four) hours as needed. For shortness of breath  120 mL  6  . Calcium Carbonate-Vitamin D (CALCIUM 600+D)  600-400 MG-UNIT per tablet Take 1 tablet by mouth daily.      Marland Kitchen desoximetasone (TOPICORT) 0.05 % cream Apply 1 application topically 3 (three) times daily as needed. For itching on body      . diazepam (VALIUM) 10 MG tablet Take 10 mg by mouth 3 (three) times daily.       Marland Kitchen erythromycin ophthalmic ointment Place 1 application into the left eye 2 (two) times daily.      . fluticasone (FLONASE) 50 MCG/ACT nasal spray Place 2 sprays into the nose daily.  16 g  6  . Fluticasone-Salmeterol (ADVAIR DISKUS) 500-50 MCG/DOSE AEPB Inhale 1 puff into the lungs every 12 (twelve) hours.  60 each  6  . hydrOXYzine (ATARAX/VISTARIL) 25 MG tablet Take 25 mg by mouth 3 (three) times daily as needed. For itching      . ibuprofen (ADVIL,MOTRIN) 200 MG tablet Take 800 mg by mouth every 6 (six) hours as needed. For pain      . montelukast (SINGULAIR) 10 MG tablet Take 1 tablet (10 mg total) by mouth at bedtime.  30 tablet  6  . omeprazole (PRILOSEC) 20 MG capsule Take 20 mg by mouth daily as needed. Acid reflux      . prednisoLONE acetate (PRED FORTE) 1 % ophthalmic suspension Place 1 drop into the right eye daily.       Marland Kitchen  PRESCRIPTION MEDICATION Place 1 drop into both eyes 4 (four) times daily. Serum eye drop - special compound - Made at [facility].      . sertraline (ZOLOFT) 100 MG tablet Take 200 mg by mouth daily.       . temazepam (RESTORIL) 15 MG capsule Take 15 mg by mouth at bedtime as needed. For sleep      . traZODone (DESYREL) 100 MG tablet Take 100 mg by mouth at bedtime as needed. For insomnia      . DISCONTD: albuterol (PROVENTIL HFA;VENTOLIN HFA) 108 (90 BASE) MCG/ACT inhaler Inhale 2 puffs into the lungs every 6 (six) hours as needed. For shortness of breath      . DISCONTD: albuterol (PROVENTIL) (2.5 MG/3ML) 0.083% nebulizer solution Take 2.5 mg by nebulization every 4 (four) hours as needed. For shortness of breath      . DISCONTD: fluticasone (FLONASE) 50 MCG/ACT nasal spray Place 2 sprays into the  nose daily.      Marland Kitchen DISCONTD: Fluticasone-Salmeterol (ADVAIR DISKUS) 500-50 MCG/DOSE AEPB Inhale 1 puff into the lungs every 12 (twelve) hours.  60 each  6  . DISCONTD: montelukast (SINGULAIR) 10 MG tablet Take 10 mg by mouth at bedtime.      Marland Kitchen DISCONTD: hydroxypropyl methylcellulose (ISOPTO TEARS) 2.5 % ophthalmic solution Place 1 drop into both eyes.      Marland Kitchen DISCONTD: oxyCODONE-acetaminophen (PERCOCET) 10-325 MG per tablet Take 1 tablet by mouth every 6 (six) hours as needed. For pain

## 2012-02-11 NOTE — Patient Instructions (Addendum)
Flu vaccine today Refills sent on inhalers No other medication changes made, samples given Return 4 months

## 2012-02-11 NOTE — Assessment & Plan Note (Signed)
Long-term severe persistent asthma steroid dependent in the past with significant atopic features now improved Plan Maintain inhaled medications as prescribed Return 4 months Administer flu vaccine

## 2012-02-17 ENCOUNTER — Telehealth: Payer: Self-pay | Admitting: Critical Care Medicine

## 2012-02-17 NOTE — Telephone Encounter (Signed)
Member ID# [id number] S. Montelukast APPROVED for 1 year starting 02/17/2012 through 02/16/2013. The pharmacy has been notified and they will contact the pt to let them know when he can ouicj this up.

## 2012-02-24 ENCOUNTER — Other Ambulatory Visit: Payer: Self-pay | Admitting: Radiology

## 2012-02-25 ENCOUNTER — Ambulatory Visit: Payer: Medicaid Other | Admitting: Occupational Therapy

## 2012-03-01 ENCOUNTER — Encounter: Payer: Medicaid Other | Admitting: Occupational Therapy

## 2012-03-02 ENCOUNTER — Encounter (HOSPITAL_COMMUNITY): Payer: Self-pay

## 2012-03-03 ENCOUNTER — Ambulatory Visit (HOSPITAL_COMMUNITY)
Admission: RE | Admit: 2012-03-03 | Discharge: 2012-03-03 | Disposition: A | Discharge status: Home / Self Care | Payer: Medicaid Other | Source: Ambulatory Visit | Attending: Gastroenterology | Admitting: Gastroenterology

## 2012-03-03 ENCOUNTER — Encounter (HOSPITAL_COMMUNITY): Payer: Self-pay

## 2012-03-03 DIAGNOSIS — B182 Chronic viral hepatitis C: Secondary | ICD-10-CM | POA: Insufficient documentation

## 2012-03-03 LAB — CBC
MCHC: 34.8 g/dL (ref 30.0–36.0)
Platelets: 238 10*3/uL (ref 150–400)
RDW: 13.3 % (ref 11.5–15.5)

## 2012-03-03 LAB — PROTIME-INR
INR: 1.08 (ref 0.00–1.49)
Prothrombin Time: 13.9 s (ref 11.6–15.2)

## 2012-03-03 LAB — APTT: aPTT: 28 seconds (ref 24–37)

## 2012-03-03 MED ORDER — LORAZEPAM 2 MG/ML IJ SOLN: INTRAMUSCULAR | Status: AC | PRN

## 2012-03-03 MED ORDER — SODIUM CHLORIDE 0.9 % IV SOLN
INTRAVENOUS | Status: DC
  Administered 2012-03-03: 09:00:00 via INTRAVENOUS

## 2012-03-03 MED ORDER — OXYCODONE HCL 5 MG PO TABS
10.0000 mg | ORAL_TABLET | Freq: Once | ORAL | Status: AC
  Administered 2012-03-03: 10 mg via ORAL
  Filled 2012-03-03: qty 2

## 2012-03-03 MED ORDER — MIDAZOLAM HCL 2 MG/2ML IJ SOLN
INTRAMUSCULAR | Status: AC
  Filled 2012-03-03: qty 4

## 2012-03-03 MED ORDER — MIDAZOLAM HCL 2 MG/2ML IJ SOLN
INTRAMUSCULAR | Status: AC | PRN
  Administered 2012-03-03 (×3): 1 mg via INTRAVENOUS

## 2012-03-03 MED ORDER — OXYCODONE-ACETAMINOPHEN 5-325 MG PO TABS
ORAL_TABLET | ORAL | Status: AC
  Filled 2012-03-03: qty 2

## 2012-03-03 MED ORDER — HYDROXYZINE HCL 25 MG PO TABS
25.0000 mg | ORAL_TABLET | Freq: Once | ORAL | Status: AC
  Administered 2012-03-03: 25 mg via ORAL
  Filled 2012-03-03: qty 1

## 2012-03-03 MED ORDER — FENTANYL CITRATE 0.05 MG/ML IJ SOLN
INTRAMUSCULAR | Status: AC
  Filled 2012-03-03: qty 4

## 2012-03-03 MED ORDER — OXYCODONE-ACETAMINOPHEN 5-325 MG PO TABS: 2.0000 | ORAL_TABLET | Freq: Once | ORAL | Status: DC

## 2012-03-03 MED ORDER — FENTANYL CITRATE 0.05 MG/ML IJ SOLN
INTRAMUSCULAR | Status: AC | PRN
  Administered 2012-03-03 (×3): 50 ug via INTRAVENOUS

## 2012-03-03 MED ORDER — OXYCODONE HCL 5 MG PO TABS
ORAL_TABLET | ORAL | Status: AC
  Filled 2012-03-03: qty 2

## 2012-03-03 NOTE — H&P (Signed)
Chief Complaint: "I'm here for liver biopsy" Referring Physician:Zacks HPI: [name] [name] is an 61 y.o. male with multiple medical problems as below. He has a hx of chronic Hep C and is referred for random liver biopsy. I reviewed his other medical problems and meds. He offers no acute c/o today  Past Medical History:  Past Medical History  Diagnosis Date  . 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 Cough   . Esophageal reflux   . Osteopenia   . Personal history of other diseases of digestive system   . Contact dermatitis and other eczema, due to unspecified cause   . Unspecified sinusitis (chronic)   . Allergic rhinitis   . Unspecified asthma   . COPD (chronic obstructive pulmonary disease)   . Hypertension   . Seasonal allergies   . Ulcerative colitis   . H/O hiatal hernia   . Arthritis     spine  . Anxiety   . Multiple open wounds     "from esczema- scratching"  . Multiple facial bone fractures   . 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 History of bronchitis   . Pneumonia     in past had several times  . Shortness of breath     "sometime"  . Chronic lower back pain   . Kidney stones   . Depression     patient constantly hitting left side of face "due to pain"  . Pancreatitis     Past Surgical History:  Past Surgical History  Procedure Date  . Litrotripsy   . Orif distal radius fracture 09/12/11    right  . Fracture surgery   . Metacarpal pinning     right hand  . Patella fracture surgery     right  . Tonsillectomy and adenoidectomy   . Eye surgery     3 Corneal transplant (bilateral eyes)  . Hardware removal 10/09/2011    Procedure: HARDWARE REMOVAL;  Surgeon: Nadara Mustard, MD;  Location: [facility] OR;  Service: Orthopedics;  Laterality: Right;  Removal Deep Hardware Right Wrist, placement antibiotic beads.    Family History:  Family History  Problem Relation Age of Onset  . Allergies Mother     atopic  . Asthma Mother    . Heart failure Mother     CHF  . Other Father   . Anesthesia problems Neg Hx     Social History:  reports that he quit smoking about 28 years ago. His smoking use included Cigarettes and Pipe. He has a 3 pack-year smoking history. He has never used smokeless tobacco. He reports that he drinks alcohol. He reports that he uses illicit drugs (Marijuana).  Allergies:  Allergies  Allergen Reactions  . Bacitracin-Polymyxin B Other (See Comments)    Polysporin allergy per opthalmologist.   . Bee Venom Anaphylaxis  . Codeine Other (See Comments)    Makes patient clear throat   . Norco (Hydrocodone-Acetaminophen) Other (See Comments)    causes frontal headaches  . Penicillins Other (See Comments)    Childhood allergy/ makes patient clear throat a lot.  . Tape Hives    "plastic tape"  . Chlorhexidine Itching and Rash    Medications: albuterol (PROVENTIL HFA;VENTOLIN HFA) 108 (90 BASE) MCG/ACT inhaler 1 Inhaler 6 02/11/2012 Sig - Route: Inhale 2 puffs into the lungs every 6 (six) hours as needed. For shortness of breath - Inhalation Number of times this  order has been changed since signing: 1 [address] albuterol (PROVENTIL) (2.5 MG/3ML) 0.083% nebulizer solution 120 mL 6 02/11/2012 Sig - Route: Take 3 mLs (2.5 mg total) by nebulization every 4 (four) hours as needed. For shortness of breath - Nebulization Number of times this order has been changed since signing: 1 Order [address] Calcium Carbonate-Vitamin D (CALCIUM 600+D) 600-400 MG-UNIT per tablet Sig - Route: Take 1 tablet by mouth daily. - Oral Class: Historical Med Number of times this order has been changed since signing: 1 Order [address] desoximetasone (TOPICORT) 0.05 % cream Sig - Route: Apply 1 application topically 3 (three) times daily as needed. For itching on body - Topical Class: Historical Med Number of times this order has been changed since signing: 2 Order [address] diazepam (VALIUM) 10 MG tablet Sig - Route: Take 10  mg by mouth 3 (three) times daily. - Oral Class: Historical Med Number of times this order has been changed since signing: 4 Order [address] erythromycin ophthalmic ointment Sig - Route: Place 1 application into the left eye 2 (two) times daily. - Left Eye Class: Historical Med fluticasone (FLONASE) 50 MCG/ACT nasal spray 16 g 6 02/11/2012 Sig - Route: Place 2 sprays into the nose daily. - Nasal Number of times this order has been changed since signing: 1 Order [address] Fluticasone-Salmeterol (ADVAIR DISKUS) 500-50 MCG/DOSE AEPB 60 each 6 02/11/2012 Sig - Route: Inhale 1 puff into the lungs every 12 (twelve) hours. - Inhalation Number of times this order has been changed since signing: 1 Order [address] hydrOXYzine (ATARAX/VISTARIL) 25 MG tablet Sig - Route: Take 25 mg by mouth 3 (three) times daily as needed. For itching - Oral Class: Historical Med Number of times this order has been changed since signing: 2 Order [address] ibuprofen (ADVIL,MOTRIN) 200 MG tablet Sig - Route: Take 800 mg by mouth every 6 (six) hours as needed. For pain - Oral Class: Historical Med Number of times this order has been changed since signing: 1 Order [address] montelukast (SINGULAIR) 10 MG tablet 30 tablet 6 02/11/2012 Sig - Route: Take 1 tablet (10 mg total) by mouth at bedtime. - Oral Number of times this order has been changed since signing: 1 Order [address] omeprazole (PRILOSEC) 20 MG capsule Sig - Route: Take 20 mg by mouth daily as needed. Acid reflux - Oral Class: Historical Med Number of times this order has been changed since signing: 2 Order [address] prednisoLONE acetate (PRED FORTE) 1 % ophthalmic suspension Sig - Route: Place 1 drop into the right eye daily. - Right Eye Class: Historical Med Number of times this order has been changed since signing: 3 Order [address] PRESCRIPTION MEDICATION Sig - Route: Place 1 drop into both eyes 4 (four) times daily. Serum eye drop - special compound - Made at [facility]. - Both Eyes Class: Historical Med Number of times this order has been changed since signing: 1 Order [address] sertraline (ZOLOFT) 100 MG tablet Sig - Route: Take 200 mg by mouth daily. - Oral Class: Historical Med Number of times this order has been changed since signing: 4 Order [address] temazepam (RESTORIL) 15 MG capsule Sig - Route: Take 15 mg by mouth at bedtime as needed. For sleep - Oral Class: Historical Med Number of times this order has been changed since signing: 1 Order [address] traZODone (DESYREL) 100 MG tablet Sig - Route: Take 100 mg by mouth at bedtime as needed.  For insomnia - Oral Class: Historical Med   Please HPI for pertinent positives, otherwise complete 10 system ROS negative.  Physical Exam: Blood pressure 163/104, temperature 97 F (36.1 C), temperature source Oral, resp. rate 18, height 5\' 11"  (1.803 m), weight 145 lb (65.772 kg), SpO2 96.00%. Body mass index is 20.22 kg/(m^2).   General Appearance:  Alert, cooperative, no distress, appears stated age  Head:  Normocephalic, without obvious abnormality, atraumatic  ENT: Unremarkable  Neck: Supple, symmetrical, trachea midline, no adenopathy, thyroid: not enlarged, symmetric, no tenderness/mass/nodules  Lungs:   Clear to auscultation bilaterally, no w/r/r, respirations unlabored without use of accessory muscles.  Heart:  Regular rate and rhythm, S1, S2 normal, no murmur, rub or gallop. Carotids 2+ without bruit.  Abdomen:   Soft, non-tender, non distended. Bowel sounds active all four quadrants,  no masses, no organomegaly. No scars  Extremities: Extremities normal, atraumatic, no cyanosis or edema  Skin: Skin color, texture, turgor normal, no rashes or lesions  Neurologic: Normal affect, no gross deficits.   No results found for this or any previous visit (from the past 48 hour(s)). No results found.  Assessment/Plan Chronic Hepatitis C For [place] random liver biopsy Discussed procedure including risks  and complications. Labs pending Consent signed in chart  Brayton El PA-C 03/03/2012, 9:14 AM

## 2012-03-03 NOTE — ED Notes (Addendum)
Itching face continuously. Hitting face. History of extreme atopic excema.  Vistaril ordered and given.  Pt took inhaler from home for brief episode of tightness, wheeze relieved with inhlaer.  Dr shick aware.  Sats maintaing 97% on room air. Pt reports itching "fits" like this at home though this one not severe.

## 2012-03-03 NOTE — ED Notes (Signed)
Pt reports pain relieved

## 2012-03-03 NOTE — ED Notes (Signed)
PT c/o of pain 8 out of 10 RUQ.  Spoke with Dr Miles Costain.  Oxycodone ordered.  Pt did not want Percocet due to tylenol.

## 2012-03-03 NOTE — ED Notes (Signed)
Requested SS-C recovery bed

## 2012-03-03 NOTE — Procedures (Signed)
Successful [place] rt hep lobe 18 g core bx No comp Stable Path pending Full report in pacs

## 2012-03-03 NOTE — ED Notes (Signed)
Pt meditating to relieve itching.

## 2012-03-12 ENCOUNTER — Telehealth: Payer: Self-pay | Admitting: Critical Care Medicine

## 2012-03-12 MED ORDER — LORATADINE-PSEUDOEPHEDRINE ER 10-240 MG PO TB24: 1.0000 | ORAL_TABLET | Freq: Every day | ORAL | Status: DC

## 2012-03-12 NOTE — Telephone Encounter (Signed)
Ok for Rx for either [employer] D or plain clariten

## 2012-03-12 NOTE — Telephone Encounter (Signed)
Patient complaining of some congestion, cough, itchy eyes/nose, runny nose and feeling "stopped up" at times. Patient says he used to be on Allegra D until insurance stopped paying for this and then was switched to Claritin D but then has been over a year ago and he hasnt really needed anything before now. Patient is now requesting Rx for Claritin.  Dr. Delford Field please advise.

## 2012-03-12 NOTE — Telephone Encounter (Signed)
Called, spoke with pt.  Informed him of below per Dr. Delford Field.  He verbalized understanding and would like rx for claritin d sent to [facility].  Rx sent -- pt aware.

## 2012-04-28 ENCOUNTER — Telehealth: Payer: Self-pay | Admitting: Critical Care Medicine

## 2012-04-28 NOTE — Telephone Encounter (Signed)
Gave a verbal order for [facility] to dispense 2 boxes of 75mL that way the pt will have enough to last him.

## 2012-05-03 ENCOUNTER — Ambulatory Visit: Payer: Medicaid Other | Admitting: Adult Health

## 2012-05-27 ENCOUNTER — Ambulatory Visit: Payer: Medicaid Other | Admitting: Adult Health

## 2012-06-11 ENCOUNTER — Encounter: Payer: Self-pay | Admitting: Pulmonary Disease

## 2012-06-11 ENCOUNTER — Ambulatory Visit (INDEPENDENT_AMBULATORY_CARE_PROVIDER_SITE_OTHER): Payer: Medicaid Other | Admitting: Pulmonary Disease

## 2012-06-11 ENCOUNTER — Telehealth: Payer: Self-pay | Admitting: *Deleted

## 2012-06-11 VITALS — BP 160/110 | HR 100 | Temp 97.8°F | Ht 71.0 in | Wt 149.2 lb

## 2012-06-11 DIAGNOSIS — J45901 Unspecified asthma with (acute) exacerbation: Secondary | ICD-10-CM

## 2012-06-11 DIAGNOSIS — J45909 Unspecified asthma, uncomplicated: Secondary | ICD-10-CM

## 2012-06-11 MED ORDER — PREDNISONE 10 MG PO TABS: ORAL_TABLET | ORAL | Status: DC

## 2012-06-11 MED ORDER — METHYLPREDNISOLONE ACETATE 80 MG/ML IJ SUSP
120.0000 mg | Freq: Once | INTRAMUSCULAR | Status: AC
  Administered 2012-06-11: 120 mg via INTRAMUSCULAR

## 2012-06-11 NOTE — Telephone Encounter (Signed)
LMTCB

## 2012-06-11 NOTE — Telephone Encounter (Addendum)
Patient seen in office today by Dr Shelle Iron for an Acute Asthma Exacerbation. Patient is requesting to have his nasal medication changed. Originally put on Nasonex and given samples, pt states that apparantly his insurance has changed him to [place] and he states that this is not working. Would like to be switched back to Nasonex or something very close to this away from [facility].  Patient aware that PW not in office this morning but will be here this afternoon and this message will be sent to him to address.   Patient given samples of Nasonex.  Please advise Dr Delford Field. Thanks.

## 2012-06-11 NOTE — Patient Instructions (Addendum)
Will give you a depo injection now Prednisone taper over the next 8 days Will get you new prescriptions for your medications.  Please call us if you begin to bring up discolored mucus, and may need antibiotics. Keep followup with Dr. Delford Field, but call us if you are not improving.

## 2012-06-11 NOTE — Progress Notes (Signed)
  Subjective:    Patient ID: [name] [name], male    DOB: Apr 06, 1951, 62 y.o.   MRN: [id number]  HPI Patient comes in today for an acute sick visit.  He has known severe asthma, and gives a 5 day history of increasing chest tightness with wheezing, chest congestion, a dry patchy cough with occasional nonpurulent mucus, as well as increasing shortness of breath.  He feels like this is his usual asthma flare.   Review of Systems  Constitutional: Negative for fever and unexpected weight change.  HENT: Positive for congestion ( uses Nasonex). Negative for ear pain, nosebleeds, sore throat, rhinorrhea, sneezing, trouble swallowing, dental problem, postnasal drip and sinus pressure.   Eyes: Negative for redness and itching.  Respiratory: Positive for cough, chest tightness, shortness of breath and wheezing.   Cardiovascular: Negative for chest pain, palpitations and leg swelling.  Gastrointestinal: Negative for nausea and vomiting.  Genitourinary: Negative for dysuria.  Musculoskeletal: Negative for joint swelling.  Skin: Negative for rash.  Neurological: Negative for headaches.  Hematological: Does not bruise/bleed easily.  Psychiatric/Behavioral: Negative for dysphoric mood. The patient is not nervous/anxious.        Objective:   Physical Exam Thin male in no acute distress Nose without purulence or discharge noted Oropharynx clear Neck without lymphadenopathy or thyromegaly Chest with diffuse rhonchi, and a few wheezes.  No crackles noted Cardiac exam with regular rate and rhythm Lower extremities without edema, no cyanosis Alert and oriented, moves all 4 extremities.       Assessment & Plan:

## 2012-06-11 NOTE — Telephone Encounter (Signed)
Try nasonex,  Try to get approved

## 2012-06-11 NOTE — Addendum Note (Signed)
Addended by: Caryl Ada on: 06/11/2012 11:40 AM   Modules accepted: Orders

## 2012-06-11 NOTE — Assessment & Plan Note (Signed)
The patient's history is most consistent with an acute asthma exacerbation.  He does not feel that he has a chest cold at this time.  We'll give him a Depo-Medrol given the severity of his asthma, and also treat him with a prednisone pulse.  We'll also make sure that he has all of his medications filled.  He is to followup if he is not improving.

## 2012-06-11 NOTE — Addendum Note (Signed)
Addended by: Caryl Ada on: 06/11/2012 11:29 AM   Modules accepted: Orders

## 2012-06-23 ENCOUNTER — Ambulatory Visit: Payer: Medicaid Other | Attending: Orthopedic Surgery | Admitting: Occupational Therapy

## 2012-06-23 DIAGNOSIS — M2560 Stiffness of unspecified joint, not elsewhere classified: Secondary | ICD-10-CM | POA: Insufficient documentation

## 2012-06-23 DIAGNOSIS — M6281 Muscle weakness (generalized): Secondary | ICD-10-CM | POA: Insufficient documentation

## 2012-06-23 DIAGNOSIS — IMO0001 Reserved for inherently not codable concepts without codable children: Secondary | ICD-10-CM | POA: Insufficient documentation

## 2012-06-23 DIAGNOSIS — M2550 Pain in unspecified joint: Secondary | ICD-10-CM | POA: Insufficient documentation

## 2012-06-30 ENCOUNTER — Ambulatory Visit (INDEPENDENT_AMBULATORY_CARE_PROVIDER_SITE_OTHER): Payer: Medicaid Other | Admitting: Critical Care Medicine

## 2012-06-30 ENCOUNTER — Encounter: Payer: Self-pay | Admitting: Critical Care Medicine

## 2012-06-30 VITALS — BP 142/98 | HR 84 | Temp 98.2°F | Ht 71.0 in | Wt 146.2 lb

## 2012-06-30 DIAGNOSIS — J019 Acute sinusitis, unspecified: Secondary | ICD-10-CM

## 2012-06-30 DIAGNOSIS — J45901 Unspecified asthma with (acute) exacerbation: Secondary | ICD-10-CM

## 2012-06-30 MED ORDER — PREDNISONE 10 MG PO TABS: ORAL_TABLET | ORAL | Status: DC

## 2012-06-30 MED ORDER — CEFDINIR 300 MG PO CAPS: 600.0000 mg | ORAL_CAPSULE | Freq: Every day | ORAL | Status: DC

## 2012-06-30 NOTE — Progress Notes (Signed)
Subjective:    Patient ID: [name] [name], male    DOB: Oct 19, 1950, 62 y.o.   MRN: [id number]  HPI  62 y.o.  with known history of severe atopic asthma.   06/04/11 Acute OV  Complains of cough, congestion , wheezing for 1 week.  Has thick mucus and barking cough . Wheezing is getting worse. Needs samples is out of money to buy rx  Until 06/20/11.  No chest pain or edema.  OTC not helping.   02/11/2012 Doing well. No new issues.  No cough. No SABA use. Not seen since January 2013  06/30/2012 Pt was seen about one week ago.  Rx pred and Depo. No ABX .PT improved but then worse again. SYmptoms now chest tightness, more wheezing, notes more sinus issues.   PUL ASTHMA HISTORY 02/11/2012 01/07/2011  Symptoms 0-2 days/week >2 days/week  Nighttime awakenings 0-2/month 0-2/month  Interference with activity No limitations Minor limitations  SABA use 0-2 days/wk > 2 days/wk--not > 1 x/day  Exacerbations requiring oral steroids 0-1 / year 0-1 / year   Past Medical History  Diagnosis Date  . 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 Cough   . Esophageal reflux   . Osteopenia   . Personal history of other diseases of digestive system   . Contact dermatitis and other eczema, due to unspecified cause   . Unspecified sinusitis (chronic)   . Allergic rhinitis   . Unspecified asthma   . COPD (chronic obstructive pulmonary disease)   . Hypertension   . Seasonal allergies   . Ulcerative colitis   . H/O hiatal hernia   . Arthritis     spine  . Anxiety   . Multiple open wounds     "from esczema- scratching"  . Multiple facial bone fractures   . 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 History of bronchitis   . Pneumonia     in past had several times  . Shortness of breath     "sometime"  . Chronic lower back pain   . Kidney stones   . Depression     patient constantly hitting left side of face "due to pain"  . Pancreatitis      Family History   Problem Relation Age of Onset  . Allergies Mother     atopic  . Asthma Mother   . Heart failure Mother     CHF  . Other Father   . Anesthesia problems Neg Hx      History   Social History  . Marital Status: Single    Spouse Name: N/A    Number of Children: 1  . Years of Education: N/A   Occupational History  . does not work    Social History Main Topics  . Smoking status: Former Smoker -- 1.00 packs/day for 3 years    Types: Cigarettes, Pipe    Quit date: 05/20/1983  . Smokeless tobacco: Never Used  . Alcohol Use: Yes     Comment: "scotch once a year"  . Drug Use: Yes    Special: Marijuana     Comment: 09/16/11 "smoke pot from time to time; last time 6-8 months ago"  . Sexually Active: Not on file   Other Topics Concern  . Not on file   Social History Narrative   Lives alone   Blind           Allergies  Allergen Reactions  . Bacitracin-Polymyxin B Other (See Comments)    Polysporin allergy per opthalmologist.   . Bee Venom Anaphylaxis  . Codeine Other (See Comments)    Makes patient clear throat   . Norco (Hydrocodone-Acetaminophen) Other (See Comments)    causes frontal headaches  . Penicillins Other (See Comments)    Childhood allergy/ makes patient clear throat a lot.  . Tape Hives    "plastic tape"  . Chlorhexidine Itching and Rash     Outpatient Prescriptions Prior to Visit  Medication Sig Dispense Refill  . albuterol (PROVENTIL HFA;VENTOLIN HFA) 108 (90 BASE) MCG/ACT inhaler Inhale 2 puffs into the lungs every 6 (six) hours as needed. For shortness of breath  1 Inhaler  6  . albuterol (PROVENTIL) (2.5 MG/3ML) 0.083% nebulizer solution Take 3 mLs (2.5 mg total) by nebulization every 4 (four) hours as needed. For shortness of breath  120 mL  6  . Calcium Carbonate-Vitamin D (CALCIUM 600+D) 600-400 MG-UNIT per tablet Take 1 tablet by mouth daily.      Marland Kitchen desoximetasone (TOPICORT) 0.05 % cream Apply 1 application topically 3 (three) times daily as  needed. For itching on body      . diazepam (VALIUM) 10 MG tablet Take 10 mg by mouth 3 (three) times daily.       Marland Kitchen erythromycin ophthalmic ointment Place 1 application into the left eye 2 (two) times daily.      . fluticasone (FLONASE) 50 MCG/ACT nasal spray Place 2 sprays into the nose daily.  16 g  6  . Fluticasone-Salmeterol (ADVAIR DISKUS) 500-50 MCG/DOSE AEPB Inhale 1 puff into the lungs every 12 (twelve) hours.  60 each  6  . hydrOXYzine (ATARAX/VISTARIL) 25 MG tablet Take 25 mg by mouth 3 (three) times daily as needed. For itching      . ibuprofen (ADVIL,MOTRIN) 200 MG tablet Take 800 mg by mouth every 6 (six) hours as needed. For pain      . loratadine-pseudoephedrine (CLARITIN-D 24 HOUR) 10-240 MG per 24 hr tablet Take 1 tablet by mouth daily.  30 tablet  6  . montelukast (SINGULAIR) 10 MG tablet Take 1 tablet (10 mg total) by mouth at bedtime.  30 tablet  6  . omeprazole (PRILOSEC) 20 MG capsule Take 20 mg by mouth daily as needed. Acid reflux      . prednisoLONE acetate (PRED FORTE) 1 % ophthalmic suspension Place 1 drop into the right eye daily.       . sertraline (ZOLOFT) 100 MG tablet Take 200 mg by mouth daily.       . temazepam (RESTORIL) 15 MG capsule Take 15 mg by mouth at bedtime as needed. For sleep      . traZODone (DESYREL) 100 MG tablet Take 100 mg by mouth at bedtime as needed. For insomnia      . PRESCRIPTION MEDICATION Place 1 drop into both eyes 4 (four) times daily. Serum eye drop - special compound - Made at [facility].      . predniSONE (DELTASONE) 10 MG tablet Take 4 tabs for 3 days, 3 tabs for 3 days, 2 tabs for 3 days, 1 tab for 3 days then stop  30 tablet  0   No facility-administered medications prior to visit.       Review of Systems  Constitutional:   No  weight loss, night sweats,  Fevers, chills,  +fatigue, or  lassitude.  HEENT:   No headaches,  Difficulty swallowing,  Tooth/dental problems,  or  Sore throat,                No sneezing, itching,  ear ache, nasal congestion, post nasal drip,   CV:  No chest pain,  Orthopnea, PND, swelling in lower extremities, anasarca, dizziness, palpitations, syncope.   GI  No heartburn, indigestion, abdominal pain, nausea, vomiting, diarrhea, change in bowel habits, loss of appetite, bloody stools.   Resp:  No coughing up of blood.    No chest wall deformity  Skin: no rash or lesions.  GU: no dysuria, change in color of urine, no urgency or frequency.  No flank pain, no hematuria   MS:  No joint pain or swelling.  No decreased range of motion.     Psych:  No change in mood or affect. No depression or anxiety.            Objective:   Physical Exam BP 142/98  Pulse 84  Temp(Src) 98.2 F (36.8 C) (Oral)  Ht 5\' 11"  (1.803 m)  Wt 146 lb 3.2 oz (66.316 kg)  BMI 20.4 kg/m2  SpO2 97%  GEN: A/Ox3; pleasant , NAD, elderly , w/ walking cane -blind   HEENT:  [place]/AT,  EACs-clear, TMs-wnl, NOSE-clear, THROAT-clear, no lesions, +++ postnasal drip or exudate noted.  Nasal purulence NECK:  Supple w/ fair ROM; no JVD; normal carotid impulses w/o bruits; no thyromegaly or nodules palpated; no lymphadenopathy.  RESP  Exp wheeze ,no accessory muscle use, no dullness to percussion  CARD:  RRR, no m/r/g  , no peripheral edema, pulses intact, no cyanosis or clubbing.  GI:   Soft & nt; nml bowel sounds; no organomegaly or masses detected.  Musco: Warm bil, no deformities or joint swelling noted.   Neuro: alert, no focal deficits noted.    Skin: Warm, few excoriations along arms          Assessment & Plan:   No problem-specific assessment & plan notes found for this encounter.   Updated Medication List Outpatient Encounter Prescriptions as of 06/30/2012  Medication Sig Dispense Refill  . albuterol (PROVENTIL HFA;VENTOLIN HFA) 108 (90 BASE) MCG/ACT inhaler Inhale 2 puffs into the lungs every 6 (six) hours as needed. For shortness of breath  1 Inhaler  6  . albuterol (PROVENTIL) (2.5  MG/3ML) 0.083% nebulizer solution Take 3 mLs (2.5 mg total) by nebulization every 4 (four) hours as needed. For shortness of breath  120 mL  6  . Calcium Carbonate-Vitamin D (CALCIUM 600+D) 600-400 MG-UNIT per tablet Take 1 tablet by mouth daily.      Marland Kitchen desoximetasone (TOPICORT) 0.05 % cream Apply 1 application topically 3 (three) times daily as needed. For itching on body      . diazepam (VALIUM) 10 MG tablet Take 10 mg by mouth 3 (three) times daily.       Marland Kitchen erythromycin ophthalmic ointment Place 1 application into the left eye 2 (two) times daily.      . fluticasone (FLONASE) 50 MCG/ACT nasal spray Place 2 sprays into the nose daily.  16 g  6  . Fluticasone-Salmeterol (ADVAIR DISKUS) 500-50 MCG/DOSE AEPB Inhale 1 puff into the lungs every 12 (twelve) hours.  60 each  6  . hydrOXYzine (ATARAX/VISTARIL) 25 MG tablet Take 25 mg by mouth 3 (three) times daily as needed. For itching      . ibuprofen (ADVIL,MOTRIN) 200 MG tablet Take 800 mg by mouth every 6 (six) hours as needed. For pain      .  loratadine-pseudoephedrine (CLARITIN-D 24 HOUR) 10-240 MG per 24 hr tablet Take 1 tablet by mouth daily.  30 tablet  6  . montelukast (SINGULAIR) 10 MG tablet Take 1 tablet (10 mg total) by mouth at bedtime.  30 tablet  6  . omeprazole (PRILOSEC) 20 MG capsule Take 20 mg by mouth daily as needed. Acid reflux      . prednisoLONE acetate (PRED FORTE) 1 % ophthalmic suspension Place 1 drop into the right eye daily.       . sertraline (ZOLOFT) 100 MG tablet Take 200 mg by mouth daily.       . temazepam (RESTORIL) 15 MG capsule Take 15 mg by mouth at bedtime as needed. For sleep      . traZODone (DESYREL) 100 MG tablet Take 100 mg by mouth at bedtime as needed. For insomnia      . PRESCRIPTION MEDICATION Place 1 drop into both eyes 4 (four) times daily. Serum eye drop - special compound - Made at [facility].      . [DISCONTINUED] predniSONE (DELTASONE) 10 MG tablet Take 4 tabs for 3 days, 3 tabs for 3 days, 2 tabs  for 3 days, 1 tab for 3 days then stop  30 tablet  0   No facility-administered encounter medications on file as of 06/30/2012.

## 2012-06-30 NOTE — Assessment & Plan Note (Signed)
Recurrent exacerbation on basis of sinusitis flare Plan Pulse prednisone Po cefdinir

## 2012-06-30 NOTE — Patient Instructions (Addendum)
Cefdinir 600mg  daily for 10days Prednisone 10mg  Take 4 for three days 3 for three days 2 for three days 1 for three days and stop No other medication changes Return  1 month with tammy parrett for recheck

## 2012-07-14 ENCOUNTER — Ambulatory Visit: Payer: Medicaid Other | Admitting: Occupational Therapy

## 2012-07-21 ENCOUNTER — Ambulatory Visit: Payer: Medicaid Other | Admitting: Occupational Therapy

## 2012-07-28 ENCOUNTER — Ambulatory Visit: Payer: Medicaid Other | Attending: Orthopedic Surgery | Admitting: Occupational Therapy

## 2012-07-28 DIAGNOSIS — IMO0001 Reserved for inherently not codable concepts without codable children: Secondary | ICD-10-CM | POA: Insufficient documentation

## 2012-07-28 DIAGNOSIS — M2550 Pain in unspecified joint: Secondary | ICD-10-CM | POA: Insufficient documentation

## 2012-07-28 DIAGNOSIS — M6281 Muscle weakness (generalized): Secondary | ICD-10-CM | POA: Insufficient documentation

## 2012-07-28 DIAGNOSIS — M2560 Stiffness of unspecified joint, not elsewhere classified: Secondary | ICD-10-CM | POA: Insufficient documentation

## 2012-07-29 ENCOUNTER — Ambulatory Visit: Payer: Medicaid Other | Admitting: Adult Health

## 2012-08-03 ENCOUNTER — Encounter: Payer: Self-pay | Admitting: Adult Health

## 2012-08-03 ENCOUNTER — Ambulatory Visit (INDEPENDENT_AMBULATORY_CARE_PROVIDER_SITE_OTHER): Payer: Medicaid Other | Admitting: Adult Health

## 2012-08-03 VITALS — BP 142/70 | HR 83 | Temp 97.3°F | Ht 71.0 in | Wt 150.5 lb

## 2012-08-03 DIAGNOSIS — J45909 Unspecified asthma, uncomplicated: Secondary | ICD-10-CM

## 2012-08-03 DIAGNOSIS — J45901 Unspecified asthma with (acute) exacerbation: Secondary | ICD-10-CM

## 2012-08-03 NOTE — Patient Instructions (Addendum)
Keep up good work  Continue on current regimen.  follow up Dr. Delford Field  In 3 months and As needed

## 2012-08-03 NOTE — Assessment & Plan Note (Signed)
Compensated on present regimen  follow up Dr. Delford Field  In 3 months and As needed

## 2012-08-03 NOTE — Progress Notes (Signed)
Subjective:    Patient ID: [name] [name], male    DOB: 02/05/1951, 62 y.o.   MRN: [id number]  HPI  62 y.o.  with known history of severe atopic asthma.   06/04/11 Acute OV  Complains of cough, congestion , wheezing for 1 week.  Has thick mucus and barking cough . Wheezing is getting worse. Needs samples is out of money to buy rx  Until 06/20/11.  No chest pain or edema.  OTC not helping.   02/11/2012 Doing well. No new issues.  No cough. No SABA use. Not seen since January 2013  06/30/2012 Pt was seen about one week ago.  Rx pred and Depo. No ABX .PT improved but then worse again. SYmptoms now chest tightness, more wheezing, notes more sinus issues. >>[id number] x 10  And steroid taper  08/03/2012 Follow up  Returns for 1 month follow up . Seen 1 month ago for asthma and sinusitis flare . Tx w/ Omnicef and steroid taper.  He is feeling much better. Cough and congestion are decreased . No fever or hemotpysis .  Says the steroids really helped him all over. Skin is better as well.     PUL ASTHMA HISTORY 06/30/2012 02/11/2012 01/07/2011  Symptoms Daily 0-2 days/week >2 days/week  Nighttime awakenings Often--7/wk 0-2/month 0-2/month  Interference with activity Some limitations No limitations Minor limitations  SABA use Several times/day 0-2 days/wk > 2 days/wk--not > 1 x/day  Exacerbations requiring oral steroids 2 or more / year 0-1 / year 0-1 / year   Past Medical History  Diagnosis Date  . 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 Cough   . Esophageal reflux   . Osteopenia   . Personal history of other diseases of digestive system   . Contact dermatitis and other eczema, due to unspecified cause   . Unspecified sinusitis (chronic)   . Allergic rhinitis   . Unspecified asthma   . COPD (chronic obstructive pulmonary disease)   . Hypertension   . Seasonal allergies   . Ulcerative colitis   . H/O hiatal hernia   . Arthritis     spine  . Anxiety   .  Multiple open wounds     "from esczema- scratching"  . Multiple facial bone fractures   . 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 History of bronchitis   . Pneumonia     in past had several times  . Shortness of breath     "sometime"  . Chronic lower back pain   . Kidney stones   . Depression     patient constantly hitting left side of face "due to pain"  . Pancreatitis      Family History  Problem Relation Age of Onset  . Allergies Mother     atopic  . Asthma Mother   . Heart failure Mother     CHF  . Other Father   . Anesthesia problems Neg Hx      History   Social History  . Marital Status: Single    Spouse Name: N/A    Number of Children: 1  . Years of Education: N/A   Occupational History  . does not work    Social History Main Topics  . Smoking status: Former Smoker -- 1.00 packs/day for 3 years    Types: Cigarettes, Pipe    Quit date: 05/20/1983  . Smokeless tobacco: Never Used  .  Alcohol Use: Yes     Comment: "scotch once a year"  . Drug Use: Yes    Special: Marijuana     Comment: 09/16/11 "smoke pot from time to time; last time 6-8 months ago"  . Sexually Active: Not on file   Other Topics Concern  . Not on file   Social History Narrative   Lives alone   Blind           Allergies  Allergen Reactions  . Bacitracin-Polymyxin B Other (See Comments)    Polysporin allergy per opthalmologist.   . Bee Venom Anaphylaxis  . Codeine Other (See Comments)    Makes patient clear throat   . Norco (Hydrocodone-Acetaminophen) Other (See Comments)    causes frontal headaches  . Penicillins Other (See Comments)    Childhood allergy/ makes patient clear throat a lot.  . Tape Hives    "plastic tape"  . Chlorhexidine Itching and Rash     Outpatient Prescriptions Prior to Visit  Medication Sig Dispense Refill  . albuterol (PROVENTIL HFA;VENTOLIN HFA) 108 (90 BASE) MCG/ACT inhaler Inhale 2 puffs into the lungs every 6 (six) hours as needed.  For shortness of breath  1 Inhaler  6  . albuterol (PROVENTIL) (2.5 MG/3ML) 0.083% nebulizer solution Take 3 mLs (2.5 mg total) by nebulization every 4 (four) hours as needed. For shortness of breath  120 mL  6  . Calcium Carbonate-Vitamin D (CALCIUM 600+D) 600-400 MG-UNIT per tablet Take 1 tablet by mouth daily.      Marland Kitchen desoximetasone (TOPICORT) 0.05 % cream Apply 1 application topically 3 (three) times daily as needed. For itching on body      . diazepam (VALIUM) 10 MG tablet Take 10 mg by mouth 3 (three) times daily.       . fluticasone (FLONASE) 50 MCG/ACT nasal spray Place 2 sprays into the nose daily.  16 g  6  . Fluticasone-Salmeterol (ADVAIR DISKUS) 500-50 MCG/DOSE AEPB Inhale 1 puff into the lungs every 12 (twelve) hours.  60 each  6  . hydrOXYzine (ATARAX/VISTARIL) 25 MG tablet Take 25 mg by mouth 3 (three) times daily as needed. For itching      . ibuprofen (ADVIL,MOTRIN) 200 MG tablet Take 800 mg by mouth every 6 (six) hours as needed. For pain      . loratadine-pseudoephedrine (CLARITIN-D 24 HOUR) 10-240 MG per 24 hr tablet Take 1 tablet by mouth daily.  30 tablet  6  . montelukast (SINGULAIR) 10 MG tablet Take 1 tablet (10 mg total) by mouth at bedtime.  30 tablet  6  . omeprazole (PRILOSEC) 20 MG capsule Take 20 mg by mouth daily as needed. Acid reflux      . prednisoLONE acetate (PRED FORTE) 1 % ophthalmic suspension Place 1 drop into the right eye daily.       . sertraline (ZOLOFT) 100 MG tablet Take 200 mg by mouth daily.       . temazepam (RESTORIL) 15 MG capsule Take 15 mg by mouth at bedtime as needed. For sleep      . traZODone (DESYREL) 100 MG tablet Take 100 mg by mouth at bedtime as needed. For insomnia      . erythromycin ophthalmic ointment Place 1 application into the left eye 2 (two) times daily.      . cefdinir (OMNICEF) 300 MG capsule Take 2 capsules (600 mg total) by mouth daily.  20 capsule  0  . predniSONE (DELTASONE) 10 MG tablet Take 4 for  three days 3 for three  days 2 for three days 1 for three days and stop  30 tablet  0  . PRESCRIPTION MEDICATION Place 1 drop into both eyes 4 (four) times daily. Serum eye drop - special compound - Made at [facility].       No facility-administered medications prior to visit.       Review of Systems  Constitutional:   No  weight loss, night sweats,  Fevers, chills,  +fatigue, or  lassitude.  HEENT:   No headaches,  Difficulty swallowing,  Tooth/dental problems, or  Sore throat,                No sneezing, itching, ear ache,  +nasal congestion, post nasal drip,   CV:  No chest pain,  Orthopnea, PND, swelling in lower extremities, anasarca, dizziness, palpitations, syncope.   GI  No heartburn, indigestion, abdominal pain, nausea, vomiting, diarrhea, change in bowel habits, loss of appetite, bloody stools.   Resp:  No coughing up of blood.    No chest wall deformity  Skin: no rash or lesions.  GU: no dysuria, change in color of urine, no urgency or frequency.  No flank pain, no hematuria   MS:  No joint pain or swelling.  No decreased range of motion.     Psych:  No change in mood or affect. No depression or anxiety.            Objective:   Physical Exam BP 142/70  Pulse 83  Temp(Src) 97.3 F (36.3 C) (Oral)  Ht 5\' 11"  (1.803 m)  Wt 150 lb 8 oz (68.266 kg)  BMI 21 kg/m2  SpO2 96%  GEN: A/Ox3; pleasant , NAD, elderly , w/ walking cane -blind   HEENT:  [place]/AT,  EACs-clear, TMs-wnl, NOSE-clear drainage  THROAT-clear, no lesions, + postnasal drip or exudate noted.  Nasal purulence NECK:  Supple w/ fair ROM; no JVD; normal carotid impulses w/o bruits; no thyromegaly or nodules palpated; no lymphadenopathy.  RESP  CTA w/ no wheeze  ,no accessory muscle use, no dullness to percussion  CARD:  RRR, no m/r/g  , no peripheral edema, pulses intact, no cyanosis or clubbing.  GI:   Soft & nt; nml bowel sounds; no organomegaly or masses detected.  Musco: Warm bil, no deformities or joint swelling  noted.   Neuro: alert, no focal deficits noted.    Skin: Warm, few excoriations along arms          Assessment & Plan:   No problem-specific assessment & plan notes found for this encounter.   Updated Medication List Outpatient Encounter Prescriptions as of 08/03/2012  Medication Sig Dispense Refill  . albuterol (PROVENTIL HFA;VENTOLIN HFA) 108 (90 BASE) MCG/ACT inhaler Inhale 2 puffs into the lungs every 6 (six) hours as needed. For shortness of breath  1 Inhaler  6  . albuterol (PROVENTIL) (2.5 MG/3ML) 0.083% nebulizer solution Take 3 mLs (2.5 mg total) by nebulization every 4 (four) hours as needed. For shortness of breath  120 mL  6  . Calcium Carbonate-Vitamin D (CALCIUM 600+D) 600-400 MG-UNIT per tablet Take 1 tablet by mouth daily.      Marland Kitchen desoximetasone (TOPICORT) 0.05 % cream Apply 1 application topically 3 (three) times daily as needed. For itching on body      . diazepam (VALIUM) 10 MG tablet Take 10 mg by mouth 3 (three) times daily.       . fluticasone (FLONASE) 50 MCG/ACT nasal spray Place 2  sprays into the nose daily.  16 g  6  . Fluticasone-Salmeterol (ADVAIR DISKUS) 500-50 MCG/DOSE AEPB Inhale 1 puff into the lungs every 12 (twelve) hours.  60 each  6  . hydrOXYzine (ATARAX/VISTARIL) 25 MG tablet Take 25 mg by mouth 3 (three) times daily as needed. For itching      . ibuprofen (ADVIL,MOTRIN) 200 MG tablet Take 800 mg by mouth every 6 (six) hours as needed. For pain      . loratadine-pseudoephedrine (CLARITIN-D 24 HOUR) 10-240 MG per 24 hr tablet Take 1 tablet by mouth daily.  30 tablet  6  . montelukast (SINGULAIR) 10 MG tablet Take 1 tablet (10 mg total) by mouth at bedtime.  30 tablet  6  . omeprazole (PRILOSEC) 20 MG capsule Take 20 mg by mouth daily as needed. Acid reflux      . prednisoLONE acetate (PRED FORTE) 1 % ophthalmic suspension Place 1 drop into the right eye daily.       . sertraline (ZOLOFT) 100 MG tablet Take 200 mg by mouth daily.       . temazepam  (RESTORIL) 15 MG capsule Take 15 mg by mouth at bedtime as needed. For sleep      . traZODone (DESYREL) 100 MG tablet Take 100 mg by mouth at bedtime as needed. For insomnia      . [DISCONTINUED] erythromycin ophthalmic ointment Place 1 application into the left eye 2 (two) times daily.      . [DISCONTINUED] cefdinir (OMNICEF) 300 MG capsule Take 2 capsules (600 mg total) by mouth daily.  20 capsule  0  . [DISCONTINUED] predniSONE (DELTASONE) 10 MG tablet Take 4 for three days 3 for three days 2 for three days 1 for three days and stop  30 tablet  0  . [DISCONTINUED] PRESCRIPTION MEDICATION Place 1 drop into both eyes 4 (four) times daily. Serum eye drop - special compound - Made at [facility].       No facility-administered encounter medications on file as of 08/03/2012.

## 2012-08-03 NOTE — Assessment & Plan Note (Signed)
Recent flare now resolved.   

## 2012-08-05 ENCOUNTER — Ambulatory Visit: Payer: Medicaid Other | Admitting: Occupational Therapy

## 2012-08-10 ENCOUNTER — Encounter: Payer: Medicaid Other | Admitting: Occupational Therapy

## 2012-08-10 ENCOUNTER — Ambulatory Visit: Payer: Medicaid Other | Admitting: Occupational Therapy

## 2012-08-11 ENCOUNTER — Ambulatory Visit: Payer: Medicaid Other | Admitting: Occupational Therapy

## 2012-08-11 NOTE — Telephone Encounter (Signed)
Multiple attempts have been made to reach patient. I forgot to document when I tried to call. Patient has also been seen since this message created. Will now close this message.

## 2012-08-25 ENCOUNTER — Ambulatory Visit: Payer: Medicaid Other | Attending: Orthopedic Surgery | Admitting: *Deleted

## 2012-08-25 DIAGNOSIS — IMO0001 Reserved for inherently not codable concepts without codable children: Secondary | ICD-10-CM | POA: Insufficient documentation

## 2012-08-25 DIAGNOSIS — M6281 Muscle weakness (generalized): Secondary | ICD-10-CM | POA: Insufficient documentation

## 2012-08-25 DIAGNOSIS — M2550 Pain in unspecified joint: Secondary | ICD-10-CM | POA: Insufficient documentation

## 2012-08-25 DIAGNOSIS — M2560 Stiffness of unspecified joint, not elsewhere classified: Secondary | ICD-10-CM | POA: Insufficient documentation

## 2012-09-01 ENCOUNTER — Encounter: Payer: Medicaid Other | Admitting: Occupational Therapy

## 2012-09-08 ENCOUNTER — Ambulatory Visit: Payer: Medicaid Other | Admitting: Occupational Therapy

## 2012-09-15 ENCOUNTER — Encounter: Payer: Medicaid Other | Admitting: Occupational Therapy

## 2012-09-22 ENCOUNTER — Ambulatory Visit: Payer: Medicaid Other | Admitting: Occupational Therapy

## 2012-09-28 ENCOUNTER — Encounter: Payer: Medicaid Other | Admitting: Occupational Therapy

## 2012-10-19 ENCOUNTER — Telehealth: Payer: Self-pay | Admitting: Critical Care Medicine

## 2012-10-19 DIAGNOSIS — J45909 Unspecified asthma, uncomplicated: Secondary | ICD-10-CM

## 2012-10-19 NOTE — Telephone Encounter (Signed)
i am ok with this 

## 2012-10-19 NOTE — Telephone Encounter (Signed)
Pt has an upcoming appointment on 11-04-12. Pt states his PCP ordered for him to have a CXR and he wants to know can he have this done here at the time of his OV? Our radiology department requires an order from [facility] doctor so I advised I will ask Dr. Delford Field if it is ok to order CXR under him. Pt states understanding. Please advise. Carron Curie, CMA

## 2012-10-20 NOTE — Telephone Encounter (Signed)
[id number] x1 

## 2012-10-21 NOTE — Telephone Encounter (Signed)
CXR order has been placed for 11-04-12. ATC pt to advise, line busy, WCB. Carron Curie, CMA

## 2012-10-22 ENCOUNTER — Ambulatory Visit: Payer: Medicaid Other | Admitting: Critical Care Medicine

## 2012-10-22 NOTE — Telephone Encounter (Signed)
Pt aware that CXR order has been placed in [facility] and will come in sooner than OV time with TP to have done. Nothing more needed at this time. Will sign off on message.

## 2012-11-04 ENCOUNTER — Ambulatory Visit: Payer: Medicaid Other | Admitting: Adult Health

## 2012-11-06 ENCOUNTER — Other Ambulatory Visit: Payer: Self-pay | Admitting: Critical Care Medicine

## 2012-12-20 ENCOUNTER — Ambulatory Visit: Payer: Medicaid Other | Admitting: Adult Health

## 2013-03-11 ENCOUNTER — Telehealth: Payer: Self-pay | Admitting: Critical Care Medicine

## 2013-03-11 NOTE — Telephone Encounter (Signed)
Called and spoke with pt and he is aware that cxr  Order is in the computer from June.  Pt stated that he will come in for the appt with TP in November and will just have the cxr done after the appt since his transportation leaves him sitting at times.  Nothing further is needed.

## 2013-03-22 ENCOUNTER — Ambulatory Visit (INDEPENDENT_AMBULATORY_CARE_PROVIDER_SITE_OTHER): Payer: Medicaid Other | Admitting: Adult Health

## 2013-03-22 ENCOUNTER — Ambulatory Visit (INDEPENDENT_AMBULATORY_CARE_PROVIDER_SITE_OTHER)
Admission: RE | Admit: 2013-03-22 | Discharge: 2013-03-22 | Disposition: A | Discharge status: Home / Self Care | Payer: Medicaid Other | Source: Ambulatory Visit | Attending: Adult Health | Admitting: Adult Health

## 2013-03-22 ENCOUNTER — Encounter: Payer: Self-pay | Admitting: Adult Health

## 2013-03-22 ENCOUNTER — Ambulatory Visit
Admission: RE | Admit: 2013-03-22 | Discharge: 2013-03-22 | Disposition: A | Discharge status: Home / Self Care | Payer: Medicaid Other | Source: Ambulatory Visit | Attending: Adult Health | Admitting: Adult Health

## 2013-03-22 VITALS — BP 148/82 | HR 81 | Temp 97.8°F | Ht 71.0 in | Wt 162.8 lb

## 2013-03-22 DIAGNOSIS — R0600 Dyspnea, unspecified: Secondary | ICD-10-CM

## 2013-03-22 DIAGNOSIS — Z23 Encounter for immunization: Secondary | ICD-10-CM

## 2013-03-22 DIAGNOSIS — J45909 Unspecified asthma, uncomplicated: Secondary | ICD-10-CM

## 2013-03-22 DIAGNOSIS — R0609 Other forms of dyspnea: Secondary | ICD-10-CM

## 2013-03-22 MED ORDER — LEVOCETIRIZINE DIHYDROCHLORIDE 5 MG PO TABS: 5.0000 mg | ORAL_TABLET | Freq: Every evening | ORAL | Status: DC

## 2013-03-22 NOTE — Progress Notes (Signed)
Subjective:    Patient ID: [name] [name], male    DOB: 1950-09-07, 62 y.o.   MRN: [id number]  HPI  62 y.o.  with known history of severe atopic asthma.   06/04/11 Acute OV  Complains of cough, congestion , wheezing for 1 week.  Has thick mucus and barking cough . Wheezing is getting worse. Needs samples is out of money to buy rx  Until 06/20/11.  No chest pain or edema.  OTC not helping.   02/11/2012 Doing well. No new issues.  No cough. No SABA use. Not seen since January 2013  06/30/2012 Pt was seen about one week ago.  Rx pred and Depo. No ABX .PT improved but then worse again. SYmptoms now chest tightness, more wheezing, notes more sinus issues. >>omnicef x 10  And steroid taper  08/03/12 follow up  Returns for 1 month follow up . Seen 1 month ago for asthma and sinusitis flare . Tx w/ Omnicef and steroid taper.  He is feeling much better. Cough and congestion are decreased . No fever or hemotpysis .  Says the steroids really helped him all over. Skin is better as well.  03/22/2013 Follow up  Returns for follow up for asthma.  Says he is doing about the same.  Does have SOB, wheezing, prod cough with clear/white mucus at mainly at night x1-37months. Wants something to control drainage. Needs rx sent to pharmacy as he can not afford the OTC brands.  Denies f/c/s, nausea, vomiting, hemoptysis.  plans on having another cornea transplant at the end of the month.  Would like flu shot today No chest pain , orthopnea, fever, leg swelling, or n/v/d.    Past Medical History  Diagnosis Date  . 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 Cough   . Esophageal reflux   . Osteopenia   . Personal history of other diseases of digestive system   . Contact dermatitis and other eczema, due to unspecified cause   . Unspecified sinusitis (chronic)   . Allergic rhinitis   . Unspecified asthma(493.90)   . COPD (chronic obstructive pulmonary disease)   . Hypertension   .  Seasonal allergies   . Ulcerative colitis   . H/O hiatal hernia   . Arthritis     spine  . Anxiety   . Multiple open wounds     "from esczema- scratching"  . Multiple facial bone fractures   . 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 History of bronchitis   . Pneumonia     in past had several times  . Shortness of breath     "sometime"  . Chronic lower back pain   . Kidney stones   . Depression     patient constantly hitting left side of face "due to pain"  . Pancreatitis      Family History  Problem Relation Age of Onset  . Allergies Mother     atopic  . Asthma Mother   . Heart failure Mother     CHF  . Other Father   . Anesthesia problems Neg Hx      History   Social History  . Marital Status: Single    Spouse Name: N/A    Number of Children: 1  . Years of Education: N/A   Occupational History  . does not work    Social History Main Topics  . Smoking status: Former Smoker --  1.00 packs/day for 3 years    Types: Cigarettes, Pipe    Quit date: 05/20/1983  . Smokeless tobacco: Never Used  . Alcohol Use: Yes     Comment: "scotch once a year"  . Drug Use: Yes    Special: Marijuana     Comment: 09/16/11 "smoke pot from time to time; last time 6-8 months ago"  . Sexual Activity: Not on file   Other Topics Concern  . Not on file   Social History Narrative   Lives alone   Blind           Allergies  Allergen Reactions  . Bacitracin-Polymyxin B Other (See Comments)    Polysporin allergy per opthalmologist.   . Bee Venom Anaphylaxis  . Codeine Other (See Comments)    Makes patient clear throat   . Norco [Hydrocodone-Acetaminophen] Other (See Comments)    causes frontal headaches  . Penicillins Other (See Comments)    Childhood allergy/ makes patient clear throat a lot.  . Tape Hives    "plastic tape"  . Chlorhexidine Itching and Rash     Outpatient Prescriptions Prior to Visit  Medication Sig Dispense Refill  . albuterol (PROVENTIL  HFA;VENTOLIN HFA) 108 (90 BASE) MCG/ACT inhaler Inhale 2 puffs into the lungs every 6 (six) hours as needed. For shortness of breath  1 Inhaler  6  . albuterol (PROVENTIL) (2.5 MG/3ML) 0.083% nebulizer solution Take 3 mLs (2.5 mg total) by nebulization every 4 (four) hours as needed. For shortness of breath  120 mL  6  . Calcium Carbonate-Vitamin D (CALCIUM 600+D) 600-400 MG-UNIT per tablet Take 1 tablet by mouth daily.      . diazepam (VALIUM) 10 MG tablet Take 10 mg by mouth 3 (three) times daily.       . fluticasone (FLONASE) 50 MCG/ACT nasal spray Place 2 sprays into the nose daily.  16 g  6  . Fluticasone-Salmeterol (ADVAIR DISKUS) 500-50 MCG/DOSE AEPB Inhale 1 puff into the lungs every 12 (twelve) hours.  60 each  6  . hydrOXYzine (ATARAX/VISTARIL) 25 MG tablet Take 25 mg by mouth 3 (three) times daily as needed. For itching      . ibuprofen (ADVIL,MOTRIN) 200 MG tablet Take 800 mg by mouth every 6 (six) hours as needed. For pain      . montelukast (SINGULAIR) 10 MG tablet Take 1 tablet (10 mg total) by mouth at bedtime.  30 tablet  6  . omeprazole (PRILOSEC) 40 MG capsule TAKE ONE CAPSULE BY MOUTH TWICE A DAY  60 capsule  1  . prednisoLONE acetate (PRED FORTE) 1 % ophthalmic suspension Place 1 drop into both eyes daily.       . sertraline (ZOLOFT) 100 MG tablet Take 200 mg by mouth daily.       . temazepam (RESTORIL) 15 MG capsule Take 15 mg by mouth at bedtime as needed. For sleep      . traZODone (DESYREL) 100 MG tablet Take 100 mg by mouth at bedtime as needed. For insomnia      . desoximetasone (TOPICORT) 0.05 % cream Apply 1 application topically 3 (three) times daily as needed. For itching on body      . loratadine-pseudoephedrine (CLARITIN-D 24 HOUR) 10-240 MG per 24 hr tablet Take 1 tablet by mouth daily.  30 tablet  6  . omeprazole (PRILOSEC) 20 MG capsule Take 20 mg by mouth daily as needed. Acid reflux       No facility-administered medications prior to  visit.       Review  of Systems  Constitutional:   No  weight loss, night sweats,  Fevers, chills,  +fatigue, or  lassitude.  HEENT:   No headaches,  Difficulty swallowing,  Tooth/dental problems, or  Sore throat,                No sneezing, itching, ear ache,  +nasal congestion, post nasal drip,   CV:  No chest pain,  Orthopnea, PND, swelling in lower extremities, anasarca, dizziness, palpitations, syncope.   GI  No heartburn, indigestion, abdominal pain, nausea, vomiting, diarrhea, change in bowel habits, loss of appetite, bloody stools.   Resp:  No coughing up of blood.    No chest wall deformity  Skin: no rash or lesions.  GU: no dysuria, change in color of urine, no urgency or frequency.  No flank pain, no hematuria   MS:  No joint pain or swelling.  No decreased range of motion.     Psych:  No change in mood or affect. No depression or anxiety.            Objective:   Physical Exam BP 148/82  Pulse 81  Temp(Src) 97.8 F (36.6 C) (Oral)  Ht 5\' 11"  (1.803 m)  Wt 162 lb 12.8 oz (73.846 kg)  BMI 22.72 kg/m2  SpO2 98%  GEN: A/Ox3; pleasant , NAD, elderly , w/ walking cane -blind   HEENT:  /AT,  EACs-clear, TMs-wnl, NOSE-clear drainage  THROAT-clear, no lesions, + postnasal drip or exudate noted.  Nasal purulence  NECK:  Supple w/ fair ROM; no JVD; normal carotid impulses w/o bruits; no thyromegaly or nodules palpated; no lymphadenopathy.  RESP  CTA w/ no wheeze  ,no accessory muscle use, no dullness to percussion  CARD:  RRR, no m/r/g  , no peripheral edema, pulses intact, no cyanosis or clubbing.  GI:   Soft & nt; nml bowel sounds; no organomegaly or masses detected.  Musco: Warm bil, no deformities or joint swelling noted.   Neuro: alert, no focal deficits noted.    Skin: Warm, few excoriations along arms       CXR 03/22/2013 -no acute findings    Assessment & Plan:   No problem-specific assessment & plan notes found for this encounter.   Updated Medication  List Outpatient Encounter Prescriptions as of 03/22/2013  Medication Sig  . albuterol (PROVENTIL HFA;VENTOLIN HFA) 108 (90 BASE) MCG/ACT inhaler Inhale 2 puffs into the lungs every 6 (six) hours as needed. For shortness of breath  . albuterol (PROVENTIL) (2.5 MG/3ML) 0.083% nebulizer solution Take 3 mLs (2.5 mg total) by nebulization every 4 (four) hours as needed. For shortness of breath  . Calcium Carbonate-Vitamin D (CALCIUM 600+D) 600-400 MG-UNIT per tablet Take 1 tablet by mouth daily.  . diazepam (VALIUM) 10 MG tablet Take 10 mg by mouth 3 (three) times daily.   . fluticasone (FLONASE) 50 MCG/ACT nasal spray Place 2 sprays into the nose daily.  . Fluticasone-Salmeterol (ADVAIR DISKUS) 500-50 MCG/DOSE AEPB Inhale 1 puff into the lungs every 12 (twelve) hours.  . hydrOXYzine (ATARAX/VISTARIL) 25 MG tablet Take 25 mg by mouth 3 (three) times daily as needed. For itching  . ibuprofen (ADVIL,MOTRIN) 200 MG tablet Take 800 mg by mouth every 6 (six) hours as needed. For pain  . montelukast (SINGULAIR) 10 MG tablet Take 1 tablet (10 mg total) by mouth at bedtime.  Marland Kitchen omeprazole (PRILOSEC) 40 MG capsule TAKE ONE CAPSULE BY MOUTH TWICE A DAY  .  prednisoLONE acetate (PRED FORTE) 1 % ophthalmic suspension Place 1 drop into both eyes daily.   . sertraline (ZOLOFT) 100 MG tablet Take 200 mg by mouth daily.   . temazepam (RESTORIL) 15 MG capsule Take 15 mg by mouth at bedtime as needed. For sleep  . traZODone (DESYREL) 100 MG tablet Take 100 mg by mouth at bedtime as needed. For insomnia  . levocetirizine (XYZAL) 5 MG tablet Take 1 tablet (5 mg total) by mouth every evening.  . [DISCONTINUED] desoximetasone (TOPICORT) 0.05 % cream Apply 1 application topically 3 (three) times daily as needed. For itching on body  . [DISCONTINUED] loratadine-pseudoephedrine (CLARITIN-D 24 HOUR) 10-240 MG per 24 hr tablet Take 1 tablet by mouth daily.  . [DISCONTINUED] omeprazole (PRILOSEC) 20 MG capsule Take 20 mg by mouth  daily as needed. Acid reflux

## 2013-03-22 NOTE — Assessment & Plan Note (Signed)
Compensated with mild AR flare  cxr today with no acute findings.  Add xyzal   Plan  May use Xyzal 5mg  daily for drainage.  Flu vaccine today Return 4 months with Dr. Delford Field  And As needed

## 2013-03-22 NOTE — Patient Instructions (Addendum)
May use Xyzal 5mg  daily for drainage.  Flu vaccine today Return 4 months with Dr. Delford Field  And As needed

## 2013-03-31 ENCOUNTER — Telehealth: Payer: Self-pay | Admitting: Adult Health

## 2013-03-31 NOTE — Telephone Encounter (Signed)
Per 11.4.14 ov w/ TP: Patient Instructions    May use Xyzal 5mg  daily for drainage.  Flu vaccine today  Return 4 months with Dr. Delford Field And As needed    Pt has tried and failed Zyrtec, Claritin and Allegra hence the rx for Xyzal Called the PA number on the form from the pharmacy : [facility] [phone] Spoke with Johnny Bridge and was able to get the Xyzal 5mg  #30 w/ 11 refills approved PA approval # [id number]  Called spoke with patient, advised of approved medication.  Pt verbalized his understanding.  Nothing further needed; will sign off.

## 2013-04-13 ENCOUNTER — Other Ambulatory Visit: Payer: Self-pay | Admitting: Critical Care Medicine

## 2013-04-30 ENCOUNTER — Other Ambulatory Visit: Payer: Self-pay | Admitting: Critical Care Medicine

## 2013-05-10 ENCOUNTER — Telehealth: Payer: Self-pay | Admitting: Critical Care Medicine

## 2013-05-10 MED ORDER — PREDNISONE 10 MG PO TABS: ORAL_TABLET | ORAL | Status: DC

## 2013-05-10 NOTE — Telephone Encounter (Signed)
I called and spoke with pt. He c/o wheezing, increase SOB, dry cough x 1 week. No chest tx, no fever. I offered pt appt but reports he will not have any transportation until 05/20/13 and he does not drive at all. He is requesting to have something called in. Please advise Dr. Delford Field thanks  Allergies  Allergen Reactions  . Bacitracin-Polymyxin B Other (See Comments)    Polysporin allergy per opthalmologist.   . Bee Venom Anaphylaxis  . Codeine Other (See Comments)    Makes patient clear throat   . Norco [Hydrocodone-Acetaminophen] Other (See Comments)    causes frontal headaches  . Penicillins Other (See Comments)    Childhood allergy/ makes patient clear throat a lot.  . Tape Hives    "plastic tape"  . Chlorhexidine Itching and Rash

## 2013-05-10 NOTE — Telephone Encounter (Signed)
Call in prednisone 10mg :  Take 4 for three days 3 for three days 2 for three days 1 for three days and stop #30 

## 2013-05-10 NOTE — Telephone Encounter (Signed)
Pt aware of recs. rx sent 

## 2013-05-24 ENCOUNTER — Telehealth: Payer: Self-pay | Admitting: Critical Care Medicine

## 2013-05-24 NOTE — Telephone Encounter (Signed)
Spoke with pt. Appt scheduled for 4 pm tomorrow afternoon with MW. Nothing further needed

## 2013-05-25 ENCOUNTER — Encounter: Payer: Self-pay | Admitting: Internal Medicine

## 2013-05-25 ENCOUNTER — Encounter: Payer: Self-pay | Admitting: *Deleted

## 2013-05-25 ENCOUNTER — Ambulatory Visit (INDEPENDENT_AMBULATORY_CARE_PROVIDER_SITE_OTHER): Payer: Medicaid Other | Admitting: Internal Medicine

## 2013-05-25 VITALS — BP 160/100 | HR 87 | Temp 97.8°F | Ht 71.0 in | Wt 160.0 lb

## 2013-05-25 DIAGNOSIS — R0602 Shortness of breath: Secondary | ICD-10-CM

## 2013-05-25 DIAGNOSIS — J45909 Unspecified asthma, uncomplicated: Secondary | ICD-10-CM

## 2013-05-25 MED ORDER — METHYLPREDNISOLONE ACETATE 80 MG/ML IJ SUSP
80.0000 mg | Freq: Once | INTRAMUSCULAR | Status: AC
  Administered 2013-05-25: 80 mg via INTRAMUSCULAR

## 2013-05-25 MED ORDER — PREDNISONE 10 MG PO TABS: ORAL_TABLET | ORAL | Status: DC

## 2013-05-25 MED ORDER — MOMETASONE FURO-FORMOTEROL FUM 200-5 MCG/ACT IN AERO
INHALATION_SPRAY | RESPIRATORY_TRACT | Status: DC
Start: 2013-05-25 — End: 2013-08-05

## 2013-05-25 NOTE — Patient Instructions (Addendum)
Prednisone 10 Take 4 for three days 3 for three days 2 for three days 1 for three days and stop   Stop advair for now as it is no longer effective and may be working against you.   Dulera 200 Take 2 puffs first thing in am and then another 2 puffs about 12 hours later on a trial basis   Omeprazole 40 mg Take 30- 60 min before your first and last meals of the day

## 2013-05-25 NOTE — Progress Notes (Signed)
Subjective:    Patient ID: [name] [name], male    DOB: June 26, 1950, 63 y.o.   MRN: [id number]  HPI  63 y.o.  with   severe atopic asthma.   06/04/11 Acute OV  Complains of cough, congestion , wheezing for 1 week.  Has thick mucus and barking cough . Wheezing is getting worse. Needs samples is out of money to buy rx  Until 06/20/11.  No chest pain or edema.  OTC not helping.   02/11/2012 Doing well. No new issues.  No cough. No SABA use. Not seen since January 2013  06/30/2012 Pt was seen about one week ago.  Rx pred and Depo. No ABX .PT improved but then worse again. SYmptoms now chest tightness, more wheezing, notes more sinus issues. >>omnicef x 10  And steroid taper  08/03/12 follow up  Returns for 1 month follow up . Seen 1 month ago for asthma and sinusitis flare . Tx w/ Omnicef and steroid taper.  He is feeling much better. Cough and congestion are decreased . No fever or hemotpysis .  Says the steroids really helped him all over. Skin is better as well.  03/22/2013 Follow up  Returns for follow up for asthma.  Says he is doing about the same.  Does have SOB, wheezing, prod cough with clear/white mucus at mainly at night x1-87months. Wants something to control drainage.  rec May use Xyzal 5mg  daily for drainage.  Flu vaccine today   05/10/13 phone call re flare of cough/sob   prednisone 10mg  Take 4 for three days 3 for three days 2 for three days 1 for three days and stop #30    05/25/2013 f/u ov/[name] [name] re: flare of ab since around 05/07/13  Chief Complaint  Patient presents with  . Acute Visit    Pt c/o increased SOB and cough for a little over a wk. He is using albuterol neb 8-10 times per day. Cough is non prod.   not taking ppi at all at this point, assoc with hoarseness, sob with anything more than very slow adls and not really much  Better p neb or prednisone or advair  No obvious pattern in  day to day or daytime variabilty or assoc excess mucus production  or cp or  chest tightness, subjective wheeze overt sinus or hb symptoms. No unusual exp hx or h/o childhood pna/ asthma or knowledge of premature birth.    Also denies any obvious fluctuation of symptoms with weather or environmental changes or other aggravating or alleviating factors except as outlined above   Current Medications, Allergies, Complete Past Medical History, Past Surgical History, Family History, and Social History were reviewed in [employer] record.  ROS  The following are not active complaints unless bolded sore throat, dysphagia, dental problems, itching, sneezing,  nasal congestion or excess/ purulent secretions, ear ache,   fever, chills, sweats, unintended wt loss, pleuritic or exertional cp, hemoptysis,  orthopnea pnd or leg swelling, presyncope, palpitations, heartburn, abdominal pain, anorexia, nausea, vomiting, diarrhea  or change in bowel or urinary habits, change in stools or urine, dysuria,hematuria,  rash, arthralgias, visual complaints, headache, numbness weakness or ataxia or problems with walking or coordination,  change in mood/affect or memory.        Past Medical History  Diagnosis Date  . 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 Cough   . Esophageal reflux   . Osteopenia   . Personal history  of other diseases of digestive system   . Contact dermatitis and other eczema, due to unspecified cause   . Unspecified sinusitis (chronic)   . Allergic rhinitis   . Unspecified asthma(493.90)   . COPD (chronic obstructive pulmonary disease)   . Hypertension   . Seasonal allergies   . Ulcerative colitis   . H/O hiatal hernia   . Arthritis     spine  . Anxiety   . Multiple open wounds     "from esczema- scratching"  . Multiple facial bone fractures   . 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 History of bronchitis   . Pneumonia     in past had several times  . Shortness of breath     "sometime"  . Chronic  lower back pain   . Kidney stones   . Depression     patient constantly hitting left side of face "due to pain"  . Pancreatitis      Family History  Problem Relation Age of Onset  . Allergies Mother     atopic  . Asthma Mother   . Heart failure Mother     CHF  . Other Father   . Anesthesia problems Neg Hx      History   Social History  . Marital Status: Single    Spouse Name: N/A    Number of Children: 1  . Years of Education: N/A   Occupational History  . does not work    Social History Main Topics  . Smoking status: Former Smoker -- 1.00 packs/day for 3 years    Types: Cigarettes, Pipe    Quit date: 05/20/1983  . Smokeless tobacco: Never Used  . Alcohol Use: Yes     Comment: "scotch once a year"  . Drug Use: Yes    Special: Marijuana     Comment: 09/16/11 "smoke pot from time to time; last time 6-8 months ago"  . Sexual Activity: Not on file   Other Topics Concern  . Not on file   Social History Narrative   Lives alone   Blind           Allergies  Allergen Reactions  . Bacitracin-Polymyxin B Other (See Comments)    Polysporin allergy per opthalmologist.   . Bee Venom Anaphylaxis  . Codeine Other (See Comments)    Makes patient clear throat   . Norco [Hydrocodone-Acetaminophen] Other (See Comments)    causes frontal headaches  . Penicillins Other (See Comments)    Childhood allergy/ makes patient clear throat a lot.  . Tape Hives    "plastic tape"  . Chlorhexidine Itching and Rash     Outpatient Prescriptions Prior to Visit  Medication Sig Dispense Refill  . albuterol (PROVENTIL HFA;VENTOLIN HFA) 108 (90 BASE) MCG/ACT inhaler Inhale 2 puffs into the lungs every 6 (six) hours as needed. For shortness of breath  1 Inhaler  6  . albuterol (PROVENTIL) (2.5 MG/3ML) 0.083% nebulizer solution Take 3 mLs (2.5 mg total) by nebulization every 4 (four) hours as needed. For shortness of breath  120 mL  6  . Calcium Carbonate-Vitamin D (CALCIUM 600+D) 600-400  MG-UNIT per tablet Take 1 tablet by mouth daily.      . diazepam (VALIUM) 10 MG tablet Take 10 mg by mouth 3 (three) times daily.       . fluticasone (FLONASE) 50 MCG/ACT nasal spray Place 2 sprays into the nose daily.  16 g  6  .  Fluticasone-Salmeterol (ADVAIR DISKUS) 500-50 MCG/DOSE AEPB Inhale 1 puff into the lungs every 12 (twelve) hours.  60 each  6  . hydrOXYzine (ATARAX/VISTARIL) 25 MG tablet Take 25 mg by mouth 3 (three) times daily as needed. For itching      . ibuprofen (ADVIL,MOTRIN) 200 MG tablet Take 800 mg by mouth every 6 (six) hours as needed. For pain      . montelukast (SINGULAIR) 10 MG tablet Take 1 tablet (10 mg total) by mouth at bedtime.  30 tablet  6  . omeprazole (PRILOSEC) 40 MG capsule TAKE ONE CAPSULE BY MOUTH TWICE A DAY  60 capsule  1  . prednisoLONE acetate (PRED FORTE) 1 % ophthalmic suspension Place 1 drop into both eyes daily.       . sertraline (ZOLOFT) 100 MG tablet Take 200 mg by mouth daily.       . temazepam (RESTORIL) 15 MG capsule Take 15 mg by mouth at bedtime as needed. For sleep      . traZODone (DESYREL) 100 MG tablet Take 100 mg by mouth at bedtime as needed. For insomnia      . desoximetasone (TOPICORT) 0.05 % cream Apply 1 application topically 3 (three) times daily as needed. For itching on body      . loratadine-pseudoephedrine (CLARITIN-D 24 HOUR) 10-240 MG per 24 hr tablet Take 1 tablet by mouth daily.  30 tablet  6  . omeprazole (PRILOSEC) 20 MG capsule Take 20 mg by mouth daily as needed. Acid reflux       No facility-administered medications prior to visit.              Objective:   Physical Exam BP 148/82  Pulse 81  Temp(Src) 97.8 F (36.6 C) (Oral)  Ht 5\' 11"  (1.803 m)  Wt 162 lb 12.8 oz (73.846 kg)  BMI 22.72 kg/m2  SpO2 98%  GEN: A/Ox3; pleasant , NAD, elderly , w/ walking cane -blind / prominent pseudowheeze/hoarseness  HEENT:  [place]/AT,  EACs-clear, TMs-wnl, NOSE-clear drainage  THROAT-clear, no lesions, + postnasal drip  or exudate noted.  Nasal purulence  NECK:  Supple w/ fair ROM; no JVD; normal carotid impulses w/o bruits; no thyromegaly or nodules palpated; no lymphadenopathy.  RESP  CTA w/ no wheeze  ,no accessory muscle use, no dullness to percussion  CARD:  RRR, no m/r/g  , no peripheral edema, pulses intact, no cyanosis or clubbing.  GI:   Soft & nt; nml bowel sounds; no organomegaly or masses detected.  Musco: Warm bil, no deformities or joint swelling noted.   Neuro: alert, no focal deficits noted.    Skin: Warm, few excoriations along arms       CXR 03/22/2013 -no acute findings    Assessment & Plan:

## 2013-05-27 NOTE — Assessment & Plan Note (Signed)
DDX of  difficult airways managment all start with A and  include Adherence, Ace Inhibitors, Acid Reflux, Active Sinus Disease, Alpha 1 Antitripsin deficiency, Anxiety masquerading as Airways dz,  ABPA,  allergy(esp in young), Aspiration (esp in elderly), Adverse effects of DPI,  Active smokers, plus two Bs  = Bronchiectasis and Beta blocker use..and one C= CHF   Adherence is always the initial "prime suspect" and is a multilayered concern that requires a "trust but verify" approach in every patient - starting with knowing how to use medications, especially inhalers, correctly, keeping up with refills and understanding the fundamental difference between maintenance and prns vs those medications only taken for a very short course and then stopped and not refilled.  - The proper method of use, as well as anticipated side effects, of a metered-dose inhaler are discussed and demonstrated to the patient. Improved effectiveness after extensive coaching during this visit to a level of approximately  75% so try dulera 200 2bid sample and regroup in 2 weeks  ? Acid (or non-acid) GERD > always difficult to exclude as up to 75% of pts in some series report no assoc GI/ Heartburn symptoms> rec max (24h)  acid suppression and diet restrictions/ reviewed and instructions given in writing.   ? Adverse effects of advair on upper airway > pseudowheeze  ? Anxiety > dx of exclusion     Each maintenance medication was reviewed in detail including most importantly the difference between maintenance and as needed and under what circumstances the prns are to be used.  Please see instructions for details which were reviewed in writing and the patient given a copy.

## 2013-06-10 ENCOUNTER — Encounter (HOSPITAL_BASED_OUTPATIENT_CLINIC_OR_DEPARTMENT_OTHER): Payer: Self-pay | Admitting: *Deleted

## 2013-06-10 NOTE — Progress Notes (Signed)
Pt lives alone-legally blind-has a case worker-[phone] To bring all meds and inhalers and overnight bag-transportation to bring him-staying [facility]

## 2013-06-13 ENCOUNTER — Other Ambulatory Visit: Payer: Self-pay | Admitting: Orthopedic Surgery

## 2013-06-15 ENCOUNTER — Encounter (HOSPITAL_BASED_OUTPATIENT_CLINIC_OR_DEPARTMENT_OTHER): Payer: Self-pay | Admitting: Orthopedic Surgery

## 2013-06-15 ENCOUNTER — Encounter (HOSPITAL_BASED_OUTPATIENT_CLINIC_OR_DEPARTMENT_OTHER)
Admission: RE | Disposition: A | Discharge status: Home / Self Care | Payer: Self-pay | Source: Ambulatory Visit | Attending: Orthopedic Surgery

## 2013-06-15 ENCOUNTER — Ambulatory Visit (HOSPITAL_BASED_OUTPATIENT_CLINIC_OR_DEPARTMENT_OTHER): Payer: Medicaid Other | Admitting: Certified Registered"

## 2013-06-15 ENCOUNTER — Ambulatory Visit (HOSPITAL_BASED_OUTPATIENT_CLINIC_OR_DEPARTMENT_OTHER)
Admission: RE | Admit: 2013-06-15 | Discharge: 2013-06-16 | Disposition: A | Discharge status: Home / Self Care | Payer: Medicaid Other | Source: Ambulatory Visit | Attending: Orthopedic Surgery | Admitting: Orthopedic Surgery

## 2013-06-15 ENCOUNTER — Encounter (HOSPITAL_BASED_OUTPATIENT_CLINIC_OR_DEPARTMENT_OTHER): Payer: Medicaid Other | Admitting: Certified Registered"

## 2013-06-15 DIAGNOSIS — F411 Generalized anxiety disorder: Secondary | ICD-10-CM | POA: Insufficient documentation

## 2013-06-15 DIAGNOSIS — F329 Major depressive disorder, single episode, unspecified: Secondary | ICD-10-CM | POA: Insufficient documentation

## 2013-06-15 DIAGNOSIS — S5290XA Unspecified fracture of unspecified forearm, initial encounter for closed fracture: Secondary | ICD-10-CM | POA: Diagnosis present

## 2013-06-15 DIAGNOSIS — IMO0002 Reserved for concepts with insufficient information to code with codable children: Secondary | ICD-10-CM | POA: Insufficient documentation

## 2013-06-15 DIAGNOSIS — J449 Chronic obstructive pulmonary disease, unspecified: Secondary | ICD-10-CM | POA: Insufficient documentation

## 2013-06-15 DIAGNOSIS — M24539 Contracture, unspecified wrist: Secondary | ICD-10-CM | POA: Insufficient documentation

## 2013-06-15 DIAGNOSIS — F3289 Other specified depressive episodes: Secondary | ICD-10-CM | POA: Insufficient documentation

## 2013-06-15 DIAGNOSIS — J4489 Other specified chronic obstructive pulmonary disease: Secondary | ICD-10-CM | POA: Insufficient documentation

## 2013-06-15 DIAGNOSIS — K219 Gastro-esophageal reflux disease without esophagitis: Secondary | ICD-10-CM | POA: Insufficient documentation

## 2013-06-15 DIAGNOSIS — G5600 Carpal tunnel syndrome, unspecified upper limb: Secondary | ICD-10-CM | POA: Insufficient documentation

## 2013-06-15 HISTORY — PX: CARPAL TUNNEL RELEASE: SHX101

## 2013-06-15 HISTORY — PX: ORIF WRIST FRACTURE: SHX2133

## 2013-06-15 LAB — POCT HEMOGLOBIN-HEMACUE: Hemoglobin: 16.1 g/dL (ref 13.0–17.0)

## 2013-06-15 SURGERY — OPEN REDUCTION INTERNAL FIXATION (ORIF) WRIST FRACTURE
Anesthesia: Regional | Site: Wrist | Laterality: Right

## 2013-06-15 MED ORDER — ONDANSETRON HCL 4 MG/2ML IJ SOLN
4.0000 mg | Freq: Four times a day (QID) | INTRAMUSCULAR | Status: DC | PRN
Start: 2013-06-15 — End: 2013-06-16

## 2013-06-15 MED ORDER — SERTRALINE HCL 100 MG PO TABS
200.0000 mg | ORAL_TABLET | Freq: Every day | ORAL | Status: DC
Start: 2013-06-15 — End: 2013-06-16

## 2013-06-15 MED ORDER — FENTANYL CITRATE 0.05 MG/ML IJ SOLN
INTRAMUSCULAR | Status: AC
  Filled 2013-06-15: qty 6

## 2013-06-15 MED ORDER — VANCOMYCIN HCL IN DEXTROSE 500-5 MG/100ML-% IV SOLN
INTRAVENOUS | Status: AC
  Filled 2013-06-15: qty 200

## 2013-06-15 MED ORDER — FENTANYL CITRATE 0.05 MG/ML IJ SOLN
50.0000 ug | INTRAMUSCULAR | Status: DC | PRN
  Administered 2013-06-15: 100 ug via INTRAVENOUS

## 2013-06-15 MED ORDER — DEXAMETHASONE SODIUM PHOSPHATE 10 MG/ML IJ SOLN
INTRAMUSCULAR | Status: DC | PRN
  Administered 2013-06-15: 4 mg via INTRAVENOUS

## 2013-06-15 MED ORDER — DIPHENHYDRAMINE HCL 50 MG/ML IJ SOLN
INTRAMUSCULAR | Status: DC | PRN
  Administered 2013-06-15: 12.5 mg via INTRAVENOUS

## 2013-06-15 MED ORDER — ONDANSETRON HCL 4 MG PO TABS: 4.0000 mg | ORAL_TABLET | Freq: Four times a day (QID) | ORAL | Status: DC | PRN

## 2013-06-15 MED ORDER — PROMETHAZINE HCL 25 MG/ML IJ SOLN: 6.2500 mg | INTRAMUSCULAR | Status: DC | PRN

## 2013-06-15 MED ORDER — FENTANYL CITRATE 0.05 MG/ML IJ SOLN
INTRAMUSCULAR | Status: AC
  Filled 2013-06-15: qty 2

## 2013-06-15 MED ORDER — ONDANSETRON HCL 4 MG/2ML IJ SOLN
INTRAMUSCULAR | Status: DC | PRN
  Administered 2013-06-15: 4 mg via INTRAVENOUS

## 2013-06-15 MED ORDER — LACTATED RINGERS IV SOLN
INTRAVENOUS | Status: DC
  Administered 2013-06-15: 50 mL/h via INTRAVENOUS

## 2013-06-15 MED ORDER — MIDAZOLAM HCL 2 MG/2ML IJ SOLN
INTRAMUSCULAR | Status: AC
  Filled 2013-06-15: qty 2

## 2013-06-15 MED ORDER — MIDAZOLAM HCL 5 MG/5ML IJ SOLN
INTRAMUSCULAR | Status: DC | PRN
  Administered 2013-06-15: 1 mg via INTRAVENOUS

## 2013-06-15 MED ORDER — PANTOPRAZOLE SODIUM 40 MG PO TBEC
40.0000 mg | DELAYED_RELEASE_TABLET | Freq: Every day | ORAL | Status: DC
  Administered 2013-06-15: 40 mg via ORAL

## 2013-06-15 MED ORDER — BUPIVACAINE-EPINEPHRINE PF 0.5-1:200000 % IJ SOLN
INTRAMUSCULAR | Status: DC | PRN
  Administered 2013-06-15: 30 mL via PERINEURAL

## 2013-06-15 MED ORDER — LIDOCAINE HCL (CARDIAC) 20 MG/ML IV SOLN
INTRAVENOUS | Status: DC | PRN
  Administered 2013-06-15: 30 mg via INTRAVENOUS

## 2013-06-15 MED ORDER — OXYCODONE HCL 5 MG/5ML PO SOLN: 5.0000 mg | Freq: Once | ORAL | Status: DC | PRN

## 2013-06-15 MED ORDER — EPHEDRINE SULFATE 50 MG/ML IJ SOLN
INTRAMUSCULAR | Status: DC | PRN
  Administered 2013-06-15 (×2): 10 mg via INTRAVENOUS

## 2013-06-15 MED ORDER — VANCOMYCIN HCL IN DEXTROSE 1-5 GM/200ML-% IV SOLN
1000.0000 mg | INTRAVENOUS | Status: AC
  Administered 2013-06-15: 1000 mg via INTRAVENOUS

## 2013-06-15 MED ORDER — OXYCODONE HCL 5 MG PO TABS: 5.0000 mg | ORAL_TABLET | Freq: Once | ORAL | Status: DC | PRN

## 2013-06-15 MED ORDER — PROPOFOL 10 MG/ML IV BOLUS
INTRAVENOUS | Status: DC | PRN
  Administered 2013-06-15: 150 mg via INTRAVENOUS

## 2013-06-15 MED ORDER — OXYCODONE-ACETAMINOPHEN 5-325 MG PO TABS: 1.0000 | ORAL_TABLET | ORAL | Status: DC | PRN

## 2013-06-15 MED ORDER — VANCOMYCIN HCL IN DEXTROSE 1-5 GM/200ML-% IV SOLN
1000.0000 mg | Freq: Two times a day (BID) | INTRAVENOUS | Status: AC
  Administered 2013-06-15: 1000 mg via INTRAVENOUS

## 2013-06-15 MED ORDER — MIDAZOLAM HCL 2 MG/2ML IJ SOLN
1.0000 mg | INTRAMUSCULAR | Status: DC | PRN
  Administered 2013-06-15: 2 mg via INTRAVENOUS

## 2013-06-15 MED ORDER — LACTATED RINGERS IV SOLN
INTRAVENOUS | Status: DC
  Administered 2013-06-15 (×2): via INTRAVENOUS

## 2013-06-15 MED ORDER — VANCOMYCIN HCL IN DEXTROSE 1-5 GM/200ML-% IV SOLN
INTRAVENOUS | Status: AC
  Filled 2013-06-15: qty 200

## 2013-06-15 MED ORDER — HYDROMORPHONE HCL PF 1 MG/ML IJ SOLN: 0.2500 mg | INTRAMUSCULAR | Status: DC | PRN

## 2013-06-15 MED ORDER — METOCLOPRAMIDE HCL 5 MG/ML IJ SOLN: 5.0000 mg | Freq: Three times a day (TID) | INTRAMUSCULAR | Status: DC | PRN

## 2013-06-15 MED ORDER — MONTELUKAST SODIUM 10 MG PO TABS: 10.0000 mg | ORAL_TABLET | Freq: Every day | ORAL | Status: DC

## 2013-06-15 MED ORDER — OXYCODONE HCL 5 MG PO TABS
5.0000 mg | ORAL_TABLET | ORAL | Status: DC | PRN
  Administered 2013-06-15 – 2013-06-16 (×5): 10 mg via ORAL
  Filled 2013-06-15 (×5): qty 2

## 2013-06-15 MED ORDER — BUPIVACAINE HCL (PF) 0.25 % IJ SOLN
INTRAMUSCULAR | Status: AC
  Filled 2013-06-15: qty 360

## 2013-06-15 MED ORDER — PREDNISOLONE ACETATE 1 % OP SUSP
1.0000 [drp] | Freq: Every day | OPHTHALMIC | Status: DC
  Administered 2013-06-15: 1 [drp] via OPHTHALMIC

## 2013-06-15 MED ORDER — MOMETASONE FURO-FORMOTEROL FUM 200-5 MCG/ACT IN AERO
2.0000 | INHALATION_SPRAY | Freq: Two times a day (BID) | RESPIRATORY_TRACT | Status: DC
Start: 2013-06-15 — End: 2013-06-16
  Administered 2013-06-15: 2 via RESPIRATORY_TRACT

## 2013-06-15 MED ORDER — METOCLOPRAMIDE HCL 5 MG PO TABS: 5.0000 mg | ORAL_TABLET | Freq: Three times a day (TID) | ORAL | Status: DC | PRN

## 2013-06-15 MED ORDER — HYDROMORPHONE HCL PF 1 MG/ML IJ SOLN
0.5000 mg | INTRAMUSCULAR | Status: DC | PRN
  Administered 2013-06-16 (×4): 1 mg via INTRAVENOUS
  Filled 2013-06-15 (×4): qty 1

## 2013-06-15 MED ORDER — DIAZEPAM 5 MG PO TABS
10.0000 mg | ORAL_TABLET | Freq: Three times a day (TID) | ORAL | Status: DC
  Administered 2013-06-15: 10 mg via ORAL

## 2013-06-15 SURGICAL SUPPLY — 90 items
APL SKNCLS STERI-STRIP NONHPOA (GAUZE/BANDAGES/DRESSINGS)
BAG DECANTER FOR FLEXI CONT (MISCELLANEOUS) IMPLANT
BANDAGE ELASTIC 3 VELCRO ST LF (GAUZE/BANDAGES/DRESSINGS) ×3 IMPLANT
BANDAGE ELASTIC 4 VELCRO ST LF (GAUZE/BANDAGES/DRESSINGS) ×3 IMPLANT
BENZOIN TINCTURE PRP APPL 2/3 (GAUZE/BANDAGES/DRESSINGS) IMPLANT
BIT DRILL SOLID 2.5X110 (BIT) ×3 IMPLANT
BLADE AVERAGE 25MMX9MM (BLADE) ×1
BLADE AVERAGE 25X9 (BLADE) ×2 IMPLANT
BLADE MINI RND TIP GREEN BEAV (BLADE) ×3 IMPLANT
BLADE SURG 15 STRL LF DISP TIS (BLADE) ×2 IMPLANT
BLADE SURG 15 STRL SS (BLADE) ×6
BNDG CMPR 9X4 STRL LF SNTH (GAUZE/BANDAGES/DRESSINGS) ×1
BNDG ESMARK 4X9 LF (GAUZE/BANDAGES/DRESSINGS) ×3 IMPLANT
BNDG GAUZE ELAST 4 BULKY (GAUZE/BANDAGES/DRESSINGS) ×3 IMPLANT
CANISTER SUCT 1200ML W/VALVE (MISCELLANEOUS) IMPLANT
CLOSURE WOUND 1/2 X4 (GAUZE/BANDAGES/DRESSINGS)
CORDS BIPOLAR (ELECTRODE) ×3 IMPLANT
COVER TABLE BACK 60X90 (DRAPES) ×6 IMPLANT
CUFF TOURNIQUET SINGLE 18IN (TOURNIQUET CUFF) ×3 IMPLANT
DECANTER SPIKE VIAL GLASS SM (MISCELLANEOUS) IMPLANT
DRAPE EXTREMITY T 121X128X90 (DRAPE) ×3 IMPLANT
DRAPE OEC MINIVIEW 54X84 (DRAPES) ×6 IMPLANT
DRAPE SURG 17X23 STRL (DRAPES) ×3 IMPLANT
DURAPREP 26ML APPLICATOR (WOUND CARE) ×3 IMPLANT
ELECT REM PT RETURN 9FT ADLT (ELECTROSURGICAL)
ELECTRODE REM PT RTRN 9FT ADLT (ELECTROSURGICAL) IMPLANT
GAUZE SPONGE 4X4 16PLY XRAY LF (GAUZE/BANDAGES/DRESSINGS) IMPLANT
GAUZE XEROFORM 1X8 LF (GAUZE/BANDAGES/DRESSINGS) ×6 IMPLANT
GLOVE BIOGEL PI IND STRL 7.0 (GLOVE) ×1 IMPLANT
GLOVE BIOGEL PI IND STRL 8.5 (GLOVE) ×1 IMPLANT
GLOVE BIOGEL PI INDICATOR 7.0 (GLOVE) ×2
GLOVE BIOGEL PI INDICATOR 8.5 (GLOVE) ×2
GLOVE ECLIPSE 6.5 STRL STRAW (GLOVE) ×3 IMPLANT
GLOVE SURG ORTHO 8.0 STRL STRW (GLOVE) ×3 IMPLANT
GLOVE SURG SYN 8.0 (GLOVE) ×3 IMPLANT
GOWN STRL REUS W/ TWL LRG LVL3 (GOWN DISPOSABLE) ×1 IMPLANT
GOWN STRL REUS W/TWL LRG LVL3 (GOWN DISPOSABLE) ×3
GOWN STRL REUS W/TWL XL LVL3 (GOWN DISPOSABLE) ×6 IMPLANT
K-WIRE 1.6X150 (WIRE) ×6
KWIRE 1.6X150 (WIRE) ×2 IMPLANT
MARKER SKIN DUAL TIP RULER LAB (MISCELLANEOUS) ×3 IMPLANT
NEEDLE HYPO 22GX1.5 SAFETY (NEEDLE) ×3 IMPLANT
NEEDLE HYPO 25X1 1.5 SAFETY (NEEDLE) ×3 IMPLANT
NS IRRIG 1000ML POUR BTL (IV SOLUTION) ×3 IMPLANT
PACK BASIN DAY SURGERY FS (CUSTOM PROCEDURE TRAY) ×3 IMPLANT
PAD CAST 3X4 CTTN HI CHSV (CAST SUPPLIES) ×1 IMPLANT
PAD CAST 4YDX4 CTTN HI CHSV (CAST SUPPLIES) ×1 IMPLANT
PADDING CAST ABS 4INX4YD NS (CAST SUPPLIES) ×2
PADDING CAST ABS COTTON 4X4 ST (CAST SUPPLIES) ×1 IMPLANT
PADDING CAST COTTON 3X4 STRL (CAST SUPPLIES) ×3
PADDING CAST COTTON 4X4 STRL (CAST SUPPLIES) ×3
PENCIL BUTTON HOLSTER BLD 10FT (ELECTRODE) IMPLANT
PROS SM T PL OB 3X3X53M (Orthopedic Implant) ×3 IMPLANT
PROSTHESIS SM T PL OB 3X3X53M (Orthopedic Implant) ×1 IMPLANT
SCREW CORTEX 3.5 12MM (Screw) ×2 IMPLANT
SCREW CORTEX 3.5 14MM (Screw) ×2 IMPLANT
SCREW CORTEX 3.5 16MM (Screw) ×2 IMPLANT
SCREW CORTEX 3.5 18MM (Screw) ×2 IMPLANT
SCREW CORTEX 3.5 24MM (Screw) ×4 IMPLANT
SCREW LOCK CORT ST 3.5X12 (Screw) ×1 IMPLANT
SCREW LOCK CORT ST 3.5X14 (Screw) ×1 IMPLANT
SCREW LOCK CORT ST 3.5X16 (Screw) ×1 IMPLANT
SCREW LOCK CORT ST 3.5X18 (Screw) ×1 IMPLANT
SCREW LOCK CORT ST 3.5X24 (Screw) ×2 IMPLANT
SHEET MEDIUM DRAPE 40X70 STRL (DRAPES) ×6 IMPLANT
SPLINT FAST PLASTER 5X30 (CAST SUPPLIES) ×30
SPLINT PLASTER CAST FAST 5X30 (CAST SUPPLIES) ×15 IMPLANT
SPLINT PLASTER CAST XFAST 3X15 (CAST SUPPLIES) IMPLANT
SPLINT PLASTER CAST XFAST 4X15 (CAST SUPPLIES) IMPLANT
SPLINT PLASTER XTRA FAST SET 4 (CAST SUPPLIES)
SPLINT PLASTER XTRA FASTSET 3X (CAST SUPPLIES)
SPONGE GAUZE 4X4 12PLY (GAUZE/BANDAGES/DRESSINGS) ×3 IMPLANT
STOCKINETTE 4X48 STRL (DRAPES) ×3 IMPLANT
STRIP CLOSURE SKIN 1/2X4 (GAUZE/BANDAGES/DRESSINGS) IMPLANT
SUCTION FRAZIER TIP 10 FR DISP (SUCTIONS) ×3 IMPLANT
SUT ETHILON 4 0 PS 2 18 (SUTURE) IMPLANT
SUT FIBERWIRE 3-0 18 TAPR NDL (SUTURE) ×3
SUT MERSILENE 4 0 P 3 (SUTURE) IMPLANT
SUT PROLENE 3 0 PS 2 (SUTURE) IMPLANT
SUT SILK 2 0 FS (SUTURE) IMPLANT
SUT VIC AB 2-0 PS2 27 (SUTURE) ×3 IMPLANT
SUT VIC AB 3-0 FS2 27 (SUTURE) IMPLANT
SUT VICRYL RAPIDE 4/0 PS 2 (SUTURE) ×9 IMPLANT
SUTURE FIBERWR 3-0 18 TAPR NDL (SUTURE) ×1 IMPLANT
SYR BULB 3OZ (MISCELLANEOUS) ×3 IMPLANT
SYRINGE 10CC LL (SYRINGE) IMPLANT
TOWEL OR 17X24 6PK STRL BLUE (TOWEL DISPOSABLE) ×9 IMPLANT
TUBE CONNECTING 20'X1/4 (TUBING) ×1
TUBE CONNECTING 20X1/4 (TUBING) ×2 IMPLANT
UNDERPAD 30X30 INCONTINENT (UNDERPADS AND DIAPERS) ×3 IMPLANT

## 2013-06-15 NOTE — Anesthesia Procedure Notes (Addendum)
Anesthesia Regional Block:  Supraclavicular block  Pre-Anesthetic Checklist: ,, timeout performed, Correct Patient, Correct Site, Correct Laterality, Correct Procedure, Correct Position, site marked, Risks and benefits discussed,  Surgical consent,  Pre-op evaluation,  At surgeon's request and post-op pain management  Laterality: Right and Upper  Prep: chloraprep       Needles:   Needle Type: Echogenic Needle      Needle Gauge: 22 and 22 G  Needle insertion depth: 4 cm   Additional Needles:  Procedures: ultrasound guided (picture in chart) Supraclavicular block Narrative:  Start time: 06/15/2013 9:14 AM End time: 06/15/2013 9:17 AM Injection made incrementally with aspirations every 5 mL.  Performed by: Personally  Anesthesiologist: T Massagee  Additional Notes: Monitors on , sedation begun, tolerated well   Procedure Name: LMA Insertion Date/Time: 06/15/2013 9:33 AM Performed by: Milliana Reddoch Pre-anesthesia Checklist: Patient identified, Emergency Drugs available, Suction available and Patient being monitored Patient Re-evaluated:Patient Re-evaluated prior to inductionOxygen Delivery Method: Circle System Utilized Preoxygenation: Pre-oxygenation with 100% oxygen Intubation Type: IV induction Ventilation: Mask ventilation without difficulty LMA: LMA inserted LMA Size: 5.0 Number of attempts: 1 Placement Confirmation: positive ETCO2 Tube secured with: Tape Dental Injury: Teeth and Oropharynx as per pre-operative assessment

## 2013-06-15 NOTE — Transfer of Care (Signed)
Immediate Anesthesia Transfer of Care Note  Patient: Alfonso PattenRoger D Lubin  Procedure(s) Performed: Procedure(s): RIGHT RADIUS PSTEOTOMY, DISTAL ULNA RESECTION, AND DIGITAL FLEXOR LENGTHENING AS NEEDED (Right) RIGHT CARPAL TUNNEL RELEASE (Right)  Patient Location: PACU  Anesthesia Type:GA combined with regional for post-op pain  Level of Consciousness: awake and patient cooperative  Airway & Oxygen Therapy: Patient Spontanous Breathing and Patient connected to face mask oxygen  Post-op Assessment: Report given to PACU RN and Post -op Vital signs reviewed and stable  Post vital signs: Reviewed and stable  Complications: No apparent anesthesia complications

## 2013-06-15 NOTE — Anesthesia Postprocedure Evaluation (Signed)
  Anesthesia Post-op Note  Patient: [name] PattenRoger D Jaime  Procedure(s) Performed: Procedure(s): RIGHT RADIUS PSTEOTOMY, DISTAL ULNA RESECTION, AND DIGITAL FLEXOR LENGTHENING AS NEEDED (Right) RIGHT CARPAL TUNNEL RELEASE (Right)  Patient Location: PACU  Anesthesia Type:GA combined with regional for post-op pain  Level of Consciousness: awake and alert   Airway and Oxygen Therapy: Patient Spontanous Breathing  Post-op Pain: none  Post-op Assessment: Post-op Vital signs reviewed  Post-op Vital Signs: stable  Complications: No apparent anesthesia complications

## 2013-06-15 NOTE — Progress Notes (Signed)
Assisted Dr. Massagee with right, ultrasound guided, interscalene  block. Side rails up, monitors on throughout procedure. See vital signs in flow sheet. Tolerated Procedure well. 

## 2013-06-15 NOTE — H&P (Signed)
[name] [name] is an 63 y.o. male.   Chief Complaint: right wrist pain HPI: as above s/p failed ORIF right distal radius fracture with painful malunion  Past Medical History  Diagnosis Date  . 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 Cough   . Esophageal reflux   . Osteopenia   . Personal history of other diseases of digestive system   . Contact dermatitis and other eczema, due to unspecified cause   . Unspecified sinusitis (chronic)   . Allergic rhinitis   . Unspecified asthma(493.90)   . COPD (chronic obstructive pulmonary disease)   . Hypertension   . Seasonal allergies   . Ulcerative colitis   . H/O hiatal hernia   . Arthritis     spine  . Anxiety   . Multiple open wounds     "from esczema- scratching"  . Multiple facial bone fractures   . 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 History of bronchitis   . Pneumonia     in past had several times  . Shortness of breath     "sometime"  . Chronic lower back pain   . Kidney stones   . Depression     patient constantly hitting left side of face "due to pain"  . Pancreatitis     Past Surgical History  Procedure Laterality Date  . Litrotripsy    . Orif distal radius fracture  09/12/11    right  . Fracture surgery    . Metacarpal pinning      right hand  . Patella fracture surgery      right  . Tonsillectomy and adenoidectomy    . Eye surgery      3 Corneal transplant (bilateral eyes)  . Hardware removal  10/09/2011    Procedure: HARDWARE REMOVAL;  Surgeon: Nadara Mustard, MD;  Location[facility] OR;  Service: Orthopedics;  Laterality: Right;  Removal Deep Hardware Right Wrist, placement antibiotic beads.    Family History  Problem Relation Age of Onset  . Allergies Mother     atopic  . Asthma Mother   . Heart failure Mother     CHF  . Other Father   . Anesthesia problems Neg Hx    Social History:  reports that he quit smoking about 30 years ago. His smoking use included  Cigarettes and Pipe. He has a 3 pack-year smoking history. He has never used smokeless tobacco. He reports that he drinks alcohol. He reports that he uses illicit drugs (Marijuana).  Allergies:  Allergies  Allergen Reactions  . Bacitracin-Polymyxin B Other (See Comments)    Polysporin allergy per opthalmologist.   . Bee Venom Anaphylaxis  . Codeine Other (See Comments)    Makes patient clear throat   . Norco [Hydrocodone-Acetaminophen] Other (See Comments)    causes frontal headaches  . Penicillins Other (See Comments)    Childhood allergy/ makes patient clear throat a lot.  . Tape Hives    "plastic tape"  . Chlorhexidine Itching and Rash    Medications Prior to Admission  Medication Sig Dispense Refill  . albuterol (PROVENTIL) (2.5 MG/3ML) 0.083% nebulizer solution USE 1 VIAL WITH NEBULIZER EVERY 4 HOURS AS NEEDED FOR SHORTNESS OF BREATH  150 mL  1  . Calcium Carbonate-Vitamin D (CALCIUM 600+D) 600-400 MG-UNIT per tablet Take 1 tablet by mouth daily.      . diazepam (VALIUM) 10 MG tablet Take 10  mg by mouth 3 (three) times daily.       . fluticasone (FLONASE) 50 MCG/ACT nasal spray PLACE 2 SPRAYS INTO THE NOSE DAILY.  16 g  3  . hydrOXYzine (ATARAX/VISTARIL) 25 MG tablet Take 25 mg by mouth 3 (three) times daily as needed. For itching      . ibuprofen (ADVIL,MOTRIN) 200 MG tablet Take 800 mg by mouth every 6 (six) hours as needed. For pain      . levocetirizine (XYZAL) 5 MG tablet Take 1 tablet (5 mg total) by mouth every evening.  30 tablet  11  . prednisoLONE acetate (PRED FORTE) 1 % ophthalmic suspension Place 1 drop into both eyes daily.       Marland Kitchen. PROAIR HFA 108 (90 BASE) MCG/ACT inhaler INHALE 2 PUFFS INTO THE LUNGS EVERY 6 (SIX) HOURS AS NEEDED. FOR SHORTNESS OF BREATH  8.5 each  3  . sertraline (ZOLOFT) 100 MG tablet Take 200 mg by mouth daily.       . temazepam (RESTORIL) 15 MG capsule Take 15 mg by mouth at bedtime as needed. For sleep      . traZODone (DESYREL) 100 MG tablet  Take 100 mg by mouth at bedtime as needed. For insomnia      . mometasone-formoterol (DULERA) 200-5 MCG/ACT AERO Take 2 puffs first thing in am and then another 2 puffs about 12 hours later.      . montelukast (SINGULAIR) 10 MG tablet Take 1 tablet (10 mg total) by mouth at bedtime.  30 tablet  6  . omeprazole (PRILOSEC) 40 MG capsule TAKE ONE CAPSULE BY MOUTH TWICE A DAY  60 capsule  1    Results for orders placed during the hospital encounter of 06/15/13 (from the past 48 hour(s))  POCT HEMOGLOBIN-HEMACUE     Status: None   Collection Time    06/15/13  8:43 AM      Result Value Range   Hemoglobin 16.1  13.0 - 17.0 g/dL   No results found.  Review of Systems  All other systems reviewed and are negative.    Blood pressure 134/101, pulse 77, temperature 97.5 F (36.4 C), temperature source Oral, resp. rate 20, height 5\' 11"  (1.803 m), weight 73.846 kg (162 lb 12.8 oz), SpO2 98.00%. Physical Exam  Constitutional: He is oriented to person, place, and time. He appears well-developed and well-nourished.  HENT:  Head: Normocephalic and atraumatic.  Cardiovascular: Normal rate.   Respiratory: Effort normal.  Musculoskeletal:       Right wrist: He exhibits bony tenderness, swelling and deformity.  Right distal radius malunion  Neurological: He is alert and oriented to person, place, and time.  Skin: Skin is warm.  Psychiatric: He has a normal mood and affect. His behavior is normal. Judgment and thought content normal.     Assessment/Plan As above  Plan osteotomy and distal ulna resection with CTR and releases as needed  Haneen Bernales A 06/15/2013, 9:05 AM

## 2013-06-15 NOTE — Op Note (Signed)
See note [id number]

## 2013-06-15 NOTE — Anesthesia Preprocedure Evaluation (Signed)
Anesthesia Evaluation  Patient identified by MRN, date of birth, ID band Patient awake    Reviewed: Allergy & Precautions, H&P , NPO status , Patient's Chart, lab work & pertinent test results  Airway Mallampati: I  Neck ROM: Full    Dental  (+) Edentulous Upper   Pulmonary asthma , COPDformer smoker,  breath sounds clear to auscultation        Cardiovascular hypertension, Rhythm:Regular Rate:Normal     Neuro/Psych    GI/Hepatic hiatal hernia, GERD-  ,(+) Hepatitis -  Endo/Other    Renal/GU      Musculoskeletal   Abdominal   Peds  Hematology   Anesthesia Other Findings   Reproductive/Obstetrics                           Anesthesia Physical Anesthesia Plan  ASA: III  Anesthesia Plan:    Post-op Pain Management:    Induction: Intravenous  Airway Management Planned: LMA  Additional Equipment:   Intra-op Plan:   Post-operative Plan: Extubation in OR  Informed Consent: I have reviewed the patients History and Physical, chart, labs and discussed the procedure including the risks, benefits and alternatives for the proposed anesthesia with the patient or authorized representative who has indicated his/her understanding and acceptance.   Dental advisory given  Plan Discussed with: CRNA and Surgeon  Anesthesia Plan Comments:         Anesthesia Quick Evaluation

## 2013-06-16 ENCOUNTER — Encounter (HOSPITAL_BASED_OUTPATIENT_CLINIC_OR_DEPARTMENT_OTHER): Payer: Self-pay | Admitting: Orthopedic Surgery

## 2013-06-16 MED ORDER — IBUPROFEN 600 MG PO TABS
600.0000 mg | ORAL_TABLET | Freq: Three times a day (TID) | ORAL | Status: DC
  Administered 2013-06-16: 600 mg via ORAL
  Filled 2013-06-16: qty 1

## 2013-06-16 NOTE — Discharge Instructions (Addendum)
Carpal Tunnel Release (Repair), Care After Refer to this sheet in the next few weeks. These discharge instructions provide you with general information on caring for yourself after you leave the hospital. Your caregiver may also give you specific instructions. Your treatment has been planned according to the most current medical practices available, but unavoidable complications sometimes occur. If you have any problems or questions after discharge, please call your caregiver. HOME CARE INSTRUCTIONS   Have a responsible person with you for 24 hours.  Do not drive a car or take public transportation for 24 hours.  Only take over-the-counter or prescription medicines for pain, discomfort, or fever as directed by your caregiver. Take them as directed.  You may put ice on the palm side of the affected wrist.  Put ice in a plastic bag.  Place a towel between your skin and the bag.  Leave the ice on for 15-20 minutes, 03-04 times per day.  If you were given a splint to keep your wrist from bending, use it as directed. It is important to wear the splint at night or as directed. Use the splint for as long as you have pain or numbness in your hand, arm, or wrist. This may take 1 to 2 months.  Keep your hand raised (elevated) above the level of your heart as much as possible. This keeps swelling down and helps with discomfort.  Change bandages (dressings) as directed.  Keep the wound clean and dry. SEEK MEDICAL CARE IF:   You develop pain not relieved with medicines.  You develop numbness of your hand.  You develop bleeding from your surgical site.  You have an oral temperature above 102 F (38.9 C).  You develop redness or swelling of the surgical site.  You develop new, unexplained problems. SEEK IMMEDIATE MEDICAL CARE IF:   You develop a rash.  You have difficulty breathing.  You develop any reaction or side effects to medicines given. MAKE SURE YOU:   Understand these  instructions.  Will watch your condition.  Will get help right away if you are not doing well or get worse.  Wrist Arthroscopy Arthroscopy is a valuable test for evaluating the wrist joint. It is used mostly for diagnosis (finding a problem). Arthroscopy is a surgical technique that uses small incisions (cut made by the surgeon) to insert a small telescope-like instrument (arthroscope) and other tools into the joint. This allows the surgeon to look directly at the problem. The arthroscope then beams light into the joint and sends an image to a screen. As your surgeon examines your wrist, he or she can also repair a number of problems found at the same time. Sometimes the surgery may have to be changed to an open surgery if the problems are too severe. This is usually a very safe surgery. Possible complications include: damage to nerves or blood vessels, excess bleeding, blood clots, the possibility of infection, and rarely instrument failure. This is most often performed as a same day surgery. This means you will not have to stay in the hospital overnight. Recovery from this surgery is also much faster than having an open procedure. LET YOUR CAREGIVER KNOW ABOUT:  Allergies.  Medications taken including herbs, eye drops, over-the-counter medications, and creams.  Use of steroids (by mouth or creams).  Previous problems with anesthetics or numbing medicines.  Possibility of pregnancy, if this applies  History of blood clots (thrombophlebitis).  History of bleeding or blood problems.  Previous surgery.  Other health problems.  BEFORE THE PROCEDURE   Stop all anti-inflammatory medications at least 1 week prior to surgery unless instructed otherwise. Make sure to let your surgeon know if you have been taking cortisone.  Do not eat or drink after midnight or as instructed. Take medications as your caregiver instructs. You may have lab tests the morning of surgery.  You should be present 60  minutes prior to your procedure or as directed. PROCEDURE  You may have a general anesthetic. This is medication that makes you sleepy during the procedure. You may have a local anesthetic in which just the part being worked on is numb. Your surgeon, anesthesiologist, or anesthetist will discuss this with you. Some of the most commonly encountered problems in the wrist joint area are ligament tears and damage to the cartilage. Ligamentous tears may have to be repaired with an open procedure. Cartilage tears may be shaved off or loose pieces of cartilage removed. AFTER THE PROCEDURE   After surgery, you will be taken to the recovery area where a nurse will watch and check your progress. Once you are awake, stable, and taking fluids well, barring other problems you will be allowed to go home.  Once home, an ice pack applied to your operative site for 20 minutes, 3 to 4 times per day, for 2 to 3 days, may help with discomfort and keep the swelling down.  Use medications as prescribed.  Do range of motion exercises with wrist as directed.  Keep your wrist elevated and wrapped to keep swelling down and to decrease pain. This also speeds healing. REHABILITATION  Wrists are often swollen, stiff, and painful following surgery. The recovery time will vary with the procedure done, damage repaired, and the general health and age of the patient. Splints, casts, or bandages will be worn for various amounts of time.  If physical therapy and exercises are prescribed by your surgeon, follow them carefully.  Avoid gripping or lifting heavy objects.  Only take over-the-counter or prescription medicines for pain, discomfort, or fever as directed by your caregiver. SEEK IMMEDIATE MEDICAL CARE IF:   There is redness, swelling, or increasing pain in the wound or joint.  You notice purulent drainage (pus) coming from the wound.  An unexplained oral temperature above 102 F (38.9 C) develops.  You notice a  foul smell coming from the wound or dressing.  There is a breaking open of the wound (edges not staying together) after sutures or tape has been removed.

## 2013-06-16 NOTE — Op Note (Signed)
NAME:  [name] [name], [name] [name]              ACCOUNT NO.:  [id number]  MEDICAL RECORD NO.:  [id number]  LOCATION:                               FACILITY:  [facility]  PHYSICIAN:  Artist PaisMatthew A. 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  PROCEDURE: 1. Osteotomy, right distal radius. 2. Distal ulna resection. 3. Carpal tunnel release. 4. Fractional lengthening flexor carpi radialis tendon.  SURGEON:  Artist PaisMatthew A. Mina MarbleWeingold, M.D. and Cindee SaltGary Kuzma, M.D.  ANESTHESIA:  Supraclavicular block.  COMPLICATIONS:  No complication.  DRAINS:  No drains.  DESCRIPTION OF PROCEDURE:  The patient was taken to the operating suite. After induction of adequate supraclavicular block anesthesia and general laryngeal mask airway anesthetic, the right upper extremity was prepped and draped in usual sterile fashion.  An Esmarch was used to exsanguinate the limb.  Tourniquet was inflated to 275 mmHg.  At this point, incision was made dorsally in the midline over the distal radius and distal ulna.  Skin was incised sharply.  Dissection was carried down to the 6th dorsal compartment.  This was incised, the ECU tendon was retracted, the subperiosteal dissection of the distal ulna was undertaken, and the distal ulna was resected using a circulating saw. Once this was done, we did a subperiosteal dissection of the distal radius after identifying the EPL tendon and transposing it radially and bringing the 4th dorsal compartment ulnarly.  We identified the joint surface and intraoperative fluoroscopy was used to identify the angle of the joint surface, which was excessively  volarly flexed.  We then did an opening wedge osteotomy dorsally.  The first cut distally was parallel to the joint surface and the second one perpendicular to the long axis of the radius.  We reduced the osteotomy, held it with a transfixion 10, and intraoperative fluoroscopy then revealed neutral alignment on the AP and lateral.  There were still some residual radial tilt, so we did a small bit of fraying of the proximal fragment to maintain neutral alignment on the AP view.  This was then fixed with a pre-contoured dorsal T-plate from the [employer] small fragment set using cortical screws x3 proximally and 3 distally in the distal fragment.  Intraoperative fluoroscopy then revealed adequate reduction in the AP, lateral, and oblique view.  Given the shortening, there was still some ulnar positive variance relative to the radius, so we did a final 1 cm resection of the distal ulna.  We then fully supinated the patient and made an incision of the FCR border into the carpal tunnel area.  Skin was incised sharply.  Dissection was carried down the FCR sheath, which was incised. The FCR tendon was flexed, was fractionally lengthened using a Z-plasty technique with a tendon slide technique, and fixed with 3-0 FiberWire. The median nerve was identified proximally, traced in the carpal canal with significant compression of the median nerve.  This was completely alleviated  with all pressure points relieved.  The wound was then thoroughly irrigated and closed in layers of 2-0 undyed Vicryl and 4-0 Vicryl Rapide on the skin.  The hand was then pronated, the retinaculum was placed underneath the extensor tendons on top of the plates with 2-0 Vicryl and the skin with 4-0 Vicryl Rapide.  Xeroform, 4x4s, fluffs, and a sugar-tong splint was applied.  The patient tolerated the procedure well and went to the recovery room in a stable fashion.     Artist Pais Mina Marble, M.D.     MAW/MEDQ  D:   06/15/2013  T:  06/16/2013  Job:  [id number]

## 2013-06-22 ENCOUNTER — Ambulatory Visit: Payer: Medicaid Other | Attending: Orthopedic Surgery | Admitting: Occupational Therapy

## 2013-06-22 DIAGNOSIS — M25649 Stiffness of unspecified hand, not elsewhere classified: Secondary | ICD-10-CM | POA: Insufficient documentation

## 2013-06-22 DIAGNOSIS — IMO0001 Reserved for inherently not codable concepts without codable children: Secondary | ICD-10-CM | POA: Insufficient documentation

## 2013-06-22 DIAGNOSIS — M25549 Pain in joints of unspecified hand: Secondary | ICD-10-CM | POA: Insufficient documentation

## 2013-08-01 ENCOUNTER — Other Ambulatory Visit: Payer: Self-pay | Admitting: Otolaryngology

## 2013-08-05 ENCOUNTER — Ambulatory Visit (INDEPENDENT_AMBULATORY_CARE_PROVIDER_SITE_OTHER): Payer: Medicaid Other | Admitting: Critical Care Medicine

## 2013-08-05 ENCOUNTER — Encounter: Payer: Self-pay | Admitting: Critical Care Medicine

## 2013-08-05 VITALS — BP 108/70 | HR 73 | Temp 97.3°F | Ht 71.0 in | Wt 157.0 lb

## 2013-08-05 DIAGNOSIS — J45909 Unspecified asthma, uncomplicated: Secondary | ICD-10-CM

## 2013-08-05 MED ORDER — MOMETASONE FURO-FORMOTEROL FUM 200-5 MCG/ACT IN AERO: INHALATION_SPRAY | RESPIRATORY_TRACT | Status: DC

## 2013-08-05 MED ORDER — FEXOFENADINE-PSEUDOEPHED ER 180-240 MG PO TB24: 1.0000 | ORAL_TABLET | Freq: Every day | ORAL | Status: DC

## 2013-08-05 NOTE — Progress Notes (Signed)
Subjective:    Patient ID: [name] [name], male    DOB: [birth year], 63 y.o.   MRN: [id number]  HPI 63 y.o..  with   severe atopic asthma.   08/05/2013 Chief Complaint  Patient presents with  . Follow-up    Has SOB early in the mornings and late at night - feels this is unchanged.  Has some wheezing.  No chest tightness/pain or cough.  Dyspnea is the same.  No changes.  No real cough now Pt denies any significant sore throat, nasal congestion or excess secretions, fever, chills, sweats, unintended weight loss, pleurtic or exertional chest pain, orthopnea PND, or leg swelling Pt denies any increase in rescue therapy over baseline, denies waking up needing it or having any early am or nocturnal exacerbations of coughing/wheezing/or dyspnea. Pt also denies any obvious fluctuation in symptoms with  weather or environmental change or other alleviating or aggravating factors  Just had sinus surgery.   Now on dulera and helps more than the advair Pt on xyzal  PUL ASTHMA HISTORY 08/05/2013 06/30/2012 02/11/2012 01/07/2011  Symptoms Daily Daily 0-2 days/week >2 days/week  Nighttime awakenings 0-2/month Often--7/wk 0-2/month 0-2/month  Interference with activity No limitations Some limitations No limitations Minor limitations  SABA use > 2 days/wk--not > 1 x/day Several times/day 0-2 days/wk > 2 days/wk--not > 1 x/day  Exacerbations requiring oral steroids 0-1 / year 2 or more / year 0-1 / year 0-1 / year   Review of Systems 11 point review of systems taken in detail and is negative except as noted in history present illness    Objective:   Physical Exam Filed Vitals:   08/05/13 1603  BP: 108/70  Pulse: 73  Temp: 97.3 F (36.3 C)  TempSrc: Oral  Height: 5\' 11"  (1.803 m)  Weight: 71.215 kg (157 lb)  SpO2: 95%    Gen: Pleasant, well-nourished, in no distress,  normal affect  ENT: No lesions,  mouth clear,  oropharynx clear, no postnasal drip  Neck: No JVD, no TMG, no carotid  bruits  Lungs: No use of accessory muscles, no dullness to percussion, clear without rales or rhonchi  Cardiovascular: RRR, heart sounds normal, no murmur or gallops, no peripheral edema  Abdomen: soft and NT, no HSM,  BS normal  Musculoskeletal: No deformities, no cyanosis or clubbing  Neuro: alert, non focal  Skin: Warm, no lesions or rashes  No results found.     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 albuterol (PROVENTIL) (2.5 MG/3ML) 0.083% nebulizer solution USE 1 VIAL WITH NEBULIZER EVERY 4 HOURS AS NEEDED FOR SHORTNESS OF BREATH  . Calcium Carbonate-Vitamin D (CALCIUM 600+D) 600-400 MG-UNIT per tablet Take 1 tablet by mouth daily.  . diazepam (VALIUM) 10 MG tablet Take 10 mg by mouth 3 (three) times daily.   . fluticasone (FLONASE) 50 MCG/ACT nasal spray PLACE 2 SPRAYS INTO THE NOSE DAILY.  . hydrOXYzine (ATARAX/VISTARIL) 25 MG tablet Take 25 mg by mouth 3 (three) times daily as needed. For itching  . ibuprofen (ADVIL,MOTRIN) 200 MG tablet Take 800 mg by mouth every 6 (six) hours as needed. For pain  . mometasone-formoterol (DULERA) 200-5 MCG/ACT AERO Take 2 puffs first thing in am  and then another 2 puffs about 12 hours later.  Marland Kitchen. omeprazole (PRILOSEC) 40 MG capsule TAKE ONE CAPSULE BY MOUTH TWICE A DAY  . prednisoLONE acetate (PRED FORTE) 1 % ophthalmic suspension Place 1 drop into both eyes daily.   Marland Kitchen. PROAIR HFA 108 (90 BASE) MCG/ACT inhaler INHALE 2 PUFFS INTO THE LUNGS EVERY 6 (SIX) HOURS AS NEEDED. FOR SHORTNESS OF BREATH  . sertraline (ZOLOFT) 100 MG tablet Take 200 mg by mouth daily.   . temazepam (RESTORIL) 15 MG capsule Take 15 mg by mouth at bedtime as needed.  For sleep  . traZODone (DESYREL) 100 MG tablet Take 100 mg by mouth at bedtime as needed. For insomnia  . [DISCONTINUED] levocetirizine (XYZAL) 5 MG tablet Take 1 tablet (5 mg total) by mouth every evening.  . [DISCONTINUED] mometasone-formoterol (DULERA) 200-5 MCG/ACT AERO Take 2 puffs first thing in am and then another 2 puffs about 12 hours later.  . fexofenadine-pseudoephedrine (ALLEGRA-D 24 HOUR) 180-240 MG per 24 hr tablet Take 1 tablet by mouth daily.  . [DISCONTINUED] montelukast (SINGULAIR) 10 MG tablet Take 1 tablet (10 mg total) by mouth at bedtime.  . [DISCONTINUED] oxyCODONE-acetaminophen (ROXICET) 5-325 MG per tablet Take 1 tablet by mouth every 4 (four) hours as needed for severe pain.

## 2013-08-05 NOTE — Patient Instructions (Signed)
D/c xyzal  Start allegra D one daily Continue dulera 200 two puff twice daily Return 4 months

## 2013-08-06 NOTE — Assessment & Plan Note (Signed)
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

## 2013-08-24 ENCOUNTER — Telehealth: Payer: Self-pay | Admitting: Critical Care Medicine

## 2013-08-24 NOTE — Telephone Encounter (Signed)
Per OV 08/05/13: Patient Instructions      D/c xyzal   Start allegra D one daily Continue dulera 200 two puff twice daily Return 4 months  --  Spoke with pt. He reports allegra D Medicaid will not pay for this medication and he can't afford it OTC. He reports we got xyzal approved for him in the past. He would like this called back in for him or xyzal "w/ a decongestant". Pt reports he can't afford anything OTC.  Please advise Dr. Delford FieldWright thanks

## 2013-08-24 NOTE — Telephone Encounter (Signed)
Try xyzal D  daily

## 2013-08-24 NOTE — Telephone Encounter (Signed)
LMTCB

## 2013-08-25 MED ORDER — LEVOCETIRIZINE DIHYDROCHLORIDE 5 MG PO TABS: 5.0000 mg | ORAL_TABLET | Freq: Every evening | ORAL | Status: DC

## 2013-08-25 NOTE — Telephone Encounter (Signed)
There is not a xyzal D. I spoke with the pt and he states xyzal worked well for him in the past and prefers this be sent in versus something else. Rx sent. Carron CurieJennifer Almina Schul, CMA

## 2013-09-02 ENCOUNTER — Other Ambulatory Visit: Payer: Self-pay | Admitting: Critical Care Medicine

## 2013-10-16 ENCOUNTER — Other Ambulatory Visit: Payer: Self-pay | Admitting: Critical Care Medicine

## 2013-10-19 ENCOUNTER — Ambulatory Visit: Payer: Medicaid Other | Admitting: Occupational Therapy

## 2013-10-25 ENCOUNTER — Telehealth: Payer: Self-pay | Admitting: Critical Care Medicine

## 2013-10-25 NOTE — Telephone Encounter (Signed)
[id number] 

## 2013-10-25 NOTE — Telephone Encounter (Signed)
Yes but he is blind, someone will have to demonstrate BY FEEL, how to use this

## 2013-10-25 NOTE — Telephone Encounter (Signed)
Called spoke with pt. He is requesting a sample of albuterol inhaler.  We have proair respiclick sample. Dr. Delford Field please advise if okay to give this to pt? thanks

## 2013-10-26 MED ORDER — ALBUTEROL SULFATE 108 (90 BASE) MCG/ACT IN AEPB: 2.0000 | INHALATION_SPRAY | Freq: Four times a day (QID) | RESPIRATORY_TRACT | Status: DC | PRN

## 2013-10-26 NOTE — Telephone Encounter (Signed)
Called spoke with pt. He would like to try the new proair respiclick.  He has an appt on 11/02/13 and would like to be shown then.  I have giving sample to CJ. Nothing further needed

## 2013-11-02 ENCOUNTER — Encounter: Payer: Self-pay | Admitting: Critical Care Medicine

## 2013-11-02 ENCOUNTER — Ambulatory Visit (INDEPENDENT_AMBULATORY_CARE_PROVIDER_SITE_OTHER): Payer: Medicaid Other | Admitting: Critical Care Medicine

## 2013-11-02 VITALS — BP 114/66 | HR 84 | Ht 71.0 in | Wt 158.0 lb

## 2013-11-02 DIAGNOSIS — J309 Allergic rhinitis, unspecified: Secondary | ICD-10-CM

## 2013-11-02 DIAGNOSIS — J45909 Unspecified asthma, uncomplicated: Secondary | ICD-10-CM

## 2013-11-02 MED ORDER — METHYLPREDNISOLONE ACETATE 80 MG/ML IJ SUSP: 80.0000 mg | Freq: Once | INTRAMUSCULAR | Status: DC

## 2013-11-02 NOTE — Progress Notes (Signed)
Subjective:    Patient ID: [name] [name], male    DOB: June 20, 1950, 63 y.o.   MRN: [id number]  HPI  63 y.o..  with   severe atopic asthma.   11/02/2013 Chief Complaint  Patient presents with  . Follow-up    C/o wheezing worse in mornings and night and aggrivated by perfumes and smells, as well as chest tightness and nonprod cough.    Notes more wheezing AM and PM.  Pt has dry cough.  No real chest pain.   There is no mucus with cough. There is nocturnal wheezing There is no significant heartburn There is no lower sternum swelling or edema    PUL ASTHMA HISTORY 11/02/2013 08/05/2013 06/30/2012 02/11/2012 01/07/2011  Symptoms Throughout the day Daily Daily 0-2 days/week >2 days/week  Nighttime awakenings 3-4/month 0-2/month Often--7/wk 0-2/month 0-2/month  Interference with activity Some limitations No limitations Some limitations No limitations Minor limitations  SABA use > 2 days/wk--not > 1 x/day > 2 days/wk--not > 1 x/day Several times/day 0-2 days/wk > 2 days/wk--not > 1 x/day  Exacerbations requiring oral steroids 2 or more / year 0-1 / year 2 or more / year 0-1 / year 0-1 / year   Review of Systems  11 point review of systems taken in detail and is negative except as noted in history present illness    Objective:   Physical Exam  Filed Vitals:   11/02/13 1114  BP: 114/66  Pulse: 84  Height: 5\' 11"  (1.803 m)  Weight: 158 lb (71.668 kg)  SpO2: 97%    Gen: Pleasant, well-nourished, in no distress,  normal affect  ENT: No lesions,  mouth clear,  oropharynx clear, mod  postnasal drip  Neck: No JVD, no TMG, no carotid bruits  Lungs: No use of accessory muscles, no dullness to percussion, inspiratory expiratory wheezes, poor airflow  Cardiovascular: RRR, heart sounds normal, no murmur or gallops, no peripheral edema  Abdomen: soft and NT, no HSM,  BS normal  Musculoskeletal: No deformities, no cyanosis or clubbing  Neuro: alert, non focal  Skin: Warm, no  lesions or rashes  No results found.     Assessment & Plan:   ASTHMA, PERSISTENT, SEVERE Severe persistent asthma steroid dependent significant atopic features Plan Depo-Medrol injection was administered Continued inhaled medications Referral to allergy    Updated Medication List Outpatient Encounter Prescriptions as of 11/02/2013  Medication Sig  . albuterol (PROVENTIL) (2.5 MG/3ML) 0.083% nebulizer solution USE 1 VIAL WITH NEBULIZER EVERY 4 HOURS AS NEEDED FOR SHORTNESS OF BREATH  . Albuterol Sulfate (PROAIR RESPICLICK) 108 (90 BASE) MCG/ACT AEPB Inhale 2 puffs into the lungs every 6 (six) hours as needed.  . Calcium Carbonate-Vitamin D (CALCIUM 600+D) 600-400 MG-UNIT per tablet Take 1 tablet by mouth daily.  . diazepam (VALIUM) 10 MG tablet Take 10 mg by mouth 3 (three) times daily.   . fluticasone (FLONASE) 50 MCG/ACT nasal spray PLACE 2 SPRAYS INTO THE NOSE DAILY.  . hydrOXYzine (ATARAX/VISTARIL) 25 MG tablet Take 25 mg by mouth 3 (three) times daily as needed. For itching  . ibuprofen (ADVIL,MOTRIN) 200 MG tablet Take 800 mg by mouth every 6 (six) hours as needed. For pain  . mometasone-formoterol (DULERA) 200-5 MCG/ACT AERO Take 2 puffs first thing in am and then another 2 puffs about 12 hours later.  Marland Kitchen. omeprazole (PRILOSEC) 40 MG capsule TAKE ONE CAPSULE BY MOUTH TWICE A DAY  . prednisoLONE acetate (PRED FORTE) 1 % ophthalmic suspension Place 1 drop into both  eyes daily.   . sertraline (ZOLOFT) 100 MG tablet Take 200 mg by mouth daily.   . temazepam (RESTORIL) 15 MG capsule Take 15 mg by mouth at bedtime as needed. For sleep  . traZODone (DESYREL) 100 MG tablet Take 100 mg by mouth at bedtime as needed. For insomnia  . methylPREDNISolone acetate (DEPO-MEDROL) 80 MG/ML injection Inject 1 mL (80 mg total) into the muscle once.  . [DISCONTINUED] levocetirizine (XYZAL) 5 MG tablet Take 1 tablet (5 mg total) by mouth every evening.  . [DISCONTINUED] PROAIR HFA 108 (90 BASE)  MCG/ACT inhaler INHALE 2 PUFFS INTO THE LUNGS EVERY 6 (SIX) HOURS AS NEEDED. FOR SHORTNESS OF BREATH  . [DISCONTINUED] PROAIR HFA 108 (90 BASE) MCG/ACT inhaler INHALE 2 PUFFS INTO THE LUNGS EVERY 6 HOURS AS NEEDED FOR SHORTNESS OF BREATH

## 2013-11-02 NOTE — Patient Instructions (Signed)
A depomedrol injection was given 120mg   No other changes A referral to Dr Maple HudsonYoung will be made Return 4 months

## 2013-11-03 NOTE — Assessment & Plan Note (Addendum)
Severe persistent asthma steroid dependent significant atopic features Plan Depo-Medrol injection was administered Continued inhaled medications Referral to allergy

## 2013-11-29 ENCOUNTER — Institutional Professional Consult (permissible substitution): Payer: Medicaid Other | Admitting: Internal Medicine

## 2013-12-01 ENCOUNTER — Encounter: Payer: Self-pay | Admitting: Neurology

## 2013-12-01 ENCOUNTER — Ambulatory Visit (INDEPENDENT_AMBULATORY_CARE_PROVIDER_SITE_OTHER): Payer: Medicaid Other | Admitting: Neurology

## 2013-12-01 VITALS — BP 117/70 | HR 86 | Ht 70.0 in | Wt 158.0 lb

## 2013-12-01 DIAGNOSIS — G609 Hereditary and idiopathic neuropathy, unspecified: Secondary | ICD-10-CM

## 2013-12-01 DIAGNOSIS — R2681 Unsteadiness on feet: Secondary | ICD-10-CM

## 2013-12-01 DIAGNOSIS — R269 Unspecified abnormalities of gait and mobility: Secondary | ICD-10-CM
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
## 2013-12-05 ENCOUNTER — Other Ambulatory Visit: Payer: Self-pay | Admitting: Neurology

## 2013-12-05 DIAGNOSIS — R2681 Unsteadiness on feet: Secondary | ICD-10-CM

## 2013-12-06 LAB — PROTEIN ELECTROPHORESIS
A/G Ratio: 1.2 (ref 0.7–2.0)
ALPHA 1: 0.3 g/dL (ref 0.1–0.4)
Albumin ELP: 3.4 g/dL (ref 3.2–5.6)
Alpha 2: 0.6 g/dL (ref 0.4–1.2)
Beta: 1 g/dL (ref 0.6–1.3)
Gamma Globulin: 1 g/dL (ref 0.5–1.6)
Globulin, Total: 2.9 g/dL (ref 2.0–4.5)
Total Protein: 6.3 g/dL (ref 6.0–8.5)

## 2013-12-06 LAB — VITAMIN B12: Vitamin B-12: 334 pg/mL (ref 211–946)

## 2013-12-06 LAB — TSH: TSH: 2.17 u[IU]/mL (ref 0.450–4.500)

## 2013-12-06 LAB — HGB A1C W/O EAG: Hgb A1c MFr Bld: 5.6 % (ref 4.8–5.6)

## 2013-12-06 LAB — METHYLMALONIC ACID, SERUM: METHYLMALONIC ACID: 344 nmol/L (ref 0–378)

## 2013-12-07 ENCOUNTER — Telehealth: Payer: Self-pay | Admitting: *Deleted

## 2013-12-07 NOTE — Telephone Encounter (Signed)
Message copied by Ardeth SportsmanHODES, Yulonda Wheeling L on Wed Dec 07, 2013  8:50 AM ------      Message from: Ramond MarrowSUMNER, PETER J      Created: Tue Dec 06, 2013  3:44 PM       Please let him know his labs were normal. Thanks. ------

## 2013-12-07 NOTE — Telephone Encounter (Signed)
Patient is aware of normal results. Patient scheduled a follow up appt per last office note.

## 2013-12-12 ENCOUNTER — Ambulatory Visit: Payer: Medicaid Other | Admitting: Critical Care Medicine
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
## 2013-12-23 ENCOUNTER — Ambulatory Visit: Payer: Medicaid Other | Admitting: Critical Care Medicine

## 2014-01-04 ENCOUNTER — Emergency Department (HOSPITAL_COMMUNITY)
Admission: EM | Admit: 2014-01-04 | Discharge: 2014-01-04 | Disposition: A | Discharge status: Home / Self Care | Payer: Medicaid Other | Attending: Emergency Medicine | Admitting: Emergency Medicine

## 2014-01-04 ENCOUNTER — Emergency Department (HOSPITAL_COMMUNITY): Payer: Medicaid Other

## 2014-01-04 ENCOUNTER — Encounter (HOSPITAL_COMMUNITY): Payer: Self-pay | Admitting: Emergency Medicine

## 2014-01-04 ENCOUNTER — Encounter: Payer: Self-pay | Admitting: Neurology

## 2014-01-04 ENCOUNTER — Ambulatory Visit (INDEPENDENT_AMBULATORY_CARE_PROVIDER_SITE_OTHER): Payer: Medicaid Other | Admitting: Neurology

## 2014-01-04 VITALS — BP 144/93 | HR 86 | Temp 97.1°F | Ht 70.0 in | Wt 153.0 lb

## 2014-01-04 DIAGNOSIS — G8929 Other chronic pain: Secondary | ICD-10-CM | POA: Insufficient documentation

## 2014-01-04 DIAGNOSIS — M129 Arthropathy, unspecified: Secondary | ICD-10-CM | POA: Diagnosis not present

## 2014-01-04 DIAGNOSIS — Z8701 Personal history of pneumonia (recurrent): Secondary | ICD-10-CM | POA: Diagnosis not present

## 2014-01-04 DIAGNOSIS — G609 Hereditary and idiopathic neuropathy, unspecified: Secondary | ICD-10-CM

## 2014-01-04 DIAGNOSIS — Z87442 Personal history of urinary calculi: Secondary | ICD-10-CM | POA: Insufficient documentation

## 2014-01-04 DIAGNOSIS — H548 Legal blindness, as defined in USA: Secondary | ICD-10-CM | POA: Insufficient documentation

## 2014-01-04 DIAGNOSIS — K219 Gastro-esophageal reflux disease without esophagitis: Secondary | ICD-10-CM | POA: Insufficient documentation

## 2014-01-04 DIAGNOSIS — R7989 Other specified abnormal findings of blood chemistry: Secondary | ICD-10-CM

## 2014-01-04 DIAGNOSIS — R799 Abnormal finding of blood chemistry, unspecified: Secondary | ICD-10-CM | POA: Insufficient documentation

## 2014-01-04 DIAGNOSIS — F411 Generalized anxiety disorder: Secondary | ICD-10-CM | POA: Insufficient documentation

## 2014-01-04 DIAGNOSIS — Z87891 Personal history of nicotine dependence: Secondary | ICD-10-CM | POA: Diagnosis not present

## 2014-01-04 DIAGNOSIS — F329 Major depressive disorder, single episode, unspecified: Secondary | ICD-10-CM | POA: Diagnosis not present

## 2014-01-04 DIAGNOSIS — Y9389 Activity, other specified: Secondary | ICD-10-CM | POA: Diagnosis not present

## 2014-01-04 DIAGNOSIS — Z880 Allergy status to penicillin: Secondary | ICD-10-CM | POA: Diagnosis not present

## 2014-01-04 DIAGNOSIS — R296 Repeated falls: Secondary | ICD-10-CM | POA: Insufficient documentation

## 2014-01-04 DIAGNOSIS — I10 Essential (primary) hypertension: Secondary | ICD-10-CM | POA: Diagnosis not present

## 2014-01-04 DIAGNOSIS — J441 Chronic obstructive pulmonary disease with (acute) exacerbation: Secondary | ICD-10-CM | POA: Insufficient documentation

## 2014-01-04 DIAGNOSIS — Y92009 Unspecified place in unspecified non-institutional (private) residence as the place of occurrence of the external cause: Secondary | ICD-10-CM | POA: Diagnosis not present

## 2014-01-04 DIAGNOSIS — S2249XA Multiple fractures of ribs, unspecified side, initial encounter for closed fracture: Secondary | ICD-10-CM | POA: Insufficient documentation

## 2014-01-04 DIAGNOSIS — IMO0002 Reserved for concepts with insufficient information to code with codable children: Secondary | ICD-10-CM | POA: Diagnosis not present

## 2014-01-04 DIAGNOSIS — F3289 Other specified depressive episodes: Secondary | ICD-10-CM | POA: Diagnosis not present

## 2014-01-04 DIAGNOSIS — Z8619 Personal history of other infectious and parasitic diseases: Secondary | ICD-10-CM | POA: Diagnosis not present

## 2014-01-04 DIAGNOSIS — R2681 Unsteadiness on feet: Secondary | ICD-10-CM

## 2014-01-04 DIAGNOSIS — Z872 Personal history of diseases of the skin and subcutaneous tissue: Secondary | ICD-10-CM | POA: Diagnosis not present

## 2014-01-04 DIAGNOSIS — S2231XA Fracture of one rib, right side, initial encounter for closed fracture: Secondary | ICD-10-CM

## 2014-01-04 DIAGNOSIS — Z79899 Other long term (current) drug therapy: Secondary | ICD-10-CM | POA: Diagnosis not present

## 2014-01-04 DIAGNOSIS — R269 Unspecified abnormalities of gait and mobility: Secondary | ICD-10-CM

## 2014-01-04 DIAGNOSIS — J309 Allergic rhinitis, unspecified: Secondary | ICD-10-CM | POA: Insufficient documentation

## 2014-01-04 DIAGNOSIS — S298XXA Other specified injuries of thorax, initial encounter: Secondary | ICD-10-CM | POA: Insufficient documentation

## 2014-01-04 DIAGNOSIS — J45901 Unspecified asthma with (acute) exacerbation: Secondary | ICD-10-CM

## 2014-01-04 DIAGNOSIS — W19XXXA Unspecified fall, initial encounter: Secondary | ICD-10-CM

## 2014-01-04 LAB — URINALYSIS, ROUTINE W REFLEX MICROSCOPIC
Bilirubin Urine: NEGATIVE
Glucose, UA: NEGATIVE mg/dL
Hgb urine dipstick: NEGATIVE
Ketones, ur: NEGATIVE mg/dL
NITRITE: NEGATIVE
Protein, ur: NEGATIVE mg/dL
SPECIFIC GRAVITY, URINE: 1.015 (ref 1.005–1.030)
UROBILINOGEN UA: 0.2 mg/dL (ref 0.0–1.0)
pH: 5.5 (ref 5.0–8.0)

## 2014-01-04 LAB — CBC WITH DIFFERENTIAL/PLATELET
BASOS ABS: 0 10*3/uL (ref 0.0–0.1)
BASOS PCT: 0 % (ref 0–1)
Eosinophils Absolute: 0.8 10*3/uL — ABNORMAL HIGH (ref 0.0–0.7)
Eosinophils Relative: 9 % — ABNORMAL HIGH (ref 0–5)
HCT: 41.7 % (ref 39.0–52.0)
HEMOGLOBIN: 14.4 g/dL (ref 13.0–17.0)
Lymphocytes Relative: 20 % (ref 12–46)
Lymphs Abs: 1.8 10*3/uL (ref 0.7–4.0)
MCH: 30.7 pg (ref 26.0–34.0)
MCHC: 34.5 g/dL (ref 30.0–36.0)
MCV: 88.9 fL (ref 78.0–100.0)
Monocytes Absolute: 0.9 10*3/uL (ref 0.1–1.0)
Monocytes Relative: 10 % (ref 3–12)
NEUTROS ABS: 5.4 10*3/uL (ref 1.7–7.7)
Neutrophils Relative %: 61 % (ref 43–77)
Platelets: 234 10*3/uL (ref 150–400)
RBC: 4.69 MIL/uL (ref 4.22–5.81)
RDW: 13.4 % (ref 11.5–15.5)
WBC: 8.9 10*3/uL (ref 4.0–10.5)

## 2014-01-04 LAB — BASIC METABOLIC PANEL
ANION GAP: 13 (ref 5–15)
BUN: 13 mg/dL (ref 6–23)
CALCIUM: 9.3 mg/dL (ref 8.4–10.5)
CO2: 26 mEq/L (ref 19–32)
CREATININE: 1.46 mg/dL — AB (ref 0.50–1.35)
Chloride: 105 mEq/L (ref 96–112)
GFR calc Af Amer: 58 mL/min — ABNORMAL LOW (ref >90)
GFR calc non Af Amer: 50 mL/min — ABNORMAL LOW (ref >90)
Glucose, Bld: 90 mg/dL (ref 70–99)
Potassium: 3.3 mEq/L — ABNORMAL LOW (ref 3.7–5.3)
Sodium: 144 mEq/L (ref 137–147)

## 2014-01-04 LAB — URINE MICROSCOPIC-ADD ON

## 2014-01-04 MED ORDER — TRAMADOL HCL 50 MG PO TABS: 50.0000 mg | ORAL_TABLET | Freq: Every evening | ORAL | Status: DC | PRN

## 2014-01-04 MED ORDER — MORPHINE SULFATE 4 MG/ML IJ SOLN
4.0000 mg | Freq: Once | INTRAMUSCULAR | Status: AC
  Administered 2014-01-04: 4 mg via INTRAMUSCULAR
  Filled 2014-01-04: qty 1

## 2014-01-04 MED ORDER — FENTANYL CITRATE 0.05 MG/ML IJ SOLN
25.0000 ug | Freq: Once | INTRAMUSCULAR | Status: AC
  Administered 2014-01-04: 25 ug via INTRAMUSCULAR
  Filled 2014-01-04: qty 2

## 2014-01-04 MED ORDER — IBUPROFEN 800 MG PO TABS: 800.0000 mg | ORAL_TABLET | Freq: Three times a day (TID) | ORAL | Status: DC

## 2014-01-04 NOTE — ED Provider Notes (Signed)
CSN: [id number]     Arrival date & time 01/04/14  0557 History   None    Chief Complaint  Patient presents with  . Fall     (Consider location/radiation/quality/duration/timing/severity/associated sxs/prior Treatment) HPI Comments: The patient is a 63 year old male with past medical history of blindness, hypertension, COPD, presents emergency room chief complaint of right sided rib pain since yesterday. The patient reports falling onto a railing. He denies blow to head, loss of consciousness.  The patient reports multiple falls over the last 3 months, currently being evaluated by neurology. Denies visual changes, lightheadedness, patients for chest pain prior to falls.  Patient is a 63 y.o. male presenting with fall. The history is provided by the patient. No language interpreter was used.  Fall Pertinent negatives include no abdominal pain, chills, fever, nausea, vomiting or weakness.    Past Medical History  Diagnosis Date  . 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 Cough   . Esophageal reflux   . Osteopenia   . Personal history of other diseases of digestive system   . Contact dermatitis and other eczema, due to unspecified cause   . Unspecified sinusitis (chronic)   . Allergic rhinitis   . Unspecified asthma(493.90)   . COPD (chronic obstructive pulmonary disease)   . Hypertension   . Seasonal allergies   . Ulcerative colitis   . H/O hiatal hernia   . Arthritis     spine  . Anxiety   . Multiple open wounds     "from esczema- scratching"  . Multiple facial bone fractures   . 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 History of bronchitis   . Pneumonia     in past had several times  . Shortness of breath     "sometime"  . Chronic lower back pain   . Kidney stones   . Depression     patient constantly hitting left side of face "due to pain"  . Pancreatitis    Past Surgical History  Procedure Laterality Date  . Litrotripsy    .  Orif distal radius fracture  09/12/11    right  . Fracture surgery    . Metacarpal pinning      right hand  . Patella fracture surgery      right  . Tonsillectomy and adenoidectomy    . Eye surgery      3 Corneal transplant (bilateral eyes)  . Hardware removal  10/09/2011    Procedure: HARDWARE REMOVAL;  Surgeon: Nadara MustardMarcus V Duda, MD;  [facility];  Service: Orthopedics;  Laterality: Right;  Removal Deep Hardware Right Wrist, placement antibiotic beads.  . Orif wrist fracture Right 06/15/2013    Procedure: RIGHT RADIUS PSTEOTOMY, DISTAL ULNA RESECTION, AND DIGITAL FLEXOR LENGTHENING AS NEEDED;  Surgeon: Marlowe ShoresMatthew A Weingold, MD;  Location: [facility];  Service: Orthopedics;  Laterality: Right;  . Carpal tunnel release Right 06/15/2013    Procedure: RIGHT CARPAL TUNNEL RELEASE;  Surgeon: Marlowe ShoresMatthew A Weingold, MD;  Location: [facility];  Service: Orthopedics;  Laterality: Right;   Family History  Problem Relation Age of Onset  . Allergies Mother     atopic  . Asthma Mother   . Heart failure Mother     CHF  . Other Father   . Anesthesia problems Neg Hx    History  Substance Use Topics  . Smoking status: Former Smoker -- 1.00  packs/day for 3 years    Types: Cigarettes, Pipe    Quit date: 05/20/1983  . Smokeless tobacco: Never Used  . Alcohol Use: Yes     Comment: "scotch once a year"    Review of Systems  Constitutional: Negative for fever and chills.  Eyes: Negative for visual disturbance.  Cardiovascular: Negative for palpitations.  Gastrointestinal: Negative for nausea, vomiting, abdominal pain and diarrhea.  Neurological: Negative for weakness.      Allergies  Bacitracin-polymyxin b; Bee venom; Codeine; Norco; Penicillins; Tape; and Chlorhexidine  Home Medications   Prior to Admission medications   Medication Sig Start Date End Date Taking? Authorizing Provider  albuterol (PROVENTIL) (2.5 MG/3ML) 0.083% nebulizer solution Take 2.5 mg by  nebulization every 4 (four) hours as needed for wheezing or shortness of breath.   Yes Historical Provider, MD  Albuterol Sulfate (PROAIR RESPICLICK) 108 (90 BASE) MCG/ACT AEPB Inhale 2 puffs into the lungs every 6 (six) hours as needed. 10/26/13  Yes Storm Frisk, MD  Aspirin-Caffeine [phone] MG PACK Take 1 packet by mouth every 4 (four) hours as needed (pain).   Yes Historical Provider, MD  diazepam (VALIUM) 10 MG tablet Take 10 mg by mouth 3 (three) times daily.    Yes Historical Provider, MD  fluticasone (FLONASE) 50 MCG/ACT nasal spray Place 2 sprays into both nostrils daily.   Yes Historical Provider, MD  hydrOXYzine (ATARAX/VISTARIL) 25 MG tablet Take 25 mg by mouth 3 (three) times daily as needed. For itching   Yes Historical Provider, MD  ibuprofen (ADVIL,MOTRIN) 200 MG tablet Take 800 mg by mouth every 6 (six) hours as needed. For pain   Yes Historical Provider, MD  mometasone-formoterol (DULERA) 200-5 MCG/ACT AERO Take 2 puffs first thing in am and then another 2 puffs about 12 hours later. 08/05/13  Yes Storm Frisk, MD  omeprazole (PRILOSEC) 40 MG capsule Take 40 mg by mouth 2 (two) times daily.   Yes Historical Provider, MD  prednisoLONE acetate (PRED FORTE) 1 % ophthalmic suspension Place 1 drop into both eyes daily.    Yes Historical Provider, MD  sertraline (ZOLOFT) 100 MG tablet Take 200 mg by mouth daily.    Yes Historical Provider, MD  temazepam (RESTORIL) 15 MG capsule Take 15 mg by mouth at bedtime as needed. For sleep   Yes Historical Provider, MD   BP 137/86  Pulse 71  Temp(Src) 98.2 F (36.8 C) (Oral)  Resp 22  Ht 5\' 11"  (1.803 m)  Wt 155 lb (70.308 kg)  BMI 21.63 kg/m2  SpO2 98% Physical Exam  Nursing note and vitals reviewed. Constitutional: He is oriented to person, place, and time. He appears well-developed and well-nourished.  HENT:  Head: Normocephalic.  Neck: Neck supple.  Cardiovascular: Normal rate and regular rhythm.   No lower extremity swelling.    Pulmonary/Chest: Effort normal. Not tachypneic. No respiratory distress. He has no decreased breath sounds. He has no wheezes. He has no rhonchi. He has no rales. He exhibits tenderness. He exhibits no deformity.    Not tachypnea on exam. Patient is able to speak in complete sentences. No flail chest.  Abdominal: Soft. There is no tenderness. There is no rebound.  Musculoskeletal: Normal range of motion.  Neurological: He is alert and oriented to person, place, and time.  Good and equal sensation and stregth to lower extremities.  Skin: Skin is warm and dry. No ecchymosis and no rash noted. He is not diaphoretic.  Psychiatric: He has a normal mood  and affect.    ED Course  Procedures (including critical care time) Labs Review Results for orders placed during the hospital encounter of 01/04/14  BASIC METABOLIC PANEL      Result Value Ref Range   Sodium 144  137 - 147 mEq/L   Potassium 3.3 (*) 3.7 - 5.3 mEq/L   Chloride 105  96 - 112 mEq/L   CO2 26  19 - 32 mEq/L   Glucose, Bld 90  70 - 99 mg/dL   BUN 13  6 - 23 mg/dL   Creatinine, Ser 9.60 (*) 0.50 - 1.35 mg/dL   Calcium 9.3  8.4 - 45.4 mg/dL   GFR calc non Af Amer 50 (*) >90 mL/min   GFR calc Af Amer 58 (*) >90 mL/min   Anion gap 13  5 - 15  CBC WITH DIFFERENTIAL      Result Value Ref Range   WBC 8.9  4.0 - 10.5 K/uL   RBC 4.69  4.22 - 5.81 MIL/uL   Hemoglobin 14.4  13.0 - 17.0 g/dL   HCT 09.8  11.9 - 14.7 %   MCV 88.9  78.0 - 100.0 fL   MCH 30.7  26.0 - 34.0 pg   MCHC 34.5  30.0 - 36.0 g/dL   RDW 82.9  56.2 - 13.0 %   Platelets 234  150 - 400 K/uL   Neutrophils Relative % 61  43 - 77 %   Neutro Abs 5.4  1.7 - 7.7 K/uL   Lymphocytes Relative 20  12 - 46 %   Lymphs Abs 1.8  0.7 - 4.0 K/uL   Monocytes Relative 10  3 - 12 %   Monocytes Absolute 0.9  0.1 - 1.0 K/uL   Eosinophils Relative 9 (*) 0 - 5 %   Eosinophils Absolute 0.8 (*) 0.0 - 0.7 K/uL   Basophils Relative 0  0 - 1 %   Basophils Absolute 0.0  0.0 - 0.1 K/uL   URINALYSIS, ROUTINE W REFLEX MICROSCOPIC      Result Value Ref Range   Color, Urine YELLOW  YELLOW   APPearance CLEAR  CLEAR   Specific Gravity, Urine 1.015  1.005 - 1.030   pH 5.5  5.0 - 8.0   Glucose, UA NEGATIVE  NEGATIVE mg/dL   Hgb urine dipstick NEGATIVE  NEGATIVE   Bilirubin Urine NEGATIVE  NEGATIVE   Ketones, ur NEGATIVE  NEGATIVE mg/dL   Protein, ur NEGATIVE  NEGATIVE mg/dL   Urobilinogen, UA 0.2  0.0 - 1.0 mg/dL   Nitrite NEGATIVE  NEGATIVE   Leukocytes, UA SMALL (*) NEGATIVE  URINE MICROSCOPIC-ADD ON      Result Value Ref Range   Squamous Epithelial / LPF RARE  RARE   WBC, UA 7-10  <3 WBC/hpf   Bacteria, UA RARE  RARE   Dg Ribs Unilateral W/chest Right  01/04/2014   CLINICAL DATA:  Fall.  Pain.  EXAM: RIGHT RIBS AND CHEST - 3+ VIEW  COMPARISON:  03/22/2013.  09/12/2011.  FINDINGS: Stable mild prominence of the great vasculature and thoracic aorta. No interim change. Heart size stable. Hilar structures are unremarkable. Stable nipple shadows. No focal pulmonary infiltrate. Mild left base pleural parenchymal scarring. No pneumothorax.  Old left posterior sixth rib fracture. Nondisplaced right posterior lateral fifth and sixth rib fractures cannot be excluded. Degenerative changes thoracolumbar spine. Abdominal aortic aneurysm cannot be excluded. Aortic ultrasound suggested to further evaluate.  IMPRESSION: 1. Nondisplaced right posterior lateral fifth and sixth subtle rib fractures  cannot be excluded. No pneumothorax. 2. Abdominal aortic aneurysm cannot be excluded. Abdominal aortic ultrasound suggested to further evaluate .   Electronically Signed   By: Maisie Fus  Register   On: 01/04/2014 07:09   US Aorta  01/04/2014   CLINICAL DATA:  Status post fall ; known rib fracture ; possible abdominal aortic aneurysm  EXAM: ULTRASOUND OF ABDOMINAL AORTA  TECHNIQUE: Ultrasound examination of the abdominal aorta was performed to evaluate for abdominal aortic aneurysm.  COMPARISON:  Right rib  detail of January 04, 2014  FINDINGS: Abdominal Aorta  No aneurysm identified.  The maximal AP dimension occurs proximally and is 2.9 cm. The maximal transverse dimension also occurs proximally is 2.8 cm. A normal tapering caliber is seen to the mid and distal aorta. The right common iliac artery measures 1.4 cm in greatest dimension transversely and the left common iliac artery measures 1.5 cm in greatest transverse dimension.  IMPRESSION: There is no evidence of an abdominal aortic aneurysm.   Electronically Signed   By: David  [place]   On: 01/04/2014 08:22    Imaging Review No results found.   EKG Interpretation None      MDM   Final diagnoses:  Rib fracture, right, closed, initial encounter  Fall at home, initial encounter  Elevated serum creatinine   Patient presents with a fall at home, several falls over the last one month currently being evaluated by neurology. Patient has reproducible right rib pain with palpation, no flail chest, patient able to speak in complete sentences. Labs without concerning abnormalities.  X-ray shows nondisplaced fracture, no pneumothorax. Advises abdominal ultrasound to evaluate abdominal aorta. Abdominal US negative. Discussed lab results, imaging results, and treatment plan with the patient. Incentives spirometer given Return precautions given. Reports understanding and no other concerns at this time.  Patient is stable for discharge at this time. Meds given in ED:  Medications  morphine 4 MG/ML injection 4 mg (4 mg Intramuscular Given 01/04/14 0758)  fentaNYL (SUBLIMAZE) injection 25 mcg (25 mcg Intramuscular Given 01/04/14 0942)    Discharge Medication List as of 01/04/2014  8:49 AM    START taking these medications   Details  ibuprofen (ADVIL,MOTRIN) 800 MG tablet Take 1 tablet (800 mg total) by mouth 3 (three) times daily with meals., Starting 01/04/2014, Until Discontinued, Print    traMADol (ULTRAM) 50 MG tablet Take 1 tablet (50 mg total) by  mouth at bedtime as needed., Starting 01/04/2014, Until Discontinued, Print            Mellody Drown, PA-C 01/04/14 1609  Mellody Drown, PA-C 01/04/14 1610
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
## 2014-01-04 NOTE — ED Notes (Signed)
Requested UA sample from pt. Pt states he cannot provide sample at this time. Urinal left with pt.

## 2014-01-04 NOTE — ED Notes (Signed)
Patient attempting to call ride at the present time.

## 2014-01-04 NOTE — ED Notes (Signed)
US at bedside

## 2014-01-04 NOTE — Discharge Instructions (Signed)
Call for a follow up appointment with a Family or Primary Care Provider.  Return if Symptoms worsen.   Take medication as prescribed.  Ice your ribs 3-4 times a day. Use your incentive spirometer 4 times a day to prevent pneumonia.

## 2014-01-04 NOTE — ED Notes (Signed)
EMS called to home.  Found patient ambulatory.  Patient has complaints of a fall post 3 days. Patient also is complaining of right side rib pain.  Diminished lung sounds per PTAR

## 2014-01-04 NOTE — ED Provider Notes (Signed)
Medical screening examination/treatment/procedure(s) were performed by non-physician practitioner and as supervising physician I was immediately available for consultation/collaboration.   EKG Interpretation None        Hugo Lybrand, MD 01/04/14 2304 

## 2014-01-06 LAB — URINE CULTURE: Colony Count: 90000

## 2014-01-07 NOTE — Progress Notes (Signed)
Post ED Visit - Positive Culture Follow-up  Culture report reviewed by antimicrobial stewardship pharmacist:  Wes Dulaney, Pharm.D., BCPS  Celedonio Miyamoto, Pharm.D., BCPS  Georgina Pillion, [address].D., BCPS  [place], [address].D., BCPS, AAHIVP  Estella Husk, Pharm.D., BCPS, AAHIVP  Red Christians, Pharm.D.  Tennis Must, Pharm.D.  Positive Urine culture UA and Urine micro negative.  No dysuria. No treatment required.  Sallee Provencal 01/07/2014, 9:10 AM

## 2014-01-16 ENCOUNTER — Institutional Professional Consult (permissible substitution): Payer: Medicaid Other | Admitting: Internal Medicine

## 2014-01-19 ENCOUNTER — Telehealth: Payer: Self-pay | Admitting: Critical Care Medicine

## 2014-01-19 NOTE — Telephone Encounter (Signed)
Pt states that he was to have surgery to remove prosthetic device from his wrist today and they wouldn't do to due to irregular heartbeat and also pt c/o red welps all over body.  Pt instructed to call his primary care md at [facility] to discuss this since PW is his pulmonary dr.  Pt verbalized understanding.

## 2014-02-02 ENCOUNTER — Other Ambulatory Visit: Payer: Self-pay | Admitting: Neurology

## 2014-02-02 ENCOUNTER — Telehealth: Payer: Self-pay | Admitting: Neurology

## 2014-02-02 DIAGNOSIS — R2681 Unsteadiness on feet: Secondary | ICD-10-CM

## 2014-02-02 DIAGNOSIS — R531 Weakness: Secondary | ICD-10-CM

## 2014-02-02 NOTE — Telephone Encounter (Signed)
Please advise ASAP. Patient states been waiting a week but I do not see any other documentation of this. Thanks

## 2014-02-02 NOTE — Telephone Encounter (Signed)
Patient calling to state that he needs to have heavier sedation for his MRI, not just Valium. Please return call and advise, he's been waiting a week to hear back, please call and advise.

## 2014-02-02 NOTE — Telephone Encounter (Signed)
Discussed with patient. New orders have been placed for MRI with sedation at [facility].

## 2014-02-02 NOTE — Telephone Encounter (Signed)
Patient forgot to mention that he is already on Diazepam 30 mg a day. For more information, please return call.

## 2014-02-03 DIAGNOSIS — R29898 Other symptoms and signs involving the musculoskeletal system: Secondary | ICD-10-CM | POA: Insufficient documentation

## 2014-02-03 NOTE — Telephone Encounter (Signed)
Noted. He will receive IV sedation as required at the MRI.

## 2014-03-01 ENCOUNTER — Telehealth: Payer: Self-pay | Admitting: *Deleted
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
## 2014-03-02 ENCOUNTER — Encounter (HOSPITAL_COMMUNITY): Payer: Self-pay | Admitting: Pharmacy Technician

## 2014-03-08 ENCOUNTER — Encounter (HOSPITAL_COMMUNITY): Payer: Self-pay | Admitting: *Deleted

## 2014-03-08 NOTE — Progress Notes (Signed)
Pt stated that he does not have anyone to monitor him after his procedure, he lives alone, and his neighbors can only " look in on him."

## 2014-03-09 ENCOUNTER — Ambulatory Visit (HOSPITAL_COMMUNITY)
Admission: RE | Admit: 2014-03-09 | Discharge: 2014-03-09 | Disposition: A | Discharge status: Home / Self Care | Payer: Medicaid Other | Source: Ambulatory Visit | Attending: Neurology | Admitting: Neurology

## 2014-03-09 ENCOUNTER — Encounter (HOSPITAL_COMMUNITY)
Admission: RE | Disposition: A | Discharge status: Home / Self Care | Payer: Self-pay | Source: Ambulatory Visit | Attending: Neurology

## 2014-03-09 ENCOUNTER — Telehealth: Payer: Self-pay | Admitting: Neurology

## 2014-03-09 ENCOUNTER — Encounter (HOSPITAL_COMMUNITY): Payer: Self-pay | Admitting: Surgery

## 2014-03-09 ENCOUNTER — Encounter (HOSPITAL_COMMUNITY): Payer: Medicaid Other | Admitting: Anesthesiology

## 2014-03-09 ENCOUNTER — Ambulatory Visit (HOSPITAL_COMMUNITY): Payer: Medicaid Other | Admitting: Anesthesiology

## 2014-03-09 DIAGNOSIS — F329 Major depressive disorder, single episode, unspecified: Secondary | ICD-10-CM | POA: Diagnosis not present

## 2014-03-09 DIAGNOSIS — J449 Chronic obstructive pulmonary disease, unspecified: Secondary | ICD-10-CM | POA: Insufficient documentation

## 2014-03-09 DIAGNOSIS — R2689 Other abnormalities of gait and mobility: Secondary | ICD-10-CM | POA: Diagnosis not present

## 2014-03-09 DIAGNOSIS — Z87891 Personal history of nicotine dependence: Secondary | ICD-10-CM | POA: Insufficient documentation

## 2014-03-09 DIAGNOSIS — K279 Peptic ulcer, site unspecified, unspecified as acute or chronic, without hemorrhage or perforation: Secondary | ICD-10-CM | POA: Insufficient documentation

## 2014-03-09 DIAGNOSIS — F418 Other specified anxiety disorders: Secondary | ICD-10-CM | POA: Diagnosis not present

## 2014-03-09 DIAGNOSIS — R2681 Unsteadiness on feet: Secondary | ICD-10-CM

## 2014-03-09 DIAGNOSIS — R531 Weakness: Secondary | ICD-10-CM

## 2014-03-09 DIAGNOSIS — I10 Essential (primary) hypertension: Secondary | ICD-10-CM | POA: Insufficient documentation

## 2014-03-09 HISTORY — DX: Headache, unspecified: R51.9

## 2014-03-09 HISTORY — DX: Headache: R51

## 2014-03-09 HISTORY — PX: RADIOLOGY WITH ANESTHESIA: SHX6223

## 2014-03-09 LAB — CBC
HCT: 41.1 % (ref 39.0–52.0)
HEMOGLOBIN: 14.1 g/dL (ref 13.0–17.0)
MCH: 31.1 pg (ref 26.0–34.0)
MCHC: 34.3 g/dL (ref 30.0–36.0)
MCV: 90.5 fL (ref 78.0–100.0)
Platelets: 217 10*3/uL (ref 150–400)
RBC: 4.54 MIL/uL (ref 4.22–5.81)
RDW: 12.9 % (ref 11.5–15.5)
WBC: 8.9 10*3/uL (ref 4.0–10.5)

## 2014-03-09 LAB — BASIC METABOLIC PANEL
Anion gap: 9 (ref 5–15)
BUN: 13 mg/dL (ref 6–23)
CO2: 27 meq/L (ref 19–32)
Calcium: 8.7 mg/dL (ref 8.4–10.5)
Chloride: 104 mEq/L (ref 96–112)
Creatinine, Ser: 1.14 mg/dL (ref 0.50–1.35)
GFR calc Af Amer: 78 mL/min — ABNORMAL LOW (ref >90)
GFR, EST NON AFRICAN AMERICAN: 67 mL/min — AB (ref >90)
GLUCOSE: 91 mg/dL (ref 70–99)
POTASSIUM: 3.5 meq/L — AB (ref 3.7–5.3)
Sodium: 140 mEq/L (ref 137–147)

## 2014-03-09 SURGERY — RADIOLOGY WITH ANESTHESIA
Anesthesia: General

## 2014-03-09 MED ORDER — HYDROXYZINE HCL 25 MG PO TABS
25.0000 mg | ORAL_TABLET | Freq: Once | ORAL | Status: DC
  Filled 2014-03-09: qty 1

## 2014-03-09 NOTE — OR Nursing (Signed)
At 1130 updated Dr. Michelle Piperssey to patients stable condition and that I had learned from the patient that no one was available  to remain with him during the normal required recovery time. Dr. Michelle Piperssey requested that we contact provider who ordered MRI and procure orders to admit for overnight observation.   Contacted provider practice that originally ordered the MRI for this AM. Elspeth ChoPeter Sumner, DO (originally ordered MRI)  no longer is associated with the practice and was informed at 1210 that a provider would be contacting PACU with f/u orders. Had not been contacted by provider by 1300 and patient states he demands to be released AMA and to have IV site d/c'd immediatedly.  IV d/c by Holland CommonsMaria RN. I have contacted [facility] again and updated them to the patient's intent to leave and his dissatisfaction at lack of response. Dr. Michelle Piperssey also updated to patient's intent to leave AMA. Patient is given lunch while awaiting ride from [employer] transport.  Patient signs "AMA" release with some reservation, stating that he should be signing out "Against medical incompetence." Patient is stable upon release.

## 2014-03-09 NOTE — Anesthesia Preprocedure Evaluation (Addendum)
Anesthesia Evaluation  Patient identified by MRN, date of birth, ID band Patient awake    Reviewed: Allergy & Precautions, H&P , NPO status , Patient's Chart, lab work & pertinent test results  Airway Mallampati: II TM Distance: >3 FB     Dental  (+) Edentulous Lower, Edentulous Upper, Dental Advisory Given   Pulmonary asthma , COPDformer smoker,          Cardiovascular hypertension, Pt. on medications     Neuro/Psych  Headaches, Anxiety Depression    GI/Hepatic hiatal hernia, PUD, GERD-  Medicated and Controlled,(+) Hepatitis -, C  Endo/Other  negative endocrine ROS  Renal/GU Renal disease     Musculoskeletal  (+) Arthritis -,   Abdominal   Peds  Hematology negative hematology ROS (+)   Anesthesia Other Findings   Reproductive/Obstetrics                         Anesthesia Physical Anesthesia Plan  ASA: III  Anesthesia Plan: General   Post-op Pain Management:    Induction: Intravenous  Airway Management Planned: LMA  Additional Equipment:   Intra-op Plan:   Post-operative Plan: Extubation in OR  Informed Consent: I have reviewed the patients History and Physical, chart, labs and discussed the procedure including the risks, benefits and alternatives for the proposed anesthesia with the patient or authorized representative who has indicated his/her understanding and acceptance.     Plan Discussed with: CRNA and Surgeon  Anesthesia Plan Comments:        Anesthesia Quick Evaluation

## 2014-03-09 NOTE — Telephone Encounter (Signed)
I am not sure, what can I do to help?

## 2014-03-09 NOTE — Transfer of Care (Signed)
Immediate Anesthesia Transfer of Care Note  Patient: [name] [name]  Procedure(s) Performed: Procedure(s): MRI - Cervical and Lumbar without Contrast (N/A)  Patient Location: PACU  Anesthesia Type:General  Level of Consciousness: awake, alert  and oriented  Airway & Oxygen Therapy: Patient Spontanous Breathing  Post-op Assessment: Report given to PACU RN and Post -op Vital signs reviewed and stable  Post vital signs: Reviewed and stable  Complications: No apparent anesthesia complications

## 2014-03-09 NOTE — Anesthesia Postprocedure Evaluation (Signed)
Anesthesia Post Note  Patient: [name] [name]  Procedure(s) Performed: Procedure(s) (LRB): MRI - Cervical and Lumbar without Contrast (N/A)  Anesthesia type: general  Patient location: PACU  Post pain: Pain level controlled  Post assessment: Patient's Cardiovascular Status Stable  Last Vitals:  Filed Vitals:   03/09/14 1200  BP: 139/96  Pulse:   Temp:   Resp: 13    Post vital signs: Reviewed and stable  Level of consciousness: sedated  Complications: No apparent anesthesia complications. Pt signed out AMA, as he did not want to stay in the hospirtal and had no one to be home with him.

## 2014-03-09 NOTE — Discharge Instructions (Signed)

## 2014-03-09 NOTE — Telephone Encounter (Signed)
Dahlia BailiffRobin Roberts with [facility] @ [phone], stated pt had MRI ordered by Dr. Hosie PoissonSumner with general Anesthesia and has no one to stay with him tonight.  Radiologist suggested patient be admitted for overnight stay.  Patient is waiting on ride and Zella BallRobin is requesting a return call.

## 2014-03-10 ENCOUNTER — Encounter (HOSPITAL_COMMUNITY): Payer: Self-pay | Admitting: Radiology

## 2014-03-15 NOTE — Progress Notes (Signed)
Multiple levels of degeneration ("wear and tear") with mild spinal stenosis at several levels. Doesn't appear to need surgical attention at this time. Based on Dr. Minus BreedingSumner's notes, patient needs physical therapy and follow up appointment in our clinic to further evaluate. -VRP

## 2014-03-22 ENCOUNTER — Ambulatory Visit (INDEPENDENT_AMBULATORY_CARE_PROVIDER_SITE_OTHER): Payer: Medicaid Other | Admitting: Adult Health

## 2014-03-22 ENCOUNTER — Telehealth: Payer: Self-pay | Admitting: *Deleted

## 2014-03-22 ENCOUNTER — Encounter: Payer: Self-pay | Admitting: Adult Health

## 2014-03-22 VITALS — BP 138/84 | HR 97 | Temp 97.5°F | Ht 71.0 in | Wt 162.2 lb

## 2014-03-22 DIAGNOSIS — J4541 Moderate persistent asthma with (acute) exacerbation: Secondary | ICD-10-CM

## 2014-03-22 MED ORDER — PREDNISONE 10 MG PO TABS: ORAL_TABLET | ORAL | Status: DC

## 2014-03-22 NOTE — Assessment & Plan Note (Signed)
Exacerbation,   Plan  Prednisone taper over next week.  Mucinex DM As needed  Cough/congestion  Saline nasal rinses As needed   Continue on Dulera 2 puffs Twice daily  -rinse after use.  Please contact office for sooner follow up if symptoms do not improve or worsen or seek emergency care  Follow up Dr. Delford FieldWright  As planned

## 2014-03-22 NOTE — Patient Instructions (Signed)
Prednisone taper over next week.  Mucinex DM As needed  Cough/congestion  Saline nasal rinses As needed   Continue on Dulera 2 puffs Twice daily  -rinse after use.  Please contact office for sooner follow up if symptoms do not improve or worsen or seek emergency care  Follow up Dr. Delford FieldWright  As planned

## 2014-03-22 NOTE — Telephone Encounter (Signed)
Results per Dr. Marjory LiesPenumalli:  Multiple levels of degeneration ("wear and tear") with mild spinal stenosis at several levels. Doesn't appear to need surgical attention at this time. Based on Dr. Minus BreedingSumner's notes, patient needs physical therapy and follow up appointment in our clinic to further evaluate. -VRP I called and notified the patient and was able to schedule a follow up appointment with Dr. Marjory LiesPenumalli for 04/03/14, NP 30 minute slot.

## 2014-03-22 NOTE — Progress Notes (Signed)
   Subjective:    Patient ID: [name] [name], male    DOB: March 04, 1951, 63 y.o.   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 He's had worsening cough, wheezing, tightness. Cough is mainly productive with clear mucus. He denies any fever Denies f/c/s, n/v/d, hemoptysis, PND, leg swelling.Marland Kitchen. Has been using Mucinex without much relief. Patient had to use his rescue inhaler more frequently over the last several days. Cough is worse at night at times.    Review of Systems  Constitutional:   No  weight loss, night sweats,  Fevers, chills,  +fatigue, or  lassitude.  HEENT:   No headaches,  Difficulty swallowing,  Tooth/dental problems, or  Sore throat,                No sneezing, itching, ear ache,  +nasal congestion, post nasal drip,   CV:  No chest pain,  Orthopnea, PND, swelling in lower extremities, anasarca, dizziness, palpitations, syncope.   GI  No heartburn, indigestion, abdominal pain, nausea, vomiting, diarrhea, change in bowel habits, loss of appetite, bloody stools.   Resp:    No chest wall deformity  Skin: no rash or lesions.  GU: no dysuria, change in color of urine, no urgency or frequency.  No flank pain, no hematuria   MS:  No joint pain or swelling.  No decreased range of motion.  No back pain.  Psych:  No change in mood or affect. No depression or anxiety.  No memory loss.          Objective:   Physical Exam GEN: A/Ox3; pleasant , thin, blind , walks with walking stick   HEENT:  [place]/AT,  EACs-clear, TMs-wnl, NOSE-clear drainage  THROAT-clear, no lesions, no postnasal drip or exudate noted.   NECK:  Supple w/ fair ROM; no JVD; normal carotid impulses w/o bruits; no thyromegaly or nodules palpated; no lymphadenopathy.  RESP  Few exp wheezing .no accessory muscle use, no dullness to percussion  CARD:  RRR, no m/r/g  , no peripheral edema, pulses  intact, no cyanosis or clubbing.  GI:   Soft & nt; nml bowel sounds; no organomegaly or masses detected.  Musco: Warm bil, no deformities or joint swelling noted.   Neuro: alert, blind   Skin: Warm, chronic atopy  '       Assessment & Plan:

## 2014-04-03 ENCOUNTER — Ambulatory Visit: Payer: Self-pay | Admitting: Diagnostic Neuroimaging

## 2014-04-10 ENCOUNTER — Ambulatory Visit: Payer: Medicaid Other | Admitting: Diagnostic Neuroimaging

## 2014-04-11 ENCOUNTER — Ambulatory Visit: Payer: Medicaid Other | Attending: Orthopedic Surgery | Admitting: Occupational Therapy

## 2014-04-17 ENCOUNTER — Encounter: Payer: Self-pay | Admitting: Diagnostic Neuroimaging

## 2014-04-19 ENCOUNTER — Encounter: Payer: Self-pay | Admitting: Occupational Therapy

## 2014-04-19 ENCOUNTER — Ambulatory Visit: Payer: Medicaid Other | Attending: Orthopedic Surgery | Admitting: Occupational Therapy

## 2014-04-19 DIAGNOSIS — M25541 Pain in joints of right hand: Secondary | ICD-10-CM

## 2014-04-19 DIAGNOSIS — M25631 Stiffness of right wrist, not elsewhere classified: Secondary | ICD-10-CM | POA: Diagnosis present

## 2014-04-19 DIAGNOSIS — M79641 Pain in right hand: Secondary | ICD-10-CM | POA: Diagnosis present

## 2014-04-19 DIAGNOSIS — M25641 Stiffness of right hand, not elsewhere classified: Secondary | ICD-10-CM | POA: Diagnosis present

## 2014-04-19 NOTE — Therapy (Signed)
[facility] [address] [place], [place], [address] Phone: [phone]   Fax:  [phone]  Occupational Therapy Evaluation  Patient Details  Name: [name] [name] MRN: [id number] Date of Birth: Oct 13, 1950  Encounter Date: 04/19/2014      OT End of Session - 04/19/14 1226    Visit Number 1   Number of Visits 3   Authorization Type MCD   Authorization Time Period Awaiting approved time frame   Authorization - Visit Number 1   Authorization - Number of Visits 3   OT Start Time 1105   OT Stop Time 1210   OT Time Calculation (min) 65 min      Past Medical History  Diagnosis Date  . 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 Cough   . Esophageal reflux   . Osteopenia   . Personal history of other diseases of digestive system   . Contact dermatitis and other eczema, due to unspecified cause   . Unspecified sinusitis (chronic)   . Allergic rhinitis   . Unspecified asthma(493.90)   . COPD (chronic obstructive pulmonary disease)   . Hypertension   . Seasonal allergies   . Ulcerative colitis   . H/O hiatal hernia   . Arthritis     spine  . Anxiety   . Multiple open wounds     "from esczema- scratching"  . Multiple facial bone fractures   . 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 History of bronchitis   . Pneumonia     in past had several times  . Shortness of breath     "sometime"  . Chronic lower back pain   . Kidney stones   . Depression     patient constantly hitting left side of face "due to pain"  . Pancreatitis   . Headache     PMH: migraines    Past Surgical History  Procedure Laterality Date  . Litrotripsy    . Orif distal radius fracture  09/12/11    right  . Fracture surgery    . Metacarpal pinning      right hand  . Patella fracture surgery      right  . Tonsillectomy and adenoidectomy    . Eye surgery      3 Corneal transplant (bilateral eyes)  . Hardware  removal  10/09/2011    Procedure: HARDWARE REMOVAL;  Surgeon: Nadara Mustard, MD;  Location: [facility] OR;  Service: Orthopedics;  Laterality: Right;  Removal Deep Hardware Right Wrist, placement antibiotic beads.  . Orif wrist fracture Right 06/15/2013    Procedure: RIGHT RADIUS PSTEOTOMY, DISTAL ULNA RESECTION, AND DIGITAL FLEXOR LENGTHENING AS NEEDED;  Surgeon: Marlowe Shores, MD;  Location: [facility];  Service: Orthopedics;  Laterality: Right;  . Carpal tunnel release Right 06/15/2013    Procedure: RIGHT CARPAL TUNNEL RELEASE;  Surgeon: Marlowe Shores, MD;  Location: [facility];  Service: Orthopedics;  Laterality: Right;  . Hernia repair    . Colonoscopy w/ biopsies and polypectomy    . Radiology with anesthesia N/A 03/09/2014    Procedure: MRI - Cervical and Lumbar without Contrast;  Surgeon: Medication Radiologist, MD;  Location: [facility] OR;  Service: Radiology;  Laterality: N/A;    There were no vitals taken for this visit.  Visit Diagnosis:  Stiffness of wrist joint, right - Plan: Ot plan of care cert/re-cert  Stiffness of  hand joint, right - Plan: Ot plan of care cert/re-cert  Pain, joint, hand, right - Plan: Ot plan of care cert/re-cert      Subjective Assessment - 04/19/14 1110    Symptoms "I haven't had anything on my hand for 2 weeks since they took the stitches out"   Currently in Pain? Yes   Pain Score 8    Pain Location Hand   Pain Orientation Right   Pain Descriptors / Indicators Constant   Pain Type Chronic pain   Aggravating Factors  movement     Alleviating factors: pain medication     [facility] OT Assessment - 04/19/14 1215    Assessment   Diagnosis s/p Rt FCR/FCU lengthening ([id number]) on 03/20/14   Onset Date --  surgery on 03/20/14   Prior Therapy Feb 2015 s/p Rt CTR, osteotomy, and tendon lengthening   Precautions   Precautions Other (comment)  pt blind, to wear splint except for hygiene care   Required Braces or Orthoses Other  Brace/Splint   Written Expression   Dominant Hand Right  but has been using Lt as dominant for several years   Observation/Other Assessments   Observations Pt arrived unprotected from surgery and reports he has not had anything on wrist since removing stitches approx. 2 weeks ago   Skin Integrity redness over incision area and dorsal index/long MP's          OT Treatments/Exercises (OP) - 04/19/14 1222    Splinting   Splinting Pt was fitted for and fabricated a static splint (volar) to include wrist and MP's in max extension per MD orders. (IP's free after clarification w/ PA). Pt educated in splint wear and care and verbalized understanding.          OT Education - 04/19/14 1224    Education provided Yes   Education Details Splint wear and care (wear at all times except for hygiene care 1x/day until instructed otherwise by MD)   Person(s) Educated Patient   Methods Explanation;Demonstration  handout not provided because pt is blind and unable to read it   Comprehension Verbalized understanding;Returned demonstration  return demo of donning/doffing splint            OT Long Term Goals - 04/19/14 1231    OT LONG TERM GOAL #1   Title Independent w/ splint wear and care   Baseline Issued splint, may need adjustments   Time 8   Period Weeks   Status New   OT LONG TERM GOAL #2   Title Independent w/ HEP (if appropriate and cleared by MD)   Baseline N/A d/t current precautions   Time 8   Period Weeks   Status New          Plan - 04/19/14 1228    Clinical Impression Statement Pt s/p RT wrist FCR and FCU lengthening on 03/20/14. Pt was seen today for evaluation and splint fabrication   Rehab Potential Fair  Pt w/ no social support and legally blind making rehab potential only fair   OT Frequency --  3 visits   OT Duration --  over 8 week duration   OT Treatment/Interventions Self-care/ADL training;Splinting;Patient/family education;Therapeutic exercise;Manual  Therapy   Plan splint check and modifications prn                               Problem List Patient Active Problem List   Diagnosis Date Noted  . Weakness of  back 02/03/2014  . Weakness of both legs 02/03/2014  . Gait instability 12/01/2013  . Wrist joint infection 10/10/2011    Class: Diagnosis of  . Urinary retention 09/16/2011  . TACHYCARDIA 07/16/2010  . BLINDNESS 10/27/2008  . SINUSITIS, CHRONIC 08/03/2007  . ALLERGIC RHINITIS 08/03/2007  . Asthma 08/03/2007  . Esophageal reflux 08/03/2007  . ECZEMA 08/03/2007  . OSTEOPENIA 08/03/2007  . ULCERATIVE COLITIS, HX OF 08/03/2007    [name] [name], [name] [name] 04/19/2014, 12:38 PM  [name] [name], OTR/L 04/19/2014 12:41 PM Phone [phone] FAX [phone]

## 2014-05-09 ENCOUNTER — Encounter: Payer: Medicaid Other | Admitting: Occupational Therapy

## 2014-05-16 ENCOUNTER — Ambulatory Visit: Payer: Medicaid Other | Admitting: Occupational Therapy

## 2014-05-16 DIAGNOSIS — M25541 Pain in joints of right hand: Secondary | ICD-10-CM

## 2014-05-16 DIAGNOSIS — M25641 Stiffness of right hand, not elsewhere classified: Secondary | ICD-10-CM

## 2014-05-16 DIAGNOSIS — M25631 Stiffness of right wrist, not elsewhere classified: Secondary | ICD-10-CM | POA: Diagnosis not present

## 2014-05-16 NOTE — Therapy (Signed)
[facility] [address] [place], [place], [address] Phone: [phone]   Fax:  [phone]  Occupational Therapy Treatment  Patient Details  Name: [name] [name] MRN: [id number] Date of Birth: 1950/06/28  Encounter Date: 05/16/2014      OT End of Session - 05/16/14 0919    Visit Number 2   Number of Visits 3   Authorization Type MCD   Authorization Time Period 04/19/14 - 05/30/14   Authorization - Visit Number 2   Authorization - Number of Visits 3   OT Start Time 0805   OT Stop Time 0845   OT Time Calculation (min) 40 min   Activity Tolerance Patient limited by pain;Treatment limited secondary to agitation      Past Medical History  Diagnosis Date  . 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 Cough   . Esophageal reflux   . Osteopenia   . Personal history of other diseases of digestive system   . Contact dermatitis and other eczema, due to unspecified cause   . Unspecified sinusitis (chronic)   . Allergic rhinitis   . Unspecified asthma(493.90)   . COPD (chronic obstructive pulmonary disease)   . Hypertension   . Seasonal allergies   . Ulcerative colitis   . H/O hiatal hernia   . Arthritis     spine  . Anxiety   . Multiple open wounds     "from esczema- scratching"  . Multiple facial bone fractures   . 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 History of bronchitis   . Pneumonia     in past had several times  . Shortness of breath     "sometime"  . Chronic lower back pain   . Kidney stones   . Depression     patient constantly hitting left side of face "due to pain"  . Pancreatitis   . Headache     PMH: migraines    Past Surgical History  Procedure Laterality Date  . Litrotripsy    . Orif distal radius fracture  09/12/11    right  . Fracture surgery    . Metacarpal pinning      right hand  . Patella fracture surgery      right  . Tonsillectomy and  adenoidectomy    . Eye surgery      3 Corneal transplant (bilateral eyes)  . Hardware removal  10/09/2011    Procedure: HARDWARE REMOVAL;  Surgeon: Nadara MustardMarcus V Duda, MD;  [facility];  Service: Orthopedics;  Laterality: Right;  Removal Deep Hardware Right Wrist, placement antibiotic beads.  . Orif wrist fracture Right 06/15/2013    Procedure: RIGHT RADIUS PSTEOTOMY, DISTAL ULNA RESECTION, AND DIGITAL FLEXOR LENGTHENING AS NEEDED;  Surgeon: Marlowe ShoresMatthew A Weingold, MD;  Location: [facility];  Service: Orthopedics;  Laterality: Right;  . Carpal tunnel release Right 06/15/2013    Procedure: RIGHT CARPAL TUNNEL RELEASE;  Surgeon: Marlowe ShoresMatthew A Weingold, MD;  Location: [facility];  Service: Orthopedics;  Laterality: Right;  . Hernia repair    . Colonoscopy w/ biopsies and polypectomy    . Radiology with anesthesia N/A 03/09/2014    Procedure: MRI - Cervical and Lumbar without Contrast;  Surgeon: Medication Radiologist, MD;  Location: [facility] OR;  Service: Radiology;  Laterality: N/A;    There were no vitals taken for this visit.  Visit Diagnosis:  Stiffness of  wrist joint, right  Stiffness of hand joint, right  Pain, joint, hand, right      Subjective Assessment - 05/16/14 0910    Symptoms "I've just been wearing my other brace" (pre-fab wrist brace which doesn't include MP's)   Currently in Pain? Yes   Pain Score 7    Pain Location Hand   Pain Orientation Right   Pain Descriptors / Indicators Constant;Operative site guarding   Pain Type Chronic pain   Aggravating Factors  movement   Pain Relieving Factors pain meds                 OT Treatments/Exercises (OP) - 05/16/14 0001    ADLs   ADL Comments Pt questioned why he couldn't wear his pre-fab wrist brace vs. fabricated splint. Instructed pt that his pre-fab splint did not include MP's and the hand surgeon wanted MP's in full extension. (Pt has been wearing pre-fab wrist splint)   Splinting   Splinting  Revised splint by slightly rolling back volarly at proximal phalanx and adding gel padding to prevent blisters (secondary to MD sending order to revise splint). However, question if gel padding will stay on and also issued moleskin to apply if gel padding falls off. Unable to roll back more volarly at proximal phalanx secondary to it would no longer support the MP's in extension. Also padded at web space and provided extra strap across MP's (dorsally)                OT Education - 05/16/14 0916    Education provided Yes   Education Details Review of the purpose of fabricated splint vs. pre-fab splint    Person(s) Educated Patient   Methods Explanation   Comprehension Verbal cues required             OT Long Term Goals - 04/19/14 1231    OT LONG TERM GOAL #1   Title Independent w/ splint wear and care   Baseline Issued splint, may need adjustments   Time 8   Period Weeks   Status New   OT LONG TERM GOAL #2   Title Independent w/ HEP (if appropriate and cleared by MD)   Baseline N/A d/t current precautions   Time 8   Period Weeks   Status New               Plan - 05/16/14 0920    Clinical Impression Statement Pt not complying with splint wearing schedule secondary to reported discomfort. Pt has been wearing pre-fab wrist immobolization splint. Received order from MD to revise splint to prevent blisters volarly at proximal phalanx digits 2-4. Therapist revised as able, but question pt's carryover w/ splint wear and care.    Rehab Potential Fair  Pt w/ no social support and legally blind   Plan assess splint, check goals, and d/c. (issue A/ROM HEP if able)        Problem List Patient Active Problem List   Diagnosis Date Noted  . Weakness of back 02/03/2014  . Weakness of both legs 02/03/2014  . Gait instability 12/01/2013  . Wrist joint infection 10/10/2011    Class: Diagnosis of  . Urinary retention 09/16/2011  . TACHYCARDIA 07/16/2010  . BLINDNESS  10/27/2008  . SINUSITIS, CHRONIC 08/03/2007  . ALLERGIC RHINITIS 08/03/2007  . Asthma 08/03/2007  . Esophageal reflux 08/03/2007  . ECZEMA 08/03/2007  . OSTEOPENIA 08/03/2007  . ULCERATIVE COLITIS, HX OF 08/03/2007    Kelli ChurnBallie, Myleen Brailsford Johnson, OTR/L 05/16/2014, 9:25  AM  [place] [address] [place], [place], [address] Phone: [phone]   Fax:  [phone]

## 2014-05-17 ENCOUNTER — Ambulatory Visit (INDEPENDENT_AMBULATORY_CARE_PROVIDER_SITE_OTHER): Payer: Medicaid Other | Admitting: Critical Care Medicine

## 2014-05-17 ENCOUNTER — Encounter: Payer: Self-pay | Admitting: Critical Care Medicine

## 2014-05-17 VITALS — BP 102/60 | HR 94 | Temp 97.6°F | Ht 70.0 in | Wt 151.6 lb

## 2014-05-17 DIAGNOSIS — J4541 Moderate persistent asthma with (acute) exacerbation: Secondary | ICD-10-CM

## 2014-05-17 DIAGNOSIS — Z23 Encounter for immunization: Secondary | ICD-10-CM

## 2014-05-17 MED ORDER — PREDNISONE 10 MG PO TABS: ORAL_TABLET | ORAL | Status: DC

## 2014-05-17 MED ORDER — MONTELUKAST SODIUM 10 MG PO TABS: 10.0000 mg | ORAL_TABLET | Freq: Every day | ORAL | Status: DC

## 2014-05-17 MED ORDER — MOMETASONE FURO-FORMOTEROL FUM 200-5 MCG/ACT IN AERO: 2.0000 | INHALATION_SPRAY | Freq: Two times a day (BID) | RESPIRATORY_TRACT | Status: DC

## 2014-05-17 NOTE — Progress Notes (Signed)
Subjective:    Patient ID: [name] [name], male    DOB: 12/11/1950, 63 y.o.   MRN: [id number]  HPI Severe persistent asthma steroid dependent significant atopic features  05/17/2014 Chief Complaint  Patient presents with  . Acute Visit    pt c/o sob and sneezing for past 2 weeks. He denies cough,  wheezing.  Notes more dyspnea and sneezing.  Using rescue inhaler more.  Notes no real wheeze. No chest pain or fever. Notes Qhs dyspnea   Review of Systems  Constitutional:   No  weight loss, night sweats,  Fevers, chills,  +fatigue, or  lassitude.  HEENT:   No headaches,  Difficulty swallowing,  Tooth/dental problems, or  Sore throat,                No sneezing, itching, ear ache,  +nasal congestion, post nasal drip,   CV:  No chest pain,  Orthopnea, PND, swelling in lower extremities, anasarca, dizziness, palpitations, syncope.   GI  No heartburn, indigestion, abdominal pain, nausea, vomiting, diarrhea, change in bowel habits, loss of appetite, bloody stools.   Resp:    No chest wall deformity  Skin: no rash or lesions.  GU: no dysuria, change in color of urine, no urgency or frequency.  No flank pain, no hematuria   MS:  No joint pain or swelling.  No decreased range of motion.  No back pain.  Psych:  No change in mood or affect. No depression or anxiety.  No memory loss.          Objective:   Physical Exam GEN: A/Ox3; pleasant , thin, blind , walks with walking stick   HEENT:  [place]/AT,  EACs-clear, TMs-wnl, NOSE-clear drainage  THROAT-clear, no lesions, no postnasal drip or exudate noted.   NECK:  Supple w/ fair ROM; no JVD; normal carotid impulses w/o bruits; no thyromegaly or nodules palpated; no lymphadenopathy.  RESP  Few exp wheezing .no accessory muscle use, no dullness to percussion  CARD:  RRR, no m/r/g  , no peripheral edema, pulses intact, no cyanosis or clubbing.  GI:   Soft & nt; nml bowel sounds; no organomegaly or masses detected.  Musco: Warm  bil, no deformities or joint swelling noted.   Neuro: alert, blind   Skin: Warm, chronic atopy         Assessment & Plan:   Asthma Severe persistent asthma with acute inflammation Plan Pulse prednisone Continue other inhaled medications   Updated Medication List Outpatient Encounter Prescriptions as of 05/17/2014  Medication Sig  . albuterol (PROVENTIL HFA;VENTOLIN HFA) 108 (90 BASE) MCG/ACT inhaler Inhale 2 puffs into the lungs every 6 (six) hours as needed for wheezing or shortness of breath.  Marland Kitchen. albuterol (PROVENTIL) (2.5 MG/3ML) 0.083% nebulizer solution Take 2.5 mg by nebulization every 4 (four) hours as needed for wheezing or shortness of breath.  . Aspirin-Salicylamide-Caffeine (BC HEADACHE POWDER PO) Take 1 packet by mouth every 4 (four) hours as needed (pain).   . diazepam (VALIUM) 10 MG tablet Take 10 mg by mouth 3 (three) times daily.   . fluticasone (FLONASE) 50 MCG/ACT nasal spray Place 2 sprays into both nostrils daily.  . hydrOXYzine (ATARAX/VISTARIL) 25 MG tablet Take 25 mg by mouth 3 (three) times daily as needed for itching.   Marland Kitchen. ibuprofen (ADVIL,MOTRIN) 800 MG tablet Take 1 tablet (800 mg total) by mouth 3 (three) times daily with meals.  . mometasone-formoterol (DULERA) 200-5 MCG/ACT AERO Inhale 2 puffs into the lungs 2 (two)  times daily.  Marland Kitchen. omeprazole (PRILOSEC) 40 MG capsule Take 40 mg by mouth 2 (two) times daily.  . prednisoLONE acetate (PRED FORTE) 1 % ophthalmic suspension Place 1 drop into both eyes daily.   . sertraline (ZOLOFT) 100 MG tablet Take 200 mg by mouth daily.   . temazepam (RESTORIL) 15 MG capsule Take 15 mg by mouth at bedtime as needed for sleep.   Marland Kitchen. triamcinolone cream (KENALOG) 0.1 % Apply 1 application topically 2 (two) times daily.  . [DISCONTINUED] mometasone-formoterol (DULERA) 200-5 MCG/ACT AERO Inhale 2 puffs into the lungs 2 (two) times daily.  . [DISCONTINUED] predniSONE (DELTASONE) 10 MG tablet 4 tabs for 2 days, then 3 tabs for 2  days, 2 tabs for 2 days, then 1 tab for 2 days, then stop  . montelukast (SINGULAIR) 10 MG tablet Take 1 tablet (10 mg total) by mouth at bedtime.  . predniSONE (DELTASONE) 10 MG tablet Take 4 for three days 3 for three days 2 for three days 1 for three days and stop

## 2014-05-17 NOTE — Patient Instructions (Signed)
Flu vaccine was given Prednisone 10mg  Take 4 for three days 3 for three days 2 for three days 1 for three days and stop No other changes Return 4 months

## 2014-05-18 NOTE — Assessment & Plan Note (Signed)
Severe persistent asthma with acute inflammation Plan Pulse prednisone Continue other inhaled medications

## 2014-05-28 ENCOUNTER — Inpatient Hospital Stay (HOSPITAL_COMMUNITY)
Admission: EM | Admit: 2014-05-28 | Discharge: 2014-06-06 | DRG: 190 | Disposition: A | Discharge status: Home / Self Care | Payer: Medicaid Other | Attending: Internal Medicine | Admitting: Internal Medicine

## 2014-05-28 ENCOUNTER — Encounter (HOSPITAL_COMMUNITY): Payer: Self-pay | Admitting: Emergency Medicine

## 2014-05-28 ENCOUNTER — Emergency Department (HOSPITAL_COMMUNITY): Payer: Medicaid Other

## 2014-05-28 DIAGNOSIS — Z7289 Other problems related to lifestyle: Secondary | ICD-10-CM

## 2014-05-28 DIAGNOSIS — J4551 Severe persistent asthma with (acute) exacerbation: Secondary | ICD-10-CM | POA: Diagnosis present

## 2014-05-28 DIAGNOSIS — R05 Cough: Secondary | ICD-10-CM

## 2014-05-28 DIAGNOSIS — J441 Chronic obstructive pulmonary disease with (acute) exacerbation: Principal | ICD-10-CM | POA: Diagnosis present

## 2014-05-28 DIAGNOSIS — R739 Hyperglycemia, unspecified: Secondary | ICD-10-CM | POA: Diagnosis present

## 2014-05-28 DIAGNOSIS — J9600 Acute respiratory failure, unspecified whether with hypoxia or hypercapnia: Secondary | ICD-10-CM

## 2014-05-28 DIAGNOSIS — Z87891 Personal history of nicotine dependence: Secondary | ICD-10-CM

## 2014-05-28 DIAGNOSIS — J320 Chronic maxillary sinusitis: Secondary | ICD-10-CM | POA: Diagnosis present

## 2014-05-28 DIAGNOSIS — T380X5A Adverse effect of glucocorticoids and synthetic analogues, initial encounter: Secondary | ICD-10-CM | POA: Diagnosis present

## 2014-05-28 DIAGNOSIS — E876 Hypokalemia: Secondary | ICD-10-CM | POA: Diagnosis present

## 2014-05-28 DIAGNOSIS — F319 Bipolar disorder, unspecified: Secondary | ICD-10-CM | POA: Diagnosis present

## 2014-05-28 DIAGNOSIS — R0602 Shortness of breath: Secondary | ICD-10-CM | POA: Diagnosis present

## 2014-05-28 DIAGNOSIS — H5412 Blindness, left eye, low vision right eye: Secondary | ICD-10-CM | POA: Diagnosis present

## 2014-05-28 DIAGNOSIS — N182 Chronic kidney disease, stage 2 (mild): Secondary | ICD-10-CM | POA: Diagnosis present

## 2014-05-28 DIAGNOSIS — J45901 Unspecified asthma with (acute) exacerbation: Secondary | ICD-10-CM | POA: Diagnosis present

## 2014-05-28 DIAGNOSIS — R269 Unspecified abnormalities of gait and mobility: Secondary | ICD-10-CM | POA: Diagnosis present

## 2014-05-28 DIAGNOSIS — J0100 Acute maxillary sinusitis, unspecified: Secondary | ICD-10-CM | POA: Diagnosis present

## 2014-05-28 DIAGNOSIS — J3089 Other allergic rhinitis: Secondary | ICD-10-CM

## 2014-05-28 DIAGNOSIS — G894 Chronic pain syndrome: Secondary | ICD-10-CM | POA: Diagnosis present

## 2014-05-28 DIAGNOSIS — R053 Chronic cough: Secondary | ICD-10-CM | POA: Diagnosis present

## 2014-05-28 DIAGNOSIS — J9620 Acute and chronic respiratory failure, unspecified whether with hypoxia or hypercapnia: Secondary | ICD-10-CM | POA: Diagnosis present

## 2014-05-28 DIAGNOSIS — J45909 Unspecified asthma, uncomplicated: Secondary | ICD-10-CM | POA: Diagnosis present

## 2014-05-28 DIAGNOSIS — K219 Gastro-esophageal reflux disease without esophagitis: Secondary | ICD-10-CM | POA: Diagnosis present

## 2014-05-28 DIAGNOSIS — J019 Acute sinusitis, unspecified: Secondary | ICD-10-CM

## 2014-05-28 DIAGNOSIS — J309 Allergic rhinitis, unspecified: Secondary | ICD-10-CM | POA: Diagnosis present

## 2014-05-28 DIAGNOSIS — I129 Hypertensive chronic kidney disease with stage 1 through stage 4 chronic kidney disease, or unspecified chronic kidney disease: Secondary | ICD-10-CM | POA: Diagnosis present

## 2014-05-28 DIAGNOSIS — E440 Moderate protein-calorie malnutrition: Secondary | ICD-10-CM | POA: Diagnosis present

## 2014-05-28 DIAGNOSIS — J9621 Acute and chronic respiratory failure with hypoxia: Secondary | ICD-10-CM | POA: Diagnosis present

## 2014-05-28 DIAGNOSIS — Z79899 Other long term (current) drug therapy: Secondary | ICD-10-CM

## 2014-05-28 DIAGNOSIS — Z7952 Long term (current) use of systemic steroids: Secondary | ICD-10-CM

## 2014-05-28 DIAGNOSIS — Z7951 Long term (current) use of inhaled steroids: Secondary | ICD-10-CM

## 2014-05-28 LAB — CBC WITH DIFFERENTIAL/PLATELET
BASOS ABS: 0 10*3/uL (ref 0.0–0.1)
Basophils Relative: 0 % (ref 0–1)
EOS ABS: 0 10*3/uL (ref 0.0–0.7)
EOS PCT: 0 % (ref 0–5)
HCT: 41.2 % (ref 39.0–52.0)
Hemoglobin: 13.8 g/dL (ref 13.0–17.0)
Lymphocytes Relative: 11 % — ABNORMAL LOW (ref 12–46)
Lymphs Abs: 0.9 10*3/uL (ref 0.7–4.0)
MCH: 30.1 pg (ref 26.0–34.0)
MCHC: 33.5 g/dL (ref 30.0–36.0)
MCV: 90 fL (ref 78.0–100.0)
MONOS PCT: 7 % (ref 3–12)
Monocytes Absolute: 0.6 10*3/uL (ref 0.1–1.0)
NEUTROS ABS: 7.2 10*3/uL (ref 1.7–7.7)
NEUTROS PCT: 82 % — AB (ref 43–77)
Platelets: 174 10*3/uL (ref 150–400)
RBC: 4.58 MIL/uL (ref 4.22–5.81)
RDW: 13.7 % (ref 11.5–15.5)
WBC: 8.8 10*3/uL (ref 4.0–10.5)

## 2014-05-28 LAB — BASIC METABOLIC PANEL
Anion gap: 4 — ABNORMAL LOW (ref 5–15)
BUN: 21 mg/dL (ref 6–23)
CO2: 25 mmol/L (ref 19–32)
Calcium: 8.7 mg/dL (ref 8.4–10.5)
Chloride: 111 mEq/L (ref 96–112)
Creatinine, Ser: 0.99 mg/dL (ref 0.50–1.35)
GFR calc Af Amer: >90 mL/min (ref >90)
GFR, EST NON AFRICAN AMERICAN: 85 mL/min — AB (ref >90)
GLUCOSE: 111 mg/dL — AB (ref 70–99)
Potassium: 2.7 mmol/L — CL (ref 3.5–5.1)
Sodium: 140 mmol/L (ref 135–145)

## 2014-05-28 MED ORDER — SODIUM CHLORIDE 0.9 % IV SOLN
250.0000 mL | INTRAVENOUS | Status: DC | PRN
  Administered 2014-06-06: 250 mL via INTRAVENOUS

## 2014-05-28 MED ORDER — METHYLPREDNISOLONE SODIUM SUCC 125 MG IJ SOLR
125.0000 mg | Freq: Once | INTRAMUSCULAR | Status: AC
  Administered 2014-05-28: 125 mg via INTRAVENOUS
  Filled 2014-05-28: qty 2

## 2014-05-28 MED ORDER — IPRATROPIUM BROMIDE 0.02 % IN SOLN
0.5000 mg | Freq: Four times a day (QID) | RESPIRATORY_TRACT | Status: DC
  Administered 2014-05-29 (×3): 0.5 mg via RESPIRATORY_TRACT
  Filled 2014-05-28 (×3): qty 2.5

## 2014-05-28 MED ORDER — OXYCODONE HCL 5 MG PO TABS
10.0000 mg | ORAL_TABLET | Freq: Once | ORAL | Status: AC
  Administered 2014-05-28: 10 mg via ORAL
  Filled 2014-05-28: qty 2

## 2014-05-28 MED ORDER — IPRATROPIUM BROMIDE 0.02 % IN SOLN
0.5000 mg | Freq: Once | RESPIRATORY_TRACT | Status: AC
  Administered 2014-05-28: 0.5 mg via RESPIRATORY_TRACT
  Filled 2014-05-28: qty 2.5

## 2014-05-28 MED ORDER — ALBUTEROL (5 MG/ML) CONTINUOUS INHALATION SOLN
10.0000 mg/h | INHALATION_SOLUTION | Freq: Once | RESPIRATORY_TRACT | Status: AC
  Administered 2014-05-28: 10 mg/h via RESPIRATORY_TRACT
  Filled 2014-05-28: qty 20

## 2014-05-28 MED ORDER — GUAIFENESIN ER 600 MG PO TB12
600.0000 mg | ORAL_TABLET | Freq: Two times a day (BID) | ORAL | Status: DC
  Administered 2014-05-29 – 2014-06-06 (×18): 600 mg via ORAL
  Filled 2014-05-28 (×19): qty 1

## 2014-05-28 MED ORDER — POTASSIUM CHLORIDE CRYS ER 20 MEQ PO TBCR
40.0000 meq | EXTENDED_RELEASE_TABLET | Freq: Once | ORAL | Status: AC
  Administered 2014-05-28: 40 meq via ORAL
  Filled 2014-05-28: qty 2

## 2014-05-28 MED ORDER — SODIUM CHLORIDE 0.9 % IJ SOLN
3.0000 mL | Freq: Two times a day (BID) | INTRAMUSCULAR | Status: DC
  Administered 2014-05-31 – 2014-06-05 (×5): 3 mL via INTRAVENOUS

## 2014-05-28 MED ORDER — ONDANSETRON HCL 4 MG/2ML IJ SOLN: 4.0000 mg | Freq: Four times a day (QID) | INTRAMUSCULAR | Status: DC | PRN

## 2014-05-28 MED ORDER — SODIUM CHLORIDE 0.9 % IJ SOLN: 3.0000 mL | INTRAMUSCULAR | Status: DC | PRN

## 2014-05-28 MED ORDER — AZITHROMYCIN 500 MG PO TABS
500.0000 mg | ORAL_TABLET | Freq: Every day | ORAL | Status: AC
Start: 2014-05-29 — End: 2014-05-29
  Administered 2014-05-29: 500 mg via ORAL
  Filled 2014-05-28: qty 1

## 2014-05-28 MED ORDER — POTASSIUM CHLORIDE 10 MEQ/100ML IV SOLN
10.0000 meq | Freq: Once | INTRAVENOUS | Status: AC
  Administered 2014-05-28: 10 meq via INTRAVENOUS
  Filled 2014-05-28: qty 100

## 2014-05-28 MED ORDER — METHYLPREDNISOLONE SODIUM SUCC 125 MG IJ SOLR
80.0000 mg | Freq: Two times a day (BID) | INTRAMUSCULAR | Status: DC
  Administered 2014-05-29: 80 mg via INTRAVENOUS
  Filled 2014-05-28 (×2): qty 1.28

## 2014-05-28 MED ORDER — AZITHROMYCIN 250 MG PO TABS
250.0000 mg | ORAL_TABLET | Freq: Every day | ORAL | Status: AC
  Administered 2014-05-29 – 2014-06-01 (×4): 250 mg via ORAL
  Filled 2014-05-28 (×4): qty 1

## 2014-05-28 MED ORDER — ALBUTEROL SULFATE (2.5 MG/3ML) 0.083% IN NEBU
2.5000 mg | INHALATION_SOLUTION | Freq: Four times a day (QID) | RESPIRATORY_TRACT | Status: DC
  Administered 2014-05-29 (×3): 2.5 mg via RESPIRATORY_TRACT
  Filled 2014-05-28 (×3): qty 3

## 2014-05-28 MED ORDER — ONDANSETRON HCL 4 MG PO TABS
4.0000 mg | ORAL_TABLET | Freq: Four times a day (QID) | ORAL | Status: DC | PRN
Start: 2014-05-28 — End: 2014-06-06

## 2014-05-28 MED ORDER — ALBUTEROL SULFATE (2.5 MG/3ML) 0.083% IN NEBU
2.5000 mg | INHALATION_SOLUTION | RESPIRATORY_TRACT | Status: DC | PRN
  Administered 2014-05-29 – 2014-06-02 (×4): 2.5 mg via RESPIRATORY_TRACT
  Filled 2014-05-28 (×6): qty 3

## 2014-05-28 MED ORDER — ALBUTEROL (5 MG/ML) CONTINUOUS INHALATION SOLN: 10.0000 mg/h | INHALATION_SOLUTION | Freq: Once | RESPIRATORY_TRACT | Status: DC

## 2014-05-28 NOTE — H&P (Signed)
PCP:   Gwynneth AlimentSANDERS,ROBYN N, MD   Chief Complaint:  Sob, wheezing  HPI: 64 yo male comes in with several days of worsening sob, wheezing and cough.  Has been using his albuterol a lot at home and not getting better.  No fevers.  No n/v.  No le edema or swelling.  No chest pain.  Does not smoke cigerettes but does pot frequently.  Review of Systems:  Positive and negative as per HPI otherwise all other systems are negative  Past Medical History: Past Medical History  Diagnosis Date  . 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 Cough   . Esophageal reflux   . Osteopenia   . Personal history of other diseases of digestive system   . Contact dermatitis and other eczema, due to unspecified cause   . Unspecified sinusitis (chronic)   . Allergic rhinitis   . Unspecified asthma(493.90)   . COPD (chronic obstructive pulmonary disease)   . Hypertension   . Seasonal allergies   . Ulcerative colitis   . H/O hiatal hernia   . Arthritis     spine  . Anxiety   . Multiple open wounds     "from esczema- scratching"  . Multiple facial bone fractures   . 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 History of bronchitis   . Pneumonia     in past had several times  . Shortness of breath     "sometime"  . Chronic lower back pain   . Kidney stones   . Depression     patient constantly hitting left side of face "due to pain"  . Pancreatitis   . Headache     PMH: migraines   Past Surgical History  Procedure Laterality Date  . Litrotripsy    . Orif distal radius fracture  09/12/11    right  . Fracture surgery    . Metacarpal pinning      right hand  . Patella fracture surgery      right  . Tonsillectomy and adenoidectomy    . Eye surgery      3 Corneal transplant (bilateral eyes)  . Hardware removal  10/09/2011    Procedure: HARDWARE REMOVAL;  Surgeon: Nadara MustardMarcus V Duda, MD;  [facility];  Service: Orthopedics;  Laterality: Right;  Removal Deep Hardware  Right Wrist, placement antibiotic beads.  . Orif wrist fracture Right 06/15/2013    Procedure: RIGHT RADIUS PSTEOTOMY, DISTAL ULNA RESECTION, AND DIGITAL FLEXOR LENGTHENING AS NEEDED;  Surgeon: Marlowe ShoresMatthew A Weingold, MD;  Location: [facility];  Service: Orthopedics;  Laterality: Right;  . Carpal tunnel release Right 06/15/2013    Procedure: RIGHT CARPAL TUNNEL RELEASE;  Surgeon: Marlowe ShoresMatthew A Weingold, MD;  Location: [facility];  Service: Orthopedics;  Laterality: Right;  . Hernia repair    . Colonoscopy w/ biopsies and polypectomy    . Radiology with anesthesia N/A 03/09/2014    Procedure: MRI - Cervical and Lumbar without Contrast;  Surgeon: Medication Radiologist, MD;  Location: [facility] OR;  Service: Radiology;  Laterality: N/A;    Medications: Prior to Admission medications   Medication Sig Start Date End Date Taking? Authorizing Provider  albuterol (PROVENTIL HFA;VENTOLIN HFA) 108 (90 BASE) MCG/ACT inhaler Inhale 2 puffs into the lungs every 6 (six) hours as needed for wheezing or shortness of breath (wheezing & shortness of breath).    Yes Historical Provider, MD  albuterol (  PROVENTIL) (2.5 MG/3ML) 0.083% nebulizer solution Take 2.5 mg by nebulization every 4 (four) hours as needed for wheezing or shortness of breath (wheezing & shortness of breath).    Yes Historical Provider, MD  Aspirin-Salicylamide-Caffeine (BC HEADACHE POWDER PO) Take 1 packet by mouth every 4 (four) hours as needed (pain).    Yes Historical Provider, MD  desoximetasone (TOPICORT) 0.05 % cream Apply 1 application topically 2 (two) times daily as needed (rash).    Yes Historical Provider, MD  desoximetasone (TOPICORT) 0.25 % cream Apply 1 application topically 2 (two) times daily as needed (rash).    Yes Historical Provider, MD  diazepam (VALIUM) 10 MG tablet Take 10 mg by mouth 3 (three) times daily.    Yes Historical Provider, MD  fluticasone (FLONASE) 50 MCG/ACT nasal spray Place 2 sprays into both  nostrils daily.   Yes Historical Provider, MD  hydrOXYzine (ATARAX/VISTARIL) 25 MG tablet Take 25 mg by mouth 3 (three) times daily as needed for itching (itching).    Yes Historical Provider, MD  ibuprofen (ADVIL,MOTRIN) 800 MG tablet Take 1 tablet (800 mg total) by mouth 3 (three) times daily with meals. 01/04/14  Yes Lauren Jimmey Ralph, PA-C  mometasone-formoterol (DULERA) 200-5 MCG/ACT AERO Inhale 2 puffs into the lungs 2 (two) times daily. 05/17/14  Yes Storm Frisk, MD  montelukast (SINGULAIR) 10 MG tablet Take 1 tablet (10 mg total) by mouth at bedtime. 05/17/14  Yes Storm Frisk, MD  omeprazole (PRILOSEC) 40 MG capsule Take 40 mg by mouth 2 (two) times daily.   Yes Historical Provider, MD  Oxycodone HCl 10 MG TABS Take 10 mg by mouth every 6 (six) hours as needed (pain).   Yes Historical Provider, MD  prednisoLONE acetate (PRED FORTE) 1 % ophthalmic suspension Place 1 drop into both eyes daily.    Yes Historical Provider, MD  predniSONE (DELTASONE) 10 MG tablet Take 4 for three days 3 for three days 2 for three days 1 for three days and stop 05/17/14  Yes Storm Frisk, MD  sertraline (ZOLOFT) 100 MG tablet Take 200 mg by mouth daily.    Yes Historical Provider, MD  triamcinolone cream (KENALOG) 0.1 % Apply 1 application topically 2 (two) times daily. 03/03/14  Yes Historical Provider, MD    Allergies:   Allergies  Allergen Reactions  . Bacitracin-Polymyxin B Other (See Comments)    Polysporin allergy per opthalmologist.   . Bee Venom Anaphylaxis  . Codeine Other (See Comments)    Makes patient clear throat   . Norco [Hydrocodone-Acetaminophen] Other (See Comments)    causes frontal headaches  . Penicillins Other (See Comments)    Childhood allergy/ makes patient clear throat a lot.  . Tape Hives and Other (See Comments)    "plastic tape"  . Chlorhexidine Itching and Rash    Social History:  reports that he quit smoking about 31 years ago. His smoking use included  Cigarettes and Pipe. He has a 3 pack-year smoking history. He has never used smokeless tobacco. He reports that he drinks alcohol. He reports that he uses illicit drugs (Marijuana).  Family History: Family History  Problem Relation Age of Onset  . Allergies Mother     atopic  . Asthma Mother   . Heart failure Mother     CHF  . Other Father   . Anesthesia problems Neg Hx     Physical Exam: Filed Vitals:   05/28/14 2002 05/28/14 2033 05/28/14 2249  BP: 139/104  155/81  Pulse: 100  103  Temp: 97.5 F (36.4 C)  97.6 F (36.4 C)  TempSrc: Oral  Oral  Resp: 22  24  SpO2: 100% 97% 95%   General appearance: alert, cooperative and no distress Head: Normocephalic, without obvious abnormality, atraumatic Eyes: negative Nose: Nares normal. Septum midline. Mucosa normal. No drainage or sinus tenderness. Neck: no carotid bruit, no JVD and supple, symmetrical, trachea midline Lungs: wheezes bilaterally Heart: regular rate and rhythm, S1, S2 normal, no murmur, click, rub or gallop Abdomen: soft, non-tender; bowel sounds normal; no masses,  no organomegaly Extremities: extremities normal, atraumatic, no cyanosis or edema Pulses: 2+ and symmetric Skin: Skin color, texture, turgor normal. No rashes or lesions Neurologic: Grossly normal  Labs on Admission:   Recent Labs  05/28/14 2027  NA 140  K 2.7*  CL 111  CO2 25  GLUCOSE 111*  BUN 21  CREATININE 0.99  CALCIUM 8.7    Recent Labs  05/28/14 2027  WBC 8.8  NEUTROABS 7.2  HGB 13.8  HCT 41.2  MCV 90.0  PLT 174   Radiological Exams on Admission: Dg Chest Port 1 View  05/28/2014   CLINICAL DATA:  Initial evaluation for shortness of breath cough congestion for 3 days history of COPD asthma former smoker hiatal hernia pneumonia bronchitis  EXAM: PORTABLE CHEST - 1 VIEW  COMPARISON:  Multiple prior studies  FINDINGS: Hyperinflation consistent with COPD. Markedly on coiled aorta. Heart size and vascular pattern normal. Prominent  vasculature to the right of the trachea. Lungs clear. No effusions.  IMPRESSION: Stable findings when comparing back to 2011.   Electronically Signed   By: Esperanza Heir M.D.   On: 05/28/2014 20:48    Assessment/Plan  64 yo male with copde  Principal Problem:   COPD exacerbation-  freq nebs.  Solumedrol iv.  Zpack.  cxr neg infiltrate.  obs on medical bed.  Afvss.  Oxygen sats nml on RA.  Active Problems:  Stable unless o/w noted   Allergic rhinitis   Esophageal reflux   Hypokalemia-  repleted thru iv and po in ED.  Repeat level in am.  Likely from using albuterol at home.   SOB (shortness of breath)  obs on medical.  Full code.  Chenoa Luddy A 05/28/2014, 11:03 PM

## 2014-05-28 NOTE — ED Notes (Signed)
Pt. On cardiac monitor. 

## 2014-05-28 NOTE — ED Notes (Signed)
Bed: WA06 Expected date: 05/28/14 Expected time: 7:37 PM Means of arrival: Ambulance Comments: 64 yo M  resp difficulty

## 2014-05-28 NOTE — ED Provider Notes (Signed)
CSN: [id number]     Arrival date & time 05/28/14  1955 History   First MD Initiated Contact with Patient 05/28/14 2002     Chief Complaint  Patient presents with  . Shortness of Breath     (Consider location/radiation/quality/duration/timing/severity/associated sxs/prior Treatment) HPI  64 year old male presents with dyspnea and cough for the past 3 days. States he has a mildly productive cough with white sputum. Denies any fevers. No congestion. This feels like prior exacerbations of his asthma/COPD. He's been taking his albuterol and nebulizer at home with minimal relief. EMS gave him 2 breathing treatments with some relief but still feels dyspneic. Has been on 10 mg of steroids per day over the past 3 days since seeing his pulmonologist for what was described as allergy flareup. No chest pain. Coughing is exacerbating his chronic L1 fracture.  Past Medical History  Diagnosis Date  . 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 Cough   . Esophageal reflux   . Osteopenia   . Personal history of other diseases of digestive system   . Contact dermatitis and other eczema, due to unspecified cause   . Unspecified sinusitis (chronic)   . Allergic rhinitis   . Unspecified asthma(493.90)   . COPD (chronic obstructive pulmonary disease)   . Hypertension   . Seasonal allergies   . Ulcerative colitis   . H/O hiatal hernia   . Arthritis     spine  . Anxiety   . Multiple open wounds     "from esczema- scratching"  . Multiple facial bone fractures   . 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 History of bronchitis   . Pneumonia     in past had several times  . Shortness of breath     "sometime"  . Chronic lower back pain   . Kidney stones   . Depression     patient constantly hitting left side of face "due to pain"  . Pancreatitis   . Headache     PMH: migraines   Past Surgical History  Procedure Laterality Date  . Litrotripsy    . Orif distal  radius fracture  09/12/11    right  . Fracture surgery    . Metacarpal pinning      right hand  . Patella fracture surgery      right  . Tonsillectomy and adenoidectomy    . Eye surgery      3 Corneal transplant (bilateral eyes)  . Hardware removal  10/09/2011    Procedure: HARDWARE REMOVAL;  Surgeon: Nadara MustardMarcus V Duda, MD;  [facility];  Service: Orthopedics;  Laterality: Right;  Removal Deep Hardware Right Wrist, placement antibiotic beads.  . Orif wrist fracture Right 06/15/2013    Procedure: RIGHT RADIUS PSTEOTOMY, DISTAL ULNA RESECTION, AND DIGITAL FLEXOR LENGTHENING AS NEEDED;  Surgeon: Marlowe ShoresMatthew A Weingold, MD;  Location: [facility];  Service: Orthopedics;  Laterality: Right;  . Carpal tunnel release Right 06/15/2013    Procedure: RIGHT CARPAL TUNNEL RELEASE;  Surgeon: Marlowe ShoresMatthew A Weingold, MD;  Location: [facility];  Service: Orthopedics;  Laterality: Right;  . Hernia repair    . Colonoscopy w/ biopsies and polypectomy    . Radiology with anesthesia N/A 03/09/2014    Procedure: MRI - Cervical and Lumbar without Contrast;  Surgeon: Medication Radiologist, MD;  Location: [facility] OR;  Service: Radiology;  Laterality: N/A;   Family History  Problem Relation Age of Onset  . Allergies Mother     atopic  . Asthma Mother   . Heart failure Mother     CHF  . Other Father   . Anesthesia problems Neg Hx    History  Substance Use Topics  . Smoking status: Former Smoker -- 1.00 packs/day for 3 years    Types: Cigarettes, Pipe    Quit date: 05/20/1983  . Smokeless tobacco: Never Used  . Alcohol Use: Yes     Comment: "scotch once a year"    Review of Systems  Constitutional: Negative for fever.  Respiratory: Positive for cough, shortness of breath and wheezing.   Cardiovascular: Negative for chest pain.  Musculoskeletal: Positive for back pain.  All other systems reviewed and are negative.     Allergies  Bacitracin-polymyxin b; Bee venom; Codeine; Norco;  Penicillins; Tape; and Chlorhexidine  Home Medications   Prior to Admission medications   Medication Sig Start Date End Date Taking? Authorizing Provider  albuterol (PROVENTIL HFA;VENTOLIN HFA) 108 (90 BASE) MCG/ACT inhaler Inhale 2 puffs into the lungs every 6 (six) hours as needed for wheezing or shortness of breath (wheezing & shortness of breath).    Yes Historical Provider, MD  albuterol (PROVENTIL) (2.5 MG/3ML) 0.083% nebulizer solution Take 2.5 mg by nebulization every 4 (four) hours as needed for wheezing or shortness of breath (wheezing & shortness of breath).    Yes Historical Provider, MD  ibuprofen (ADVIL,MOTRIN) 800 MG tablet Take 1 tablet (800 mg total) by mouth 3 (three) times daily with meals. 01/04/14  Yes Mellody Drown, PA-C  Oxycodone HCl 10 MG TABS Take 10 mg by mouth every 6 (six) hours as needed (pain).   Yes Historical Provider, MD  Aspirin-Salicylamide-Caffeine (BC HEADACHE POWDER PO) Take 1 packet by mouth every 4 (four) hours as needed (pain).     Historical Provider, MD  diazepam (VALIUM) 10 MG tablet Take 10 mg by mouth 3 (three) times daily.     Historical Provider, MD  fluticasone (FLONASE) 50 MCG/ACT nasal spray Place 2 sprays into both nostrils daily.    Historical Provider, MD  hydrOXYzine (ATARAX/VISTARIL) 25 MG tablet Take 25 mg by mouth 3 (three) times daily as needed for itching.     Historical Provider, MD  mometasone-formoterol (DULERA) 200-5 MCG/ACT AERO Inhale 2 puffs into the lungs 2 (two) times daily. 05/17/14   Storm Frisk, MD  montelukast (SINGULAIR) 10 MG tablet Take 1 tablet (10 mg total) by mouth at bedtime. 05/17/14   Storm Frisk, MD  omeprazole (PRILOSEC) 40 MG capsule Take 40 mg by mouth 2 (two) times daily.    Historical Provider, MD  prednisoLONE acetate (PRED FORTE) 1 % ophthalmic suspension Place 1 drop into both eyes daily.     Historical Provider, MD  predniSONE (DELTASONE) 10 MG tablet Take 4 for three days 3 for three days 2 for  three days 1 for three days and stop 05/17/14   Storm Frisk, MD  sertraline (ZOLOFT) 100 MG tablet Take 200 mg by mouth daily.     Historical Provider, MD  temazepam (RESTORIL) 15 MG capsule Take 15 mg by mouth at bedtime as needed for sleep.     Historical Provider, MD  triamcinolone cream (KENALOG) 0.1 % Apply 1 application topically 2 (two) times daily. 03/03/14   Historical Provider, MD   BP 139/104 mmHg  Pulse 100  Temp(Src) 97.5 F (36.4 C) (Oral)  Resp 22  SpO2 100% Physical Exam  Constitutional: He is oriented to person, place, and time. He appears well-developed and well-nourished.  HENT:  Head: Normocephalic and atraumatic.  Right Ear: External ear normal.  Left Ear: External ear normal.  Nose: Nose normal.  Eyes: Right eye exhibits no discharge. Left eye exhibits no discharge.  Neck: Neck supple.  Cardiovascular: Regular rhythm, normal heart sounds and intact distal pulses.  Tachycardia present.   Pulmonary/Chest: Tachypnea noted. No respiratory distress. He has wheezes.  Abdominal: Soft. There is no tenderness.  Musculoskeletal: He exhibits no edema.  Neurological: He is alert and oriented to person, place, and time.  Skin: Skin is warm and dry.  Nursing note and vitals reviewed.   ED Course  Procedures (including critical care time) Labs Review Labs Reviewed  BASIC METABOLIC PANEL - Abnormal; Notable for the following:    Potassium 2.7 (*)    Glucose, Bld 111 (*)    GFR calc non Af Amer 85 (*)    Anion gap 4 (*)    All other components within normal limits  CBC WITH DIFFERENTIAL - Abnormal; Notable for the following:    Neutrophils Relative % 82 (*)    Lymphocytes Relative 11 (*)    All other components within normal limits    Imaging Review Dg Chest Port 1 View  05/28/2014   CLINICAL DATA:  Initial evaluation for shortness of breath cough congestion for 3 days history of COPD asthma former smoker hiatal hernia pneumonia bronchitis  EXAM: PORTABLE  CHEST - 1 VIEW  COMPARISON:  Multiple prior studies  FINDINGS: Hyperinflation consistent with COPD. Markedly on coiled aorta. Heart size and vascular pattern normal. Prominent vasculature to the right of the trachea. Lungs clear. No effusions.  IMPRESSION: Stable findings when comparing back to 2011.   Electronically Signed   By: Esperanza Heir M.D.   On: 05/28/2014 20:48     EKG Interpretation None      CRITICAL CARE Performed by: Pricilla Loveless T   Total critical care time: 30 minutes  Critical care time was exclusive of separately billable procedures and treating other patients.  Critical care was necessary to treat or prevent imminent or life-threatening deterioration.  Critical care was time spent personally by me on the following activities: development of treatment plan with patient and/or surrogate as well as nursing, discussions with consultants, evaluation of patient's response to treatment, examination of patient, obtaining history from patient or surrogate, ordering and performing treatments and interventions, ordering and review of laboratory studies, ordering and review of radiographic studies, pulse oximetry and re-evaluation of patient's condition.  MDM   Final diagnoses:  COPD exacerbation  Hypokalemia    Patient with a COPD exacerbation without an obvious pneumonia. Has diffuse wheezing which has opened up after an hour long continuous albuterol treatment but still dyspneic, tachypneic and has diffuse wheezing. Given steroids, will need admission for respiratory support and continued treatments.     Audree Camel, MD 05/28/14 2322

## 2014-05-28 NOTE — ED Notes (Signed)
Per EMS pt comes from home with SOB for about an hour. Pt used inhaler with no relief. Pt given 10 mg of albuterol and 0.5 Atrovent. Pt states he feels a little better. Pt hx of asthma and COPD. Pt is alert and oriented.

## 2014-05-29 ENCOUNTER — Ambulatory Visit: Payer: Medicaid Other | Attending: Orthopedic Surgery | Admitting: Occupational Therapy

## 2014-05-29 DIAGNOSIS — Z87891 Personal history of nicotine dependence: Secondary | ICD-10-CM | POA: Diagnosis not present

## 2014-05-29 DIAGNOSIS — J441 Chronic obstructive pulmonary disease with (acute) exacerbation: Secondary | ICD-10-CM | POA: Diagnosis present

## 2014-05-29 DIAGNOSIS — I129 Hypertensive chronic kidney disease with stage 1 through stage 4 chronic kidney disease, or unspecified chronic kidney disease: Secondary | ICD-10-CM | POA: Diagnosis present

## 2014-05-29 DIAGNOSIS — Z7951 Long term (current) use of inhaled steroids: Secondary | ICD-10-CM | POA: Diagnosis not present

## 2014-05-29 DIAGNOSIS — H5412 Blindness, left eye, low vision right eye: Secondary | ICD-10-CM | POA: Diagnosis present

## 2014-05-29 DIAGNOSIS — G894 Chronic pain syndrome: Secondary | ICD-10-CM | POA: Diagnosis present

## 2014-05-29 DIAGNOSIS — J320 Chronic maxillary sinusitis: Secondary | ICD-10-CM | POA: Diagnosis present

## 2014-05-29 DIAGNOSIS — M79641 Pain in right hand: Secondary | ICD-10-CM | POA: Insufficient documentation

## 2014-05-29 DIAGNOSIS — J9621 Acute and chronic respiratory failure with hypoxia: Secondary | ICD-10-CM | POA: Diagnosis present

## 2014-05-29 DIAGNOSIS — R0602 Shortness of breath: Secondary | ICD-10-CM | POA: Diagnosis present

## 2014-05-29 DIAGNOSIS — K219 Gastro-esophageal reflux disease without esophagitis: Secondary | ICD-10-CM | POA: Diagnosis present

## 2014-05-29 DIAGNOSIS — J4551 Severe persistent asthma with (acute) exacerbation: Secondary | ICD-10-CM | POA: Diagnosis present

## 2014-05-29 DIAGNOSIS — Z79899 Other long term (current) drug therapy: Secondary | ICD-10-CM | POA: Diagnosis not present

## 2014-05-29 DIAGNOSIS — R739 Hyperglycemia, unspecified: Secondary | ICD-10-CM | POA: Diagnosis present

## 2014-05-29 DIAGNOSIS — E876 Hypokalemia: Secondary | ICD-10-CM | POA: Diagnosis present

## 2014-05-29 DIAGNOSIS — T380X5A Adverse effect of glucocorticoids and synthetic analogues, initial encounter: Secondary | ICD-10-CM | POA: Diagnosis present

## 2014-05-29 DIAGNOSIS — Z7289 Other problems related to lifestyle: Secondary | ICD-10-CM | POA: Diagnosis not present

## 2014-05-29 DIAGNOSIS — M25631 Stiffness of right wrist, not elsewhere classified: Secondary | ICD-10-CM | POA: Insufficient documentation

## 2014-05-29 DIAGNOSIS — R269 Unspecified abnormalities of gait and mobility: Secondary | ICD-10-CM | POA: Diagnosis present

## 2014-05-29 DIAGNOSIS — N182 Chronic kidney disease, stage 2 (mild): Secondary | ICD-10-CM | POA: Diagnosis present

## 2014-05-29 DIAGNOSIS — F319 Bipolar disorder, unspecified: Secondary | ICD-10-CM | POA: Diagnosis present

## 2014-05-29 DIAGNOSIS — E440 Moderate protein-calorie malnutrition: Secondary | ICD-10-CM | POA: Diagnosis present

## 2014-05-29 DIAGNOSIS — M25641 Stiffness of right hand, not elsewhere classified: Secondary | ICD-10-CM | POA: Insufficient documentation

## 2014-05-29 DIAGNOSIS — Z7952 Long term (current) use of systemic steroids: Secondary | ICD-10-CM | POA: Diagnosis not present

## 2014-05-29 LAB — BASIC METABOLIC PANEL
ANION GAP: 11 (ref 5–15)
BUN: 20 mg/dL (ref 6–23)
CALCIUM: 8.8 mg/dL (ref 8.4–10.5)
CO2: 21 mmol/L (ref 19–32)
Chloride: 108 mEq/L (ref 96–112)
Creatinine, Ser: 1.17 mg/dL (ref 0.50–1.35)
GFR calc Af Amer: 75 mL/min — ABNORMAL LOW (ref >90)
GFR calc non Af Amer: 65 mL/min — ABNORMAL LOW (ref >90)
GLUCOSE: 182 mg/dL — AB (ref 70–99)
Potassium: 3.7 mmol/L (ref 3.5–5.1)
Sodium: 140 mmol/L (ref 135–145)

## 2014-05-29 LAB — GLUCOSE, CAPILLARY
GLUCOSE-CAPILLARY: 141 mg/dL — AB (ref 70–99)
Glucose-Capillary: 186 mg/dL — ABNORMAL HIGH (ref 70–99)
Glucose-Capillary: 151 mg/dL — ABNORMAL HIGH (ref 70–99)

## 2014-05-29 MED ORDER — TRIAMCINOLONE ACETONIDE 0.1 % EX CREA
1.0000 "application " | TOPICAL_CREAM | Freq: Two times a day (BID) | CUTANEOUS | Status: DC
  Administered 2014-05-29 – 2014-06-05 (×6): 1 via TOPICAL
  Filled 2014-05-29: qty 15

## 2014-05-29 MED ORDER — PNEUMOCOCCAL VAC POLYVALENT 25 MCG/0.5ML IJ INJ
0.5000 mL | INJECTION | INTRAMUSCULAR | Status: DC
  Filled 2014-05-29 (×2): qty 0.5

## 2014-05-29 MED ORDER — MOMETASONE FURO-FORMOTEROL FUM 200-5 MCG/ACT IN AERO
2.0000 | INHALATION_SPRAY | Freq: Two times a day (BID) | RESPIRATORY_TRACT | Status: DC
  Administered 2014-05-29 – 2014-06-05 (×16): 2 via RESPIRATORY_TRACT
  Filled 2014-05-29: qty 8.8

## 2014-05-29 MED ORDER — IPRATROPIUM-ALBUTEROL 0.5-2.5 (3) MG/3ML IN SOLN
3.0000 mL | Freq: Four times a day (QID) | RESPIRATORY_TRACT | Status: DC
  Administered 2014-05-29 – 2014-06-06 (×30): 3 mL via RESPIRATORY_TRACT
  Filled 2014-05-29 (×31): qty 3

## 2014-05-29 MED ORDER — MONTELUKAST SODIUM 10 MG PO TABS
10.0000 mg | ORAL_TABLET | Freq: Every day | ORAL | Status: DC
  Administered 2014-05-29 – 2014-06-05 (×8): 10 mg via ORAL
  Filled 2014-05-29 (×9): qty 1

## 2014-05-29 MED ORDER — DIAZEPAM 5 MG PO TABS
10.0000 mg | ORAL_TABLET | Freq: Three times a day (TID) | ORAL | Status: DC
  Administered 2014-05-29 – 2014-06-02 (×12): 10 mg via ORAL
  Filled 2014-05-29 (×14): qty 2

## 2014-05-29 MED ORDER — SERTRALINE HCL 100 MG PO TABS
200.0000 mg | ORAL_TABLET | Freq: Every day | ORAL | Status: DC
  Administered 2014-05-29 – 2014-06-06 (×9): 200 mg via ORAL
  Filled 2014-05-29 (×9): qty 2

## 2014-05-29 MED ORDER — PREDNISOLONE ACETATE 1 % OP SUSP
1.0000 [drp] | Freq: Every day | OPHTHALMIC | Status: DC
  Administered 2014-05-29 – 2014-06-06 (×8): 1 [drp] via OPHTHALMIC
  Filled 2014-05-29 (×3): qty 1

## 2014-05-29 MED ORDER — ENSURE COMPLETE PO LIQD
237.0000 mL | Freq: Three times a day (TID) | ORAL | Status: DC
  Administered 2014-05-29 – 2014-06-06 (×24): 237 mL via ORAL

## 2014-05-29 MED ORDER — HYDRALAZINE HCL 20 MG/ML IJ SOLN
10.0000 mg | Freq: Once | INTRAMUSCULAR | Status: AC
  Administered 2014-05-29: 10 mg via INTRAVENOUS
  Filled 2014-05-29: qty 1

## 2014-05-29 MED ORDER — OXYCODONE HCL 5 MG PO TABS
5.0000 mg | ORAL_TABLET | ORAL | Status: DC | PRN
  Administered 2014-06-01: 5 mg via ORAL
  Filled 2014-05-29 (×5): qty 1

## 2014-05-29 MED ORDER — OXYCODONE HCL 5 MG PO TABS
10.0000 mg | ORAL_TABLET | Freq: Four times a day (QID) | ORAL | Status: DC | PRN
  Administered 2014-05-29 – 2014-06-02 (×10): 10 mg via ORAL
  Filled 2014-05-29 (×10): qty 2

## 2014-05-29 MED ORDER — PANTOPRAZOLE SODIUM 40 MG PO TBEC
40.0000 mg | DELAYED_RELEASE_TABLET | Freq: Every day | ORAL | Status: DC
  Administered 2014-05-29 – 2014-06-06 (×9): 40 mg via ORAL
  Filled 2014-05-29 (×9): qty 1

## 2014-05-29 MED ORDER — GI COCKTAIL ~~LOC~~
30.0000 mL | Freq: Once | ORAL | Status: AC
  Administered 2014-05-29: 30 mL via ORAL
  Filled 2014-05-29: qty 30

## 2014-05-29 MED ORDER — ALPRAZOLAM 0.5 MG PO TABS
0.5000 mg | ORAL_TABLET | Freq: Once | ORAL | Status: AC
Start: 2014-05-29 — End: 2014-05-29
  Administered 2014-05-29: 0.5 mg via ORAL
  Filled 2014-05-29: qty 1

## 2014-05-29 MED ORDER — IBUPROFEN 200 MG PO TABS
600.0000 mg | ORAL_TABLET | Freq: Four times a day (QID) | ORAL | Status: AC | PRN
  Administered 2014-05-29 (×2): 600 mg via ORAL
  Filled 2014-05-29 (×2): qty 3

## 2014-05-29 MED ORDER — FLUTICASONE PROPIONATE 50 MCG/ACT NA SUSP
2.0000 | Freq: Every day | NASAL | Status: DC
  Administered 2014-05-29 – 2014-06-03 (×6): 2 via NASAL
  Filled 2014-05-29: qty 16

## 2014-05-29 MED ORDER — METHYLPREDNISOLONE SODIUM SUCC 125 MG IJ SOLR
60.0000 mg | Freq: Four times a day (QID) | INTRAMUSCULAR | Status: DC
  Administered 2014-05-29 – 2014-06-01 (×11): 60 mg via INTRAVENOUS
  Filled 2014-05-29 (×15): qty 0.96
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
## 2014-05-29 NOTE — Progress Notes (Signed)
UR completed 
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
## 2014-05-29 NOTE — Progress Notes (Signed)
Inpatient Diabetes Program Recommendations  AACE/ADA: New Consensus Statement on Inpatient Glycemic Control (2013)  Target Ranges:  Prepandial:   less than 140 mg/dL      Peak postprandial:   less than 180 mg/dL (1-2 hours)      Critically ill patients:  140 - 180 mg/dL   Reason for Assessment:  Results for [name] [name], [name] [name] (MRN [id number]) as of 05/29/2014 11:16  Ref. Range 05/29/2014 05:15  Glucose Latest Range: 70-99 mg/dL 742182 (H)   No history of diabetes, however patient is on IV steroids.  Consider checking CBG's tid and HS.   If greater than 150 mg/dL, may consider adding Novolog moderate correction.    Thanks, Beryl MeagerJenny Denaisha Swango, RN, BC-ADM Inpatient Diabetes Coordinator Pager [phone]

## 2014-05-30 DIAGNOSIS — E440 Moderate protein-calorie malnutrition: Secondary | ICD-10-CM | POA: Diagnosis present

## 2014-05-30 LAB — GLUCOSE, CAPILLARY
GLUCOSE-CAPILLARY: 124 mg/dL — AB (ref 70–99)
Glucose-Capillary: 135 mg/dL — ABNORMAL HIGH (ref 70–99)
Glucose-Capillary: 167 mg/dL — ABNORMAL HIGH (ref 70–99)
Glucose-Capillary: 219 mg/dL — ABNORMAL HIGH (ref 70–99)
Glucose-Capillary: 181 mg/dL — ABNORMAL HIGH (ref 70–99)
Glucose-Capillary: 135 mg/dL — ABNORMAL HIGH (ref 70–99)

## 2014-05-30 LAB — TROPONIN I
Troponin I: <0.03 ng/mL (ref <0.031)
Troponin I: <0.03 ng/mL (ref <0.031)

## 2014-05-30 MED ORDER — IBUPROFEN 800 MG PO TABS
800.0000 mg | ORAL_TABLET | Freq: Four times a day (QID) | ORAL | Status: DC | PRN
  Administered 2014-05-30 – 2014-06-04 (×3): 800 mg via ORAL
  Filled 2014-05-30 (×4): qty 1
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
## 2014-05-30 NOTE — Progress Notes (Signed)
Event:   Notified at approx 2215 that pt c/o CP. 12-Lead EKG obtained and revealed NSR w/ rate of 95. RR RN was paged and responded to bedside. Was unable to get to bedside right away so based on report from RR RN Concepcion Living(H. Johnson, RN) ordered a GI cocktail and Hydralazine 10 mg IV for BP of 192/110. Upon my arrival to bedside pt noted resting w/ eyes closed. He awakens easily and is able to answer questions. He admits his "CP" is much improved since GI cocktail. Admits to h/o reflux. MD note lists hiatal hernia and ssophageal reflux in past medical history.  When ask pt reports he has had this pain for 3 days and points to his epigastric/upper abd region. He does have mild TTP over epigastrium. Abd is soft w/ normal BS in all quads. BBS diminished w/ intermittent expiratory wheezes. Respiratory effort is WNL w/ frequent cough. 02 sats 97% on r/a. T-97.9, P-80-90's, R-20. F/u BP is 161/89 after hydralazine. Assessment/Plan: 1. Chest pain:  MD note reveals similar c/o's this hospitalization. EKG w/o acute changes. Given relief w/ GI Cocktail likely GI in nature. Will obtain troponin now and one in am. 2. HTN: Pt w/ known h/o HTN but does not appear to be on antihypertensives.  Much improved w/ IV Hydralazine. Will continue to monitor BP closely. IV Hydralazine PRN. Discussed pt w/ Dr Allena KatzPatel who has also reviewed EKG and is in agreement w/ plan.  Leanne ChangKatherine P. Kien Mirsky, NP-C [facility] Pager [phone]
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
## 2014-05-30 NOTE — Care Management Note (Addendum)
    Page 1 of 2   06/06/2014     12:19:32 PM CARE MANAGEMENT NOTE 06/06/2014  Patient:  [name] [name],[name] [name]   Account Number:  [id number]  Date Initiated:  05/30/2014  Documentation initiated by:  Lorenda IshiharaPEELE,Cyana Shook  Subjective/Objective Assessment:   64 yo male admitted with copd exacerbation. PTA lived at home alone. Has aide for 2h/day 2 days per week.     Action/Plan:   Home when stable, uses [employer] for transportation   Anticipated DC Date:  06/02/2014   Anticipated DC Plan:  HOME/SELF CARE  In-house referral  Clinical Social Worker      DC Planning Services  CM consult  Medication Assistance      Choice offered to / List presented to:          [facility]  HH[facility] RN  HH-9 CAPS PROGRAM  HH-6 SOCIAL WORKER      HH agency  [facility]  OTHER - SEE NOTE   Status of service:  Completed, signed off Medicare Important Message given?   (If response is "NO", the following Medicare IM given date fields will be blank) Date Medicare IM given:   Medicare IM given by:   Date Additional Medicare IM given:   Additional Medicare IM given by:    Discharge Disposition:  HOME W HOME HEALTH SERVICES  Per UR Regulation:  Reviewed for med. necessity/level of care/duration of stay  If discussed at Long Length of Stay Meetings, dates discussed:    Comments:  06-06-14 Lorenda IshiharaSuzanne Ambika Zettlemoyer RN CM 1000 Spoke with patient at bedside, dicussed [name]/c today. [name], [facility]. [name], has no preference for agency. Patient had issues with the cost one of his medication but he is covered by Medicaid so I am unable to assist with medication purchases. Patient understands and has some at his home. Patient requested transportation assistance, contacted CSW.  05-29-13 Lorenda IshiharaSuzanne Tryston Gilliam RN CM 1400 Spoke with patient at length regarding home situation. He has no interest in going to SNF, he states he manages well at home with his aid, he has  a computer with technology for sight impaired, uses [employer] for transportation. He has access to medication and MD visits.

## 2014-05-31 ENCOUNTER — Inpatient Hospital Stay (HOSPITAL_COMMUNITY): Payer: Medicaid Other

## 2014-05-31 ENCOUNTER — Encounter (HOSPITAL_COMMUNITY): Payer: Self-pay | Admitting: Radiology

## 2014-05-31 DIAGNOSIS — J9620 Acute and chronic respiratory failure, unspecified whether with hypoxia or hypercapnia: Secondary | ICD-10-CM | POA: Diagnosis present

## 2014-05-31 DIAGNOSIS — R05 Cough: Secondary | ICD-10-CM

## 2014-05-31 DIAGNOSIS — R053 Chronic cough: Secondary | ICD-10-CM | POA: Diagnosis present

## 2014-05-31 DIAGNOSIS — J9621 Acute and chronic respiratory failure with hypoxia: Secondary | ICD-10-CM

## 2014-05-31 DIAGNOSIS — J4531 Mild persistent asthma with (acute) exacerbation: Secondary | ICD-10-CM

## 2014-05-31 DIAGNOSIS — J45901 Unspecified asthma with (acute) exacerbation: Secondary | ICD-10-CM | POA: Diagnosis present

## 2014-05-31 DIAGNOSIS — G894 Chronic pain syndrome: Secondary | ICD-10-CM

## 2014-05-31 LAB — PULMONARY FUNCTION TEST
FEF 25-75 Pre: 0.65 L/sec
FEF2575-%Pred-Pre: 22 %
FEV1-%Pred-Pre: 38 %
FEV1-PRE: 1.41 L
FEV1FVC-%PRED-PRE: 79 %
FEV6-%PRED-PRE: 47 %
FEV6-Pre: 2.2 L
FEV6FVC-%Pred-Pre: 98 %
FVC-%Pred-Pre: 48 %
FVC-Pre: 2.36 L
Pre FEV1/FVC ratio: 60 %
Pre FEV6/FVC Ratio: 93 %

## 2014-05-31 LAB — GLUCOSE, CAPILLARY
GLUCOSE-CAPILLARY: 122 mg/dL — AB (ref 70–99)
GLUCOSE-CAPILLARY: 133 mg/dL — AB (ref 70–99)
Glucose-Capillary: 120 mg/dL — ABNORMAL HIGH (ref 70–99)
Glucose-Capillary: 149 mg/dL — ABNORMAL HIGH (ref 70–99)
Glucose-Capillary: 166 mg/dL — ABNORMAL HIGH (ref 70–99)
Glucose-Capillary: 179 mg/dL — ABNORMAL HIGH (ref 70–99)
Glucose-Capillary: 233 mg/dL — ABNORMAL HIGH (ref 70–99)

## 2014-05-31 MED ORDER — MORPHINE SULFATE 2 MG/ML IJ SOLN
2.0000 mg | Freq: Once | INTRAMUSCULAR | Status: AC
  Administered 2014-05-31: 2 mg via INTRAVENOUS
  Filled 2014-05-31: qty 1

## 2014-05-31 MED ORDER — MORPHINE SULFATE 2 MG/ML IJ SOLN
INTRAMUSCULAR | Status: AC
  Filled 2014-05-31: qty 1

## 2014-05-31 MED ORDER — IOHEXOL 300 MG/ML  SOLN
100.0000 mL | Freq: Once | INTRAMUSCULAR | Status: AC | PRN
  Administered 2014-05-31: 100 mL via INTRAVENOUS

## 2014-05-31 MED ORDER — MORPHINE SULFATE 2 MG/ML IJ SOLN
1.0000 mg | INTRAMUSCULAR | Status: DC | PRN
  Administered 2014-05-31 – 2014-06-04 (×13): 2 mg via INTRAVENOUS
  Filled 2014-05-31 (×12): qty 1

## 2014-05-31 MED ORDER — ACETAMINOPHEN 325 MG PO TABS
650.0000 mg | ORAL_TABLET | Freq: Four times a day (QID) | ORAL | Status: DC | PRN
  Administered 2014-06-02: 650 mg via ORAL
  Filled 2014-05-31 (×2): qty 2

## 2014-05-31 NOTE — Plan of Care (Signed)
Problem: Phase I Progression Outcomes Goal: Pain controlled Outcome: Not Progressing Pt had multiple issues with pain management throughout the day. See progress notes.

## 2014-05-31 NOTE — Progress Notes (Addendum)
At 1100 pt complaining of back pain and headache. Pt refused ibuprofen at this time. Contacted Dr Rito EhrlichKrishnan and an order for morphine x1 was entered. Pt went down for PFT test so could not have morphine before there test. Informed pt that I would give him the morphine when he returned to the floor after the test.  Gave pt Morphine at 1158. Pt continuing to complain of head and back pain. Pt went down for CT and was brought back to the floor because pt refused the tests. Pt stated he was in too much pain. Offered pt prn oxycodone and pt refused. Contacted Dr Rito EhrlichKrishnan again to make him aware at 1340. Went to inform patient of pain medication options and pt was asleep.

## 2014-05-31 NOTE — Progress Notes (Signed)
Attempted to perform PFT on pt in the pulmonary lab. At this time pt has severe headache & too SOB to perform test. Attempted a couple FVC, but pt unable to do accurately due to SOB & hurting his head. Discussed with Dr Marchelle Gearingamaswamy & he said to cancel the test. Pt transported back to floor & RT informed to give neb tx.  Jacqulynn CadetHopper, Nekesha Font David RRT
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
## 2014-05-31 NOTE — Progress Notes (Signed)
OT Cancellation Note  Patient Details Name: [name] [name] MRN: [id number] DOB: 1951/03/16   Cancelled Treatment:    Reason Eval/Treat Not Completed: Other (comment)  Spoke to RN: pt is getting ready to go for a test: w/c outside of room. Also, left a message for Dr Mina MarbleWeingold to clarify exercises after last hand sx.  [name] [name] 05/31/2014, 4:08 PM  [name] [name], OTR/L [phone] 05/31/2014
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
## 2014-05-31 NOTE — Progress Notes (Signed)
Pt continuing to complain of head and back pain. Offered pt PRN oxycodone, tylenol, or ibuprofen and pt refused. Offered to patient multiple times. Pt stated that those medications were not going to help at all so there was no point in taking them. Pt agitated and demanding different pain medicine. Paged Dr Rito EhrlichKrishnan. See orders.

## 2014-06-01 DIAGNOSIS — J45901 Unspecified asthma with (acute) exacerbation: Secondary | ICD-10-CM

## 2014-06-01 LAB — BASIC METABOLIC PANEL
ANION GAP: 8 (ref 5–15)
BUN: 33 mg/dL — ABNORMAL HIGH (ref 6–23)
CALCIUM: 8.9 mg/dL (ref 8.4–10.5)
CO2: 32 mmol/L (ref 19–32)
CREATININE: 0.99 mg/dL (ref 0.50–1.35)
Chloride: 101 mEq/L (ref 96–112)
GFR calc non Af Amer: 85 mL/min — ABNORMAL LOW (ref >90)
GLUCOSE: 151 mg/dL — AB (ref 70–99)
Potassium: 5.1 mmol/L (ref 3.5–5.1)
Sodium: 141 mmol/L (ref 135–145)

## 2014-06-01 LAB — GLUCOSE, CAPILLARY
GLUCOSE-CAPILLARY: 152 mg/dL — AB (ref 70–99)
Glucose-Capillary: 150 mg/dL — ABNORMAL HIGH (ref 70–99)
Glucose-Capillary: 140 mg/dL — ABNORMAL HIGH (ref 70–99)
Glucose-Capillary: 138 mg/dL — ABNORMAL HIGH (ref 70–99)
Glucose-Capillary: 133 mg/dL — ABNORMAL HIGH (ref 70–99)

## 2014-06-01 LAB — CBC
HCT: 40.9 % (ref 39.0–52.0)
HEMOGLOBIN: 13.1 g/dL (ref 13.0–17.0)
MCH: 29.6 pg (ref 26.0–34.0)
MCHC: 32 g/dL (ref 30.0–36.0)
MCV: 92.5 fL (ref 78.0–100.0)
Platelets: 169 10*3/uL (ref 150–400)
RBC: 4.42 MIL/uL (ref 4.22–5.81)
RDW: 13.5 % (ref 11.5–15.5)
WBC: 9 10*3/uL (ref 4.0–10.5)

## 2014-06-01 MED ORDER — GABAPENTIN 300 MG PO CAPS
300.0000 mg | ORAL_CAPSULE | Freq: Every day | ORAL | Status: DC
  Administered 2014-06-01 – 2014-06-05 (×4): 300 mg via ORAL
  Filled 2014-06-01 (×6): qty 1

## 2014-06-01 MED ORDER — PREDNISONE 20 MG PO TABS
40.0000 mg | ORAL_TABLET | Freq: Every day | ORAL | Status: DC
  Administered 2014-06-01 – 2014-06-03 (×3): 40 mg via ORAL
  Filled 2014-06-01 (×4): qty 2

## 2014-06-01 MED ORDER — PREDNISONE 20 MG PO TABS
40.0000 mg | ORAL_TABLET | Freq: Every day | ORAL | Status: DC
  Filled 2014-06-01: qty 2

## 2014-06-01 MED ORDER — GUAIFENESIN 100 MG/5ML PO SOLN
200.0000 mg | ORAL | Status: DC | PRN
  Administered 2014-06-01 – 2014-06-06 (×6): 200 mg via ORAL
  Filled 2014-06-01 (×5): qty 10
  Filled 2014-06-01: qty 20
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
## 2014-06-01 NOTE — Progress Notes (Signed)
PT Cancellation Note  Patient Details Name: [name] PattenRoger D Gailey MRN: [id number] DOB: [birth year]   Cancelled Treatment:    Reason Eval/Treat Not Completed: Fatigue/lethargy limiting ability to participate (pt stated he's trying to sleep right now, will follow).   Ralene BatheUhlenberg, Levorn Oleski Kistler 06/01/2014, 12:58 PM [phone]
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
## 2014-06-02 LAB — GLUCOSE, CAPILLARY
GLUCOSE-CAPILLARY: 171 mg/dL — AB (ref 70–99)
GLUCOSE-CAPILLARY: 169 mg/dL — AB (ref 70–99)
GLUCOSE-CAPILLARY: 177 mg/dL — AB (ref 70–99)
Glucose-Capillary: 127 mg/dL — ABNORMAL HIGH (ref 70–99)
Glucose-Capillary: 287 mg/dL — ABNORMAL HIGH (ref 70–99)
Glucose-Capillary: 134 mg/dL — ABNORMAL HIGH (ref 70–99)
Glucose-Capillary: 183 mg/dL — ABNORMAL HIGH (ref 70–99)

## 2014-06-02 LAB — BASIC METABOLIC PANEL
Anion gap: 6 (ref 5–15)
BUN: 31 mg/dL — ABNORMAL HIGH (ref 6–23)
CALCIUM: 8.2 mg/dL — AB (ref 8.4–10.5)
CO2: 30 mmol/L (ref 19–32)
Chloride: 99 mEq/L (ref 96–112)
Creatinine, Ser: 0.9 mg/dL (ref 0.50–1.35)
GFR calc Af Amer: >90 mL/min (ref >90)
GFR calc non Af Amer: 89 mL/min — ABNORMAL LOW (ref >90)
Glucose, Bld: 147 mg/dL — ABNORMAL HIGH (ref 70–99)
Potassium: 4.3 mmol/L (ref 3.5–5.1)
SODIUM: 135 mmol/L (ref 135–145)

## 2014-06-02 MED ORDER — DIAZEPAM 5 MG PO TABS
10.0000 mg | ORAL_TABLET | Freq: Three times a day (TID) | ORAL | Status: DC | PRN
  Administered 2014-06-02: 20 mg via ORAL
  Administered 2014-06-03 (×2): 10 mg via ORAL
  Administered 2014-06-04: 20 mg via ORAL
  Filled 2014-06-02 (×2): qty 2
  Filled 2014-06-02 (×3): qty 4

## 2014-06-02 MED ORDER — OXYCODONE HCL 5 MG PO TABS
5.0000 mg | ORAL_TABLET | Freq: Once | ORAL | Status: AC
  Administered 2014-06-02: 5 mg via ORAL

## 2014-06-02 MED ORDER — OXYCODONE HCL 5 MG PO TABS
10.0000 mg | ORAL_TABLET | ORAL | Status: DC | PRN
  Administered 2014-06-02 – 2014-06-06 (×13): 10 mg via ORAL
  Filled 2014-06-02 (×13): qty 2

## 2014-06-02 NOTE — Progress Notes (Signed)
Pt's CBG at 0430 was 287 because he had insisted on drinking 2 Ensure and then grape juice. Notified provider on call, she suggested I recheck it in about an hour. It was then 137. Notified provider of results.

## 2014-06-02 NOTE — Progress Notes (Signed)
PT Cancellation Note  Patient Details Name: [name] [name] MRN: [id number] DOB: 12-11-1950   Cancelled Treatment:    Reason Eval/Treat Not Completed: Other (comment) (pt refused stating he just got comfortable and he didn't want to walk and get SOB again); will check on pt again next week.   [facility] PaWILLIAMS,Hillel Card 06/02/2014, 11:31 AM
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
## 2014-06-02 NOTE — Progress Notes (Signed)
After 4 hrs pt requested 10 mg more of oxycodone. I reported to him that the order was for 10 mg oxycodone was every 6 hrs. I told him I could give him 5 mg of oxycodone because that was ordered every 4 hrs. He refused to accept only 5 mg, and demanded to talk to a MD about his pain meds. I called the overnight house provider she advised me that she could not change the original order, but she would be willing to give the pt a one 10 mg dose of oxycodone. I explained this to the pt, and he agreed to take the 10 mg, but was complaining that he did not like the way his pain was being managed. I relayed the message from the overnight provider that he should discuss his pain mgmt with his own MD in the morning.

## 2014-06-03 ENCOUNTER — Inpatient Hospital Stay (HOSPITAL_COMMUNITY): Payer: Medicaid Other

## 2014-06-03 DIAGNOSIS — J0100 Acute maxillary sinusitis, unspecified: Secondary | ICD-10-CM | POA: Diagnosis present

## 2014-06-03 DIAGNOSIS — J45909 Unspecified asthma, uncomplicated: Secondary | ICD-10-CM | POA: Diagnosis present

## 2014-06-03 DIAGNOSIS — J45998 Other asthma: Secondary | ICD-10-CM

## 2014-06-03 LAB — GLUCOSE, CAPILLARY
GLUCOSE-CAPILLARY: 148 mg/dL — AB (ref 70–99)
GLUCOSE-CAPILLARY: 159 mg/dL — AB (ref 70–99)
GLUCOSE-CAPILLARY: 199 mg/dL — AB (ref 70–99)
Glucose-Capillary: 152 mg/dL — ABNORMAL HIGH (ref 70–99)
Glucose-Capillary: 117 mg/dL — ABNORMAL HIGH (ref 70–99)
Glucose-Capillary: 141 mg/dL — ABNORMAL HIGH (ref 70–99)
Glucose-Capillary: 195 mg/dL — ABNORMAL HIGH (ref 70–99)

## 2014-06-03 MED ORDER — METHYLPREDNISOLONE SODIUM SUCC 125 MG IJ SOLR
80.0000 mg | Freq: Two times a day (BID) | INTRAMUSCULAR | Status: DC
  Administered 2014-06-03 (×2): 80 mg via INTRAVENOUS
  Filled 2014-06-03 (×4): qty 1.28

## 2014-06-03 MED ORDER — SALINE SPRAY 0.65 % NA SOLN
3.0000 | Freq: Three times a day (TID) | NASAL | Status: DC
Start: 2014-06-03 — End: 2014-06-06
  Administered 2014-06-03 – 2014-06-04 (×3): 3 via NASAL
  Filled 2014-06-03: qty 44

## 2014-06-03 MED ORDER — DEXTROMETHORPHAN POLISTIREX 30 MG/5ML PO LQCR
60.0000 mg | Freq: Two times a day (BID) | ORAL | Status: DC
  Administered 2014-06-03 – 2014-06-06 (×7): 60 mg via ORAL
  Filled 2014-06-03 (×8): qty 10

## 2014-06-03 MED ORDER — FLUTICASONE PROPIONATE 50 MCG/ACT NA SUSP
2.0000 | Freq: Two times a day (BID) | NASAL | Status: DC
  Administered 2014-06-03 – 2014-06-06 (×6): 2 via NASAL
  Filled 2014-06-03: qty 16

## 2014-06-03 MED ORDER — AZITHROMYCIN 500 MG PO TABS
500.0000 mg | ORAL_TABLET | Freq: Every day | ORAL | Status: DC
Start: 2014-06-03 — End: 2014-06-06
  Administered 2014-06-03 – 2014-06-06 (×4): 500 mg via ORAL
  Filled 2014-06-03 (×4): qty 1
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
## 2014-06-03 NOTE — Progress Notes (Addendum)
PULMONARY / CRITICAL CARE MEDICINE   Name: [name] PattenRoger D Tawney MRN: [id number] DOB: [birth year]    ADMISSION DATE:  05/28/2014 CONSULTATION DATE:  05/31/2014   REFERRING MD :  Dr Pleas KochJai Samtani and Osvaldo ShipperGokul KRishnan of [facility]  CHIEF COMPLAINT:  Severe acute asthma   SIGNIFICANT EVENTS: 05/28/2014 - admit 05/31/14 - PCCM consult   SUBJECTIVE/OVERNIGHT/INTERVAL HX Pt with ongoing cough,  Has bronchiolitis on CT chest and poss chronic MAI , pt remains hypoxic, PCCM had s/o on 1/14.  [facility] asked for PCCM to get more involved and pt making slow progress.  VITAL SIGNS: Temp:  [97.7 F (36.5 C)-98.2 F (36.8 C)] 98.1 F (36.7 C) (01/16 1211) Pulse Rate:  [82-133] 133 (01/16 1221) Resp:  [18-20] 18 (01/16 1211) BP: (122-158)/(73-96) 132/83 mmHg (01/16 1211) SpO2:  [89 %-98 %] 89 % (01/16 1221)    INTAKE / OUTPUT:  Intake/Output Summary (Last 24 hours) at 06/03/14 1239 Last data filed at 06/03/14 0900  Gross per 24 hour  Intake    720 ml  Output    650 ml  Net     70 ml    PHYSICAL EXAMINATION: General:  Deconditioned male.  Neuro:  AXOX3. GCS 15. CAM-ICU neg for delirium. Moves all 4s HEENT:  Blind left eye Edentulous. Mallampatti clas 3 Cardiovascular:  S1S2+. No murmur Lungs:  Barrell chest, exp pseudowheeze Abdomen:  Soft, non tendern Musculoskeletal:  No cyanosis, No clubbing. No edema Skin:  Intact anteriorly  LABS:  PULMONARY No results for input(s): PHART, PCO2ART, PO2ART, HCO3, TCO2, O2SAT in the last 168 hours.  Invalid input(s): PCO2, PO2  CBC  Recent Labs Lab 05/28/14 2027 06/01/14 0518  HGB 13.8 13.1  HCT 41.2 40.9  WBC 8.8 9.0  PLT 174 169    COAGULATION No results for input(s): INR in the last 168 hours.  CARDIAC    Recent Labs Lab 05/30/14 0015 05/30/14 0650  TROPONINI <0.03 <0.03   No results for input(s): PROBNP in the last 168 hours.   CHEMISTRY  Recent Labs Lab 05/28/14 2027 05/29/14 0515 06/01/14 0518 06/02/14 0513  NA 140 140  141 135  K 2.7* 3.7 5.1 4.3  CL 111 108 101 99  CO2 25 21 32 30  GLUCOSE 111* 182* 151* 147*  BUN 21 20 33* 31*  CREATININE 0.99 1.17 0.99 0.90  CALCIUM 8.7 8.8 8.9 8.2*   Estimated Creatinine Clearance: 77.6 mL/min (by C-G formula based on Cr of 0.9).   LIVER No results for input(s): AST, ALT, ALKPHOS, BILITOT, PROT, ALBUMIN, INR in the last 168 hours.   INFECTIOUS No results for input(s): LATICACIDVEN, PROCALCITON in the last 168 hours.   ENDOCRINE CBG (last 3)   Recent Labs  06/03/14 0413 06/03/14 0821 06/03/14 1227  GLUCAP 152* 117* 141*    IMAGING x48h No results found.  CT chest reviewed    ASSESSMENT / PLAN:  PULMONARY  A: Baseline severe persistent asthma - on chronic prednisone Still hypoxic, has infiltrates on CT ? Atypical PNA, ? MAI  Currently Acute Respiratory Failure with slow resolution on 06/03/14. Admitted as AE-asthma.   CT shows likely viral bronchilotis. Course complicated by  severe chronic upper airway cough that is likely impacting resoltions   - still with cough Viral screen never performed  P:   chk viral screen Change pred to 80mg  iv bid solumedrol  Delsym bid Flutter  Add oxygen 2L chck ct sinus Will follow Keep 1/28 OV appt as below   Future Appointments  Date Time Provider Department Center  06/15/2014 9:00 AM Julio Sicks, NP [facility] None   Caryl Bis  [phone]  Cell  [phone]  If no response or cell goes to voicemail, call beeper [phone]  06/03/2014 12:39 PM

## 2014-06-03 NOTE — Progress Notes (Signed)
Patient c/o not getting his morphine IV medication as he wanted.  He took oxycodone and slept most of the night.  Where He woke up he c/o of severe pain in his back he was given another dose of 10 mg of oxycodone, but he wanted morphine IV instead.  He said his pain will not go away if he did not get morphine IV.  He complained a lot about meds being changed. He had oxycodone 10 mg at 6 am and then again given  morphine 2 mg  Iv at seven am.  Charise CarwinSophia Hayford, RN.

## 2014-06-04 DIAGNOSIS — J0100 Acute maxillary sinusitis, unspecified: Secondary | ICD-10-CM

## 2014-06-04 LAB — GLUCOSE, CAPILLARY
GLUCOSE-CAPILLARY: 137 mg/dL — AB (ref 70–99)
GLUCOSE-CAPILLARY: 320 mg/dL — AB (ref 70–99)
GLUCOSE-CAPILLARY: 192 mg/dL — AB (ref 70–99)
Glucose-Capillary: 124 mg/dL — ABNORMAL HIGH (ref 70–99)
Glucose-Capillary: 137 mg/dL — ABNORMAL HIGH (ref 70–99)

## 2014-06-04 MED ORDER — PREDNISONE 50 MG PO TABS
60.0000 mg | ORAL_TABLET | Freq: Every day | ORAL | Status: DC
  Administered 2014-06-04 – 2014-06-06 (×3): 60 mg via ORAL
  Filled 2014-06-04 (×5): qty 1

## 2014-06-04 MED ORDER — HYDROMORPHONE HCL 1 MG/ML IJ SOLN
0.5000 mg | INTRAMUSCULAR | Status: DC | PRN
  Administered 2014-06-04: 1 mg via INTRAVENOUS
  Administered 2014-06-04: 0.5 mg via INTRAVENOUS
  Administered 2014-06-04 – 2014-06-06 (×7): 1 mg via INTRAVENOUS
  Filled 2014-06-04 (×9): qty 1

## 2014-06-04 MED ORDER — BENZONATATE 100 MG PO CAPS
100.0000 mg | ORAL_CAPSULE | Freq: Once | ORAL | Status: AC
  Administered 2014-06-04: 100 mg via ORAL
  Filled 2014-06-04: qty 1

## 2014-06-04 MED ORDER — HYDROCOD POLST-CHLORPHEN POLST 10-8 MG/5ML PO LQCR
5.0000 mL | Freq: Two times a day (BID) | ORAL | Status: DC | PRN
  Administered 2014-06-04 – 2014-06-06 (×4): 5 mL via ORAL
  Filled 2014-06-04 (×4): qty 5

## 2014-06-04 MED ORDER — BENZONATATE 100 MG PO CAPS
100.0000 mg | ORAL_CAPSULE | Freq: Three times a day (TID) | ORAL | Status: DC
  Administered 2014-06-04 – 2014-06-06 (×5): 100 mg via ORAL
  Filled 2014-06-04 (×10): qty 1

## 2014-06-04 NOTE — Progress Notes (Signed)
PULMONARY / CRITICAL CARE MEDICINE   Name: [name] PattenRoger D [name] MRN: [id number] DOB: 07/02/1950    ADMISSION DATE:  05/28/2014 CONSULTATION DATE:  05/31/2014   REFERRING MD :  Dr Pleas KochJai Samtani and Osvaldo ShipperGokul KRishnan of [facility]  CHIEF COMPLAINT:  Severe acute asthma   SIGNIFICANT EVENTS: 05/28/2014 - admit 05/31/14 - PCCM consult   SUBJECTIVE/OVERNIGHT/INTERVAL HX Pt with ongoing cough,  Has bronchiolitis on CT chest and poss chronic MAI , pt remains hypoxic, PCCM had s/o on 1/14.  [facility] asked for PCCM to get more involved and pt making slow progress.  VITAL SIGNS: Temp:  [97.7 F (36.5 C)-98.7 F (37.1 C)] 98 F (36.7 C) (01/17 0559) Pulse Rate:  [89-133] 89 (01/17 0559) Resp:  [18-20] 18 (01/17 0559) BP: (122-147)/(76-89) 147/89 mmHg (01/17 0559) SpO2:  [89 %-98 %] 95 % (01/17 0559)    INTAKE / OUTPUT:  Intake/Output Summary (Last 24 hours) at 06/04/14 0839 Last data filed at 06/04/14 0540  Gross per 24 hour  Intake   2480 ml  Output   2701 ml  Net   -221 ml    PHYSICAL EXAMINATION: General:  Deconditioned male.  Neuro:  AXOX3. GCS 15. CAM-ICU neg for delirium. Moves all 4s HEENT:  Blind left eye Edentulous. Mallampatti clas 3 Cardiovascular:  S1S2+. No murmur Lungs:  Barrell chest, exp pseudowheeze Abdomen:  Soft, non tendern Musculoskeletal:  No cyanosis, No clubbing. No edema Skin:  Intact anteriorly  LABS:  PULMONARY No results for input(s): PHART, PCO2ART, PO2ART, HCO3, TCO2, O2SAT in the last 168 hours.  Invalid input(s): PCO2, PO2  CBC  Recent Labs Lab 05/28/14 2027 06/01/14 0518  HGB 13.8 13.1  HCT 41.2 40.9  WBC 8.8 9.0  PLT 174 169    COAGULATION No results for input(s): INR in the last 168 hours.  CARDIAC    Recent Labs Lab 05/30/14 0015 05/30/14 0650  TROPONINI <0.03 <0.03   No results for input(s): PROBNP in the last 168 hours.   CHEMISTRY  Recent Labs Lab 05/28/14 2027 05/29/14 0515 06/01/14 0518 06/02/14 0513  NA 140 140  141 135  K 2.7* 3.7 5.1 4.3  CL 111 108 101 99  CO2 25 21 32 30  GLUCOSE 111* 182* 151* 147*  BUN 21 20 33* 31*  CREATININE 0.99 1.17 0.99 0.90  CALCIUM 8.7 8.8 8.9 8.2*   Estimated Creatinine Clearance: 77.6 mL/min (by C-G formula based on Cr of 0.9).   LIVER No results for input(s): AST, ALT, ALKPHOS, BILITOT, PROT, ALBUMIN, INR in the last 168 hours.   INFECTIOUS No results for input(s): LATICACIDVEN, PROCALCITON in the last 168 hours.   ENDOCRINE CBG (last 3)   Recent Labs  06/03/14 2330 06/04/14 0354 06/04/14 0735  GLUCAP 148* 137* 124*    IMAGING x48h Ct Maxillofacial  Ltd Wo Cm  06/03/2014   CLINICAL DATA:  Severe acute asthma reported with hypoxia and persistent cough.  EXAM: CT PARANASAL SINUS LIMITED WITHOUT CONTRAST  TECHNIQUE: Non-contiguous multidetector CT images of the paranasal sinuses were obtained in a single plane without contrast.  COMPARISON:  Neck CT dated 05/31/2014 and head CT dated 08/19/2011.  FINDINGS: Surgical absence of portions of the medial walls of both maxillary sinuses and surgical absence of the dividing walls of the ethmoid sinuses bilaterally. Air-fluid levels and mild mucosal thickening in both maxillary sinuses. Small amount of mucosal thickening in the posterior sphenoid sinus. Left sphenoid sinus mucosal thickening. Mild bilateral frontal sinus mucosal thickening. Mild mucosal thickening in  the remaining portions of the ethmoid sinuses. Old, healed nasal bone fractures. Left globe prosthesis.  IMPRESSION: 1. Mild chronic pansinusitis with acute bilateral maxillary sinusitis. 2. Postsurgical changes involving the maxillary and ethmoid sinuses, as described above. 3. The previously seen facial bone fractures have healed with an interval left globe prosthesis.   Electronically Signed   By: Gordan Payment M.D.   On: 06/03/2014 16:28    CT chest reviewed    ASSESSMENT / PLAN: Principal Problem:   Asthma with acute exacerbation Active  Problems:   Allergic rhinitis   Esophageal reflux   Hypokalemia   SOB (shortness of breath)   Malnutrition of moderate degree   Acute on chronic respiratory failure   Chronic cough   Chronic pain syndrome   Severe asthma   Acute maxillary sinusitis   PULMONARY  A: Baseline severe persistent asthma - on chronic prednisone Acute sinusitis, maxillary  Currently Acute Respiratory Failure with slow resolution on 06/03/14. Admitted as AE-asthma.   CT shows likely viral bronchilotis. Course complicated by  severe chronic upper airway cough that is likely impacting resoltions. Assoc Maxillary sinusitis on CT    - still with cough Viral screen never performed  P:   Ok to change IV medrol back to PO prednisone  per day, then slow taper down by  every 4 days to  per day and stay Change to tussionex bid. Pt can take Flutter  Wean oxygen to off  F/u resp viral screen prob d/c AM 1/18 Keep 1/28 OV appt as below   Future Appointments Date Time Provider Department Center  06/15/2014 9:00 AM Tammy Rogers Seeds, NP [facility] None   Dorcas Carrow Beeper  [phone]  Cell  [phone]  If no response or cell goes to voicemail, call beeper [phone]  06/04/2014 8:39 AM
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
## 2014-06-04 NOTE — Progress Notes (Signed)
Pt demanded to talk to Dr. Delford FieldWright. He asked me to call provider on call for [facility] to see if he could get Tussionex for his cough. I did and he would only let pt have a one time dose of Tessalon Pearls. Pt was unhappy he was not able to obtain Tussionex.  Even though pt has an allergy to Hydrocodone, he states he has taken it in the past with no problems. I told pt I would document his concerns in his chart.

## 2014-06-05 LAB — BASIC METABOLIC PANEL
Anion gap: 9 (ref 5–15)
BUN: 31 mg/dL — ABNORMAL HIGH (ref 6–23)
CALCIUM: 8.7 mg/dL (ref 8.4–10.5)
CO2: 32 mmol/L (ref 19–32)
CREATININE: 0.81 mg/dL (ref 0.50–1.35)
Chloride: 101 mEq/L (ref 96–112)
GFR calc Af Amer: >90 mL/min (ref >90)
GFR calc non Af Amer: >90 mL/min (ref >90)
Glucose, Bld: 93 mg/dL (ref 70–99)
Potassium: 4.7 mmol/L (ref 3.5–5.1)
Sodium: 142 mmol/L (ref 135–145)

## 2014-06-05 LAB — GLUCOSE, CAPILLARY
GLUCOSE-CAPILLARY: 161 mg/dL — AB (ref 70–99)
GLUCOSE-CAPILLARY: 178 mg/dL — AB (ref 70–99)
Glucose-Capillary: 93 mg/dL (ref 70–99)
Glucose-Capillary: 248 mg/dL — ABNORMAL HIGH (ref 70–99)
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
## 2014-06-05 NOTE — Progress Notes (Signed)
[facility] PROGRESS NOTE  [name] [name] BJY:[id number] DOB: 1951-04-04 DOA: 05/28/2014  PCP: Gwynneth Aliment, MD  Brief HPI: 64 year old white male presented with worsening shortness of breath. He was wheezing. He was admitted for management of acute asthma. He also has chronic pain issues. He is noted to have a drug-seeking behavior in the hospital.  Past medical history:  Past Medical History  Diagnosis Date  . 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 Cough   . Esophageal reflux   . Osteopenia   . Personal history of other diseases of digestive system   . Contact dermatitis and other eczema, due to unspecified cause   . Unspecified sinusitis (chronic)   . Allergic rhinitis   . Unspecified asthma(493.90)   . COPD (chronic obstructive pulmonary disease)   . Hypertension   . Seasonal allergies   . Ulcerative colitis   . H/O hiatal hernia   . Arthritis     spine  . Anxiety   . Multiple open wounds     "from esczema- scratching"  . Multiple facial bone fractures   . 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 History of bronchitis   . Pneumonia     in past had several times  . Shortness of breath     "sometime"  . Chronic lower back pain   . Kidney stones   . Depression     patient constantly hitting left side of face "due to pain"  . Pancreatitis   . Headache     PMH: migraines    Consultants: Pulmonology  Procedures:  Pulmonary Function Test Interpretation: The FVC, FEV1, FEV1/FVC ratio and FEF25-75% are reduced indicating airway obstruction. Conclusions: Pulmonary Function Diagnosis: Moderate severe Obstructive Airways Disease  Antibiotics: Finished a course of Azithromycin  Subjective: Patient continues to complain of shortness of breath. Continues to have coughing spells. Continues to complain of pain and spasms in the back which is chronic for the most part.   Objective: Vital Signs  Filed Vitals:   06/04/14  2045 06/05/14 0119 06/05/14 0545 06/05/14 0948  BP: 121/91  142/84   Pulse: 81  80   Temp: 98.2 F (36.8 C)  98.5 F (36.9 C)   TempSrc: Oral  Oral   Resp: 18  18   Height:      Weight:      SpO2: 95% 93% 96% 93%    Intake/Output Summary (Last 24 hours) at 06/05/14 1031 Last data filed at 06/05/14 1000  Gross per 24 hour  Intake   1560 ml  Output   1800 ml  Net   -240 ml   Filed Weights   05/30/14 0458 06/01/14 0541 06/02/14 0500  Weight: 65.726 kg (144 lb 14.4 oz) 67.813 kg (149 lb 8 oz) 65.25 kg (143 lb 13.6 oz)    General appearance: alert, cooperative, appears stated age and no distress Resp: No wheezing whatsoever. Much improved air entry. No crackles. Cardio: regular rate and rhythm, S1, S2 normal, no murmur, click, rub or gallop GI: soft, non-tender; bowel sounds normal; no masses,  no organomegaly Extremities: Limited mobility of the right upper extremity. This is due to previous trauma. Neurologic: No focal neurological deficits.  Lab Results:  Basic Metabolic Panel:  Recent Labs Lab 06/01/14 0518 06/02/14 0513 06/05/14 0535  NA 141 135 142  K 5.1 4.3 4.7  CL 101 99 101  CO2 32 30 32  GLUCOSE 151* 147* 93  BUN 33* 31* 31*  CREATININE 0.99 0.90 0.81  CALCIUM 8.9 8.2* 8.7   CBC:  Recent Labs Lab 06/01/14 0518  WBC 9.0  HGB 13.1  HCT 40.9  MCV 92.5  PLT 169   Cardiac Enzymes:  Recent Labs Lab 05/30/14 0015 05/30/14 0650  TROPONINI <0.03 <0.03   CBG:  Recent Labs Lab 06/04/14 0735 06/04/14 1148 06/04/14 1603 06/04/14 2150 06/05/14 0727  GLUCAP 124* 320* 192* 137* 93    Studies/Results: Ct Maxillofacial  Ltd Wo Cm  06/03/2014   CLINICAL DATA:  Severe acute asthma reported with hypoxia and persistent cough.  EXAM: CT PARANASAL SINUS LIMITED WITHOUT CONTRAST  TECHNIQUE: Non-contiguous multidetector CT images of the paranasal sinuses were obtained in a single plane without contrast.  COMPARISON:  Neck CT dated 05/31/2014 and head CT  dated 08/19/2011.  FINDINGS: Surgical absence of portions of the medial walls of both maxillary sinuses and surgical absence of the dividing walls of the ethmoid sinuses bilaterally. Air-fluid levels and mild mucosal thickening in both maxillary sinuses. Small amount of mucosal thickening in the posterior sphenoid sinus. Left sphenoid sinus mucosal thickening. Mild bilateral frontal sinus mucosal thickening. Mild mucosal thickening in the remaining portions of the ethmoid sinuses. Old, healed nasal bone fractures. Left globe prosthesis.  IMPRESSION: 1. Mild chronic pansinusitis with acute bilateral maxillary sinusitis. 2. Postsurgical changes involving the maxillary and ethmoid sinuses, as described above. 3. The previously seen facial bone fractures have healed with an interval left globe prosthesis.   Electronically Signed   By: Gordan Payment M.D.   On: 06/03/2014 16:28    Medications:  Scheduled: . azithromycin  500 mg Oral Daily  . benzonatate  100 mg Oral TID  . dextromethorphan  60 mg Oral BID  . feeding supplement (ENSURE COMPLETE)  237 mL Oral TID BM  . fluticasone  2 spray Each Nare BID  . gabapentin  300 mg Oral QHS  . guaiFENesin  600 mg Oral BID  . ipratropium-albuterol  3 mL Nebulization Q6H  . mometasone-formoterol  2 puff Inhalation BID  . montelukast  10 mg Oral QHS  . pantoprazole  40 mg Oral Daily  . pneumococcal 23 valent vaccine  0.5 mL Intramuscular Tomorrow-1000  . prednisoLONE acetate  1 drop Both Eyes Daily  . predniSONE  60 mg Oral Q breakfast  . sertraline  200 mg Oral Daily  . sodium chloride  3 spray Each Nare TID  . sodium chloride  3 mL Intravenous Q12H  . triamcinolone cream  1 application Topical BID   Continuous:  ZOX:WRUEAV chloride, acetaminophen, albuterol, chlorpheniramine-HYDROcodone, diazepam, guaiFENesin, HYDROmorphone (DILAUDID) injection, ibuprofen, ondansetron **OR** ondansetron (ZOFRAN) IV, oxyCODONE, sodium chloride  Assessment/Plan:  Principal  Problem:   Asthma with acute exacerbation Active Problems:   Allergic rhinitis   Esophageal reflux   Hypokalemia   SOB (shortness of breath)   Malnutrition of moderate degree   Acute on chronic respiratory failure   Chronic cough   Chronic pain syndrome   Severe asthma   Acute maxillary sinusitis    Severe persitent Asthma with Acute flare Pulmonary function test does show severe obstructive disease. The patient continues to have complaints of shortness of breath, but his lungs sound clear. I discussed with his nurses. Apparently he starts having coughing spells whenever somebody enters the room, but otherwise has been noted to be without any kind of discomfort or distress. Will check his room air saturations. CT chest and neck Reports  reviewed. Pulmonology recommendations reviewed.   Hyperglycemia  Secondary to steroids. Improving. Continue to monitor for now.   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 Patient has been demanding more pain medicines. He states that his respiratory issues have resulted in increasing pain throughout his body not easily controlled with OxyIR. Yesterday he stated morphine wasn't helping. It was changed over to Dilaudid. Valium is for muscle spasms. I did call his [facility] and verified that he was prescribed 10 mg of Valium 3 times a day as needed, but he was given 90 tablets. He does take up to 30 mg 3 times a day as needed. Monitor closely for drug-seeking behavior.  Bipolar disorder Continue current medications.  DVT Prophylaxis: SCDs    Code Status: Full code  Family Communication: No family at bedside  Disposition Plan: Patient tells me that he lives by himself and has been finding it difficult to take care of himself. But when I approached him regarding skilled nursing facility placement, he declines. And then he told the social worker that he has a nursing aide who comes to help him. We will see what pulmonology states today.  Anticipate discharge tomorrow morning.     LOS: 8 days   [facility] Surgery CenterKRISHNAN,Savino Whisenant  [facility] Pager [phone] 06/05/2014, 10:31 AM  If 7PM-7AM, please contact night-coverage at [email or web address], password [email or web address]

## 2014-06-05 NOTE — Progress Notes (Signed)
PT Cancellation Note  Patient Details Name: [name] [name] MRN: [id number] DOB: [birth year]   Cancelled Treatment:    Reason Eval/Treat Not Completed: Patient declined, no reason specified (pt refused x 2 today, will follow. )   Tamala SerUhlenberg, Shley Dolby Kistler 06/05/2014, 1:00 PM [phone]

## 2014-06-05 NOTE — Clinical Social Work Note (Signed)
CSW rec'd consult for ?SNF- CSW met with patient who is quite adamant about going home- "I've lived on my own in my section 8 home for years". He reports having a CNA 2 days a week for  2 hours who helps with groceries, etc. He also reports being able to see well enough to take medicaid transportation to MD appointments, etc-  "I have no interest in going to a facility". He is open to additional [facility] services as well as increasing his CNA's hours if possible- CSW has spoken with RNCM who is also aware and will assist with home needs prior to dc.CSW will sign off- pease re-consult it needs arise.  Eduard Clos, MSW, [place]

## 2014-06-06 LAB — GLUCOSE, CAPILLARY
GLUCOSE-CAPILLARY: 91 mg/dL (ref 70–99)
GLUCOSE-CAPILLARY: 180 mg/dL — AB (ref 70–99)

## 2014-06-06 MED ORDER — DIAZEPAM 10 MG PO TABS: 10.0000 mg | ORAL_TABLET | Freq: Three times a day (TID) | ORAL | Status: DC

## 2014-06-06 MED ORDER — OXYCODONE HCL 10 MG PO TABS: 10.0000 mg | ORAL_TABLET | Freq: Four times a day (QID) | ORAL | Status: DC | PRN

## 2014-06-06 MED ORDER — PREDNISONE 20 MG PO TABS: ORAL_TABLET | ORAL | Status: DC

## 2014-06-06 MED ORDER — AZITHROMYCIN 500 MG PO TABS: 500.0000 mg | ORAL_TABLET | Freq: Every day | ORAL | Status: DC

## 2014-06-06 MED ORDER — GABAPENTIN 300 MG PO CAPS: 300.0000 mg | ORAL_CAPSULE | Freq: Every day | ORAL | Status: DC

## 2014-06-06 MED ORDER — BENZONATATE 100 MG PO CAPS: 100.0000 mg | ORAL_CAPSULE | Freq: Three times a day (TID) | ORAL | Status: DC

## 2014-06-06 MED ORDER — HYDROCOD POLST-CHLORPHEN POLST 10-8 MG/5ML PO LQCR: 5.0000 mL | Freq: Two times a day (BID) | ORAL | Status: DC | PRN

## 2014-06-06 MED ORDER — GUAIFENESIN ER 600 MG PO TB12: 600.0000 mg | ORAL_TABLET | Freq: Two times a day (BID) | ORAL | Status: DC

## 2014-06-06 NOTE — Progress Notes (Signed)
Nurse reviewed discharge instructions with pt.  Pt verbalized understanding of discharge instructions, follow up appointments and new medications.  Pt given 4 of his prescriptions medications to take home.  Pt given taxi voucher for transport to home.

## 2014-06-06 NOTE — Progress Notes (Signed)
CSW assisting with transportation home. Discussed best means of transport with pt / RN.  Pt reports he has no family / friends to assist him home today. Pt reports he can manage in / out of taxi independently, with his cane. Pt reports he has house keys and can get himself into his home independently. RN feels taxi is appropriate. Pt agrees with this plan. Taxi voucher provided to [employer]. She will assist making arrangements once pt is ready for d/c.  Cori RazorJamie Calista Crain LCSW [phone]

## 2014-06-06 NOTE — Discharge Summary (Signed)
[facility]  Physician Discharge Summary   Patient ID: [name] [name] MRN: [id number] DOB/AGE: [birth year] 64 y.o.  Admit date: 05/28/2014 Discharge date: 06/06/2014  PCP: Gwynneth Aliment, MD  DISCHARGE DIAGNOSES:  Principal Problem:   Asthma with acute exacerbation Active Problems:   Allergic rhinitis   Esophageal reflux   Hypokalemia   SOB (shortness of breath)   Malnutrition of moderate degree   Acute on chronic respiratory failure   Chronic cough   Chronic pain syndrome   Severe asthma   Acute maxillary sinusitis   RECOMMENDATIONS FOR OUTPATIENT FOLLOW UP: 1. Patient has a close follow-up with Dr. Shan Levans.  DISCHARGE CONDITION: fair  Diet recommendation: Regular  Filed Weights   05/30/14 0458 06/01/14 0541 06/02/14 0500  Weight: 65.726 kg (144 lb 14.4 oz) 67.813 kg (149 lb 8 oz) 65.25 kg (143 lb 13.6 oz)    INITIAL HISTORY: 64 year old white male presented with worsening shortness of breath. He was wheezing. He was admitted for management of acute asthma. He also has chronic pain issues. He is noted to have a drug-seeking behavior in the hospital.  Consultations:  Pulmonology  Procedures: Pulmonary Function Test Interpretation: The FVC, FEV1, FEV1/FVC ratio and FEF25-75% are reduced indicating airway obstruction. Conclusions: Pulmonary Function Diagnosis: Moderate severe Obstructive Airways Disease  HOSPITAL COURSE:   Severe persitent Asthma with Acute flare/viral bronchitis Pulmonary function test does show severe obstructive disease. During the course of this hospitalization, patient continued to have complaints of shortness of breath, even though there was significant improvement in his lung sounds and the wheezing. He was observed not to have any cough for a long time, but whenever somebody would walk into the room, he would start having coughing spells. He was demanding more and more pain medications. He was trying to dictate his  medication dosage. He was subsequently seen by his own pulmonologist, Dr. Delford Field. His recommendations were followed. Steroid taper will be initiated. He'll be prescribed some Tussionex. Patient is very resistant to getting discharged. He is saturating well on room air. Medically stable for discharge. He underwent CT scan of his chest, neck and sinuses which was addressed by pulmonology. Please see report below. Respiratory viral panel was sent and is pending.  Hyperglycemia  Secondary to steroids. Should improve as steroid is tapered off.  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 Patient has been demanding more pain medicines. He states that his respiratory issues have resulted in increasing pain throughout his body not easily controlled with OxyIR. He was initially started on morphine and then changed over to Dilaudid. It has been very difficult to manage this issue due to his behavior. He should follow-up with his outpatient providers for same.  Bipolar disorder Continue current medications.  Patient was seen by the physical and occupational therapist. Skilled nursing facility was recommended. Social worker did speak to him about this yesterday and he declined. Today, he wants to discuss with them again part at the same time states that he will go home. I wonder if he is trying to manipulate in order to stay in the hospital. Have explained to the patient that he is medically stable and will be discharged today.   PERTINENT LABS:  The results of significant diagnostics from this hospitalization (including imaging, microbiology, ancillary and laboratory) are listed below for reference.    Labs: Basic Metabolic Panel:  Recent Labs Lab 06/01/14 0518 06/02/14 0513 06/05/14 0535  NA 141 135 142  K 5.1 4.3 4.7  CL 101 99 101  CO2 32 30 32  GLUCOSE 151* 147* 93  BUN 33* 31* 31*  CREATININE 0.99 0.90 0.81  CALCIUM 8.9 8.2* 8.7    CBC:  Recent Labs Lab 06/01/14 0518  WBC 9.0  HGB 13.1  HCT 40.9  MCV 92.5  PLT 169   CBG:  Recent Labs Lab 06/05/14 0727 06/05/14 1159 06/05/14 1606 06/05/14 2154 06/06/14 0714  GLUCAP 93 161* 248* 178* 91     IMAGING STUDIES Ct Soft Tissue Neck W Contrast  05/31/2014   CLINICAL DATA:  Pt with chronic solid (meat/bread) more than liquid dysphagia per interview that has been ongoing for "years". He admits that at times he must place his fingers to back of throat to regurgitate solids (sometimes only secretions) that he can not clear (pointing to pharynx) on occasion. Trouble swallowing  EXAM: CT NECK WITH CONTRAST  TECHNIQUE: Multidetector CT imaging of the neck was performed using the standard protocol following the bolus administration of intravenous contrast.  CONTRAST:  OMNIPAQUE IOHEXOL 300 MG/ML  SOLN  COMPARISON:  None.  FINDINGS: Pharynx and larynx: Normal.  Salivary glands: Normal.  Thyroid: Normal.  Lymph nodes: No pathologic lymphadenopathy.  Vascular: Bilateral carotid artery atherosclerosis. Carotid artery and jugular veins are patent.  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 The right globe is normal.  Mastoids and visualized paranasal sinuses: Mastoid sinuses are clear. Small air-fluid level in the right maxillary sinus. Mild right ethmoid sinus mucosal thickening. Prior turbinate resection bilaterally.  Skeleton: There is loss of the normal cervical lordosis. There is 3 mm of anterolisthesis of C2 on C3 secondary to facet disease. There is degenerative disc disease at C3-4, C4-5, C5-6 and C6-7 with disc space narrowing. There is bilateral uncovertebral degenerative changes and facet arthropathy from C3-4 through C6-7 with bilateral foraminal encroachment. There is no lytic or sclerotic osseous lesion.  Upper chest: There is a bandlike area of airspace disease in the right upper lobe likely reflecting an area of atelectasis or scarring.   IMPRESSION: 1. No neck CT findings to explain the patient's dysphagia. If there is persisting clinical concern regarding dysphagia, this would be better assessed with a barium swallow function test or upper endoscopy. 2. Cervical spine spondylosis as described above. 3. Bilateral carotid artery atherosclerosis.   Electronically Signed   By: Elige Ko   On: 05/31/2014 19:24   Ct Chest Wo Contrast  05/31/2014   CLINICAL DATA:  Chronic cough.  Acute respiratory failure.  EXAM: CT CHEST WITHOUT CONTRAST  TECHNIQUE: Multidetector CT imaging of the chest was performed following the standard protocol without IV contrast.  COMPARISON:  Chest radiograph 03/22/2013.  FINDINGS: Mediastinum/Nodes: No pathologically enlarged mediastinal or axillary lymph nodes. Hilar regions are difficult to definitively evaluate without IV contrast but appear grossly unremarkable. Coronary artery calcification. Heart size normal. No pericardial effusion.  Lungs/Pleura: Mild scarring at the apex of the right upper lobe. Scattered bilateral peribronchovascular nodularity and mild bronchial wall thickening, somewhat basilar predominant. Triangular-shaped ground-glass in the peripheral right upper lobe does not have masslike features and may be infectious or inflammatory in etiology. Scarring is another consideration. No pleural fluid. Airway is unremarkable.  Upper abdomen: Visualized portions of the liver, gallbladder and adrenal glands are unremarkable. Punctate right renal stone. Visualized portions of the kidneys, spleen, pancreas and stomach are otherwise unremarkable.  Musculoskeletal: No worrisome lytic or sclerotic lesions. Degenerative changes are seen in the spine. Lower thoracic  and upper lumbar compression fractures are likely stable from 03/22/2013.  IMPRESSION: 1. 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 An acute  infectious bronchiolitis is not excluded. 2. Coronary artery calcification. 3. Right renal stone.   Electronically Signed   By: Leanna Battles M.D.   On: 05/31/2014 17:03   Dg Chest Port 1 View  05/28/2014   CLINICAL DATA:  Initial evaluation for shortness of breath cough congestion for 3 days history of COPD asthma former smoker hiatal hernia pneumonia bronchitis  EXAM: PORTABLE CHEST - 1 VIEW  COMPARISON:  Multiple prior studies  FINDINGS: Hyperinflation consistent with COPD. Markedly on coiled aorta. Heart size and vascular pattern normal. Prominent vasculature to the right of the trachea. Lungs clear. No effusions.  IMPRESSION: Stable findings when comparing back to 2011.   Electronically Signed   By: Esperanza Heir M.D.   On: 05/28/2014 20:48   Ct Maxillofacial  Ltd Wo Cm  06/03/2014   CLINICAL DATA:  Severe acute asthma reported with hypoxia and persistent cough.  EXAM: CT PARANASAL SINUS LIMITED WITHOUT CONTRAST  TECHNIQUE: Non-contiguous multidetector CT images of the paranasal sinuses were obtained in a single plane without contrast.  COMPARISON:  Neck CT dated 05/31/2014 and head CT dated 08/19/2011.  FINDINGS: Surgical absence of portions of the medial walls of both maxillary sinuses and surgical absence of the dividing walls of the ethmoid sinuses bilaterally. Air-fluid levels and mild mucosal thickening in both maxillary sinuses. Small amount of mucosal thickening in the posterior sphenoid sinus. Left sphenoid sinus mucosal thickening. Mild bilateral frontal sinus mucosal thickening. Mild mucosal thickening in the remaining portions of the ethmoid sinuses. Old, healed nasal bone fractures. Left globe prosthesis.  IMPRESSION: 1. Mild chronic pansinusitis with acute bilateral maxillary sinusitis. 2. Postsurgical changes involving the maxillary and ethmoid sinuses, as described above. 3. The previously seen facial bone fractures have healed with an interval left globe prosthesis.   Electronically  Signed   By: Gordan Payment M.D.   On: 06/03/2014 16:28    DISCHARGE EXAMINATION: Filed Vitals:   06/05/14 1447 06/05/14 2157 06/06/14 0214 06/06/14 0605  BP:  141/79  178/94  Pulse:  72  79  Temp:  98 F (36.7 C)  98.7 F (37.1 C)  TempSrc:  Oral  Oral  Resp:  18  18  Height:      Weight:      SpO2: 95% 95% 96% 96%   General appearance: alert, cooperative, appears stated age and no distress Resp: coarse breath sounds. No wheezing. No crackles. Cardio: regular rate and rhythm, S1, S2 normal, no murmur, click, rub or gallop GI: soft, non-tender; bowel sounds normal; no masses,  no organomegaly   DISPOSITION: Home versus SNF  Discharge Instructions    Diet - low sodium heart healthy    Complete by:  As directed      Discharge instructions    Complete by:  As directed   Be sure to follow up with your pulmonologist as scheduled.     Increase activity slowly    Complete by:  As directed            ALLERGIES:  Allergies  Allergen Reactions  . Bacitracin-Polymyxin B Other (See Comments)    Polysporin allergy per opthalmologist.   . Bee Venom Anaphylaxis  . Codeine Other (See Comments)    Makes patient clear throat   . Norco [Hydrocodone-Acetaminophen] Other (See  Comments)    causes frontal headaches  . Penicillins Other (See Comments)    Childhood allergy/ makes patient clear throat a lot.  . Tape Hives and Other (See Comments)    "plastic tape"  . Chlorhexidine Itching and Rash     Current Discharge Medication List    START taking these medications   Details  azithromycin (ZITHROMAX) 500 MG tablet Take 1 tablet (500 mg total) by mouth daily. Qty: 4 tablet, Refills: 0    benzonatate (TESSALON) 100 MG capsule Take 1 capsule (100 mg total) by mouth 3 (three) times daily. Qty: 30 capsule, Refills: 0    chlorpheniramine-HYDROcodone (TUSSIONEX) 10-8 MG/5ML LQCR Take 5 mLs by mouth every 12 (twelve) hours as needed for cough. Qty: 115 mL, Refills: 0    gabapentin  (NEURONTIN) 300 MG capsule Take 1 capsule (300 mg total) by mouth at bedtime. Qty: 30 capsule, Refills: 0    guaiFENesin (MUCINEX) 600 MG 12 hr tablet Take 1 tablet (600 mg total) by mouth 2 (two) times daily. Qty: 30 tablet, Refills: 0      CONTINUE these medications which have CHANGED   Details  diazepam (VALIUM) 10 MG tablet Take 1 tablet (10 mg total) by mouth 3 (three) times daily. Qty: 10 tablet, Refills: 0    Oxycodone HCl 10 MG TABS Take 1 tablet (10 mg total) by mouth every 6 (six) hours as needed (pain). Qty: 20 tablet, Refills: 0    predniSONE (DELTASONE) 20 MG tablet Take 3 tablets once daily for 4 days, then take 2 tablets once daily for 4 days, then take 1 tablet once daily till seen by Dr. Delford FieldWright. Qty: 30 tablet, Refills: 0      CONTINUE these medications which have NOT CHANGED   Details  albuterol (PROVENTIL HFA;VENTOLIN HFA) 108 (90 BASE) MCG/ACT inhaler Inhale 2 puffs into the lungs every 6 (six) hours as needed for wheezing or shortness of breath (wheezing & shortness of breath).     albuterol (PROVENTIL) (2.5 MG/3ML) 0.083% nebulizer solution Take 2.5 mg by nebulization every 4 (four) hours as needed for wheezing or shortness of breath (wheezing & shortness of breath).     Aspirin-Salicylamide-Caffeine (BC HEADACHE POWDER PO) Take 1 packet by mouth every 4 (four) hours as needed (pain).     desoximetasone (TOPICORT) 0.05 % cream Apply 1 application topically 2 (two) times daily as needed (rash).     fluticasone (FLONASE) 50 MCG/ACT nasal spray Place 2 sprays into both nostrils daily.    hydrOXYzine (ATARAX/VISTARIL) 25 MG tablet Take 25 mg by mouth 3 (three) times daily as needed for itching (itching).     ibuprofen (ADVIL,MOTRIN) 800 MG tablet Take 1 tablet (800 mg total) by mouth 3 (three) times daily with meals. Qty: 21 tablet, Refills: 0    mometasone-formoterol (DULERA) 200-5 MCG/ACT AERO Inhale 2 puffs into the lungs 2 (two) times daily. Qty: 2 Inhaler,  Refills: 0    montelukast (SINGULAIR) 10 MG tablet Take 1 tablet (10 mg total) by mouth at bedtime. Qty: 30 tablet, Refills: 11    omeprazole (PRILOSEC) 40 MG capsule Take 40 mg by mouth 2 (two) times daily.    prednisoLONE acetate (PRED FORTE) 1 % ophthalmic suspension Place 1 drop into both eyes daily.     sertraline (ZOLOFT) 100 MG tablet Take 200 mg by mouth daily.     triamcinolone cream (KENALOG) 0.1 % Apply 1 application topically 2 (two) times daily. Refills: 1      STOP  taking these medications     desoximetasone (TOPICORT) 0.25 % cream        Follow-up Information    Follow up with Gwynneth Aliment, MD. Schedule an appointment as soon as possible for a visit in 1 week.   Specialty:  Internal Medicine   Why:  post hospitalization follow up   Contact information:   [address] [place] [address] [phone]       Follow up with Shan Levans, MD.   Specialty:  Pulmonary Disease   Why:  as scheduled on 06/15/14 at [facility] information:   [address] [place] [address] [phone]       TOTAL DISCHARGE TIME: 35 minutes  [facility]  [facility] Pager [phone]  06/06/2014, 10:43 AM

## 2014-06-06 NOTE — Discharge Instructions (Signed)

## 2014-06-07 ENCOUNTER — Encounter (HOSPITAL_COMMUNITY): Payer: Self-pay | Admitting: Emergency Medicine

## 2014-06-07 ENCOUNTER — Emergency Department (HOSPITAL_COMMUNITY)
Admission: EM | Admit: 2014-06-07 | Discharge: 2014-06-07 | Disposition: A | Discharge status: Home / Self Care | Payer: Medicaid Other | Attending: Emergency Medicine | Admitting: Emergency Medicine

## 2014-06-07 ENCOUNTER — Emergency Department (HOSPITAL_COMMUNITY): Payer: Medicaid Other

## 2014-06-07 DIAGNOSIS — Z7952 Long term (current) use of systemic steroids: Secondary | ICD-10-CM | POA: Insufficient documentation

## 2014-06-07 DIAGNOSIS — Y9289 Other specified places as the place of occurrence of the external cause: Secondary | ICD-10-CM | POA: Insufficient documentation

## 2014-06-07 DIAGNOSIS — W2203XA Walked into furniture, initial encounter: Secondary | ICD-10-CM | POA: Insufficient documentation

## 2014-06-07 DIAGNOSIS — Z79899 Other long term (current) drug therapy: Secondary | ICD-10-CM | POA: Diagnosis not present

## 2014-06-07 DIAGNOSIS — Z7951 Long term (current) use of inhaled steroids: Secondary | ICD-10-CM | POA: Insufficient documentation

## 2014-06-07 DIAGNOSIS — Z8701 Personal history of pneumonia (recurrent): Secondary | ICD-10-CM | POA: Diagnosis not present

## 2014-06-07 DIAGNOSIS — Z8619 Personal history of other infectious and parasitic diseases: Secondary | ICD-10-CM | POA: Insufficient documentation

## 2014-06-07 DIAGNOSIS — Z87442 Personal history of urinary calculi: Secondary | ICD-10-CM | POA: Diagnosis not present

## 2014-06-07 DIAGNOSIS — Y9389 Activity, other specified: Secondary | ICD-10-CM | POA: Insufficient documentation

## 2014-06-07 DIAGNOSIS — Z872 Personal history of diseases of the skin and subcutaneous tissue: Secondary | ICD-10-CM | POA: Insufficient documentation

## 2014-06-07 DIAGNOSIS — S42291A Other displaced fracture of upper end of right humerus, initial encounter for closed fracture: Secondary | ICD-10-CM | POA: Insufficient documentation

## 2014-06-07 DIAGNOSIS — Z87891 Personal history of nicotine dependence: Secondary | ICD-10-CM | POA: Diagnosis not present

## 2014-06-07 DIAGNOSIS — Z880 Allergy status to penicillin: Secondary | ICD-10-CM | POA: Diagnosis not present

## 2014-06-07 DIAGNOSIS — H548 Legal blindness, as defined in USA: Secondary | ICD-10-CM | POA: Insufficient documentation

## 2014-06-07 DIAGNOSIS — G8929 Other chronic pain: Secondary | ICD-10-CM | POA: Insufficient documentation

## 2014-06-07 DIAGNOSIS — J449 Chronic obstructive pulmonary disease, unspecified: Secondary | ICD-10-CM | POA: Insufficient documentation

## 2014-06-07 DIAGNOSIS — Y998 Other external cause status: Secondary | ICD-10-CM | POA: Diagnosis not present

## 2014-06-07 DIAGNOSIS — I10 Essential (primary) hypertension: Secondary | ICD-10-CM | POA: Diagnosis not present

## 2014-06-07 DIAGNOSIS — M1990 Unspecified osteoarthritis, unspecified site: Secondary | ICD-10-CM | POA: Insufficient documentation

## 2014-06-07 DIAGNOSIS — F419 Anxiety disorder, unspecified: Secondary | ICD-10-CM | POA: Diagnosis not present

## 2014-06-07 DIAGNOSIS — K219 Gastro-esophageal reflux disease without esophagitis: Secondary | ICD-10-CM | POA: Diagnosis not present

## 2014-06-07 DIAGNOSIS — F329 Major depressive disorder, single episode, unspecified: Secondary | ICD-10-CM | POA: Insufficient documentation

## 2014-06-07 DIAGNOSIS — Z791 Long term (current) use of non-steroidal anti-inflammatories (NSAID): Secondary | ICD-10-CM | POA: Diagnosis not present

## 2014-06-07 DIAGNOSIS — W19XXXA Unspecified fall, initial encounter: Secondary | ICD-10-CM

## 2014-06-07 DIAGNOSIS — S4991XA Unspecified injury of right shoulder and upper arm, initial encounter: Secondary | ICD-10-CM | POA: Diagnosis present

## 2014-06-07 LAB — CBC WITH DIFFERENTIAL/PLATELET
BASOS PCT: 0 % (ref 0–1)
Basophils Absolute: 0 10*3/uL (ref 0.0–0.1)
Eosinophils Absolute: 0.1 10*3/uL (ref 0.0–0.7)
Eosinophils Relative: 0 % (ref 0–5)
HCT: 38.8 % — ABNORMAL LOW (ref 39.0–52.0)
Hemoglobin: 13 g/dL (ref 13.0–17.0)
LYMPHS PCT: 12 % (ref 12–46)
Lymphs Abs: 1.9 10*3/uL (ref 0.7–4.0)
MCH: 30.7 pg (ref 26.0–34.0)
MCHC: 33.5 g/dL (ref 30.0–36.0)
MCV: 91.7 fL (ref 78.0–100.0)
Monocytes Absolute: 0.9 10*3/uL (ref 0.1–1.0)
Monocytes Relative: 6 % (ref 3–12)
NEUTROS ABS: 12.2 10*3/uL — AB (ref 1.7–7.7)
NEUTROS PCT: 82 % — AB (ref 43–77)
Platelets: 214 10*3/uL (ref 150–400)
RBC: 4.23 MIL/uL (ref 4.22–5.81)
RDW: 13.6 % (ref 11.5–15.5)
WBC: 15 10*3/uL — AB (ref 4.0–10.5)

## 2014-06-07 LAB — I-STAT CHEM 8, ED
BUN: 24 mg/dL — ABNORMAL HIGH (ref 6–23)
CALCIUM ION: 1.03 mmol/L — AB (ref 1.13–1.30)
Chloride: 101 mEq/L (ref 96–112)
Creatinine, Ser: 0.9 mg/dL (ref 0.50–1.35)
Glucose, Bld: 164 mg/dL — ABNORMAL HIGH (ref 70–99)
HEMATOCRIT: 40 % (ref 39.0–52.0)
HEMOGLOBIN: 13.6 g/dL (ref 13.0–17.0)
Potassium: 3.5 mmol/L (ref 3.5–5.1)
Sodium: 137 mmol/L (ref 135–145)
TCO2: 21 mmol/L (ref 0–100)

## 2014-06-07 LAB — RESPIRATORY VIRUS PANEL
ADENOVIRUS: NEGATIVE
Influenza A: NEGATIVE
Influenza B: NEGATIVE
Metapneumovirus: POSITIVE — AB
Parainfluenza 1: NEGATIVE
Parainfluenza 2: NEGATIVE
Parainfluenza 3: NEGATIVE
RESPIRATORY SYNCYTIAL VIRUS B: NEGATIVE
Respiratory Syncytial Virus A: NEGATIVE
Rhinovirus: NEGATIVE

## 2014-06-07 MED ORDER — OXYCODONE HCL 10 MG PO TABS: 10.0000 mg | ORAL_TABLET | Freq: Four times a day (QID) | ORAL | Status: DC | PRN

## 2014-06-07 MED ORDER — HYDROMORPHONE HCL 1 MG/ML IJ SOLN
1.0000 mg | Freq: Once | INTRAMUSCULAR | Status: AC
  Administered 2014-06-07: 1 mg via INTRAMUSCULAR
  Filled 2014-06-07: qty 1

## 2014-06-07 MED ORDER — HYDROMORPHONE HCL 1 MG/ML IJ SOLN
0.5000 mg | Freq: Once | INTRAMUSCULAR | Status: AC
Start: 2014-06-07 — End: 2014-06-07
  Administered 2014-06-07: 0.5 mg via INTRAMUSCULAR

## 2014-06-07 MED ORDER — OXYCODONE-ACETAMINOPHEN 5-325 MG PO TABS
1.0000 | ORAL_TABLET | Freq: Once | ORAL | Status: AC
  Administered 2014-06-07: 1 via ORAL
  Filled 2014-06-07: qty 1

## 2014-06-07 MED ORDER — HYDROMORPHONE HCL 1 MG/ML IJ SOLN
0.5000 mg | Freq: Once | INTRAMUSCULAR | Status: DC
  Filled 2014-06-07: qty 1

## 2014-06-07 NOTE — ED Provider Notes (Signed)
CSN: [id number]     Arrival date & time 06/07/14  [id number] History   First MD Initiated Contact with Patient 06/07/14 0848     No chief complaint on file.    (Consider location/radiation/quality/duration/timing/severity/associated sxs/prior Treatment) HPI   64 year old male with  blindness, osteopenia, COPD, anxiety who presents for evaluation of a recent fall. Patient reported an hour ago he was getting up from his bed to use the bathroom and accidentally tripped over a coffee table, fell forward striking right shoulder and right elbow against TV console and fell to the ground. He was unable to get up due to the pain on his right arm. Describe pain as "hurt" 9 out of 10, nonradiating. He reported a Child psychotherapistsocial worker has called to check up on him and he notified them that he has fallen, EMS arrived and brought patient here to the ER. He suffered a skin tear to his right elbow. She states he is up-to-date with his tetanus. He denies any new numbness. No specific treatment tried. He denies having any head injury, loss of consciousness, neck pain or back pain. Denies any other injury. He is right-hand dominant.  Past Medical History  Diagnosis Date  . 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 Cough   . Esophageal reflux   . Osteopenia   . Personal history of other diseases of digestive system   . Contact dermatitis and other eczema, due to unspecified cause   . Unspecified sinusitis (chronic)   . Allergic rhinitis   . Unspecified asthma(493.90)   . COPD (chronic obstructive pulmonary disease)   . Hypertension   . Seasonal allergies   . Ulcerative colitis   . H/O hiatal hernia   . Arthritis     spine  . Anxiety   . Multiple open wounds     "from esczema- scratching"  . Multiple facial bone fractures   . 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 History of bronchitis   . Pneumonia     in past had several times  . Shortness of breath     "sometime"  . Chronic  lower back pain   . Kidney stones   . Depression     patient constantly hitting left side of face "due to pain"  . Pancreatitis   . Headache     PMH: migraines   Past Surgical History  Procedure Laterality Date  . Litrotripsy    . Orif distal radius fracture  09/12/11    right  . Fracture surgery    . Metacarpal pinning      right hand  . Patella fracture surgery      right  . Tonsillectomy and adenoidectomy    . Eye surgery      3 Corneal transplant (bilateral eyes)  . Hardware removal  10/09/2011    Procedure: HARDWARE REMOVAL;  Surgeon: Nadara MustardMarcus V Duda, MD;  [facility];  Service: Orthopedics;  Laterality: Right;  Removal Deep Hardware Right Wrist, placement antibiotic beads.  . Orif wrist fracture Right 06/15/2013    Procedure: RIGHT RADIUS PSTEOTOMY, DISTAL ULNA RESECTION, AND DIGITAL FLEXOR LENGTHENING AS NEEDED;  Surgeon: Marlowe ShoresMatthew A Weingold, MD;  Location: [facility];  Service: Orthopedics;  Laterality: Right;  . Carpal tunnel release Right 06/15/2013    Procedure: RIGHT CARPAL TUNNEL RELEASE;  Surgeon: Marlowe ShoresMatthew A Weingold, MD;  Location: [facility];  Service: Orthopedics;  Laterality:  Right;  . Hernia repair    . Colonoscopy w/ biopsies and polypectomy    . Radiology with anesthesia N/A 03/09/2014    Procedure: MRI - Cervical and Lumbar without Contrast;  Surgeon: Medication Radiologist, MD;  Location: [facility] OR;  Service: Radiology;  Laterality: N/A;   Family History  Problem Relation Age of Onset  . Allergies Mother     atopic  . Asthma Mother   . Heart failure Mother     CHF  . Other Father   . Anesthesia problems Neg Hx    History  Substance Use Topics  . Smoking status: Former Smoker -- 1.00 packs/day for 3 years    Types: Cigarettes, Pipe    Quit date: 05/20/1983  . Smokeless tobacco: Never Used  . Alcohol Use: Yes     Comment: "scotch once a year"    Review of Systems  Constitutional: Negative for fever.  Musculoskeletal:  Positive for arthralgias. Negative for neck pain.  Skin: Positive for wound.  Neurological: Negative for numbness and headaches.      Allergies  Bacitracin-polymyxin b; Bee venom; Codeine; Norco; Penicillins; Tape; and Chlorhexidine  Home Medications   Prior to Admission medications   Medication Sig Start Date End Date Taking? Authorizing Provider  albuterol (PROVENTIL HFA;VENTOLIN HFA) 108 (90 BASE) MCG/ACT inhaler Inhale 2 puffs into the lungs every 6 (six) hours as needed for wheezing or shortness of breath (wheezing & shortness of breath).     Historical Provider, MD  albuterol (PROVENTIL) (2.5 MG/3ML) 0.083% nebulizer solution Take 2.5 mg by nebulization every 4 (four) hours as needed for wheezing or shortness of breath (wheezing & shortness of breath).     Historical Provider, MD  Aspirin-Salicylamide-Caffeine (BC HEADACHE POWDER PO) Take 1 packet by mouth every 4 (four) hours as needed (pain).     Historical Provider, MD  azithromycin (ZITHROMAX) 500 MG tablet Take 1 tablet (500 mg total) by mouth daily. 06/06/14   Osvaldo Shipper, MD  benzonatate (TESSALON) 100 MG capsule Take 1 capsule (100 mg total) by mouth 3 (three) times daily. 06/06/14   Osvaldo Shipper, MD  chlorpheniramine-HYDROcodone (TUSSIONEX) 10-8 MG/5ML LQCR Take 5 mLs by mouth every 12 (twelve) hours as needed for cough. 06/06/14   Osvaldo Shipper, MD  desoximetasone (TOPICORT) 0.05 % cream Apply 1 application topically 2 (two) times daily as needed (rash).     Historical Provider, MD  diazepam (VALIUM) 10 MG tablet Take 1 tablet (10 mg total) by mouth 3 (three) times daily. 06/06/14   Osvaldo Shipper, MD  fluticasone (FLONASE) 50 MCG/ACT nasal spray Place 2 sprays into both nostrils daily.    Historical Provider, MD  gabapentin (NEURONTIN) 300 MG capsule Take 1 capsule (300 mg total) by mouth at bedtime. 06/06/14   Osvaldo Shipper, MD  guaiFENesin (MUCINEX) 600 MG 12 hr tablet Take 1 tablet (600 mg total) by mouth 2 (two) times  daily. 06/06/14   Osvaldo Shipper, MD  hydrOXYzine (ATARAX/VISTARIL) 25 MG tablet Take 25 mg by mouth 3 (three) times daily as needed for itching (itching).     Historical Provider, MD  ibuprofen (ADVIL,MOTRIN) 800 MG tablet Take 1 tablet (800 mg total) by mouth 3 (three) times daily with meals. 01/04/14   Lauren Parker, PA-C  mometasone-formoterol (DULERA) 200-5 MCG/ACT AERO Inhale 2 puffs into the lungs 2 (two) times daily. 05/17/14   Storm Frisk, MD  montelukast (SINGULAIR) 10 MG tablet Take 1 tablet (10 mg total) by mouth at bedtime. 05/17/14  Storm Frisk, MD  omeprazole (PRILOSEC) 40 MG capsule Take 40 mg by mouth 2 (two) times daily.    Historical Provider, MD  Oxycodone HCl 10 MG TABS Take 1 tablet (10 mg total) by mouth every 6 (six) hours as needed (pain). 06/06/14   Osvaldo Shipper, MD  prednisoLONE acetate (PRED FORTE) 1 % ophthalmic suspension Place 1 drop into both eyes daily.     Historical Provider, MD  predniSONE (DELTASONE) 20 MG tablet Take 3 tablets once daily for 4 days, then take 2 tablets once daily for 4 days, then take 1 tablet once daily till seen by Dr. Delford Field. 06/06/14   Osvaldo Shipper, MD  sertraline (ZOLOFT) 100 MG tablet Take 200 mg by mouth daily.     Historical Provider, MD  triamcinolone cream (KENALOG) 0.1 % Apply 1 application topically 2 (two) times daily. 03/03/14   Historical Provider, MD   There were no vitals taken for this visit. Physical Exam  Constitutional:  [name] [name] male, legally blind, resting in bed appears to be in no acute distress.  HENT:  Head: Normocephalic and atraumatic.  Neck: Normal range of motion. Neck supple.  Cardiovascular: Normal rate and regular rhythm.   Pulmonary/Chest: Effort normal and breath sounds normal.  Abdominal: Soft. There is no tenderness.  Musculoskeletal: He exhibits tenderness (Right shoulder: Diffuse tenderness throughout shoulder without focal point tenderness. Decreased range of motion secondary to  pain, no gross deformity. Right elbow: Superficial skin tear at posterior epicondyles with decreased elbow flexion and extension ).  Nursing note and vitals reviewed.   ED Course  Procedures (including critical care time)  Patient had a mechanical fall when he injured his right shoulder and right elbow. Although there is no gross deformity, x-ray ordered to rule out fracture given his history of osteopenia. He has no other injury noted on exam. He does have a small skin tear to his right elbow however he is up-to-date with tetanus. Pain medication given.  12:35 PM X-ray of right shoulder demonstrate a comminuted and impacted right humerus head and neck fracture with no associated dislocation. Right elbow x-ray is negative for fracture or dislocation. Please note that patient was recently admitted and discharged for asthma with acute exacerbation in which he was hospitalized for more than a week and was discharged yesterday. Since patient lives at home by himself, legally blind, and injured his right shoulder, he does not think that he can perform his ADL of feel safe going home. I have consult did orthopedics specialist who has evaluated patient and felt that this is a nonsurgical fracture. Therefore, plan to consult medicine for admission both for pain control, and eventual placement for rehabilitation. Care discussed with Dr. Madilyn Hook.  12:45 PM I have consulted with [facility], Dr. Mahala Menghini who recommend social work involvement to see if pt qualify for inpt stay vs SNF care.  He will also evaluate pt as well.    1:52 PM Patient now states he does not want to go to a skilled nursing facility. Social worker has evaluated the patient and provide additional resources. He does not meet inpatient criteria for admission. Therefore, patient will receive a sling, pain medication, orthopedic referral, and return precautions discussed. Patient made aware of increased risk of fall and further injury due to  his right shoulder injury. Patient acknowledge. Strongly encouraged patient to consider skilled nursing facility for rehabilitation.  I also did request social worker to help provide pt with pharmacy support to get his medication delivered  to him as pt does not have the fund to get his medication.    Labs Review Labs Reviewed  CBC WITH DIFFERENTIAL - Abnormal; Notable for the following:    WBC 15.0 (*)    HCT 38.8 (*)    Neutrophils Relative % 82 (*)    Neutro Abs 12.2 (*)    All other components within normal limits  I-STAT CHEM 8, ED - Abnormal; Notable for the following:    BUN 24 (*)    Glucose, Bld 164 (*)    Calcium, Ion 1.03 (*)    All other components within normal limits    Imaging Review Dg Shoulder Right  06/07/2014   CLINICAL DATA:  64 year old male status post fall with pain. Initial encounter.  EXAM: RIGHT SHOULDER - 2+ VIEW  COMPARISON:  Portable chest radiograph 05/28/2014.  FINDINGS: Comminuted proximal right humerus fracture affecting the humeral head and neck with impaction and mildly displaced comminution fragments. Glenohumeral joint location appears preserved. The right clavicle and scapula appear intact. Visible right ribs and lung parenchyma appear stable.  IMPRESSION: Comminuted and impacted right humerus head and neck fracture. No associated glenohumeral joint dislocation.   Electronically Signed   By: Augusto Gamble M.D.   On: 06/07/2014 10:52   Dg Elbow Complete Right  06/07/2014   CLINICAL DATA:  64 year old male status post fall with pain. Initial encounter.  EXAM: RIGHT ELBOW - COMPLETE 3+ VIEW  COMPARISON:  Right forearm series 518 2013.  FINDINGS: Lateral views are oblique to despite multiple attempts. Joint spaces and alignment at the right elbow are within normal limits. Radial head appears intact. No acute fracture or dislocation identified. Limited for the evaluation of joint effusion.  IMPRESSION: No acute fracture or dislocation identified about the right  elbow.   Electronically Signed   By: Augusto Gamble M.D.   On: 06/07/2014 10:53     EKG Interpretation None      MDM   Final diagnoses:  Fall  Fracture, humerus, head, right, closed, initial encounter    BP 132/78 mmHg  Pulse 81  Temp(Src) 97.7 F (36.5 C) (Oral)  Resp 19  Wt 145 lb (65.772 kg)  SpO2 99%  I have reviewed nursing notes and vital signs. I personally reviewed the imaging tests through PACS system  I reviewed available ER/hospitalization records thought the EMR     Fayrene Helper, PA-C 06/07/14 1504  Tilden Fossa, MD 06/07/14 [phone]

## 2014-06-07 NOTE — ED Notes (Signed)
[facility]. Pt states stood up and fell today and right shoulder hit tv.

## 2014-06-07 NOTE — ED Notes (Signed)
Grape juice was given to patient. 

## 2014-06-07 NOTE — Progress Notes (Addendum)
CSW received consult for snf placement. Pt states that he does not want to go to snf if at all possible. Pt wants to go home with home health. Pt refused snf placement. Per chart review [facility] RN for safety eval and HH SW. Patient states that he has food waiting for him at home. Patient states he has an aid. CSW to make APS report.     Byrd HesselbachKristen Latysha Thackston, LCSW [phone]  ED CSW 06/07/2014 1323pm

## 2014-06-07 NOTE — Discharge Instructions (Signed)
Humerus Fracture, Treated with Immobilization °The humerus is the large bone in the upper arm. A broken (fractured) humerus is often treated by wearing a cast, splint, or sling (immobilization). This holds the broken pieces in place so they can heal.  °HOME CARE °· Put ice on the injured area. °¨ Put ice in a plastic bag. °¨ Place a towel between your skin and the bag. °¨ Leave the ice on for 15-20 minutes, 03-04 times a day. °· If you are given a cast: °¨ Do not scratch the skin under the cast. °¨ Check the skin around the cast every day. You may put lotion on any red or sore areas. °¨ Keep the cast dry and clean. °· If you are given a splint: °¨ Wear the splint as told. °¨ Keep the splint clean and dry. °¨ Loosen the elastic around the splint if your fingers become numb, cold, tingle, or turn blue. °· If you are given a sling: °¨ Wear the sling as told. °· Do not put pressure on any part of the cast or splint until it is fully hardened. °· The cast or splint must be protected with a plastic bag during bathing. Do not lower the cast or splint into water. °· Only take medicine as told by your doctor. °· Do exercises as told by your doctor. °· Follow up as told by your doctor. °GET HELP RIGHT AWAY IF:  °· Your skin or fingernails turn blue or gray. °· Your arm feels cold or numb. °· You have very bad pain in the injured arm. °· You are having problems with the medicines you were given. °MAKE SURE YOU:  °· Understand these instructions. °· Will watch your condition. °· Will get help right away if you are not doing well or get worse. °Document Released: 10/22/2007 Document Revised: 07/28/2011 Document Reviewed: 06/19/2010 °[employer]® Patient Information ©2015 [employer]. This information is not intended to replace advice given to you by your health care provider. Make sure you discuss any questions you have with your health care provider. ° °

## 2014-06-07 NOTE — ED Notes (Signed)
Bed: [id number] Expected date:  Expected time:  Means of arrival:  Comments: Fall, shoulder pain

## 2014-06-07 NOTE — Consult Note (Signed)
[name] [name] is an 64 y.o. male.    Chief Complaint: right shoulder pain  HPI: 64 y/o male discharged from hospital yesterday states he fell 3 times at home, ground level falls, injuring right shoulder. 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 C/o moderate to severe pain to right shoulder. Denies any numbness or tingling distally. Denies any previous problems with right shoulder in the past. Currently in a sling c/o moderate pain.  PCP:  Gwynneth AlimentSANDERS,ROBYN N, MD  PMH: Past Medical History  Diagnosis Date  . 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 Cough   . Esophageal reflux   . Osteopenia   . Personal history of other diseases of digestive system   . Contact dermatitis and other eczema, due to unspecified cause   . Unspecified sinusitis (chronic)   . Allergic rhinitis   . Unspecified asthma(493.90)   . COPD (chronic obstructive pulmonary disease)   . Hypertension   . Seasonal allergies   . Ulcerative colitis   . H/O hiatal hernia   . Arthritis     spine  . Anxiety   . Multiple open wounds     "from esczema- scratching"  . Multiple facial bone fractures   . 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 History of bronchitis   . Pneumonia     in past had several times  . Shortness of breath     "sometime"  . Chronic lower back pain   . Kidney stones   . Depression     patient constantly hitting left side of face "due to pain"  . Pancreatitis   . Headache     PMH: migraines    PSH: Past Surgical History  Procedure Laterality Date  . Litrotripsy    . Orif distal radius fracture  09/12/11    right  . Fracture surgery    . Metacarpal pinning      right hand  . Patella fracture surgery      right  . Tonsillectomy and adenoidectomy    . Eye surgery      3 Corneal transplant (bilateral eyes)  . Hardware removal  10/09/2011    Procedure: HARDWARE REMOVAL;  Surgeon: Nadara MustardMarcus V Duda, MD;  [facility];  Service: Orthopedics;  Laterality:  Right;  Removal Deep Hardware Right Wrist, placement antibiotic beads.  . Orif wrist fracture Right 06/15/2013    Procedure: RIGHT RADIUS PSTEOTOMY, DISTAL ULNA RESECTION, AND DIGITAL FLEXOR LENGTHENING AS NEEDED;  Surgeon: Marlowe ShoresMatthew A Weingold, MD;  Location: [facility];  Service: Orthopedics;  Laterality: Right;  . Carpal tunnel release Right 06/15/2013    Procedure: RIGHT CARPAL TUNNEL RELEASE;  Surgeon: Marlowe ShoresMatthew A Weingold, MD;  Location:  SURGERY CENTER;  Service: Orthopedics;  Laterality: Right;  . Hernia repair    . Colonoscopy w/ biopsies and polypectomy    . Radiology with anesthesia N/A 03/09/2014    Procedure: MRI - Cervical and Lumbar without Contrast;  Surgeon: Medication Radiologist, MD;  Location: [facility] OR;  Service: Radiology;  Laterality: N/A;    Social History:  reports that he quit smoking about 31 years ago. His smoking use included Cigarettes and Pipe. He has a 3 pack-year smoking history. He has never used smokeless tobacco. He reports that he drinks alcohol. He reports that he uses illicit drugs (Marijuana).  Allergies:  Allergies  Allergen Reactions  . Bacitracin-Polymyxin B Other (See Comments)  Polysporin allergy per opthalmologist.   . Bee Venom Anaphylaxis  . Codeine Other (See Comments)    Makes patient clear throat   . Norco [Hydrocodone-Acetaminophen] Other (See Comments)    causes frontal headaches  . Penicillins Other (See Comments)    Childhood allergy/ makes patient clear throat a lot.  . Tape Hives and Other (See Comments)    "plastic tape"  . Chlorhexidine Itching and Rash    Medications: No current facility-administered medications for this encounter.   Current Outpatient Prescriptions  Medication Sig Dispense Refill  . albuterol (PROVENTIL HFA;VENTOLIN HFA) 108 (90 BASE) MCG/ACT inhaler Inhale 2 puffs into the lungs every 6 (six) hours as needed for wheezing or shortness of breath (wheezing & shortness of breath).     Marland Kitchen  albuterol (PROVENTIL) (2.5 MG/3ML) 0.083% nebulizer solution Take 2.5 mg by nebulization every 4 (four) hours as needed for wheezing or shortness of breath (wheezing & shortness of breath).     . Aspirin-Salicylamide-Caffeine (BC HEADACHE POWDER PO) Take 1 packet by mouth every 4 (four) hours as needed (pain).     . chlorpheniramine-HYDROcodone (TUSSIONEX) 10-8 MG/5ML LQCR Take 5 mLs by mouth every 12 (twelve) hours as needed for cough. 115 mL 0  . desoximetasone (TOPICORT) 0.05 % cream Apply 1 application topically 2 (two) times daily as needed (rash).     . fluticasone (FLONASE) 50 MCG/ACT nasal spray Place 2 sprays into both nostrils daily.    . hydrOXYzine (ATARAX/VISTARIL) 25 MG tablet Take 25 mg by mouth 3 (three) times daily as needed for itching (itching).     Marland Kitchen ibuprofen (ADVIL,MOTRIN) 800 MG tablet Take 1 tablet (800 mg total) by mouth 3 (three) times daily with meals. 21 tablet 0  . mometasone-formoterol (DULERA) 200-5 MCG/ACT AERO Inhale 2 puffs into the lungs 2 (two) times daily. 2 Inhaler 0  . montelukast (SINGULAIR) 10 MG tablet Take 1 tablet (10 mg total) by mouth at bedtime. 30 tablet 11  . omeprazole (PRILOSEC) 40 MG capsule Take 40 mg by mouth 2 (two) times daily.    . Oxycodone HCl 10 MG TABS Take 1 tablet (10 mg total) by mouth every 6 (six) hours as needed (pain). 20 tablet 0  . prednisoLONE acetate (PRED FORTE) 1 % ophthalmic suspension Place 1 drop into both eyes daily.     . sertraline (ZOLOFT) 100 MG tablet Take 200 mg by mouth daily.     Marland Kitchen triamcinolone cream (KENALOG) 0.1 % Apply 1 application topically 2 (two) times daily.  1  . azithromycin (ZITHROMAX) 500 MG tablet Take 1 tablet (500 mg total) by mouth daily. 4 tablet 0  . benzonatate (TESSALON) 100 MG capsule Take 1 capsule (100 mg total) by mouth 3 (three) times daily. 30 capsule 0  . diazepam (VALIUM) 10 MG tablet Take 1 tablet (10 mg total) by mouth 3 (three) times daily. 10 tablet 0  . gabapentin (NEURONTIN) 300  MG capsule Take 1 capsule (300 mg total) by mouth at bedtime. 30 capsule 0  . guaiFENesin (MUCINEX) 600 MG 12 hr tablet Take 1 tablet (600 mg total) by mouth 2 (two) times daily. 30 tablet 0  . predniSONE (DELTASONE) 20 MG tablet Take 3 tablets once daily for 4 days, then take 2 tablets once daily for 4 days, then take 1 tablet once daily till seen by Dr. Delford Field. 30 tablet 0    Results for orders placed or performed during the hospital encounter of 06/07/14 (from the past 48 hour(s))  CBC with Differential     Status: Abnormal   Collection Time: 06/07/14 11:48 AM  Result Value Ref Range   WBC 15.0 (H) 4.0 - 10.5 K/uL   RBC 4.23 4.22 - 5.81 MIL/uL   Hemoglobin 13.0 13.0 - 17.0 g/dL   HCT 29.5 (L) 62.1 - 30.8 %   MCV 91.7 78.0 - 100.0 fL   MCH 30.7 26.0 - 34.0 pg   MCHC 33.5 30.0 - 36.0 g/dL   RDW 65.7 84.6 - 96.2 %   Platelets 214 150 - 400 K/uL   Neutrophils Relative % 82 (H) 43 - 77 %   Neutro Abs 12.2 (H) 1.7 - 7.7 K/uL   Lymphocytes Relative 12 12 - 46 %   Lymphs Abs 1.9 0.7 - 4.0 K/uL   Monocytes Relative 6 3 - 12 %   Monocytes Absolute 0.9 0.1 - 1.0 K/uL   Eosinophils Relative 0 0 - 5 %   Eosinophils Absolute 0.1 0.0 - 0.7 K/uL   Basophils Relative 0 0 - 1 %   Basophils Absolute 0.0 0.0 - 0.1 K/uL  I-stat chem 8, ed     Status: Abnormal   Collection Time: 06/07/14 11:59 AM  Result Value Ref Range   Sodium 137 135 - 145 mmol/L   Potassium 3.5 3.5 - 5.1 mmol/L   Chloride 101 96 - 112 mEq/L   BUN 24 (H) 6 - 23 mg/dL   Creatinine, Ser 9.52 0.50 - 1.35 mg/dL   Glucose, Bld 841 (H) 70 - 99 mg/dL   Calcium, Ion 3.24 (L) 1.13 - 1.30 mmol/L   TCO2 21 0 - 100 mmol/L   Hemoglobin 13.6 13.0 - 17.0 g/dL   HCT 40.1 02.7 - 25.3 %   Dg Shoulder Right  06/07/2014   CLINICAL DATA:  64 year old male status post fall with pain. Initial encounter.  EXAM: RIGHT SHOULDER - 2+ VIEW  COMPARISON:  Portable chest radiograph 05/28/2014.  FINDINGS: Comminuted proximal right humerus fracture  affecting the humeral head and neck with impaction and mildly displaced comminution fragments. Glenohumeral joint location appears preserved. The right clavicle and scapula appear intact. Visible right ribs and lung parenchyma appear stable.  IMPRESSION: Comminuted and impacted right humerus head and neck fracture. No associated glenohumeral joint dislocation.   Electronically Signed   By: Augusto Gamble M.D.   On: 06/07/2014 10:52   Dg Elbow Complete Right  06/07/2014   CLINICAL DATA:  64 year old male status post fall with pain. Initial encounter.  EXAM: RIGHT ELBOW - COMPLETE 3+ VIEW  COMPARISON:  Right forearm series 518 2013.  FINDINGS: Lateral views are oblique to despite multiple attempts. Joint spaces and alignment at the right elbow are within normal limits. Radial head appears intact. No acute fracture or dislocation identified. Limited for the evaluation of joint effusion.  IMPRESSION: No acute fracture or dislocation identified about the right elbow.   Electronically Signed   By: Augusto Gamble M.D.   On: 06/07/2014 10:53    ROS: ROS Legally blind Pain with any rom of right upper extremity  Physical Exam: Alert and appropriate 64 y/o male in no acute distress Obvious decreased vision  Left upper extremity with full rom no tenderness Mild edema with pain with any rom of right upper extremity nv intact distally Currently in a sling Bilateral lower extremities with full rom no tenderness or deformity Physical Exam   X-rays - right proximal humerus fracture with minimal displacement  Assessment/Plan Assessment: right proximal humerus fracture  Plan: Medicine team to admit patient for pain control, monitor pulmonary status and d/c planning. Pt lives alone and is legally blind. Recommend treating the right proximal humerus fracture conservatively with no surgery. Sling at all times and pain control. Pt informed and in agreement.  Will monitor his progress during hospital stay

## 2014-06-07 NOTE — Progress Notes (Signed)
  CARE MANAGEMENT ED NOTE 06/07/2014  Patient:  [name] [name],[name] [name]   Account Number:  [id number]  Date Initiated:  06/07/2014  Documentation initiated by:  Edd ArbourGIBBS,Cissy Galbreath  Subjective/Objective Assessment:   64 yr old [place] 06/06/14 [name]  states stood up and fell today and right shoulder hit tv 06/07/14  admitted with copd exacerbation. PTA lived at home alone. Has aide for 2h/day     Subjective/Objective Assessment Detail:   uses [facility] for transportation   confirmed home number as [phone]  Has a friend as Jefm PettyKirby Jones lives on [address] [place] (2 doors from pt) he can not use pt number CM goggled friend to find -- [phone] listed as his number  Pt states his case worker is a male at [phone]     Action/Plan:   Consulted by ED SW to assist with home health services  1518 spoke with Isabelle CourseKristin H of [facility] to discussed continuation of services and to given pt number and DSS worker number per pt requested   Action/Plan Detail:   Anticipated DC Date:  06/07/2014     Status Recommendation to Physician:   Result of Recommendation:    Other ED Services  Consult Working Plan   In-house referral  Clinical Social Worker   DC Associate [employer]  Other  Outpatient Services - Pt will follow up   [facility]   Choice offered to / List presented to:  C-1 Patient     HH arranged  HH-1 RN  HH-6 SOCIAL WORKER      HH agency  [facility]    Status of service:  Completed, signed off  ED Comments:   ED Comments Detail:

## 2014-06-08 ENCOUNTER — Emergency Department (HOSPITAL_COMMUNITY)
Admission: EM | Admit: 2014-06-08 | Discharge: 2014-06-08 | Disposition: A | Discharge status: Home / Self Care | Payer: Medicaid Other | Attending: Emergency Medicine | Admitting: Emergency Medicine

## 2014-06-08 ENCOUNTER — Emergency Department (HOSPITAL_COMMUNITY): Payer: Medicaid Other

## 2014-06-08 ENCOUNTER — Encounter (HOSPITAL_COMMUNITY): Payer: Self-pay

## 2014-06-08 DIAGNOSIS — N390 Urinary tract infection, site not specified: Secondary | ICD-10-CM | POA: Insufficient documentation

## 2014-06-08 DIAGNOSIS — Z87442 Personal history of urinary calculi: Secondary | ICD-10-CM | POA: Diagnosis not present

## 2014-06-08 DIAGNOSIS — Z79899 Other long term (current) drug therapy: Secondary | ICD-10-CM | POA: Diagnosis not present

## 2014-06-08 DIAGNOSIS — D72829 Elevated white blood cell count, unspecified: Secondary | ICD-10-CM | POA: Diagnosis not present

## 2014-06-08 DIAGNOSIS — Z8701 Personal history of pneumonia (recurrent): Secondary | ICD-10-CM | POA: Diagnosis not present

## 2014-06-08 DIAGNOSIS — Z7951 Long term (current) use of inhaled steroids: Secondary | ICD-10-CM | POA: Diagnosis not present

## 2014-06-08 DIAGNOSIS — M1990 Unspecified osteoarthritis, unspecified site: Secondary | ICD-10-CM | POA: Diagnosis not present

## 2014-06-08 DIAGNOSIS — I10 Essential (primary) hypertension: Secondary | ICD-10-CM | POA: Insufficient documentation

## 2014-06-08 DIAGNOSIS — K219 Gastro-esophageal reflux disease without esophagitis: Secondary | ICD-10-CM | POA: Diagnosis not present

## 2014-06-08 DIAGNOSIS — F329 Major depressive disorder, single episode, unspecified: Secondary | ICD-10-CM | POA: Diagnosis not present

## 2014-06-08 DIAGNOSIS — Z008 Encounter for other general examination: Secondary | ICD-10-CM | POA: Diagnosis present

## 2014-06-08 DIAGNOSIS — Z880 Allergy status to penicillin: Secondary | ICD-10-CM | POA: Insufficient documentation

## 2014-06-08 DIAGNOSIS — H548 Legal blindness, as defined in USA: Secondary | ICD-10-CM | POA: Insufficient documentation

## 2014-06-08 DIAGNOSIS — S42301A Unspecified fracture of shaft of humerus, right arm, initial encounter for closed fracture: Secondary | ICD-10-CM

## 2014-06-08 DIAGNOSIS — G8929 Other chronic pain: Secondary | ICD-10-CM | POA: Insufficient documentation

## 2014-06-08 DIAGNOSIS — Z872 Personal history of diseases of the skin and subcutaneous tissue: Secondary | ICD-10-CM | POA: Diagnosis not present

## 2014-06-08 DIAGNOSIS — F419 Anxiety disorder, unspecified: Secondary | ICD-10-CM | POA: Diagnosis not present

## 2014-06-08 DIAGNOSIS — R000 Tachycardia, unspecified: Secondary | ICD-10-CM | POA: Insufficient documentation

## 2014-06-08 DIAGNOSIS — J449 Chronic obstructive pulmonary disease, unspecified: Secondary | ICD-10-CM | POA: Diagnosis not present

## 2014-06-08 DIAGNOSIS — Z87891 Personal history of nicotine dependence: Secondary | ICD-10-CM | POA: Diagnosis not present

## 2014-06-08 DIAGNOSIS — N3090 Cystitis, unspecified without hematuria: Secondary | ICD-10-CM

## 2014-06-08 LAB — CBC WITH DIFFERENTIAL/PLATELET
BASOS PCT: 0 % (ref 0–1)
Band Neutrophils: 0 % (ref 0–10)
Basophils Absolute: 0 10*3/uL (ref 0.0–0.1)
Blasts: 0 %
EOS PCT: 0 % (ref 0–5)
Eosinophils Absolute: 0 10*3/uL (ref 0.0–0.7)
HEMATOCRIT: 44.5 % (ref 39.0–52.0)
Hemoglobin: 15.5 g/dL (ref 13.0–17.0)
LYMPHS ABS: 1.5 10*3/uL (ref 0.7–4.0)
LYMPHS PCT: 8 % — AB (ref 12–46)
MCH: 31.7 pg (ref 26.0–34.0)
MCHC: 34.8 g/dL (ref 30.0–36.0)
MCV: 91 fL (ref 78.0–100.0)
MYELOCYTES: 0 %
Metamyelocytes Relative: 0 %
Monocytes Absolute: 1.3 10*3/uL — ABNORMAL HIGH (ref 0.1–1.0)
Monocytes Relative: 7 % (ref 3–12)
NEUTROS PCT: 85 % — AB (ref 43–77)
NRBC: 0 /100{WBCs}
Neutro Abs: 16.3 10*3/uL — ABNORMAL HIGH (ref 1.7–7.7)
Platelets: 213 10*3/uL (ref 150–400)
Promyelocytes Absolute: 0 %
RBC: 4.89 MIL/uL (ref 4.22–5.81)
RDW: 13.7 % (ref 11.5–15.5)
WBC: 19.1 10*3/uL — ABNORMAL HIGH (ref 4.0–10.5)

## 2014-06-08 LAB — BASIC METABOLIC PANEL
Anion gap: 10 (ref 5–15)
BUN: 21 mg/dL (ref 6–23)
CO2: 24 mmol/L (ref 19–32)
Calcium: 8.8 mg/dL (ref 8.4–10.5)
Chloride: 104 mEq/L (ref 96–112)
Creatinine, Ser: 1.01 mg/dL (ref 0.50–1.35)
GFR calc Af Amer: 89 mL/min — ABNORMAL LOW (ref >90)
GFR calc non Af Amer: 77 mL/min — ABNORMAL LOW (ref >90)
GLUCOSE: 83 mg/dL (ref 70–99)
POTASSIUM: 4 mmol/L (ref 3.5–5.1)
Sodium: 138 mmol/L (ref 135–145)

## 2014-06-08 LAB — URINE MICROSCOPIC-ADD ON

## 2014-06-08 LAB — URINALYSIS, ROUTINE W REFLEX MICROSCOPIC
BILIRUBIN URINE: NEGATIVE
GLUCOSE, UA: NEGATIVE mg/dL
Hgb urine dipstick: NEGATIVE
Ketones, ur: NEGATIVE mg/dL
NITRITE: POSITIVE — AB
Protein, ur: NEGATIVE mg/dL
SPECIFIC GRAVITY, URINE: 1.024 (ref 1.005–1.030)
Urobilinogen, UA: 1 mg/dL (ref 0.0–1.0)
pH: 6.5 (ref 5.0–8.0)

## 2014-06-08 MED ORDER — DEXTROSE 5 % IV SOLN
1.0000 g | Freq: Once | INTRAVENOUS | Status: AC
  Administered 2014-06-08: 1 g via INTRAVENOUS
  Filled 2014-06-08: qty 10

## 2014-06-08 MED ORDER — OXYCODONE HCL 5 MG PO TABS
10.0000 mg | ORAL_TABLET | Freq: Once | ORAL | Status: AC
  Administered 2014-06-08: 10 mg via ORAL
  Filled 2014-06-08: qty 2

## 2014-06-08 MED ORDER — IPRATROPIUM-ALBUTEROL 0.5-2.5 (3) MG/3ML IN SOLN
3.0000 mL | Freq: Once | RESPIRATORY_TRACT | Status: AC
  Administered 2014-06-08: 3 mL via RESPIRATORY_TRACT
  Filled 2014-06-08: qty 3

## 2014-06-08 MED ORDER — OXYCODONE HCL 10 MG PO TABS: 5.0000 mg | ORAL_TABLET | Freq: Four times a day (QID) | ORAL | Status: DC | PRN

## 2014-06-08 MED ORDER — CEPHALEXIN 500 MG PO CAPS: 500.0000 mg | ORAL_CAPSULE | Freq: Two times a day (BID) | ORAL | Status: DC

## 2014-06-08 NOTE — ED Notes (Signed)
Patient transported to X-ray 

## 2014-06-08 NOTE — ED Notes (Signed)
Grape juice given  

## 2014-06-08 NOTE — ED Notes (Signed)
Bed: [id number] Expected date: 06/08/14 Expected time: 9:07 AM Means of arrival: Ambulance Comments: 64 y/o M cough

## 2014-06-08 NOTE — Progress Notes (Addendum)
1547 Provided pt with number for  [employer] [facility] [address] [place] [address] [phone] In large print assisted with dialing number for pt  1536 Dr Mahala MenghiniSamtani present doing hospital evaluation on pt.  Inquired about SNF placement.  Cm spoke with ED SW who states pt has [facility] pending FL2 completion with EDP.  Dr Mahala MenghiniSamtani requested Cm informed pt of  Snf. Spoke with pt who states he is willing to go to snf but has a concern about his home phone with [employer] Reports he is unable to call in or receive calls out because of his phone line.  Reports fall at home with "tearing my house up"  Cm offered to assist pt with contact information for [employer] to attempt to resolve the issue.  Spoke with pt He states after last d/c not seen by pcp, no pcp f/u calls nor visits from home health.

## 2014-06-08 NOTE — ED Notes (Signed)
Pt asked for pain medication, RN notified.

## 2014-06-08 NOTE — ED Notes (Signed)
MD at bedside. 

## 2014-06-08 NOTE — Progress Notes (Signed)
CSW spoke with pt at bedside regarding current situation. Previouslly patient had declined snf placement. Pt now requesting to go to skilled nursing facility for assistance. Pt reports he now can not see at all and does not feel safe.  CSW and pt discussed process and requirements related to medicaid and SSI check. Patient agreeable. Pt requested assistance with the phone to call DSS and pt eye dr. Patient mentioned that his right eye had very limited vision however now he has complete vision loss. CSW informed RN who will discuss with EDP. Patient states that [facility] and was waiting for surgery for a cornea transplant.   CSW starting snf placement process. [place] Patient to be placed in snf later today.   Byrd HesselbachKristen Carron Jaggi, LCSW [phone]  ED CSW 06/08/2014  11:07 AM

## 2014-06-08 NOTE — ED Notes (Signed)
Pt belonging left behind in room after [facility] left with pt. Placed in locker 33. [facility], Doylene CanardKoisay Morlu, RN at [facility] aware of need to get pt's belongings.

## 2014-06-08 NOTE — ED Notes (Signed)
Hospitalist at bedside 

## 2014-06-08 NOTE — ED Notes (Signed)
Per EMS, Pt, from home, presents needing a social work consult.  Pt has recently been admitted for COPD exacerbation and was seen yesterday for a fall and fractured humerus.  Pt has been unable to get prescriptions filled that were provided at discharge on 1/19.  Pt reports that Home health care was coordinated prior to d/c yesterday, but no one arrived last night or this morning.  EMS reports Pt is living in "deploarable conditions."  Pt is urinating in a pickle jar and has not eaten since d/c yesterday.  Roaches and bed noted as well.  Pt is legally blind and lives alone.

## 2014-06-08 NOTE — ED Notes (Signed)
Patient transported to CT 
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
## 2014-06-08 NOTE — ED Notes (Signed)
Social work made aware of Pt and situation.  She is en route to speak w/ Pt.

## 2014-06-08 NOTE — Discharge Instructions (Signed)
Follow up with your pulmonogist as originally planned.  Follow up with your Orthopedist.  Continue current medications as prescribed from hospital discharge on 06/06/14.  Urinary Tract Infection Urinary tract infections (UTIs) can develop anywhere along your urinary tract. Your urinary tract is your body's drainage system for removing wastes and extra water. Your urinary tract includes two kidneys, two ureters, a bladder, and a urethra. Your kidneys are a pair of bean-shaped organs. Each kidney is about the size of your fist. They are located below your ribs, one on each side of your spine. CAUSES Infections are caused by microbes, which are microscopic organisms, including fungi, viruses, and bacteria. These organisms are so small that they can only be seen through a microscope. Bacteria are the microbes that most commonly cause UTIs. SYMPTOMS  Symptoms of UTIs may vary by age and gender of the patient and by the location of the infection. Symptoms in young women typically include a frequent and intense urge to urinate and a painful, burning feeling in the bladder or urethra during urination. Older women and men are more likely to be tired, shaky, and weak and have muscle aches and abdominal pain. A fever may mean the infection is in your kidneys. Other symptoms of a kidney infection include pain in your back or sides below the ribs, nausea, and vomiting. DIAGNOSIS To diagnose a UTI, your caregiver will ask you about your symptoms. Your caregiver also will ask to provide a urine sample. The urine sample will be tested for bacteria and white blood cells. White blood cells are made by your body to help fight infection. TREATMENT  Typically, UTIs can be treated with medication. Because most UTIs are caused by a bacterial infection, they usually can be treated with the use of antibiotics. The choice of antibiotic and length of treatment depend on your symptoms and the type of bacteria causing your  infection. HOME CARE INSTRUCTIONS  If you were prescribed antibiotics, take them exactly as your caregiver instructs you. Finish the medication even if you feel better after you have only taken some of the medication.  Drink enough water and fluids to keep your urine clear or pale yellow.  Avoid caffeine, tea, and carbonated beverages. They tend to irritate your bladder.  Empty your bladder often. Avoid holding urine for long periods of time.  Empty your bladder before and after sexual intercourse.  After a bowel movement, women should cleanse from front to back. Use each tissue only once. SEEK MEDICAL CARE IF:   You have back pain.  You develop a fever.  Your symptoms do not begin to resolve within 3 days. SEEK IMMEDIATE MEDICAL CARE IF:   You have severe back pain or lower abdominal pain.  You develop chills.  You have nausea or vomiting.  You have continued burning or discomfort with urination. MAKE SURE YOU:   Understand these instructions.  Will watch your condition.  Will get help right away if you are not doing well or get worse. Document Released: 02/12/2005 Document Revised: 11/04/2011 Document Reviewed: 06/13/2011 [facility] Patient Information 2015 [place]. This information is not intended to replace advice given to you by your health care provider. Make sure you discuss any questions you have with your health care provider.

## 2014-06-08 NOTE — ED Notes (Signed)
Pt is now reporting that he had vision changes after leaving [facility] yesterday.  Sts "I could see features, but now everything looks like wax paper."  [facility] MD notified.
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
## 2014-06-08 NOTE — ED Notes (Signed)
Pt belongings found in room after [facility] Belongings placed in [address] RN, Koisay Morlu notified and verbalized understanding of need to pick up pt's belongings.

## 2014-06-08 NOTE — Progress Notes (Signed)
[facility] to ensure that facility received all the necessary documents in order to be admitted into the facility. Per Tammy, all the documents were received including the medication list, FL2, and discharge summary. [facility]  CSW consulted with physician who states she updated the patient medication list. Also, CSW consulted with nurse who states that she called [facility], who will transport patient to the facility.  Trish MageBrittney Ayushi Pla, LCSWA [phone] ED CSW 06/08/2014 11:38 PM

## 2014-06-08 NOTE — ED Notes (Signed)
Respiratory at bedside.

## 2014-06-08 NOTE — ED Notes (Signed)
Pt requesting to walk to the bathroom as he does at home. Pt walked to bathroom, sat on toilet and given string to pull when finished. Pt verbalized understanding to pull when he needs assistance back to his bed.

## 2014-06-08 NOTE — ED Provider Notes (Signed)
CSN: [id number]     Arrival date & time 06/08/14  [id number] History   First MD Initiated Contact with Patient 06/08/14 0930     Chief Complaint  Patient presents with  . Social Work consult      The history is provided by the patient. No language interpreter was used.   [name] presents for evaluation of getting assistance at home. He has a history of COPD and was recently admitted for a COPD exacerbation. After his discharge he had multiple falls next day at home and one of those falls he fractured his proximal humerus. He was seen in the emergency department yesterday for this and was placed in a sling and offered discharge to skilled nursing facility versus discharge home with home health assistance. She did not want to go to skilled nursing facility at that time decided to go home. Since going home he has not received any assistance at home. He was left on his couch and he has been unable to care for himself at home. Since his falls his furniture has been rearranged and tumbled over and he is unable to get up and around his home. He has not had any medications or eaten since yesterday. He reports pain in his right arm for his humerus fracture is as well as shortness of breath and cough. He denies any fevers, leg swelling or pain. Symptoms are moderate and constant.  Past Medical History  Diagnosis Date  . 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 Cough   . Esophageal reflux   . Osteopenia   . Personal history of other diseases of digestive system   . Contact dermatitis and other eczema, due to unspecified cause   . Unspecified sinusitis (chronic)   . Allergic rhinitis   . Unspecified asthma(493.90)   . COPD (chronic obstructive pulmonary disease)   . Hypertension   . Seasonal allergies   . Ulcerative colitis   . H/O hiatal hernia   . Arthritis     spine  . Anxiety   . Multiple open wounds     "from esczema- scratching"  . Multiple facial bone fractures   .  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 History of bronchitis   . Pneumonia     in past had several times  . Shortness of breath     "sometime"  . Chronic lower back pain   . Kidney stones   . Depression     patient constantly hitting left side of face "due to pain"  . Pancreatitis   . Headache     PMH: migraines   Past Surgical History  Procedure Laterality Date  . Litrotripsy    . Orif distal radius fracture  09/12/11    right  . Fracture surgery    . Metacarpal pinning      right hand  . Patella fracture surgery      right  . Tonsillectomy and adenoidectomy    . Eye surgery      3 Corneal transplant (bilateral eyes)  . Hardware removal  10/09/2011    Procedure: HARDWARE REMOVAL;  Surgeon: Nadara MustardMarcus V Duda, MD;  Location[facility];  Service: Orthopedics;  Laterality: Right;  Removal Deep Hardware Right Wrist, placement antibiotic beads.  . Orif wrist fracture Right 06/15/2013    Procedure: RIGHT RADIUS PSTEOTOMY, DISTAL ULNA RESECTION, AND DIGITAL FLEXOR LENGTHENING AS NEEDED;  Surgeon: Marlowe ShoresMatthew A Weingold,  MD;  Location: [facility];  Service: Orthopedics;  Laterality: Right;  . Carpal tunnel release Right 06/15/2013    Procedure: RIGHT CARPAL TUNNEL RELEASE;  Surgeon: Marlowe Shores, MD;  Location: [facility];  Service: Orthopedics;  Laterality: Right;  . Hernia repair    . Colonoscopy w/ biopsies and polypectomy    . Radiology with anesthesia N/A 03/09/2014    Procedure: MRI - Cervical and Lumbar without Contrast;  Surgeon: Medication Radiologist, MD;  Location: [facility] OR;  Service: Radiology;  Laterality: N/A;   Family History  Problem Relation Age of Onset  . Allergies Mother     atopic  . Asthma Mother   . Heart failure Mother     CHF  . Other Father   . Anesthesia problems Neg Hx    History  Substance Use Topics  . Smoking status: Former Smoker -- 1.00 packs/day for 3 years    Types: Cigarettes, Pipe    Quit date: 05/20/1983   . Smokeless tobacco: Never Used  . Alcohol Use: Yes     Comment: "scotch once a year"    Review of Systems  All other systems reviewed and are negative.     Allergies  Bacitracin-polymyxin b; Bee venom; Codeine; Norco; Penicillins; Tape; and Chlorhexidine  Home Medications   Prior to Admission medications   Medication Sig Start Date End Date Taking? Authorizing Provider  albuterol (PROVENTIL HFA;VENTOLIN HFA) 108 (90 BASE) MCG/ACT inhaler Inhale 2 puffs into the lungs every 6 (six) hours as needed for wheezing or shortness of breath (wheezing & shortness of breath).     Historical Provider, MD  albuterol (PROVENTIL) (2.5 MG/3ML) 0.083% nebulizer solution Take 2.5 mg by nebulization every 4 (four) hours as needed for wheezing or shortness of breath (wheezing & shortness of breath).     Historical Provider, MD  Aspirin-Salicylamide-Caffeine (BC HEADACHE POWDER PO) Take 1 packet by mouth every 4 (four) hours as needed (pain).     Historical Provider, MD  azithromycin (ZITHROMAX) 500 MG tablet Take 1 tablet (500 mg total) by mouth daily. 06/06/14   Osvaldo Shipper, MD  benzonatate (TESSALON) 100 MG capsule Take 1 capsule (100 mg total) by mouth 3 (three) times daily. 06/06/14   Osvaldo Shipper, MD  chlorpheniramine-HYDROcodone (TUSSIONEX) 10-8 MG/5ML LQCR Take 5 mLs by mouth every 12 (twelve) hours as needed for cough. 06/06/14   Osvaldo Shipper, MD  desoximetasone (TOPICORT) 0.05 % cream Apply 1 application topically 2 (two) times daily as needed (rash).     Historical Provider, MD  diazepam (VALIUM) 10 MG tablet Take 1 tablet (10 mg total) by mouth 3 (three) times daily. 06/06/14   Osvaldo Shipper, MD  fluticasone (FLONASE) 50 MCG/ACT nasal spray Place 2 sprays into both nostrils daily.    Historical Provider, MD  gabapentin (NEURONTIN) 300 MG capsule Take 1 capsule (300 mg total) by mouth at bedtime. 06/06/14   Osvaldo Shipper, MD  guaiFENesin (MUCINEX) 600 MG 12 hr tablet Take 1 tablet (600 mg  total) by mouth 2 (two) times daily. 06/06/14   Osvaldo Shipper, MD  hydrOXYzine (ATARAX/VISTARIL) 25 MG tablet Take 25 mg by mouth 3 (three) times daily as needed for itching (itching).     Historical Provider, MD  ibuprofen (ADVIL,MOTRIN) 800 MG tablet Take 1 tablet (800 mg total) by mouth 3 (three) times daily with meals. 01/04/14   Lauren Parker, PA-C  mometasone-formoterol (DULERA) 200-5 MCG/ACT AERO Inhale 2 puffs into the lungs 2 (two) times daily.  05/17/14   Storm Frisk, MD  montelukast (SINGULAIR) 10 MG tablet Take 1 tablet (10 mg total) by mouth at bedtime. 05/17/14   Storm Frisk, MD  omeprazole (PRILOSEC) 40 MG capsule Take 40 mg by mouth 2 (two) times daily.    Historical Provider, MD  Oxycodone HCl 10 MG TABS Take 1 tablet (10 mg total) by mouth every 6 (six) hours as needed (pain). 06/07/14   Fayrene Helper, PA-C  prednisoLONE acetate (PRED FORTE) 1 % ophthalmic suspension Place 1 drop into both eyes daily.     Historical Provider, MD  predniSONE (DELTASONE) 20 MG tablet Take 3 tablets once daily for 4 days, then take 2 tablets once daily for 4 days, then take 1 tablet once daily till seen by Dr. Delford Field. 06/06/14   Osvaldo Shipper, MD  sertraline (ZOLOFT) 100 MG tablet Take 200 mg by mouth daily.     Historical Provider, MD  triamcinolone cream (KENALOG) 0.1 % Apply 1 application topically 2 (two) times daily. 03/03/14   Historical Provider, MD   BP 138/100 mmHg  Pulse 101  Temp(Src) 98 F (36.7 C) (Oral)  Resp 24  SpO2 98% Physical Exam  Constitutional: He is oriented to person, place, and time. He appears well-developed and well-nourished.  HENT:  Head: Normocephalic and atraumatic.  Cardiovascular: Regular rhythm.   No murmur heard. Tachycardic  Pulmonary/Chest:  Tachypneic with rhonchi bilaterally, greatest in the left lung base. Speaks in 3 word sentences.  Abdominal: Soft. There is no tenderness. There is no rebound and no guarding.  Musculoskeletal: He exhibits no  edema.  RUE in sling  Neurological: He is alert and oriented to person, place, and time.  Skin: Skin is warm and dry.  Psychiatric: He has a normal mood and affect. His behavior is normal.  Nursing note and vitals reviewed.   ED Course  Procedures (including critical care time) Labs Review Labs Reviewed  BASIC METABOLIC PANEL  CBC WITH DIFFERENTIAL    Imaging Review Dg Shoulder Right  06/07/2014   CLINICAL DATA:  64 year old male status post fall with pain. Initial encounter.  EXAM: RIGHT SHOULDER - 2+ VIEW  COMPARISON:  Portable chest radiograph 05/28/2014.  FINDINGS: Comminuted proximal right humerus fracture affecting the humeral head and neck with impaction and mildly displaced comminution fragments. Glenohumeral joint location appears preserved. The right clavicle and scapula appear intact. Visible right ribs and lung parenchyma appear stable.  IMPRESSION: Comminuted and impacted right humerus head and neck fracture. No associated glenohumeral joint dislocation.   Electronically Signed   By: Augusto Gamble M.D.   On: 06/07/2014 10:52   Dg Elbow Complete Right  06/07/2014   CLINICAL DATA:  64 year old male status post fall with pain. Initial encounter.  EXAM: RIGHT ELBOW - COMPLETE 3+ VIEW  COMPARISON:  Right forearm series 518 2013.  FINDINGS: Lateral views are oblique to despite multiple attempts. Joint spaces and alignment at the right elbow are within normal limits. Radial head appears intact. No acute fracture or dislocation identified. Limited for the evaluation of joint effusion.  IMPRESSION: No acute fracture or dislocation identified about the right elbow.   Electronically Signed   By: Augusto Gamble M.D.   On: 06/07/2014 10:53     EKG Interpretation None      MDM   Final diagnoses:  Acute UTI  Leukocytosis    Patient with history of COPD and blindness here for evaluation after he was unable to care for himself at home. He was  seen in the emergency department yesterday for a  fall and was diagnosed with a right proximal humerus fracture. Patient is unable to give himself medications at home, on initial emergency Department evaluation patient with tachypnea and wheezes and rhonchi, symptoms did improve after one nebulizer treatment. Labs and chest x-ray performed given worsening status. CBC with leukocytosis. Chest x-ray without evidence of acute pneumonia. UA is consistent with acute UTI. Patient's breathing improved after one treatment and patient had been without his nebulizer treatments since last night, acute COPD exacerbation is not likely at this time. Discussed with medicine regarding admission for UTI and assistance with placement in skilled nursing facility. Hospitalist, Dr. Suzzanne Cloud has seen the patient, feels stable to discharge to skilled nursing facility with treatment for urinary tract infection. Social work is working with placement for nursing facility. Started on Keflex for urinary tract infection and cultures are pending.  Tilden Fossa, MD 06/09/14 [id number]

## 2014-06-11 ENCOUNTER — Emergency Department (HOSPITAL_COMMUNITY)
Admission: EM | Admit: 2014-06-11 | Discharge: 2014-06-12 | Disposition: A | Discharge status: Home / Self Care | Payer: Medicaid Other | Attending: Emergency Medicine | Admitting: Emergency Medicine

## 2014-06-11 ENCOUNTER — Encounter (HOSPITAL_COMMUNITY): Payer: Self-pay | Admitting: Emergency Medicine

## 2014-06-11 DIAGNOSIS — Z7951 Long term (current) use of inhaled steroids: Secondary | ICD-10-CM | POA: Diagnosis not present

## 2014-06-11 DIAGNOSIS — K219 Gastro-esophageal reflux disease without esophagitis: Secondary | ICD-10-CM | POA: Diagnosis not present

## 2014-06-11 DIAGNOSIS — Z792 Long term (current) use of antibiotics: Secondary | ICD-10-CM | POA: Diagnosis not present

## 2014-06-11 DIAGNOSIS — F419 Anxiety disorder, unspecified: Secondary | ICD-10-CM | POA: Diagnosis not present

## 2014-06-11 DIAGNOSIS — Z8619 Personal history of other infectious and parasitic diseases: Secondary | ICD-10-CM | POA: Insufficient documentation

## 2014-06-11 DIAGNOSIS — Z87891 Personal history of nicotine dependence: Secondary | ICD-10-CM | POA: Diagnosis not present

## 2014-06-11 DIAGNOSIS — Z8701 Personal history of pneumonia (recurrent): Secondary | ICD-10-CM | POA: Diagnosis not present

## 2014-06-11 DIAGNOSIS — S42411D Displaced simple supracondylar fracture without intercondylar fracture of right humerus, subsequent encounter for fracture with routine healing: Secondary | ICD-10-CM | POA: Insufficient documentation

## 2014-06-11 DIAGNOSIS — Z87442 Personal history of urinary calculi: Secondary | ICD-10-CM | POA: Insufficient documentation

## 2014-06-11 DIAGNOSIS — H548 Legal blindness, as defined in USA: Secondary | ICD-10-CM | POA: Diagnosis not present

## 2014-06-11 DIAGNOSIS — Z872 Personal history of diseases of the skin and subcutaneous tissue: Secondary | ICD-10-CM | POA: Diagnosis not present

## 2014-06-11 DIAGNOSIS — I10 Essential (primary) hypertension: Secondary | ICD-10-CM | POA: Insufficient documentation

## 2014-06-11 DIAGNOSIS — Z79899 Other long term (current) drug therapy: Secondary | ICD-10-CM | POA: Insufficient documentation

## 2014-06-11 DIAGNOSIS — J449 Chronic obstructive pulmonary disease, unspecified: Secondary | ICD-10-CM | POA: Diagnosis not present

## 2014-06-11 DIAGNOSIS — G8929 Other chronic pain: Secondary | ICD-10-CM | POA: Diagnosis not present

## 2014-06-11 DIAGNOSIS — S4991XD Unspecified injury of right shoulder and upper arm, subsequent encounter: Secondary | ICD-10-CM | POA: Diagnosis present

## 2014-06-11 DIAGNOSIS — X58XXXD Exposure to other specified factors, subsequent encounter: Secondary | ICD-10-CM | POA: Diagnosis not present

## 2014-06-11 DIAGNOSIS — M1990 Unspecified osteoarthritis, unspecified site: Secondary | ICD-10-CM | POA: Insufficient documentation

## 2014-06-11 DIAGNOSIS — Z880 Allergy status to penicillin: Secondary | ICD-10-CM | POA: Insufficient documentation

## 2014-06-11 LAB — URINE CULTURE: Colony Count: >=100000

## 2014-06-11 MED ORDER — MOMETASONE FURO-FORMOTEROL FUM 200-5 MCG/ACT IN AERO: 2.0000 | INHALATION_SPRAY | Freq: Two times a day (BID) | RESPIRATORY_TRACT | Status: DC

## 2014-06-11 MED ORDER — MONTELUKAST SODIUM 10 MG PO TABS
10.0000 mg | ORAL_TABLET | Freq: Every day | ORAL | Status: DC
Start: 2014-06-11 — End: 2014-08-09

## 2014-06-11 MED ORDER — HYDROXYZINE HCL 25 MG PO TABS: 25.0000 mg | ORAL_TABLET | Freq: Three times a day (TID) | ORAL | Status: DC | PRN

## 2014-06-11 MED ORDER — GABAPENTIN 300 MG PO CAPS: 300.0000 mg | ORAL_CAPSULE | Freq: Every day | ORAL | Status: DC

## 2014-06-11 MED ORDER — SERTRALINE HCL 100 MG PO TABS: 200.0000 mg | ORAL_TABLET | Freq: Every day | ORAL | Status: DC

## 2014-06-11 MED ORDER — OMEPRAZOLE 40 MG PO CPDR: 40.0000 mg | DELAYED_RELEASE_CAPSULE | Freq: Two times a day (BID) | ORAL | Status: DC

## 2014-06-11 MED ORDER — ALBUTEROL SULFATE HFA 108 (90 BASE) MCG/ACT IN AERS: 2.0000 | INHALATION_SPRAY | Freq: Four times a day (QID) | RESPIRATORY_TRACT | Status: DC | PRN

## 2014-06-11 NOTE — ED Notes (Signed)
[facility] contacted for pt transport. 

## 2014-06-11 NOTE — ED Provider Notes (Signed)
CSN: [id number]     Arrival date & time 06/11/14  2119 History   First MD Initiated Contact with Patient 06/11/14 2145     Chief Complaint  Patient presents with  . Shoulder Injury      HPI Patient presents to the emergency department complaining that he does not like his rehabilitation facility.  He had a recent right proximal humerus fracture for which he was initially treated as an outpatient but was not doing well at home and therefore case management and social work were involved to the patient is recently been placed to the rehabilitation facility.  He presents back today complaining of not liking the rehabilitation facility would like to go home.  He states he does not want to go back to the rehabilitation facility in the would prefer to just go home with home health resources.  He has no other complaints.  He requests that we call the case management team so they can assist him in obtaining home health resources tomorrow.  He has a key to his house and would like to go to his house now.   Past Medical History  Diagnosis Date  . 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 Cough   . Esophageal reflux   . Osteopenia   . Personal history of other diseases of digestive system   . Contact dermatitis and other eczema, due to unspecified cause   . Unspecified sinusitis (chronic)   . Allergic rhinitis   . Unspecified asthma(493.90)   . COPD (chronic obstructive pulmonary disease)   . Hypertension   . Seasonal allergies   . Ulcerative colitis   . H/O hiatal hernia   . Arthritis     spine  . Anxiety   . Multiple open wounds     "from esczema- scratching"  . Multiple facial bone fractures   . 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 History of bronchitis   . Pneumonia     in past had several times  . Shortness of breath     "sometime"  . Chronic lower back pain   . Kidney stones   . Depression     patient constantly hitting left side of face "due  to pain"  . Pancreatitis   . Headache     PMH: migraines   Past Surgical History  Procedure Laterality Date  . Litrotripsy    . Orif distal radius fracture  09/12/11    right  . Fracture surgery    . Metacarpal pinning      right hand  . Patella fracture surgery      right  . Tonsillectomy and adenoidectomy    . Eye surgery      3 Corneal transplant (bilateral eyes)  . Hardware removal  10/09/2011    Procedure: HARDWARE REMOVAL;  Surgeon: Nadara Mustard, MD;  Location: [facility] OR;  Service: Orthopedics;  Laterality: Right;  Removal Deep Hardware Right Wrist, placement antibiotic beads.  . Orif wrist fracture Right 06/15/2013    Procedure: RIGHT RADIUS PSTEOTOMY, DISTAL ULNA RESECTION, AND DIGITAL FLEXOR LENGTHENING AS NEEDED;  Surgeon: Marlowe Shores, MD;  Location: [facility];  Service: Orthopedics;  Laterality: Right;  . Carpal tunnel release Right 06/15/2013    Procedure: RIGHT CARPAL TUNNEL RELEASE;  Surgeon: Marlowe Shores, MD;  Location: [facility];  Service: Orthopedics;  Laterality: Right;  . Hernia repair    .  Colonoscopy w/ biopsies and polypectomy    . Radiology with anesthesia N/A 03/09/2014    Procedure: MRI - Cervical and Lumbar without Contrast;  Surgeon: Medication Radiologist, MD;  Location: [facility] OR;  Service: Radiology;  Laterality: N/A;   Family History  Problem Relation Age of Onset  . Allergies Mother     atopic  . Asthma Mother   . Heart failure Mother     CHF  . Other Father   . Anesthesia problems Neg Hx    History  Substance Use Topics  . Smoking status: Former Smoker -- 1.00 packs/day for 3 years    Types: Cigarettes, Pipe    Quit date: 05/20/1983  . Smokeless tobacco: Never Used  . Alcohol Use: Yes     Comment: "scotch once a year"    Review of Systems  All other systems reviewed and are negative.     Allergies  Bacitracin-polymyxin b; Bee venom; Codeine; Norco; Penicillins; Tape; and Chlorhexidine  Home  Medications   Prior to Admission medications   Medication Sig Start Date End Date Taking? Authorizing Provider  albuterol (PROVENTIL HFA;VENTOLIN HFA) 108 (90 BASE) MCG/ACT inhaler Inhale 2 puffs into the lungs every 6 (six) hours as needed for wheezing or shortness of breath (wheezing & shortness of breath).     Historical Provider, MD  albuterol (PROVENTIL) (2.5 MG/3ML) 0.083% nebulizer solution Take 2.5 mg by nebulization every 4 (four) hours as needed for wheezing or shortness of breath (wheezing & shortness of breath).     Historical Provider, MD  azithromycin (ZITHROMAX) 500 MG tablet Take 1 tablet (500 mg total) by mouth daily. 06/06/14   Osvaldo ShipperGokul Krishnan, MD  cephALEXin (KEFLEX) 500 MG capsule Take 1 capsule (500 mg total) by mouth 2 (two) times daily. 06/08/14   Tilden FossaElizabeth Rees, MD  diazepam (VALIUM) 10 MG tablet Take 1 tablet (10 mg total) by mouth 3 (three) times daily. 06/06/14   Osvaldo ShipperGokul Krishnan, MD  fluticasone (FLONASE) 50 MCG/ACT nasal spray Place 2 sprays into both nostrils daily.    Historical Provider, MD  gabapentin (NEURONTIN) 300 MG capsule Take 1 capsule (300 mg total) by mouth at bedtime. 06/06/14   Osvaldo ShipperGokul Krishnan, MD  guaiFENesin (MUCINEX) 600 MG 12 hr tablet Take 1 tablet (600 mg total) by mouth 2 (two) times daily. 06/06/14   Osvaldo ShipperGokul Krishnan, MD  hydrOXYzine (ATARAX/VISTARIL) 25 MG tablet Take 25 mg by mouth 3 (three) times daily as needed for itching (itching).     Historical Provider, MD  mometasone-formoterol (DULERA) 200-5 MCG/ACT AERO Inhale 2 puffs into the lungs 2 (two) times daily. 05/17/14   Storm FriskPatrick E Wright, MD  montelukast (SINGULAIR) 10 MG tablet Take 1 tablet (10 mg total) by mouth at bedtime. 05/17/14   Storm FriskPatrick E Wright, MD  omeprazole (PRILOSEC) 40 MG capsule Take 40 mg by mouth 2 (two) times daily.    Historical Provider, MD  Oxycodone HCl 10 MG TABS Take 1 tablet (10 mg total) by mouth every 6 (six) hours as needed (pain). 06/07/14   Fayrene HelperBowie Tran, PA-C  Oxycodone HCl 10  MG TABS Take 0.5 tablets (5 mg total) by mouth every 6 (six) hours as needed for severe pain. 06/08/14   Tilden FossaElizabeth Rees, MD  prednisoLONE acetate (PRED FORTE) 1 % ophthalmic suspension Place 1 drop into both eyes daily.     Historical Provider, MD  predniSONE (DELTASONE) 20 MG tablet Take 3 tablets once daily for 4 days, then take 2 tablets once daily for 4 days, then  take 1 tablet once daily till seen by Dr. Delford Field. 06/06/14   Osvaldo Shipper, MD  sertraline (ZOLOFT) 100 MG tablet Take 200 mg by mouth daily.     Historical Provider, MD   BP 105/80 mmHg  Pulse 92  Temp(Src) 98.7 F (37.1 C) (Oral)  Resp 18  SpO2 99% Physical Exam  Constitutional: He is oriented to person, place, and time. He appears well-developed and well-nourished.  HENT:  Head: Normocephalic.  Eyes: EOM are normal.  Neck: Normal range of motion.  Pulmonary/Chest: Effort normal.  Abdominal: He exhibits no distension.  Musculoskeletal: Normal range of motion.  Right upper extremity in a sling.  Normal perfusion in his right hand  Neurological: He is alert and oriented to person, place, and time.  Psychiatric: He has a normal mood and affect.  Nursing note and vitals reviewed.   ED Course  Procedures (including critical care time) Labs Review Labs Reviewed - No data to display  Imaging Review No results found.   EKG Interpretation None      MDM   Final diagnoses:  Right supracondylar humerus fracture, with routine healing, subsequent encounter    Patient will be discharged home at this time.  He is blind.  He'll be taken there by [facility].  I have left a message for our case management team to contact the patient the morning for home health resources.  I've asked the patient return to the ER for any new or worsening symptoms.  He has an orthopedic surgeon to follow-up with our right and he will schedule this.  He is requesting a refill of his medications because he states that his medications have been  lost.    Lyanne Co, MD 06/11/14 2212

## 2014-06-11 NOTE — ED Notes (Signed)
Bed: WA09 Expected date:  Expected time:  Means of arrival:  Comments: Fractured shoulder, blind

## 2014-06-11 NOTE — ED Notes (Signed)
[facility] and states he does not like where he lives and wanted to come to the ED. Pt has dislocated right shoulder states his medications were being withheld. Facility states pt refused any other meds other than muscle relaxants and pain meds. Pt is blind. Alert and oriented.

## 2014-06-12 ENCOUNTER — Encounter (HOSPITAL_COMMUNITY): Payer: Self-pay

## 2014-06-12 ENCOUNTER — Emergency Department (HOSPITAL_COMMUNITY)
Admission: EM | Admit: 2014-06-12 | Discharge: 2014-06-13 | Disposition: A | Discharge status: Home / Self Care | Payer: Medicaid Other | Attending: Emergency Medicine | Admitting: Emergency Medicine

## 2014-06-12 DIAGNOSIS — G8929 Other chronic pain: Secondary | ICD-10-CM | POA: Insufficient documentation

## 2014-06-12 DIAGNOSIS — Z872 Personal history of diseases of the skin and subcutaneous tissue: Secondary | ICD-10-CM | POA: Insufficient documentation

## 2014-06-12 DIAGNOSIS — Z87442 Personal history of urinary calculi: Secondary | ICD-10-CM | POA: Insufficient documentation

## 2014-06-12 DIAGNOSIS — K219 Gastro-esophageal reflux disease without esophagitis: Secondary | ICD-10-CM | POA: Diagnosis not present

## 2014-06-12 DIAGNOSIS — Z880 Allergy status to penicillin: Secondary | ICD-10-CM | POA: Diagnosis not present

## 2014-06-12 DIAGNOSIS — Z8701 Personal history of pneumonia (recurrent): Secondary | ICD-10-CM | POA: Diagnosis not present

## 2014-06-12 DIAGNOSIS — H548 Legal blindness, as defined in USA: Secondary | ICD-10-CM | POA: Diagnosis not present

## 2014-06-12 DIAGNOSIS — S42411D Displaced simple supracondylar fracture without intercondylar fracture of right humerus, subsequent encounter for fracture with routine healing: Secondary | ICD-10-CM | POA: Diagnosis not present

## 2014-06-12 DIAGNOSIS — Z7951 Long term (current) use of inhaled steroids: Secondary | ICD-10-CM | POA: Insufficient documentation

## 2014-06-12 DIAGNOSIS — X58XXXD Exposure to other specified factors, subsequent encounter: Secondary | ICD-10-CM | POA: Insufficient documentation

## 2014-06-12 DIAGNOSIS — Z7952 Long term (current) use of systemic steroids: Secondary | ICD-10-CM | POA: Insufficient documentation

## 2014-06-12 DIAGNOSIS — Z79899 Other long term (current) drug therapy: Secondary | ICD-10-CM | POA: Insufficient documentation

## 2014-06-12 DIAGNOSIS — I10 Essential (primary) hypertension: Secondary | ICD-10-CM | POA: Diagnosis not present

## 2014-06-12 DIAGNOSIS — R627 Adult failure to thrive: Secondary | ICD-10-CM | POA: Diagnosis present

## 2014-06-12 DIAGNOSIS — F329 Major depressive disorder, single episode, unspecified: Secondary | ICD-10-CM | POA: Insufficient documentation

## 2014-06-12 DIAGNOSIS — F419 Anxiety disorder, unspecified: Secondary | ICD-10-CM | POA: Diagnosis not present

## 2014-06-12 DIAGNOSIS — Z87891 Personal history of nicotine dependence: Secondary | ICD-10-CM | POA: Diagnosis not present

## 2014-06-12 DIAGNOSIS — Z792 Long term (current) use of antibiotics: Secondary | ICD-10-CM | POA: Diagnosis not present

## 2014-06-12 DIAGNOSIS — Z8619 Personal history of other infectious and parasitic diseases: Secondary | ICD-10-CM | POA: Diagnosis not present

## 2014-06-12 DIAGNOSIS — J449 Chronic obstructive pulmonary disease, unspecified: Secondary | ICD-10-CM | POA: Insufficient documentation

## 2014-06-12 NOTE — Progress Notes (Signed)
CSW met with patient at bedside. There was no family present. Patient appears to be blind. Patient states that he is coming from home. Patient informed CSW that he was in the ED yesterday and came from [facility].  Patient stated he does not want to return to [facility]. Patient appeared to be frustrataed once asked about [facility]. Patient stated that he does not like the facility. CSW will offer patient information regarding the ombudsman contact information. Patient informed CSW that he is currently home alone, and is not able to complete his ADL's independently but states he is able to put his clothes on at times.   Patient informed CSW that he needed to use the bathroom. CSW made nurse tech aware. Nurse Tech assisted patient to bathroom.    CSW will file APS report on patient.  Willette Brace [phone] ED CSW 06/12/2014 7:01 PM

## 2014-06-12 NOTE — ED Provider Notes (Signed)
CSN: [id number]     Arrival date & time 06/12/14  1752 History   First MD Initiated Contact with Patient 06/12/14 1851     Chief Complaint  Patient presents with  . Failure To Thrive     (Consider location/radiation/quality/duration/timing/severity/associated sxs/prior Treatment) The history is provided by the patient and medical records. No language interpreter was used.     [name] [name] is a 64 y.o. male  with a hx of legal blindness, GERD, COPD, hypertension, ulcerative colitis, anxiety presents to the Emergency Department complaining that he needs placement. Patient was seen yesterday after leaving his rehabilitation facility stating that he did not like that place. He was placed at [facility] for a recent right proximal humerus fracture. He was initially treated as an outpatient for this fracture but was not able to care for himself at home requiring the facility. Patient left the emergency department yesterday afternoon and returned to his home with home health resources. He reports to me today that he was unable to care for himself at home including fix himself something to eat.  He requested that we consult social work and case management to assist him in obtaining a different rehabilitation facility. He denies other complaints.  Past Medical History  Diagnosis Date  . 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 Cough   . Esophageal reflux   . Osteopenia   . Personal history of other diseases of digestive system   . Contact dermatitis and other eczema, due to unspecified cause   . Unspecified sinusitis (chronic)   . Allergic rhinitis   . Unspecified asthma(493.90)   . COPD (chronic obstructive pulmonary disease)   . Hypertension   . Seasonal allergies   . Ulcerative colitis   . H/O hiatal hernia   . Arthritis     spine  . Anxiety   . Multiple open wounds     "from esczema- scratching"  . Multiple facial bone fractures   . Hep  C w/o coma, chronic early 2013    "possible"  . History of bronchitis   . Pneumonia     in past had several times  . Shortness of breath     "sometime"  . Chronic lower back pain   . Kidney stones   . Depression     patient constantly hitting left side of face "due to pain"  . Pancreatitis   . Headache     PMH: migraines   Past Surgical History  Procedure Laterality Date  . Litrotripsy    . Orif distal radius fracture  09/12/11    right  . Fracture surgery    . Metacarpal pinning      right hand  . Patella fracture surgery      right  . Tonsillectomy and adenoidectomy    . Eye surgery      3 Corneal transplant (bilateral eyes)  . Hardware removal  10/09/2011    Procedure: HARDWARE REMOVAL;  Surgeon: Nadara Mustard, MD;  Location: [facility] OR;  Service: Orthopedics;  Laterality: Right;  Removal Deep Hardware Right Wrist, placement antibiotic beads.  . Orif wrist fracture Right 06/15/2013    Procedure: RIGHT RADIUS PSTEOTOMY, DISTAL ULNA RESECTION, AND DIGITAL FLEXOR LENGTHENING AS NEEDED;  Surgeon: Marlowe Shores, MD;  Location: [facility];  Service: Orthopedics;  Laterality: Right;  . Carpal tunnel release Right 06/15/2013    Procedure: RIGHT CARPAL TUNNEL RELEASE;  Surgeon:  Marlowe Shores, MD;  Location: [facility];  Service: Orthopedics;  Laterality: Right;  . Hernia repair    . Colonoscopy w/ biopsies and polypectomy    . Radiology with anesthesia N/A 03/09/2014    Procedure: MRI - Cervical and Lumbar without Contrast;  Surgeon: Medication Radiologist, MD;  Location: [facility] OR;  Service: Radiology;  Laterality: N/A;   Family History  Problem Relation Age of Onset  . Allergies Mother     atopic  . Asthma Mother   . Heart failure Mother     CHF  . Other Father   . Anesthesia problems Neg Hx    History  Substance Use Topics  . Smoking status: Former Smoker -- 1.00 packs/day for 3 years    Types: Cigarettes, Pipe    Quit date: 05/20/1983  .  Smokeless tobacco: Never Used  . Alcohol Use: Yes     Comment: "scotch once a year"    Review of Systems  Constitutional: Negative for fever, diaphoresis, appetite change, fatigue and unexpected weight change.  HENT: Negative for mouth sores.   Eyes: Negative for visual disturbance.  Respiratory: Negative for cough, chest tightness, shortness of breath and wheezing.   Cardiovascular: Negative for chest pain.  Gastrointestinal: Negative for nausea, vomiting, abdominal pain, diarrhea and constipation.  Endocrine: Negative for polydipsia, polyphagia and polyuria.  Genitourinary: Negative for dysuria, urgency, frequency and hematuria.  Musculoskeletal: Negative for back pain and neck stiffness.  Skin: Negative for rash.  Allergic/Immunologic: Negative for immunocompromised state.  Neurological: Negative for syncope, light-headedness and headaches.  Hematological: Does not bruise/bleed easily.  Psychiatric/Behavioral: Negative for sleep disturbance. The patient is not nervous/anxious.       Allergies  Bacitracin-polymyxin b; Bee venom; Codeine; Norco; Penicillins; Tape; and Chlorhexidine  Home Medications   Prior to Admission medications   Medication Sig Start Date End Date Taking? Authorizing Provider  albuterol (PROVENTIL HFA;VENTOLIN HFA) 108 (90 BASE) MCG/ACT inhaler Inhale 2 puffs into the lungs every 6 (six) hours as needed for wheezing or shortness of breath (wheezing & shortness of breath). 06/11/14  Yes Lyanne Co, MD  fluticasone [facility]) 50 MCG/ACT nasal spray Place 2 sprays into both nostrils daily.   Yes Historical Provider, MD  hydrOXYzine (ATARAX/VISTARIL) 25 MG tablet Take 1 tablet (25 mg total) by mouth 3 (three) times daily as needed for itching (itching). 06/11/14  Yes Lyanne Co, MD  montelukast (SINGULAIR) 10 MG tablet Take 1 tablet (10 mg total) by mouth at bedtime. 06/11/14  Yes Lyanne Co, MD  omeprazole (PRILOSEC) 40 MG capsule Take 1 capsule (40 mg  total) by mouth 2 (two) times daily. 06/11/14  Yes Lyanne Co, MD  Oxycodone HCl 10 MG TABS Take 0.5 tablets (5 mg total) by mouth every 6 (six) hours as needed for severe pain. 06/08/14  Yes Tilden Fossa, MD  prednisoLONE acetate (PRED FORTE) 1 % ophthalmic suspension Place 1 drop into both eyes daily.    Yes Historical Provider, MD  sertraline (ZOLOFT) 100 MG tablet Take 2 tablets (200 mg total) by mouth daily. 06/11/14  Yes Lyanne Co, MD  azithromycin (ZITHROMAX) 500 MG tablet Take 1 tablet (500 mg total) by mouth daily. 06/06/14   Osvaldo Shipper, MD  cephALEXin (KEFLEX) 500 MG capsule Take 1 capsule (500 mg total) by mouth 2 (two) times daily. 06/08/14   Tilden Fossa, MD  diazepam (VALIUM) 10 MG tablet Take 1 tablet (10 mg total) by mouth 3 (three) times daily.  06/06/14   Osvaldo Shipper, MD  gabapentin (NEURONTIN) 300 MG capsule Take 1 capsule (300 mg total) by mouth at bedtime. Patient not taking: Reported on 06/12/2014 06/11/14   Lyanne Co, MD  guaiFENesin (MUCINEX) 600 MG 12 hr tablet Take 1 tablet (600 mg total) by mouth 2 (two) times daily. 06/06/14   Osvaldo Shipper, MD  mometasone-formoterol (DULERA) 200-5 MCG/ACT AERO Inhale 2 puffs into the lungs 2 (two) times daily. 06/11/14   Lyanne Co, MD  Oxycodone HCl 10 MG TABS Take 1 tablet (10 mg total) by mouth every 6 (six) hours as needed (pain). Patient not taking: Reported on 06/12/2014 06/07/14   Fayrene Helper, PA-C  predniSONE (DELTASONE) 20 MG tablet Take 3 tablets once daily for 4 days, then take 2 tablets once daily for 4 days, then take 1 tablet once daily till seen by Dr. Delford Field. 06/06/14   Osvaldo Shipper, MD   BP 126/74 mmHg  Pulse 77  Temp(Src) 98.7 F (37.1 C) (Oral)  Resp 18  SpO2 96% Physical Exam  Constitutional: He appears well-developed and well-nourished. No distress.  Awake, alert, nontoxic appearance  HENT:  Head: Normocephalic and atraumatic.  Mouth/Throat: Oropharynx is clear and moist. No oropharyngeal  exudate.  Eyes: Conjunctivae are normal. No scleral icterus.  Neck: Normal range of motion. Neck supple.  Cardiovascular: Normal rate, regular rhythm and intact distal pulses.   Pulmonary/Chest: Effort normal and breath sounds normal. No respiratory distress. He has no wheezes.  Equal chest expansion  Abdominal: Soft. Bowel sounds are normal. He exhibits no mass. There is no tenderness. There is no rebound and no guarding.  Musculoskeletal: Normal range of motion. He exhibits no edema.  Right upper extremity in a sling, decreased range of motion due to pain, ecchymosis throughout the entirety of the upper extremity but normal capillary refill  Neurological: He is alert.  Speech is clear and goal oriented Moves extremities without ataxia  Skin: Skin is warm and dry. He is not diaphoretic.  Psychiatric: He has a normal mood and affect.  Nursing note and vitals reviewed.   ED Course  Procedures (including critical care time) Labs Review Labs Reviewed - No data to display  Imaging Review No results found.   EKG Interpretation None      MDM   Final diagnoses:  Right supracondylar humerus fracture, with routine healing, subsequent encounter   [name] [name] presents emergency department requesting placement in an assisted living or rehabilitation facility. I have informed him that we will do our best however he will not be able to be admitted to the hospital tonight if we are unable to find placement. Social worker paged.  10:23 PM Patient continues to rest comfortably. Amy Bennie Dallas, Child psychotherapist and case manager spoke with patient at bedside. Patient tells her that he has an aide who comes to his home Mondays and Fridays who helps him with grocery shopping doctor's visits meals and etc. He reports to her that he is able to "get around just fine at home."  He reports that he is unwilling to go to a facility without being able to "check it out first."  He reports that he is unwilling  for this to be an overnight decision.  Patient has a primary care physician. Social worker has left messages for his DSS worker. They will continue to work to increase home care services.  Patient reports the AP is worker has not yet seen him.  Patient remains without medical complaints. He  has tolerated food and drink here in the emergency department. He is ambulated without assistance. He'll be discharged home for advanced Homecare consultation and evaluation tomorrow.  He is blind and will be taken by [facility].  He has an orthopedic surgeon to follow-up with and he will schedule this  I have personally reviewed patient's vitals, nursing note and any pertinent labs or imaging.  I performed an undressed physical exam.    It has been determined that no acute conditions requiring further emergency intervention are present at this time. The patient/guardian have been advised of the diagnosis and plan. I reviewed all labs and imaging including any potential incidental findings. We have discussed signs and symptoms that warrant return to the ED and they are listed in the discharge instructions.    Vital signs are stable at discharge.   BP 126/74 mmHg  Pulse 77  Temp(Src) 98.7 F (37.1 C) (Oral)  Resp 18  SpO2 96%    Dierdre ForthHannah Ryver Poblete, PA-C 06/12/14 2229  Ethelda ChickMartha K Linker, MD 06/12/14 2235

## 2014-06-12 NOTE — Progress Notes (Addendum)
1518 ED CM received a return call from [name] [name] [name] (pt neighbor,lives 2 doors down form pt) [name] [name] states he did not know pt was home Reports pt called him "on Saturday and wanted me to come get him from the nursing home but I would not do it because he needs to be in the nursing home"  [name] [name] Reports assisting pt but "not a very close friend" [name] Novakhanked [name] [name] for his return call and informed him that pt's DSS case worker to make a visit to pt. [name] [name] states he is on his way to work but may get chance to stop to see pt tomorrow  1110 contacted [facility] staff, [name] [name] after received message left by [name] [name] that the [facility] and was d/c home per pt request vs returning to snf. EDP requesting home health services assistance. Services from [facility] can be resumed   1145 Attempted to reach pt at home number but no answer  1150 Left a message at [facility] number [phone] requesting a return call to assist with [facility]  1152 [name] [phone]. [name] [name] states pt does not like staying in facilities and an APS referral done.  [name] [name] is aware of pt trouble with phone service for the blind. CM informed [name] [name] the pt told Cm he had not paid the [employer] bill. [name] [name] to go see pt today and will call Cm back to updated Cm emphasize needed for pt to be available for [facility] staff to provide services.

## 2014-06-12 NOTE — Progress Notes (Signed)
EDCM spoke to patient at bedside with EDSW. EDCM received patient's permission to call DSS worker Warnell Bureauana Phillips [phone].  EDCM called Mr. Vear Clockhillips at this time without success.  EDCM left phone number for call back, also inquired about assisting patient increasing his CAPS services at home.  Patient reports he currently has an aide who comes to his home Mon and Friday evening for two hours at a time.  This aide takes him grocery shopping, takes him to his doctor's visits,  Helps him with meals, helps him with his medications.  Patient also reports he is able to "get around just fine" at home.  Patient gets SSI check.  Patient reports [facility] has not seen him yet. "I like them a lot they have helped me in the past."  Last ED visit patient reported having problems with his phone service for the blind.  This evening, patient reports, "It was working when I left, and I can receive phone calls as well."  Patient reports he has someone working with him to get him into a facility named Lanetta InchBonita Gray [phone].  Patient reports he does not want to go into a facility, "I have to check it out first. It cannot be an overnight decision."    Patient reports, "I have no difficulty seeing my doctor."  Patient confirms his pcp is Dr. Dorothyann Pengobyn Sanders.  Patient reports he was supposed to see his pcp today but came to the ED.  [facility] in [facility] to services. Patient reports APS worker has not seen him yet.  Patient thankful for assistance.  No further EDCM needs at this time.

## 2014-06-12 NOTE — ED Notes (Signed)
[facility]. Pt states can not care for self at home, but [facility]. Pt continues to c/o pain related previous to injury to right arm. Pt denies any new complaints at this time.

## 2014-06-12 NOTE — ED Notes (Signed)
Social work at bedside.  

## 2014-06-12 NOTE — Progress Notes (Signed)
CSW made APS report regarding patient to [place] of DSS.   Trish MageBrittney Treyton Slimp, LCSWA [phone] ED CSW 06/12/2014 11:40 PM

## 2014-06-12 NOTE — Progress Notes (Addendum)
CSW met with patient at bedside along with Nurse CM. Patient informed CSW that he has home health. However, they are only able to come to the patients home 2 days a week which equals to 4 hours a week total.  CSW reached out to patients Social Worker "Hailey" upon his request. However, she did not answer the phone. Patient stated that she is working on a facility placement for him. CSW asked patient about his thoughts returning to a facility. Patient stated that he is not interested going to a facility without first checking it out to determine if he likes it.   Patient informed CSW that he has not taken his medication in 3 days.  Patient stated that he was thirsty. CSW consulted with patient's nurse to ensure he was able to consume liquids. CSW gave patient a soda.  Social Worker/ [place] [phone]  Willette Brace [phone] ED CSW 06/12/2014 10:34 PM

## 2014-06-12 NOTE — ED Notes (Signed)
Ptar called for return home.

## 2014-06-12 NOTE — Discharge Instructions (Signed)
1. Medications: usual home medications °2. Treatment: rest, drink plenty of fluids,  °3. Follow Up: Please followup with your primary doctor in 1 days for discussion of your diagnoses and further evaluation after today's visit; if you do not have a primary care doctor use the resource guide provided to find one; Please return to the ER for new or worsening symptoms ° °

## 2014-06-12 NOTE — ED Notes (Signed)
[facility] Expected date:  Expected time:  Means of arrival:  Comments: EMS- shoulder pain, seen 3xs for same

## 2014-06-12 NOTE — Progress Notes (Signed)
Quick Note:  ATC pt - line rang multiple times with NA and no option to leave msg. WCB ______

## 2014-06-13 ENCOUNTER — Telehealth: Payer: Self-pay | Admitting: Critical Care Medicine

## 2014-06-13 ENCOUNTER — Telehealth (HOSPITAL_COMMUNITY): Payer: Self-pay

## 2014-06-13 NOTE — ED Notes (Signed)
[facility] called pt refusing to leave. Pt states he does not have food or heat in his home. Pt offered to be placed in facility today but refused. Nursing Aid and social work to followup at pt home tomorrow in the morning. Pt continues to refuse to leave ED. Security, PA and charge nurse aware and at bedside.

## 2014-06-13 NOTE — Progress Notes (Signed)
EDCM called and spoke to [facility] who [facility] and is scheduled for a visit today.

## 2014-06-13 NOTE — Telephone Encounter (Signed)
Spoke with [name] [name] regarding sputum results.  He verbalized understanding.  Dr. Delford FieldWright, [name] [name] states he had a tussionex rx prn.  Reports he was taken to a rehab facility and it "randomly disappeared" along with a few other medications.  [name] [name] reports he is no longer at that facility, currently at home.  He would like tussionex rx -- I do not see this med on his current med list.  Please advise.  Thank you.

## 2014-06-13 NOTE — Progress Notes (Signed)
Quick Note:  Spoke wit pt regarding results and recs. He verbalized understanding. ______

## 2014-06-13 NOTE — Telephone Encounter (Signed)
Post ED Visit - Positive Culture Follow-up  Culture report reviewed by antimicrobial stewardship pharmacist: []  Wes Dulaney, Pharm.D., BCPS [x]  Celedonio MiyamotoJeremy Frens, Pharm.D., BCPS []  Georgina PillionElizabeth Martin, [address] BCPS []  [place] NorthMinh Pham, [address]Pharm[address]D., BCPS, AAHIVP []  Estella HuskMichelle Turner, Pharm.D., BCPS, AAHIVP []  Elder CyphersLorie Poole, [address]Pharm[address]D., BCPS  Positive Urine culture, >/= 100,000 colonies -> Staph Species Treated with Cephalexin, organism sensitive to the same and no further patient follow-up is required at this time.  Arvid RightClark, Tanyah Debruyne Dorn 06/13/2014, 6:18 AM

## 2014-06-14 NOTE — Telephone Encounter (Signed)
atc X1, na, nvm. atc X2, line busy.  Wcb.

## 2014-06-14 NOTE — Telephone Encounter (Signed)
Was given in hospital. tussionex 5ML bid prn cough #[id number]

## 2014-06-15 ENCOUNTER — Inpatient Hospital Stay: Payer: Medicaid Other | Admitting: Adult Health

## 2014-06-20 MED ORDER — HYDROCOD POLST-CHLORPHEN POLST 10-8 MG/5ML PO LQCR: 5.0000 mL | Freq: Two times a day (BID) | ORAL | Status: DC | PRN

## 2014-06-20 NOTE — Telephone Encounter (Signed)
Rx called into [facility] -- pt aware and voiced no further questions or concerns at this time.

## 2014-06-20 NOTE — Addendum Note (Signed)
Addended by: Gweneth DimitriJONES, Angeleigh Chiasson D on: 06/20/2014 08:50 AM   Modules accepted: Orders

## 2014-06-20 NOTE — Telephone Encounter (Signed)
Tussionex rx cannot be called in. Spoke with pt -- [facility] Rx printed and signed by PW. [place] -- pt aware.

## 2014-06-21 ENCOUNTER — Inpatient Hospital Stay: Payer: Medicaid Other | Admitting: Adult Health

## 2014-06-21 NOTE — Telephone Encounter (Signed)
Tusionex rx placed at front for pick up - pt aware.

## 2014-06-26 ENCOUNTER — Telehealth: Payer: Self-pay | Admitting: Critical Care Medicine

## 2014-06-26 NOTE — Telephone Encounter (Signed)
Pt asking if Tussionex will be covered by Medicaid. Advised that normally cough syrups are not covered by insurance but that he will need to have this checked by his pharmacy when they run the Rx. Pt states that when he was taken to ED by EMS for a broken shoulder all of his medications were lost. Pt states that he has been unable to locate any of medications. Pt has contacted the ED and Ambulance dept and spoke with someone and no one has seen his medications. Pt states that he was given some medications upon D/C from Hospital (Tussionex) and this has been lost too.  PW okayed refill of Tussionex 06/21/14. Pt states that he is still working to find other medications that were lost.  Raford Pitcherrystal Dawn Jones, RN at 06/21/2014 8:20 AM     Status: Signed       Expand All Collapse All   Tusionex rx placed at front for pick up - pt aware.     Pt made aware that he will have to come this medication up as this cannot be sent into pharmacy d/tbeing controlled substance. Pt states that he was unable to come pick up Rx and will find a way to pick this up soon.   Nothing further needed.

## 2014-07-10 ENCOUNTER — Inpatient Hospital Stay: Payer: Medicaid Other | Admitting: Critical Care Medicine

## 2014-07-19 ENCOUNTER — Encounter: Payer: Self-pay | Admitting: Occupational Therapy

## 2014-07-19 NOTE — Therapy (Signed)
[facility] [address] [place], [place], [address] Phone: [phone]   Fax:  [phone]  Patient Details  Name: [name] [name] MRN: [id number] Date of Birth: 11-17-1950 Referring Provider:  No ref. provider found  Encounter Date: 07/19/2014   OCCUPATIONAL THERAPY DISCHARGE SUMMARY  Visits from Start of Care: 2  Current functional level related to goals / functional outcomes:     OT Long Term Goals - 04/19/14 1231    OT LONG TERM GOAL #1   Title Independent w/ splint wear and care   Baseline Issued splint, may need adjustments   Time 8   Period Weeks   Status Achieved after adjustments, however pt with poor carryover of splint wearing schedule   OT LONG TERM GOAL #2   Title Independent w/ HEP (if appropriate and cleared by MD)   Baseline N/A d/t current precautions   Time 8   Period Weeks   Status Deferred secondary to unable to progress        Remaining deficits: Same as initial evaluation   Education / Equipment: Pt provided w/ splint wear and care, precautions and hygiene education  Plan: Patient agrees to discharge.  Patient goals were partially met. Patient is being discharged due to not returning since the last visit.  (Pt has not returned since 05/16/14, pt no showed last appt)?????        Carey Bullocks, OTR/L 07/19/2014, 8:13 AM  [facility] [address] [place], [place], [address] Phone: [phone]   Fax:  [phone]

## 2014-08-09 ENCOUNTER — Ambulatory Visit (INDEPENDENT_AMBULATORY_CARE_PROVIDER_SITE_OTHER): Payer: Medicaid Other | Admitting: Adult Health

## 2014-08-09 ENCOUNTER — Encounter: Payer: Self-pay | Admitting: Adult Health

## 2014-08-09 VITALS — BP 130/84 | HR 81 | Temp 97.7°F | Ht 70.0 in | Wt 147.0 lb

## 2014-08-09 DIAGNOSIS — J45909 Unspecified asthma, uncomplicated: Secondary | ICD-10-CM

## 2014-08-09 DIAGNOSIS — J3089 Other allergic rhinitis: Secondary | ICD-10-CM

## 2014-08-09 DIAGNOSIS — J45998 Other asthma: Secondary | ICD-10-CM

## 2014-08-09 MED ORDER — OMEPRAZOLE 40 MG PO CPDR: 40.0000 mg | DELAYED_RELEASE_CAPSULE | Freq: Two times a day (BID) | ORAL | Status: DC

## 2014-08-09 MED ORDER — LEVOCETIRIZINE DIHYDROCHLORIDE 5 MG PO TABS: 5.0000 mg | ORAL_TABLET | Freq: Every day | ORAL | Status: DC | PRN

## 2014-08-09 MED ORDER — MONTELUKAST SODIUM 10 MG PO TABS
10.0000 mg | ORAL_TABLET | Freq: Every day | ORAL | Status: DC
Start: 2014-08-09 — End: 2015-12-03

## 2014-08-09 NOTE — Assessment & Plan Note (Signed)
Compensated on present regimen  follow up 3-4 months

## 2014-08-09 NOTE — Progress Notes (Signed)
   Subjective:    Patient ID: [name] [name], male    DOB: Jun 26, 1950, 64 y.o.   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  Does notice with all the pollen that has more drainage and stuffiness .  No cough, purulent sputum, f/c/s, n/v/d, hemoptysis..  Not taking claritin ,can not afford otc.     Review of Systems  Constitutional:   No  weight loss, night sweats,  Fevers, chills, fatigue, or  lassitude.  HEENT:   No headaches,  Difficulty swallowing,  Tooth/dental problems, or  Sore throat,                No sneezing, itching, ear ache,  +nasal congestion, post nasal drip,   CV:  No chest pain,  Orthopnea, PND, swelling in lower extremities, anasarca, dizziness, palpitations, syncope.   GI  No heartburn, indigestion, abdominal pain, nausea, vomiting, diarrhea, change in bowel habits, loss of appetite, bloody stools.   Resp:    No chest wall deformity  Skin: no rash or lesions.  GU: no dysuria, change in color of urine, no urgency or frequency.  No flank pain, no hematuria   MS:  No joint pain or swelling.  No decreased range of motion.  No back pain.  Psych:  No change in mood or affect. No depression or anxiety.  No memory loss.          Objective:   Physical Exam GEN: A/Ox3; pleasant , thin, blind , walks with walking stick   HEENT:  [place]/AT,  EACs-clear, TMs-wnl, NOSE-clear drainage  THROAT-clear, no lesions, no postnasal drip or exudate noted.   NECK:  Supple w/ fair ROM; no JVD; normal carotid impulses w/o bruits; no thyromegaly or nodules palpated; no lymphadenopathy.  RESP  CTA , no wheezing no accessory muscle use, no dullness to percussion  CARD:  RRR, no m/r/g  , no peripheral edema, pulses intact, no cyanosis or clubbing.  GI:   Soft & nt; nml bowel sounds; no organomegaly or masses detected.  Musco: Warm bil, no  deformities or joint swelling noted.   Neuro: alert, blind   Skin: Warm, chronic atopy  '       Assessment & Plan:

## 2014-08-09 NOTE — Assessment & Plan Note (Signed)
Mild flare   Plan  May try Xyzal daily As needed  Drainage.  Mucinex DM As needed  Cough/congestion  Saline nasal rinses As needed   Continue on current regimen  Follow up Dr. Delford FieldWright  3-4 months and As needed   Please contact office for sooner follow up if symptoms do not improve or worsen or seek emergency care

## 2014-08-09 NOTE — Patient Instructions (Signed)
May try Xyzal daily As needed  Drainage.  Mucinex DM As needed  Cough/congestion  Saline nasal rinses As needed   Continue on current regimen  Follow up Dr. Delford FieldWright  3-4 months and As needed   Please contact office for sooner follow up if symptoms do not improve or worsen or seek emergency care

## 2014-08-09 NOTE — Addendum Note (Signed)
Addended by: Boone MasterJONES, Teosha Casso E on: 08/09/2014 11:56 AM   Modules accepted: Orders

## 2014-08-17 ENCOUNTER — Telehealth: Payer: Self-pay | Admitting: *Deleted

## 2014-08-17 NOTE — Telephone Encounter (Signed)
Received fax from [facility] for PA for Levocetirizine 5mg . Called [phone] and spoke with [facility] for alternatives which are generic Zyrtec and Claritin. Would you like to start PA or try alt medications? Pt insurance ID [id number] S. Note: TP out of office till 08/21/14.

## 2014-08-22 NOTE — Telephone Encounter (Signed)
ATC the number listed and it was d/c'ed  Called EC and it was the number for social services  Called and spoke with the pharmacist, Chrissie NoaWilliam at [facility] and explained the situation  He states that pt has a refill on med waiting and he will make sure and tell him to get the zyrtec otc in place of the levocetirizine

## 2014-08-22 NOTE — Telephone Encounter (Signed)
Per TP: would change to otc Zyrtec.  Thanks.

## 2014-10-17 ENCOUNTER — Other Ambulatory Visit: Payer: Self-pay | Admitting: Critical Care Medicine

## 2014-10-19 ENCOUNTER — Ambulatory Visit (INDEPENDENT_AMBULATORY_CARE_PROVIDER_SITE_OTHER): Payer: Medicaid Other | Admitting: Adult Health

## 2014-10-19 ENCOUNTER — Encounter: Payer: Self-pay | Admitting: Adult Health

## 2014-10-19 ENCOUNTER — Other Ambulatory Visit: Payer: Self-pay | Admitting: Critical Care Medicine

## 2014-10-19 VITALS — BP 122/82 | HR 72 | Temp 98.7°F | Ht 70.0 in | Wt 154.2 lb

## 2014-10-19 DIAGNOSIS — J4551 Severe persistent asthma with (acute) exacerbation: Secondary | ICD-10-CM

## 2014-10-19 MED ORDER — PREDNISONE 10 MG PO TABS: ORAL_TABLET | ORAL | Status: DC

## 2014-10-19 MED ORDER — METHYLPREDNISOLONE ACETATE 80 MG/ML IJ SUSP
80.0000 mg | Freq: Once | INTRAMUSCULAR | Status: AC
  Administered 2014-10-19: 80 mg via INTRAMUSCULAR

## 2014-10-19 NOTE — Patient Instructions (Addendum)
Prednisone taper over next week.  Mucinex DM As needed  Cough/congestion  Saline nasal rinses As needed   Continue on Dulera 2 puffs Twice daily  -rinse after use.  Please contact office for sooner follow up if symptoms do not improve or worsen or seek emergency care  Follow up Dr. Delford FieldWright in 2- 3 months and As needed

## 2014-10-19 NOTE — Progress Notes (Signed)
   Subjective:    Patient ID: [name] PattenRoger D Vandenbrink, male    DOB: 06-27-1950, 64 y.o.   MRN: [id number]  HPI Severe persistent asthma steroid dependent significant atopic features  10/19/2014 Acute OV  Pt presents for an acute office visit.  Complains of increased SOB x2-3 weeks, with tightness, wheezing, runny nose w/ PND and sore throat.  Denies any any cough, f/c/s, n/v/d, thick mucus sputum production. Is awaiting corneal transplant.  Not taking otc meds for treatment.      Review of Systems  Constitutional:   No  weight loss, night sweats,  Fevers, chills, fatigue, or  lassitude.  HEENT:   No headaches,  Difficulty swallowing,  Tooth/dental problems, or  Sore throat,                No sneezing, itching, ear ache,  +nasal congestion, post nasal drip,   CV:  No chest pain,  Orthopnea, PND, swelling in lower extremities, anasarca, dizziness, palpitations, syncope.   GI  No heartburn, indigestion, abdominal pain, nausea, vomiting, diarrhea, change in bowel habits, loss of appetite, bloody stools.   Resp:    No chest wall deformity  Skin: no rash or lesions.  GU: no dysuria, change in color of urine, no urgency or frequency.  No flank pain, no hematuria   MS:  No joint pain or swelling.  No decreased range of motion.  No back pain.  Psych:  No change in mood or affect. No depression or anxiety.  No memory loss.          Objective:   Physical Exam GEN: A/Ox3; pleasant , thin, blind , walks with walking stick   HEENT:  [place]/AT,  EACs-clear, TMs-wnl, NOSE-clear drainage  THROAT-clear, no lesions, no postnasal drip or exudate noted.   NECK:  Supple w/ fair ROM; no JVD; normal carotid impulses w/o bruits; no thyromegaly or nodules palpated; no lymphadenopathy.  RESP  CTA , no wheezing no accessory muscle use, no dullness to percussion  CARD:  RRR, no m/r/g  , no peripheral edema, pulses intact, no cyanosis or clubbing.  GI:   Soft & nt; nml bowel sounds; no organomegaly or  masses detected.  Musco: Warm bil, no deformities or joint swelling noted.   Neuro: alert, blind   Skin: Warm, chronic atopy  '       Assessment & Plan:

## 2014-10-19 NOTE — Assessment & Plan Note (Addendum)
Flare with AR  Pt requests depo medrol >depo 80mg  IM x 1   Plan  Prednisone taper over next week.  Mucinex DM As needed  Cough/congestion  Saline nasal rinses As needed   Continue on Dulera 2 puffs Twice daily  -rinse after use.  Please contact office for sooner follow up if symptoms do not improve or worsen or seek emergency care  Follow up Dr. Delford FieldWright in 2- 3 months and As needed

## 2014-10-19 NOTE — Addendum Note (Signed)
Addended by: Boone MasterJONES, Braydon Kullman E on: 10/19/2014 05:19 PM   Modules accepted: Orders

## 2014-10-20 NOTE — Telephone Encounter (Signed)
Shanda BumpsJessica is TP ok to refill ProAir for patient. Dr. Delford FieldWright has in the past last fill another provider elsewhere?

## 2014-11-21 ENCOUNTER — Encounter: Payer: Self-pay | Admitting: Surgery

## 2014-12-19 ENCOUNTER — Encounter (HOSPITAL_COMMUNITY): Payer: Self-pay | Admitting: Emergency Medicine

## 2014-12-19 ENCOUNTER — Emergency Department (HOSPITAL_COMMUNITY): Payer: Medicaid Other

## 2014-12-19 ENCOUNTER — Emergency Department (HOSPITAL_COMMUNITY)
Admission: EM | Admit: 2014-12-19 | Discharge: 2014-12-19 | Disposition: A | Discharge status: Home / Self Care | Payer: Medicaid Other | Attending: Emergency Medicine | Admitting: Emergency Medicine

## 2014-12-19 DIAGNOSIS — F419 Anxiety disorder, unspecified: Secondary | ICD-10-CM | POA: Insufficient documentation

## 2014-12-19 DIAGNOSIS — Z7951 Long term (current) use of inhaled steroids: Secondary | ICD-10-CM | POA: Diagnosis not present

## 2014-12-19 DIAGNOSIS — G8929 Other chronic pain: Secondary | ICD-10-CM | POA: Insufficient documentation

## 2014-12-19 DIAGNOSIS — F329 Major depressive disorder, single episode, unspecified: Secondary | ICD-10-CM | POA: Diagnosis not present

## 2014-12-19 DIAGNOSIS — Z87442 Personal history of urinary calculi: Secondary | ICD-10-CM | POA: Diagnosis not present

## 2014-12-19 DIAGNOSIS — Z8701 Personal history of pneumonia (recurrent): Secondary | ICD-10-CM | POA: Insufficient documentation

## 2014-12-19 DIAGNOSIS — Y939 Activity, unspecified: Secondary | ICD-10-CM | POA: Insufficient documentation

## 2014-12-19 DIAGNOSIS — M549 Dorsalgia, unspecified: Secondary | ICD-10-CM

## 2014-12-19 DIAGNOSIS — Y929 Unspecified place or not applicable: Secondary | ICD-10-CM | POA: Insufficient documentation

## 2014-12-19 DIAGNOSIS — M1990 Unspecified osteoarthritis, unspecified site: Secondary | ICD-10-CM | POA: Insufficient documentation

## 2014-12-19 DIAGNOSIS — S4992XA Unspecified injury of left shoulder and upper arm, initial encounter: Secondary | ICD-10-CM | POA: Insufficient documentation

## 2014-12-19 DIAGNOSIS — Z872 Personal history of diseases of the skin and subcutaneous tissue: Secondary | ICD-10-CM | POA: Diagnosis not present

## 2014-12-19 DIAGNOSIS — W1839XA Other fall on same level, initial encounter: Secondary | ICD-10-CM | POA: Diagnosis not present

## 2014-12-19 DIAGNOSIS — Z880 Allergy status to penicillin: Secondary | ICD-10-CM | POA: Diagnosis not present

## 2014-12-19 DIAGNOSIS — H548 Legal blindness, as defined in USA: Secondary | ICD-10-CM | POA: Insufficient documentation

## 2014-12-19 DIAGNOSIS — Z79899 Other long term (current) drug therapy: Secondary | ICD-10-CM | POA: Diagnosis not present

## 2014-12-19 DIAGNOSIS — Z8619 Personal history of other infectious and parasitic diseases: Secondary | ICD-10-CM | POA: Insufficient documentation

## 2014-12-19 DIAGNOSIS — S299XXA Unspecified injury of thorax, initial encounter: Secondary | ICD-10-CM | POA: Insufficient documentation

## 2014-12-19 DIAGNOSIS — I10 Essential (primary) hypertension: Secondary | ICD-10-CM | POA: Diagnosis not present

## 2014-12-19 DIAGNOSIS — J449 Chronic obstructive pulmonary disease, unspecified: Secondary | ICD-10-CM | POA: Diagnosis not present

## 2014-12-19 DIAGNOSIS — M25512 Pain in left shoulder: Secondary | ICD-10-CM

## 2014-12-19 DIAGNOSIS — Z87891 Personal history of nicotine dependence: Secondary | ICD-10-CM | POA: Insufficient documentation

## 2014-12-19 DIAGNOSIS — Y998 Other external cause status: Secondary | ICD-10-CM | POA: Diagnosis not present

## 2014-12-19 MED ORDER — HYDROXYZINE HCL 25 MG PO TABS
25.0000 mg | ORAL_TABLET | Freq: Once | ORAL | Status: AC
  Administered 2014-12-19: 25 mg via ORAL
  Filled 2014-12-19: qty 1

## 2014-12-19 MED ORDER — DIPHENHYDRAMINE HCL 25 MG PO CAPS
50.0000 mg | ORAL_CAPSULE | Freq: Once | ORAL | Status: DC
  Filled 2014-12-19: qty 2

## 2014-12-19 MED ORDER — OXYCODONE HCL 5 MG PO TABS
10.0000 mg | ORAL_TABLET | Freq: Once | ORAL | Status: AC
  Administered 2014-12-19: 10 mg via ORAL
  Filled 2014-12-19: qty 2

## 2014-12-19 MED ORDER — OXYCODONE HCL 5 MG PO TABS: 5.0000 mg | ORAL_TABLET | ORAL | Status: DC | PRN

## 2014-12-19 NOTE — ED Provider Notes (Signed)
CSN: [id number]     Arrival date & time 12/19/14  1646 History   First MD Initiated Contact with Patient 12/19/14 1658     Chief Complaint  Patient presents with  . Fall  . Irregular Heart Beat     (Consider location/radiation/quality/duration/timing/severity/associated sxs/prior Treatment) Patient is a 64 y.o. male presenting with fall. The history is provided by the patient.  Fall This is a chronic problem. The current episode started 2 days ago. The problem occurs every several days. The problem has not changed since onset.Pertinent negatives include no chest pain, no abdominal pain, no headaches and no shortness of breath. The symptoms are aggravated by walking, bending and twisting. Nothing relieves the symptoms. He has tried nothing for the symptoms. The treatment provided no relief.   64 yo M with a chief complaint of a fall. Patient states that sometimes he falls backwards he has no idea why this happens. Denies loss consciousness denies head injury. This happened 2 days ago. Patient has had slowly worsening left shoulder pain as well as mid thoracic back pain. Patient called his orthopedic surgeon who suggested he come to the emergency department. Past Medical History  Diagnosis Date  . 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 Cough   . Esophageal reflux   . Osteopenia   . Personal history of other diseases of digestive system   . Contact dermatitis and other eczema, due to unspecified cause   . Unspecified sinusitis (chronic)   . Allergic rhinitis   . Unspecified asthma(493.90)   . COPD (chronic obstructive pulmonary disease)   . Hypertension   . Seasonal allergies   . Ulcerative colitis   . H/O hiatal hernia   . Arthritis     spine  . Anxiety   . Multiple open wounds     "from esczema- scratching"  . Multiple facial bone fractures   . 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 History of bronchitis   . Pneumonia     in past had several  times  . Shortness of breath     "sometime"  . Chronic lower back pain   . Kidney stones   . Depression     patient constantly hitting left side of face "due to pain"  . Pancreatitis   . Headache     PMH: migraines   Past Surgical History  Procedure Laterality Date  . Litrotripsy    . Orif distal radius fracture  09/12/11    right  . Fracture surgery    . Metacarpal pinning      right hand  . Patella fracture surgery      right  . Tonsillectomy and adenoidectomy    . Eye surgery      3 Corneal transplant (bilateral eyes)  . Hardware removal  10/09/2011    Procedure: HARDWARE REMOVAL;  Surgeon: Nadara Mustard, MD;  Location: [facility] OR;  Service: Orthopedics;  Laterality: Right;  Removal Deep Hardware Right Wrist, placement antibiotic beads.  . Orif wrist fracture Right 06/15/2013    Procedure: RIGHT RADIUS PSTEOTOMY, DISTAL ULNA RESECTION, AND DIGITAL FLEXOR LENGTHENING AS NEEDED;  Surgeon: Marlowe Shores, MD;  Location: [facility];  Service: Orthopedics;  Laterality: Right;  . Carpal tunnel release Right 06/15/2013    Procedure: RIGHT CARPAL TUNNEL RELEASE;  Surgeon: Marlowe Shores, MD;  Location: [facility];  Service: Orthopedics;  Laterality: Right;  .  Inguinal hernia repair    . Colonoscopy w/ biopsies and polypectomy    . Radiology with anesthesia N/A 03/09/2014    Procedure: MRI - Cervical and Lumbar without Contrast;  Surgeon: Medication Radiologist, MD;  Location: [facility] OR;  Service: Radiology;  Laterality: N/A;   Family History  Problem Relation Age of Onset  . Allergies Mother     atopic  . Asthma Mother   . Heart failure Mother     CHF  . Other Father   . Anesthesia problems Neg Hx    History  Substance Use Topics  . Smoking status: Former Smoker -- 1.00 packs/day for 3 years    Types: Cigarettes, Pipe    Quit date: 05/20/1983  . Smokeless tobacco: Never Used  . Alcohol Use: Yes     Comment: "scotch once a year"    Review of  Systems  Constitutional: Negative for fever and chills.  HENT: Negative for congestion and facial swelling.   Eyes: Negative for discharge and visual disturbance.  Respiratory: Negative for shortness of breath.   Cardiovascular: Negative for chest pain and palpitations.  Gastrointestinal: Negative for vomiting, abdominal pain and diarrhea.  Musculoskeletal: Positive for myalgias and arthralgias.  Skin: Negative for color change and rash.  Neurological: Negative for tremors, syncope and headaches.  Psychiatric/Behavioral: Negative for confusion and dysphoric mood.      Allergies  Bacitracin-polymyxin b; Bee venom; Codeine; Norco; Penicillins; Tape; and Chlorhexidine  Home Medications   Prior to Admission medications   Medication Sig Start Date End Date Taking? Authorizing Provider  albuterol (PROVENTIL HFA;VENTOLIN HFA) 108 (90 BASE) MCG/ACT inhaler Inhale 2 puffs into the lungs every 6 (six) hours as needed for wheezing or shortness of breath (wheezing & shortness of breath). 06/11/14  Yes Azalia Bilis, MD  diazepam (VALIUM) 10 MG tablet Take 1 tablet (10 mg total) by mouth 3 (three) times daily. 06/06/14  Yes Osvaldo Shipper, MD  fluticasone (FLONASE) 50 MCG/ACT nasal spray USE 2 SPRAYS IN EACH NOSTRIL TWICE DAILY. 10/17/14  Yes Storm Frisk, MD  hydrOXYzine (ATARAX/VISTARIL) 25 MG tablet Take 1 tablet (25 mg total) by mouth 3 (three) times daily as needed for itching (itching). 06/11/14  Yes Azalia Bilis, MD  levocetirizine (XYZAL) 5 MG tablet Take 1 tablet (5 mg total) by mouth daily as needed (drainage). 08/09/14  Yes Tammy S Parrett, NP  mometasone-formoterol (DULERA) 200-5 MCG/ACT AERO Inhale 2 puffs into the lungs 2 (two) times daily. 06/11/14  Yes Azalia Bilis, MD  montelukast (SINGULAIR) 10 MG tablet Take 1 tablet (10 mg total) by mouth at bedtime. 08/09/14  Yes Tammy S Parrett, NP  prednisoLONE acetate (PRED FORTE) 1 % ophthalmic suspension Place 1 drop into both eyes daily.    Yes  Historical Provider, MD  sertraline (ZOLOFT) 100 MG tablet Take 2 tablets (200 mg total) by mouth daily. 06/11/14  Yes Azalia Bilis, MD  guaiFENesin (MUCINEX) 600 MG 12 hr tablet Take 1 tablet (600 mg total) by mouth 2 (two) times daily. Patient not taking: Reported on 10/19/2014 06/06/14   Osvaldo Shipper, MD  omeprazole (PRILOSEC) 40 MG capsule Take 1 capsule (40 mg total) by mouth 2 (two) times daily. Patient not taking: Reported on 12/19/2014 08/09/14   Tammy S Parrett, NP  oxyCODONE (ROXICODONE) 5 MG immediate release tablet Take 1 tablet (5 mg total) by mouth every 4 (four) hours as needed for severe pain. 12/19/14   Melene Plan, DO  predniSONE (DELTASONE) 10 MG tablet 4 tabs for  2 days, then 3 tabs for 2 days, 2 tabs for 2 days, then 1 tab for 2 days, then stop Patient not taking: Reported on 12/19/2014 10/19/14   Tammy S Parrett, NP  PROAIR HFA 108 (90 BASE) MCG/ACT inhaler INHALE 2 PUFFS INTO THE LUNGS EVERY 6 HOURS AS NEEDED FOR SHORTNESS OF BREATH Patient not taking: Reported on 12/19/2014 10/20/14   Storm Frisk, MD   BP 117/74 mmHg  Pulse 90  Temp(Src) 98.3 F (36.8 C) (Oral)  Resp 18  SpO2 97% Physical Exam  Constitutional: He is oriented to person, place, and time. He appears well-developed and well-nourished.  HENT:  Head: Normocephalic and atraumatic.  Eyes: EOM are normal. Pupils are equal, round, and reactive to light.  Neck: Normal range of motion. Neck supple. No JVD present.  Cardiovascular: Normal rate and regular rhythm.  Exam reveals no gallop and no friction rub.   No murmur heard. Pulmonary/Chest: No respiratory distress. He has no wheezes.  Abdominal: He exhibits no distension. There is no rebound and no guarding.  Musculoskeletal: Normal range of motion. He exhibits tenderness (Palpation about the mid T-spine as well as to the left anterior portion of the shoulder. Range of motion intact.).  Neurological: He is alert and oriented to person, place, and time.  Skin: No rash  noted. No pallor.  Psychiatric: He has a normal mood and affect. His behavior is normal.    ED Course  Procedures (including critical care time) Labs Review Labs Reviewed - No data to display  Imaging Review Dg Chest 2 View  12/19/2014   CLINICAL DATA:  Pain following fall 1 day prior  EXAM: CHEST  2 VIEW  COMPARISON:  June 08, 2014 chest radiograph and chest CT May 31, 2014  FINDINGS: There is scarring in the left base. There is a probable nipple shadow on the right. Lungs elsewhere are clear. Heart is upper normal in size with pulmonary vascularity within normal limits. Aorta is tortuous but stable. Prominence the right peritracheal region is stable and most likely represents great vessel prominence in this age group. There is no adenopathy. No pneumothorax. There is stable fairly marked collapse of the L1 vertebral body. There is moderate wedging at T11 and T12, likewise stable.  IMPRESSION: Scarring left base.  No edema or consolidation.  Suspect nipple shadow right base. Repeat study with nipple shadows to confirm would be advisable.  Marked collapse of the L1 vertebral body. Wedging at T11-T12 also present. These are stable changes compared to prior studies.  No pneumothorax.   Electronically Signed   By: Bretta Bang III M.D.   On: 12/19/2014 18:26   Dg Thoracic Spine 2 View  12/19/2014   CLINICAL DATA:  Larey Seat yesterday.  Back pain.  EXAM: THORACIC SPINE 2 VIEWS  COMPARISON:  Chest CT 05/31/2014  FINDINGS: There are remote compression fractures of T11, T12 and L1. There is a mild compression deformity of the T9 vertebral body which could be an acute fracture. No obvious paraspinal soft tissue swelling. The visualized lungs are clear.  IMPRESSION: Remote compression fractures of T11, T12 and L1.  Possible new T9 compression fracture.   Electronically Signed   By: Rudie Meyer M.D.   On: 12/19/2014 18:30   Ct Thoracic Spine Wo Contrast  12/19/2014   CLINICAL DATA:  Larey Seat.  Possible T9  compression fracture.  EXAM: CT THORACIC SPINE WITHOUT CONTRAST  TECHNIQUE: Multidetector CT imaging of the thoracic spine was performed without intravenous contrast administration.  Multiplanar CT image reconstructions were also generated.  COMPARISON:  Chest CT 05/31/2014.  FINDINGS: There are remote T12 and T11 compression fractures. No new/ acute thoracic compression fractures. Stable osteoporosis. Chronic lung disease but no acute pulmonary findings.  IMPRESSION: Remote T12 and T11 compression fractures. No new/acute thoracic compression fractures.   Electronically Signed   By: Rudie Meyer M.D.   On: 12/19/2014 19:33   Dg Shoulder Left  12/19/2014   CLINICAL DATA:  Pain following fall 1 day prior  EXAM: LEFT SHOULDER - 2+ VIEW  COMPARISON:  None.  FINDINGS: Frontal and Y scapular images obtained. There is no fracture or dislocation. There is narrowing of the glenohumeral joint. Acromioclavicular joint appears unremarkable. No erosive change or intra-articular calcification.  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 No fracture or dislocation.   Electronically Signed   By: Bretta Bang III M.D.   On: 12/19/2014 18:23     EKG Interpretation None      MDM   Final diagnoses:  Back pain  Left shoulder pain    64 yo M with arthralgia to the left shoulder as well as back pain. Plain films with a possible acute T9 fracture CT scan however has no acute findings patient's pain was well controlled here we'll have him follow his PCP. 7:58 PM:  I have discussed the diagnosis/risks/treatment options with the patient and believe the pt to be eligible for discharge home to follow-up with PCP. We also discussed returning to the ED immediately if new or worsening sx occur. We discussed the sx which are most concerning (e.g., sudden worsening pain) that necessitate immediate return. Medications administered to the patient during their visit and any new prescriptions provided to the  patient are listed below.  Medications given during this visit Medications  diphenhydrAMINE (BENADRYL) capsule 50 mg (50 mg Oral Not Given 12/19/14 1849)  hydrOXYzine (ATARAX/VISTARIL) tablet 25 mg (not administered)  oxyCODONE (Oxy IR/ROXICODONE) immediate release tablet 10 mg (10 mg Oral Given 12/19/14 1742)    New Prescriptions   OXYCODONE (ROXICODONE) 5 MG IMMEDIATE RELEASE TABLET    Take 1 tablet (5 mg total) by mouth every 4 (four) hours as needed for severe pain.     The patient appears reasonably screen and/or stabilized for discharge and I doubt any other medical condition or other [facility] requiring further screening, evaluation, or treatment in the ED at this time prior to discharge.      Melene Plan, DO 12/19/14 1958

## 2014-12-19 NOTE — Progress Notes (Signed)
CSW met with patient at bedside. There was no family present. Patient informed CSW that he presents to [facility] due to shoulder pain. He states that he has back pain also. Patient informed CSW that he is blind.  Patient confirms that he fell yesterday. However, he states that he is unsure of what caused him to fall. Patient states that he has fallen x3 within the  Past 6 months.   Patient informed CSW that he lives home alone in [place]. Patient states that his partner recently passed away. Patient informed CSW that he has a care taker that visits him x2 a week. Also, patient mentioned that he is in the process of receiving home health from a program associated with [facility].  Patient states that he feels safe to return home. Patient is firm in the fact that he is not interested in a facility.  Patient states that he does not have any questions at this time.  Willette Brace [phone] ED CSW 12/19/2014 6:11 PM

## 2014-12-19 NOTE — ED Notes (Signed)
Bed: WA12 Expected date:  Expected time:  Means of arrival:  Comments: Hall [facility] 

## 2014-12-19 NOTE — Discharge Instructions (Signed)

## 2014-12-19 NOTE — ED Notes (Signed)
Bed: [facility] Expected date:  Expected time:  Means of arrival:  Comments: EMS/18M/fall yesterday/back pain

## 2014-12-19 NOTE — ED Notes (Addendum)
Per EMS. Pt had a fall yesterday and is complaining of L arm pain that radiates to scapula and chest wall. Pt also has hx of poorly healed back injury. Pt complains of decreased ROM of L arm, but had full ROM with EMS. During transport EMS noted pt had an episode of A fib, although he does not have a hx of a fib. No dizziness at present. Pt reports he fell backwards yesterday. Pt denies any LOC or dizziness at time of fall. Unsure if he tripped over something yesterday.

## 2015-01-25 ENCOUNTER — Encounter (HOSPITAL_COMMUNITY): Payer: Self-pay | Admitting: Emergency Medicine

## 2015-01-25 ENCOUNTER — Emergency Department (HOSPITAL_COMMUNITY)
Admission: EM | Admit: 2015-01-25 | Discharge: 2015-01-26 | Disposition: A | Discharge status: Home / Self Care | Payer: Medicaid Other | Attending: Emergency Medicine | Admitting: Emergency Medicine

## 2015-01-25 ENCOUNTER — Emergency Department (HOSPITAL_COMMUNITY): Payer: Medicaid Other

## 2015-01-25 DIAGNOSIS — Y9389 Activity, other specified: Secondary | ICD-10-CM | POA: Diagnosis not present

## 2015-01-25 DIAGNOSIS — Z880 Allergy status to penicillin: Secondary | ICD-10-CM | POA: Insufficient documentation

## 2015-01-25 DIAGNOSIS — I10 Essential (primary) hypertension: Secondary | ICD-10-CM | POA: Diagnosis not present

## 2015-01-25 DIAGNOSIS — Z87442 Personal history of urinary calculi: Secondary | ICD-10-CM | POA: Insufficient documentation

## 2015-01-25 DIAGNOSIS — M1990 Unspecified osteoarthritis, unspecified site: Secondary | ICD-10-CM | POA: Insufficient documentation

## 2015-01-25 DIAGNOSIS — H548 Legal blindness, as defined in USA: Secondary | ICD-10-CM | POA: Insufficient documentation

## 2015-01-25 DIAGNOSIS — S3992XA Unspecified injury of lower back, initial encounter: Secondary | ICD-10-CM | POA: Insufficient documentation

## 2015-01-25 DIAGNOSIS — W1839XA Other fall on same level, initial encounter: Secondary | ICD-10-CM | POA: Diagnosis not present

## 2015-01-25 DIAGNOSIS — F419 Anxiety disorder, unspecified: Secondary | ICD-10-CM | POA: Insufficient documentation

## 2015-01-25 DIAGNOSIS — Z872 Personal history of diseases of the skin and subcutaneous tissue: Secondary | ICD-10-CM | POA: Insufficient documentation

## 2015-01-25 DIAGNOSIS — F329 Major depressive disorder, single episode, unspecified: Secondary | ICD-10-CM | POA: Insufficient documentation

## 2015-01-25 DIAGNOSIS — Z8619 Personal history of other infectious and parasitic diseases: Secondary | ICD-10-CM | POA: Diagnosis not present

## 2015-01-25 DIAGNOSIS — Z79899 Other long term (current) drug therapy: Secondary | ICD-10-CM | POA: Diagnosis not present

## 2015-01-25 DIAGNOSIS — G8929 Other chronic pain: Secondary | ICD-10-CM | POA: Diagnosis not present

## 2015-01-25 DIAGNOSIS — Z7951 Long term (current) use of inhaled steroids: Secondary | ICD-10-CM | POA: Diagnosis not present

## 2015-01-25 DIAGNOSIS — J449 Chronic obstructive pulmonary disease, unspecified: Secondary | ICD-10-CM | POA: Diagnosis not present

## 2015-01-25 DIAGNOSIS — Z87891 Personal history of nicotine dependence: Secondary | ICD-10-CM | POA: Insufficient documentation

## 2015-01-25 DIAGNOSIS — Z8701 Personal history of pneumonia (recurrent): Secondary | ICD-10-CM | POA: Insufficient documentation

## 2015-01-25 DIAGNOSIS — K219 Gastro-esophageal reflux disease without esophagitis: Secondary | ICD-10-CM | POA: Diagnosis not present

## 2015-01-25 DIAGNOSIS — Y92002 Bathroom of unspecified non-institutional (private) residence single-family (private) house as the place of occurrence of the external cause: Secondary | ICD-10-CM | POA: Insufficient documentation

## 2015-01-25 DIAGNOSIS — M62830 Muscle spasm of back: Secondary | ICD-10-CM | POA: Insufficient documentation

## 2015-01-25 DIAGNOSIS — Y998 Other external cause status: Secondary | ICD-10-CM | POA: Insufficient documentation

## 2015-01-25 DIAGNOSIS — M549 Dorsalgia, unspecified: Secondary | ICD-10-CM

## 2015-01-25 MED ORDER — HYDROMORPHONE HCL 1 MG/ML IJ SOLN
1.0000 mg | Freq: Once | INTRAMUSCULAR | Status: AC
  Administered 2015-01-25: 1 mg via INTRAVENOUS
  Filled 2015-01-25: qty 1

## 2015-01-25 NOTE — ED Notes (Signed)
Per EMS patient reported falling bathroom this morning. States he was able to move around without issues for a significant period of time after this.  After taking nap this afternoon patient reports right sided mid-back pain exacerbated by movement.

## 2015-01-25 NOTE — ED Provider Notes (Signed)
CSN: [id number]     Arrival date & time 01/25/15  2236 History   First MD Initiated Contact with Patient 01/25/15 2243     Chief Complaint  Patient presents with  . Back Pain     (Consider location/radiation/quality/duration/timing/severity/associated sxs/prior Treatment) HPI Comments: Patient presents to the ED with a chief complaint of back pain. Patient states that he fell while in the bathroom today.  He states that he landed on the rim of the bath tub and struck this with his back.  He states that he rested for most of the day, but the when he got up this evening he was having severe muscle spasms and cramps.  He has not tried taking anything to alleviate his symptoms.  His symptoms are aggravated with palpation and movement.  He denies any bowel or bladder problems.  He denies any weakness.  The history is provided by the patient. No language interpreter was used.    Past Medical History  Diagnosis Date  . 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 Cough   . Esophageal reflux   . Osteopenia   . Personal history of other diseases of digestive system   . Contact dermatitis and other eczema, due to unspecified cause   . Unspecified sinusitis (chronic)   . Allergic rhinitis   . Unspecified asthma(493.90)   . COPD (chronic obstructive pulmonary disease)   . Hypertension   . Seasonal allergies   . Ulcerative colitis   . H/O hiatal hernia   . Arthritis     spine  . Anxiety   . Multiple open wounds     "from esczema- scratching"  . Multiple facial bone fractures   . 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 History of bronchitis   . Pneumonia     in past had several times  . Shortness of breath     "sometime"  . Chronic lower back pain   . Kidney stones   . Depression     patient constantly hitting left side of face "due to pain"  . Pancreatitis   . Headache     PMH: migraines   Past Surgical History  Procedure Laterality Date  .  Litrotripsy    . Orif distal radius fracture  09/12/11    right  . Fracture surgery    . Metacarpal pinning      right hand  . Patella fracture surgery      right  . Tonsillectomy and adenoidectomy    . Eye surgery      3 Corneal transplant (bilateral eyes)  . Hardware removal  10/09/2011    Procedure: HARDWARE REMOVAL;  Surgeon: Nadara Mustard, MD;  Location: [facility] OR;  Service: Orthopedics;  Laterality: Right;  Removal Deep Hardware Right Wrist, placement antibiotic beads.  . Orif wrist fracture Right 06/15/2013    Procedure: RIGHT RADIUS PSTEOTOMY, DISTAL ULNA RESECTION, AND DIGITAL FLEXOR LENGTHENING AS NEEDED;  Surgeon: Marlowe Shores, MD;  Location: [facility];  Service: Orthopedics;  Laterality: Right;  . Carpal tunnel release Right 06/15/2013    Procedure: RIGHT CARPAL TUNNEL RELEASE;  Surgeon: Marlowe Shores, MD;  Location:  SURGERY CENTER;  Service: Orthopedics;  Laterality: Right;  . Inguinal hernia repair    . Colonoscopy w/ biopsies and polypectomy    . Radiology with anesthesia N/A 03/09/2014    Procedure: MRI - Cervical and Lumbar without  Contrast;  Surgeon: Medication Radiologist, MD;  Location: [facility] OR;  Service: Radiology;  Laterality: N/A;   Family History  Problem Relation Age of Onset  . Allergies Mother     atopic  . Asthma Mother   . Heart failure Mother     CHF  . Other Father   . Anesthesia problems Neg Hx    Social History  Substance Use Topics  . Smoking status: Former Smoker -- 1.00 packs/day for 3 years    Types: Cigarettes, Pipe    Quit date: 05/20/1983  . Smokeless tobacco: Never Used  . Alcohol Use: Yes     Comment: "scotch once a year"    Review of Systems  Constitutional: Negative for fever and chills.  Gastrointestinal:       No bowel incontinence  Genitourinary:       No urinary incontinence  Musculoskeletal: Positive for myalgias, back pain and arthralgias.  Neurological:       No saddle anesthesia       Allergies  Bacitracin-polymyxin b; Bee venom; Codeine; Norco; Penicillins; Tape; and Chlorhexidine  Home Medications   Prior to Admission medications   Medication Sig Start Date End Date Taking? Authorizing Provider  albuterol (PROVENTIL HFA;VENTOLIN HFA) 108 (90 BASE) MCG/ACT inhaler Inhale 2 puffs into the lungs every 6 (six) hours as needed for wheezing or shortness of breath (wheezing & shortness of breath). 06/11/14   Azalia Bilis, MD  diazepam (VALIUM) 10 MG tablet Take 1 tablet (10 mg total) by mouth 3 (three) times daily. 06/06/14   Osvaldo Shipper, MD  fluticasone (FLONASE) 50 MCG/ACT nasal spray USE 2 SPRAYS IN EACH NOSTRIL TWICE DAILY. 10/17/14   Storm Frisk, MD  guaiFENesin (MUCINEX) 600 MG 12 hr tablet Take 1 tablet (600 mg total) by mouth 2 (two) times daily. Patient not taking: Reported on 10/19/2014 06/06/14   Osvaldo Shipper, MD  hydrOXYzine (ATARAX/VISTARIL) 25 MG tablet Take 1 tablet (25 mg total) by mouth 3 (three) times daily as needed for itching (itching). 06/11/14   Azalia Bilis, MD  levocetirizine (XYZAL) 5 MG tablet Take 1 tablet (5 mg total) by mouth daily as needed (drainage). 08/09/14   Tammy S Parrett, NP  mometasone-formoterol (DULERA) 200-5 MCG/ACT AERO Inhale 2 puffs into the lungs 2 (two) times daily. 06/11/14   Azalia Bilis, MD  montelukast (SINGULAIR) 10 MG tablet Take 1 tablet (10 mg total) by mouth at bedtime. 08/09/14   Tammy S Parrett, NP  omeprazole (PRILOSEC) 40 MG capsule Take 1 capsule (40 mg total) by mouth 2 (two) times daily. Patient not taking: Reported on 12/19/2014 08/09/14   Tammy S Parrett, NP  oxyCODONE (ROXICODONE) 5 MG immediate release tablet Take 1 tablet (5 mg total) by mouth every 4 (four) hours as needed for severe pain. 12/19/14   Melene Plan, DO  prednisoLONE acetate (PRED FORTE) 1 % ophthalmic suspension Place 1 drop into both eyes daily.     Historical Provider, MD  predniSONE (DELTASONE) 10 MG tablet 4 tabs for 2 days, then 3 tabs for 2  days, 2 tabs for 2 days, then 1 tab for 2 days, then stop Patient not taking: Reported on 12/19/2014 10/19/14   Virgel Bouquet Parrett, NP  PROAIR HFA 108 (90 BASE) MCG/ACT inhaler INHALE 2 PUFFS INTO THE LUNGS EVERY 6 HOURS AS NEEDED FOR SHORTNESS OF BREATH Patient not taking: Reported on 12/19/2014 10/20/14   Storm Frisk, MD  sertraline (ZOLOFT) 100 MG tablet Take 2 tablets (200 mg total)  by mouth daily. 06/11/14   Azalia Bilis, MD   BP 170/108 mmHg  Pulse 78  Temp(Src) 97.8 F (36.6 C) (Oral)  Resp 21  SpO2 93% Physical Exam  Constitutional: He is oriented to person, place, and time. He appears well-developed and well-nourished. No distress.  HENT:  Head: Normocephalic and atraumatic.  Eyes: Conjunctivae and EOM are normal. Right eye exhibits no discharge. Left eye exhibits no discharge. No scleral icterus.  Neck: Normal range of motion. Neck supple. No tracheal deviation present.  Cardiovascular: Normal rate, regular rhythm and normal heart sounds.  Exam reveals no gallop and no friction rub.   No murmur heard. Pulmonary/Chest: Effort normal and breath sounds normal. No respiratory distress. He has no wheezes.  Abdominal: Soft. He exhibits no distension. There is no tenderness.  Musculoskeletal: Normal range of motion.  Lumbar paraspinal muscles tender to palpation, no bony tenderness, step-offs, or gross abnormality or deformity of spine, patient is able to ambulate, moves all extremities  Bilateral great toe extension intact Bilateral plantar/dorsiflexion intact  Neurological: He is alert and oriented to person, place, and time.  Sensation and strength intact bilaterally   Skin: Skin is warm. He is not diaphoretic.  Psychiatric: He has a normal mood and affect. His behavior is normal. Judgment and thought content normal.  Nursing note and vitals reviewed.   ED Course  Procedures (including critical care time) Labs Review Labs Reviewed - No data to display  Imaging Review No  results found. I have personally reviewed and evaluated these images and lab results as part of my medical decision-making.   EKG Interpretation None      MDM   Final diagnoses:  Pain of mid back  Muscle spasm of back    Patient with mechanical fall this morning.  Landed on bathtub frame.  Has had muscle spasms and pain in his back today.  Will check plain films.  Will treat pain and reassess.  Patient reassessed. He is no longer having asked spasms or wincing in pain, but he states that the Dilaudid did not do anything for his pain. He is requesting 30mg  tablets of oxycodone. Patient states that he takes this along with 10 mg of Valium 3 times a day. Patient advised that I cannot do anything more for his pain.   Plain films negative for any acute injury. Chronic compression fractures see in T11,12, and L1.  Will discharge to home. Will recommend pain management follow-up. Recommend conservative therapy with ice and heat and stretching.    Roxy Horseman, PA-C 01/26/15 2956  Linwood Dibbles, MD 01/26/15 [phone]

## 2015-01-25 NOTE — ED Notes (Signed)
Bed: ZH08 Expected date:  Expected time:  Means of arrival:  Comments: EMS fall/ back spasms

## 2015-01-25 NOTE — ED Notes (Signed)
PA at the bedside.

## 2015-01-25 NOTE — ED Notes (Signed)
Patient transported to X-ray 

## 2015-01-25 NOTE — ED Notes (Signed)
IV attempt x2 unsuccessful. Charge nurse requested to bedside for assistance.

## 2015-01-26 ENCOUNTER — Ambulatory Visit: Payer: Medicaid Other | Admitting: Adult Health

## 2015-01-26 MED ORDER — HYDROMORPHONE HCL 1 MG/ML IJ SOLN
1.0000 mg | Freq: Once | INTRAMUSCULAR | Status: AC
  Administered 2015-01-26: 1 mg via INTRAVENOUS
  Filled 2015-01-26: qty 1

## 2015-01-26 NOTE — ED Notes (Signed)
[facility] called for patient transport 

## 2015-01-26 NOTE — ED Notes (Signed)
[facility] arrived to pick up patient.  

## 2015-01-26 NOTE — ED Notes (Signed)
Patient states he did not have relief from his initial dose of pain medication. Patient denies taking pain meds at home, reports years ago he was a patient at a pain clinic and took large quantities of pain medication.

## 2015-01-26 NOTE — ED Notes (Signed)
PA at bedside.

## 2015-01-26 NOTE — Discharge Instructions (Signed)
Back Pain, Adult °Low back pain is very common. About 1 in 5 people have back pain. The cause of low back pain is rarely dangerous. The pain often gets better over time. About half of people with a sudden onset of back pain feel better in just 2 weeks. About 8 in 10 people feel better by 6 weeks.  °CAUSES °Some common causes of back pain include: °· Strain of the muscles or ligaments supporting the spine. °· Wear and tear (degeneration) of the spinal discs. °· Arthritis. °· Direct injury to the back. °DIAGNOSIS °Most of the time, the direct cause of low back pain is not known. However, back pain can be treated effectively even when the exact cause of the pain is unknown. Answering your caregiver's questions about your overall health and symptoms is one of the most accurate ways to make sure the cause of your pain is not dangerous. If your caregiver needs more information, he or she may order lab work or imaging tests (X-rays or MRIs). However, even if imaging tests show changes in your back, this usually does not require surgery. °HOME CARE INSTRUCTIONS °For many people, back pain returns. Since low back pain is rarely dangerous, it is often a condition that people can learn to manage on their own.  °· Remain active. It is stressful on the back to sit or stand in one place. Do not sit, drive, or stand in one place for more than 30 minutes at a time. Take short walks on level surfaces as soon as pain allows. Try to increase the length of time you walk each day. °· Do not stay in bed. Resting more than 1 or 2 days can delay your recovery. °· Do not avoid exercise or work. Your body is made to move. It is not dangerous to be active, even though your back may hurt. Your back will likely heal faster if you return to being active before your pain is gone. °· Pay attention to your body when you  bend and lift. Many people have less discomfort when lifting if they bend their knees, keep the load close to their bodies, and  avoid twisting. Often, the most comfortable positions are those that put less stress on your recovering back. °· Find a comfortable position to sleep. Use a firm mattress and lie on your side with your knees slightly bent. If you lie on your back, put a pillow under your knees. °· Only take over-the-counter or prescription medicines as directed by your caregiver. Over-the-counter medicines to reduce pain and inflammation are often the most helpful. Your caregiver may prescribe muscle relaxant drugs. These medicines help dull your pain so you can more quickly return to your normal activities and healthy exercise. °· Put ice on the injured area. °· Put ice in a plastic bag. °· Place a towel between your skin and the bag. °· Leave the ice on for 15-20 minutes, 03-04 times a day for the first 2 to 3 days. After that, ice and heat may be alternated to reduce pain and spasms. °· Ask your caregiver about trying back exercises and gentle massage. This may be of some benefit. °· Avoid feeling anxious or stressed. Stress increases muscle tension and can worsen back pain. It is important to recognize when you are anxious or stressed and learn ways to manage it. Exercise is a great option. °SEEK MEDICAL CARE IF: °· You have pain that is not relieved with rest or medicine. °· You have pain that does not improve in 1 week. °· You have new symptoms. °· You are generally not feeling well. °SEEK   IMMEDIATE MEDICAL CARE IF:  °· You have pain that radiates from your back into your legs. °· You develop new bowel or bladder control problems. °· You have unusual weakness or numbness in your arms or legs. °· You develop nausea or vomiting. °· You develop abdominal pain. °· You feel faint. °Document Released: 05/05/2005 Document Revised: 11/04/2011 Document Reviewed: 09/06/2013 °ExitCare® Patient Information ©2015 [employer]. This information is not intended to replace advice given to you by your health care provider. Make sure you  discuss any questions you have with your health care provider. ° °Muscle Cramps and Spasms °Muscle cramps and spasms occur when a muscle or muscles tighten and you have no control over this tightening (involuntary muscle contraction). They are a common problem and can develop in any muscle. The most common place is in the calf muscles of the leg. Both muscle cramps and muscle spasms are involuntary muscle contractions, but they also have differences:  °· Muscle cramps are sporadic and painful. They may last a few seconds to a quarter of an hour. Muscle cramps are often more forceful and last longer than muscle spasms. °· Muscle spasms may or may not be painful. They may also last just a few seconds or much longer. °CAUSES  °It is uncommon for cramps or spasms to be due to a serious underlying problem. In many cases, the cause of cramps or spasms is unknown. Some common causes are:  °· Overexertion.   °· Overuse from repetitive motions (doing the same thing over and over).   °· Remaining in a certain position for a long period of time.   °· Improper preparation, form, or technique while performing a sport or activity.   °· Dehydration.   °· Injury.   °· Side effects of some medicines.   °· Abnormally low levels of the salts and ions in your blood (electrolytes), especially potassium and calcium. This could happen if you are taking water pills (diuretics) or you are pregnant.   °Some underlying medical problems can make it more likely to develop cramps or spasms. These include, but are not limited to:  °· Diabetes.   °· Parkinson disease.   °· Hormone disorders, such as thyroid problems.   °· Alcohol abuse.   °· Diseases specific to muscles, joints, and bones.   °· Blood vessel disease where not enough blood is getting to the muscles.   °HOME CARE INSTRUCTIONS  °· Stay well hydrated. Drink enough water and fluids to keep your urine clear or pale yellow. °· It may be helpful to massage, stretch, and relax the affected  muscle. °· For tight or tense muscles, use a warm towel, heating pad, or hot shower water directed to the affected area. °· If you are sore or have pain after a cramp or spasm, applying ice to the affected area may relieve discomfort. °¨ Put ice in a plastic bag. °¨ Place a towel between your skin and the bag. °¨ Leave the ice on for 15-20 minutes, 03-04 times a day. °· Medicines used to treat a known cause of cramps or spasms may help reduce their frequency or severity. Only take over-the-counter or prescription medicines as directed by your caregiver. °SEEK MEDICAL CARE IF:  °Your cramps or spasms get more severe, more frequent, or do not improve over time.  °MAKE SURE YOU:  °· Understand these instructions. °· Will watch your condition. °· Will get help right away if you are not doing well or get worse. °Document Released: 10/25/2001 Document Revised: 08/30/2012 Document Reviewed: 04/21/2012 °ExitCare® Patient Information ©2015 [employer].   This information is not intended to replace advice given to you by your health care provider. Make sure you discuss any questions you have with your health care provider. ° °

## 2015-02-12 ENCOUNTER — Telehealth: Payer: Self-pay | Admitting: Critical Care Medicine

## 2015-02-12 MED ORDER — PREDNISONE 10 MG PO TABS: ORAL_TABLET | ORAL | Status: DC

## 2015-02-12 NOTE — Telephone Encounter (Signed)
Spoke with pt. He is aware of VS's recommendation. Rx has been sent in. He will call back to make ROV.

## 2015-02-12 NOTE — Telephone Encounter (Signed)
Spoke with pt. Reports he is having increased SOB and wheezing. States that has been ongoing for a few months. Denies chest tightness or coughing. Currently taking Dulera and Albuterol HFA with minimal relief.  VS - please advise. Thanks.

## 2015-02-12 NOTE — Telephone Encounter (Signed)
Can send script for prednisone 10 mg pill >> 3 pills daily for 2 days, 2 pills daily for 2 days, 1 pill daily for 2 days.  He needs ROV to further assess.

## 2015-02-17 ENCOUNTER — Other Ambulatory Visit: Payer: Self-pay | Admitting: Critical Care Medicine

## 2015-03-06 ENCOUNTER — Ambulatory Visit: Payer: Medicaid Other | Admitting: Neurology

## 2015-03-14 ENCOUNTER — Telehealth: Payer: Self-pay

## 2015-03-14 ENCOUNTER — Encounter (INDEPENDENT_AMBULATORY_CARE_PROVIDER_SITE_OTHER): Payer: Self-pay

## 2015-03-14 ENCOUNTER — Ambulatory Visit (INDEPENDENT_AMBULATORY_CARE_PROVIDER_SITE_OTHER): Payer: Medicaid Other | Admitting: Neurology

## 2015-03-14 ENCOUNTER — Encounter: Payer: Self-pay | Admitting: Neurology

## 2015-03-14 VITALS — BP 154/97 | HR 80 | Ht 68.0 in | Wt 144.5 lb

## 2015-03-14 DIAGNOSIS — R2681 Unsteadiness on feet: Secondary | ICD-10-CM

## 2015-03-14 DIAGNOSIS — R413 Other amnesia: Secondary | ICD-10-CM | POA: Diagnosis not present

## 2015-03-14 NOTE — Telephone Encounter (Signed)
The patient asked at check out if he could be put to sleep for his MRI. He stated that there is a place on his back that hurts too much to lay still. He was advised that we would ask Dr. Anne HahnWillis and give him a call.

## 2015-03-14 NOTE — Patient Instructions (Signed)
Fall Prevention in the Home  Falls can cause injuries and can affect people from all age groups. There are many simple things that you can do to make your home safe and to help prevent falls. WHAT CAN I DO ON THE OUTSIDE OF MY HOME?  Regularly repair the edges of walkways and driveways and fix any cracks.  Remove high doorway thresholds.  Trim any shrubbery on the main path into your home.  Use bright outdoor lighting.  Clear walkways of debris and clutter, including tools and rocks.  Regularly check that handrails are securely fastened and in good repair. Both sides of any steps should have handrails.  Install guardrails along the edges of any raised decks or porches.  Have leaves, snow, and ice cleared regularly.  Use sand or salt on walkways during winter months.  In the garage, clean up any spills right away, including grease or oil spills. WHAT CAN I DO IN THE BATHROOM?  Use night lights.  Install grab bars by the toilet and in the tub and shower. Do not use towel bars as grab bars.  Use non-skid mats or decals on the floor of the tub or shower.  If you need to sit down while you are in the shower, use a plastic, non-slip stool..  Keep the floor dry. Immediately clean up any water that spills on the floor.  Remove soap buildup in the tub or shower on a regular basis.  Attach bath mats securely with double-sided non-slip rug tape.  Remove throw rugs and other tripping hazards from the floor. WHAT CAN I DO IN THE BEDROOM?  Use night lights.  Make sure that a bedside light is easy to reach.  Do not use oversized bedding that drapes onto the floor.  Have a firm chair that has side arms to use for getting dressed.  Remove throw rugs and other tripping hazards from the floor. WHAT CAN I DO IN THE KITCHEN?   Clean up any spills right away.  Avoid walking on wet floors.  Place frequently used items in easy-to-reach places.  If you need to reach for something  above you, use a sturdy step stool that has a grab bar.  Keep electrical cables out of the way.  Do not use floor polish or wax that makes floors slippery. If you have to use wax, make sure that it is non-skid floor wax.  Remove throw rugs and other tripping hazards from the floor. WHAT CAN I DO IN THE STAIRWAYS?  Do not leave any items on the stairs.  Make sure that there are handrails on both sides of the stairs. Fix handrails that are broken or loose. Make sure that handrails are as long as the stairways.  Check any carpeting to make sure that it is firmly attached to the stairs. Fix any carpet that is loose or worn.  Avoid having throw rugs at the top or bottom of stairways, or secure the rugs with carpet tape to prevent them from moving.  Make sure that you have a light switch at the top of the stairs and the bottom of the stairs. If you do not have them, have them installed. WHAT ARE SOME OTHER FALL PREVENTION TIPS?  Wear closed-toe shoes that fit well and support your feet. Wear shoes that have rubber soles or low heels.  When you use a stepladder, make sure that it is completely opened and that the sides are firmly locked. Have someone hold the ladder while you   are using it. Do not climb a closed stepladder.  Add color or contrast paint or tape to grab bars and handrails in your home. Place contrasting color strips on the first and last steps.  Use mobility aids as needed, such as canes, walkers, scooters, and crutches.  Turn on lights if it is dark. Replace any light bulbs that burn out.  Set up furniture so that there are clear paths. Keep the furniture in the same spot.  Fix any uneven floor surfaces.  Choose a carpet design that does not hide the edge of steps of a stairway.  Be aware of any and all pets.  Review your medicines with your healthcare provider. Some medicines can cause dizziness or changes in blood pressure, which increase your risk of falling. Talk  with your health care provider about other ways that you can decrease your risk of falls. This may include working with a physical therapist or trainer to improve your strength, balance, and endurance.   This information is not intended to replace advice given to you by your health care provider. Make sure you discuss any questions you have with your health care provider.   Document Released: 04/25/2002 Document Revised: 09/19/2014 Document Reviewed: 06/09/2014 Elsevier Interactive Patient Education 2016 [employer]  

## 2015-03-14 NOTE — Telephone Encounter (Signed)
I called patient. The patient indicated that he needs general anesthesia for MRI evaluation due to severe back pain. He may need to do the scan in the hospital.

## 2015-03-14 NOTE — Telephone Encounter (Signed)
I tried to call the patient. No answer, I will call back later.
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
## 2015-03-16 ENCOUNTER — Ambulatory Visit (INDEPENDENT_AMBULATORY_CARE_PROVIDER_SITE_OTHER): Payer: Medicaid Other | Admitting: Adult Health

## 2015-03-16 ENCOUNTER — Encounter: Payer: Self-pay | Admitting: Adult Health

## 2015-03-16 VITALS — BP 122/78 | HR 79 | Temp 97.8°F | Ht 68.0 in | Wt 145.0 lb

## 2015-03-16 DIAGNOSIS — J329 Chronic sinusitis, unspecified: Secondary | ICD-10-CM

## 2015-03-16 DIAGNOSIS — J45998 Other asthma: Secondary | ICD-10-CM | POA: Diagnosis not present

## 2015-03-16 DIAGNOSIS — J45909 Unspecified asthma, uncomplicated: Secondary | ICD-10-CM

## 2015-03-16 MED ORDER — PREDNISONE 10 MG PO TABS: ORAL_TABLET | ORAL | Status: DC

## 2015-03-16 MED ORDER — AZITHROMYCIN 250 MG PO TABS: ORAL_TABLET | ORAL | Status: AC

## 2015-03-16 NOTE — Assessment & Plan Note (Signed)
Flare   Plan  Zpack take as directed.  Prednisone taper over next week.  Mucinex DM As needed  Cough/congestion  Saline nasal rinses As needed   Continue on Dulera 2 puffs Twice daily  -rinse after use.  Please contact office for sooner follow up if symptoms do not improve or worsen or seek emergency care  Follow up with Dr. Isaiah SergeMannam in 3 months and As needed

## 2015-03-16 NOTE — Progress Notes (Signed)
   Subjective:    Patient ID: [name] [name], male    DOB: 22-Jun-1950, 64 y.o.   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  Complains of increased SOB x4 weeks of tightness, wheezing, runny nose w/ PND and sore throat.  Denies any any cough, f/c/s, n/v/d or hemoptysis  Called in pred taper 1 month ago with only minmal improvement.  Not taking otc meds for treatment.  His partner died recently , support provided.     Review of Systems   Constitutional:   No  weight loss, night sweats,  Fevers, chills, fatigue, or  lassitude.  HEENT:   No headaches,  Difficulty swallowing,  Tooth/dental problems, or  Sore throat,                No sneezing, itching, ear ache,  +nasal congestion, post nasal drip,   CV:  No chest pain,  Orthopnea, PND, swelling in lower extremities, anasarca, dizziness, palpitations, syncope.   GI  No heartburn, indigestion, abdominal pain, nausea, vomiting, diarrhea, change in bowel habits, loss of appetite, bloody stools.   Resp:    No chest wall deformity  Skin: no rash or lesions.  GU: no dysuria, change in color of urine, no urgency or frequency.  No flank pain, no hematuria   MS:  No joint pain or swelling.  No decreased range of motion.  No back pain.  Psych:  No change in mood or affect. No depression or anxiety.  No memory loss.          Objective:   Physical Exam  GEN: A/Ox3; pleasant , thin, blind , walks with walking stick   HEENT:  [place]/AT,  EACs-clear, TMs-wnl, NOSE-clear drainage  THROAT-clear, no lesions, no postnasal drip or exudate noted.   NECK:  Supple w/ fair ROM; no JVD; normal carotid impulses w/o bruits; no thyromegaly or nodules palpated; no lymphadenopathy.  RESP  Faint exp wheezing wheezing no accessory muscle use, no dullness to percussion, talking in full sentences   CARD:  RRR, no m/r/g   , no peripheral edema, pulses intact, no cyanosis or clubbing.  GI:   Soft & nt; nml bowel sounds; no organomegaly or masses detected.  Musco: Warm bil, no deformities or joint swelling noted.   Neuro: alert, blind   Skin: Warm, chronic atopy  '       Assessment & Plan:

## 2015-03-16 NOTE — Assessment & Plan Note (Signed)
Slow to resolve flare   Plan  Zpack take as directed.  Prednisone taper over next week.  Mucinex DM As needed  Cough/congestion  Saline nasal rinses As needed   Continue on Dulera 2 puffs Twice daily  -rinse after use.  Please contact office for sooner follow up if symptoms do not improve or worsen or seek emergency care  Follow up with Dr. Isaiah SergeMannam in 3 months and As needed

## 2015-03-16 NOTE — Patient Instructions (Addendum)
Zpack take as directed.  Prednisone taper over next week.  Mucinex DM As needed  Cough/congestion  Saline nasal rinses As needed   Continue on Dulera 2 puffs Twice daily  -rinse after use.  Please contact office for sooner follow up if symptoms do not improve or worsen or seek emergency care  Follow up with Dr. Isaiah SergeMannam in 3 months and As needed

## 2015-03-17 LAB — COPPER, SERUM: Copper: 80 ug/dL (ref 72–166)

## 2015-03-17 LAB — RPR: RPR Ser Ql: NONREACTIVE

## 2015-03-17 LAB — METHYLMALONIC ACID, SERUM: METHYLMALONIC ACID: 360 nmol/L (ref 0–378)

## 2015-03-17 LAB — VITAMIN B12: Vitamin B-12: 299 pg/mL (ref 211–946)

## 2015-03-19 ENCOUNTER — Telehealth: Payer: Self-pay | Admitting: Adult Health

## 2015-03-19 ENCOUNTER — Telehealth: Payer: Self-pay

## 2015-03-19 NOTE — Telephone Encounter (Signed)
Called and spoke to pt. Pt stated he has taken 2 days (4 tabs each) of pred 10mg . Pt states this is his 3rd day and he is taking 2 tablets, advised pt that he should take 3 tabs today and not 2. Pt states he will take one more tablet to get back to the regimen. Pt aware to also take 3 tabs tomorrow as well then take 2 tabs for two days, then 1 tab for 2 days. Pt verbalized understanding and denied any further questions or concerns at this time.

## 2015-03-19 NOTE — Telephone Encounter (Signed)
-----   Message from York Spanielharles K Willis, MD sent at 03/18/2015 10:51 AM EDT -----  The blood work results are unremarkable. Please call the patient.  ----- Message -----    From: [facility] Lab Results In Interface    Sent: 03/15/2015   7:43 AM      To: York Spanielharles K Willis, MD

## 2015-03-19 NOTE — Telephone Encounter (Signed)
I called the patient and relayed results. 

## 2015-03-23 ENCOUNTER — Other Ambulatory Visit: Payer: Self-pay | Admitting: Surgery

## 2015-03-23 NOTE — Pre-Procedure Instructions (Signed)
    [name] PattenRoger D [name]  03/23/2015      [id number] [place], [place] - [address] [place] [address] Phone: [phone] Fax: [phone]    Your procedure is scheduled on Tuesday, November 15th, 2016.   [facility]15 [facility].M.   Call this number if you have problems the morning of surgery:  [phone]   Remember:  Do not eat food or drink liquids after midnight.   Take these medicines the morning of surgery with A SIP OF WATER: Albuterol inhaler (please bring with you), Diazepam (Valium), Fluticasone (Flonase), Hydroxyzine if needed, Dulera inhaler, Prednisolone Acetate (pred Forte) eye drop, Prednisone, Sertraline (Zoloft).   Stop taking: Aspirin, NSAIDS, Ibuprofen, Advil, Motrin, Naproxen, Aleve, BC"s, Goody's, Fish oil, all herbal medications, and all vitamins.    Do not wear jewelry.  Do not wear lotions, powders, or colognes.  You may not wear deodorant.  Men may shave face and neck.   Do not bring valuables to the hospital.  [facility] is not [facility] for any belongings or valuables.  Contacts, dentures or bridgework may not be worn into surgery.  Leave your suitcase in the car.  After surgery it may be brought to your room.  For patients admitted to the hospital, discharge time will be determined by your treatment team.  Patients discharged the day of surgery will not be allowed to drive home.   Special instructions:  See attached sheet for instructions on CHG shower   Please read over the following fact sheets that you were given. Pain Booklet, Coughing and Deep Breathing and Surgical Site Infection Prevention

## 2015-03-23 NOTE — H&P (Signed)
[name] [name] 03/23/2015 12:01 PM Location: [facility] Patient #: [id number] DOB: June 06, 1950 Single / Language: [name] [name] / Race: White Male  History of Present Illness [name] Sportsman MD; 03/23/2015 12:30 PM) The patient is a 64 year old male who presents with warts. Patient sent for consultation by Dr. 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 Had perianal warts and genital warts treated in the past. Tried to manage with topical podophyllin. Finally were excised in 2008. He thinks that they weren't all done. He feels that they have come back. Has bowel movement every day. No fevers or chills. UC? Proctitis or colitis in the past. Takes nonsteroidals for pain the past. Thinks he had colonoscopy done on [name] [name] but cannot recall the gastroenterologist. Not recent. On chronic steroids for asthma. Had numerous high problems with surgeries at [facility] and weight force. Legally blind.   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 Hemorrhoidectomy Knee Surgery Right. Open Inguinal Hernia Surgery Left. Oral Surgery Tonsillectomy  Diagnostic Studies History [name] Sportsman, MD; 03/23/2015 12:06 PM) Colonoscopy 1-5 years ago  Allergies [name] [name], CMA; 03/23/2015 12:02 PM) Codeine Sulfate *ANALGESICS - OPIOID* Norco *ANALGESICS - OPIOID* Penicillin G Pot in Dextrose *PENICILLINS* Tape 1"X5yd *MEDICAL DEVICES AND SUPPLIES* Chlorhexidine Acetate  *CHEMICALS*  Medication History [name] [name], CMA; 03/23/2015 12:02 PM) Tamsulosin HCl (0.4MG  Capsule, Oral) Active. Sertraline HCl (  Tablet, Oral) Active. ProAir HFA (108 (90 Base)MCG/ACT Aerosol Soln, Inhalation) Active. PredniSONE (  Tablet, Oral) Active. Azithromycin (  Tablet, Oral) Active. DiazePAM (  Tablet, Oral) Active. Desoximetasone (0.05% Cream, External) Active. Dulera (200-5MCG/ACT Aerosol, Inhalation as needed) Active. Ibuprofen (  Tablet, Oral) Active. PrednisoLONE Acetate (1% Suspension, Ophthalmic) Active. Medications Reconciled  Social History [name] Sportsman, MD; 03/23/2015 12:06 PM) Alcohol use Occasional alcohol use. Caffeine use Carbonated beverages, Coffee, Tea. No drug use Tobacco use Former smoker.  Family History [name] Sportsman, MD; 03/23/2015 12:06 PM) Family history unknown First Degree Relatives Heart Disease Mother.     Review of Systems [name] Sportsman, MD; 03/23/2015 12:6 PM) General Not Present- Appetite Loss, Chills, Fatigue, Fever, Night Sweats, Weight Gain and Weight Loss. Skin Present- Dryness. Not Present- Change in Wart/Mole, Hives, Jaundice, New Lesions, Non-Healing Wounds, Rash and Ulcer. HEENT Present- Seasonal Allergies and Sinus Pain. Not Present- Earache, Hearing Loss, Hoarseness, Nose Bleed, Oral Ulcers, Ringing in the Ears, Sore Throat, Visual Disturbances, Wears glasses/contact lenses and Yellow Eyes. Respiratory Present- Wheezing. Not Present- Bloody sputum, Chronic Cough, Difficulty Breathing and Snoring. Breast Not Present- Breast Mass, Breast Pain, Nipple Discharge and Skin Changes. Cardiovascular Not Present- Chest Pain, Difficulty Breathing Lying Down, Leg Cramps, Palpitations, Rapid Heart Rate, Shortness of Breath and Swelling of Extremities. Gastrointestinal Not Present- Abdominal Pain, Bloating, Bloody Stool, Change in Bowel Habits, Chronic diarrhea, Constipation, Difficulty Swallowing,  Excessive gas, Gets full quickly at meals, Hemorrhoids, Indigestion, Nausea, Rectal Pain and Vomiting. Male Genitourinary Present- Frequency. Not Present- Blood in Urine, Change in Urinary Stream, Impotence, Nocturia, Painful Urination, Urgency and Urine Leakage. Musculoskeletal Present- Back Pain. Not Present- Joint Pain, Joint Stiffness, Muscle Pain, Muscle Weakness and Swelling of Extremities. Neurological Present- Decreased Memory and Trouble walking.  Not Present- Fainting, Headaches, Numbness, Seizures, Tingling, Tremor and Weakness. Psychiatric Present- Anxiety and Depression. Not Present- Bipolar, Change in Sleep Pattern, Fearful and Frequent crying. Endocrine Present- Heat Intolerance. Not Present- Cold Intolerance, Excessive Hunger, Hair Changes and New Diabetes. Hematology Not Present- Easy Bruising, Excessive bleeding, Gland problems, HIV and Persistent Infections.  Vitals [name] [name] CMA; 03/23/2015 12:03 PM) 03/23/2015 12:03 PM Weight: 155 lb Height: 71in Body Surface Area: 1.89 m Body Mass Index: 21.62 kg/m  Temp.: 97.51F(Temporal)  Pulse: 80 (Regular)       Physical Exam [name] Sportsman MD; 03/23/2015 12:27 PM)  General Mental Status-Alert. General Appearance-Not in acute distress, Not Sickly. Orientation-Oriented X3. Hydration-Well hydrated. Voice-Normal. Note: Thin. Poorly groomed.  Integumentary Global Assessment Normal Exam - Axillae: non-tender, no inflammation or ulceration, no drainage. and Distribution of scalp and body hair is normal. General Characteristics Temperature - normal warmth is noted.  Head and Neck Head-normocephalic, atraumatic with no lesions or palpable masses. Face Global Assessment - atraumatic, no absence of expression. Neck Global Assessment - no abnormal movements, no bruit auscultated on the right, no bruit auscultated on the left, no decreased range of motion,  non-tender. Trachea-midline. Thyroid Gland Characteristics - non-tender.  Eye Eyeball - Left-Extraocular movements intact, No Nystagmus. Eyeball - Right-Extraocular movements intact, No Nystagmus. Cornea - Left-No Hazy. Cornea - Right-No Hazy. Sclera/Conjunctiva - Left-No scleral icterus, No Discharge. Sclera/Conjunctiva - Right-No scleral icterus, No Discharge. Pupil - Left-Direct reaction to light normal. Pupil - Right-Direct reaction to light normal. Note: RIGHT eyelid scarred down. LEFT without vision.  ENMT Ears Pinna - Left - no drainage observed, no generalized tenderness observed. Right - no drainage observed, no generalized tenderness observed. Nose and Sinuses Nose - no destructive lesion observed. Nares - Left - quiet respiration. Right - quiet respiration. Mouth and Throat Lips - Upper Lip - no fissures observed, no pallor noted. Lower Lip - no fissures observed, no pallor noted. Nasopharynx - no discharge present. Oral Cavity/Oropharynx - Tongue - no dryness observed. Oral Mucosa - no cyanosis observed. Hypopharynx - no evidence of airway distress observed.  Chest and Lung Exam Inspection Movements - Normal and Symmetrical. Accessory muscles - No use of accessory muscles in breathing. Palpation Normal exam - Non-tender. Auscultation Breath sounds - Normal and Clear.  Cardiovascular Auscultation Rhythm - Regular. Murmurs & Other Heart Sounds - Normal exam - No Murmurs and No Systolic Clicks.  Abdomen Inspection Normal Exam - No Visible peristalsis and No Abnormal pulsations. Umbilicus - No Bleeding, No Urine drainage. Palpation/Percussion Normal exam - Soft, Non Tender, No Rebound tenderness, No Rigidity (guarding) and No Cutaneous hyperesthesia. Note: Soft and flat. No diastases. No umbilical hernia  Male Genitourinary Sexual Maturity Tanner 5 - Adult hair pattern and Adult penile size and shape. Note: Normal external male genitalia.  Circumcised. LEFT testicle somewhat raised consistent with his prior repositioning. Brown patches on the shaft of his penis but no evidence of scarring or recurrent condyloma.  Rectal Note: Perianal skin clear. Left sided external 2-45mm skin masses consistent with persistent/recurrent condyloma. No more lesions on the RIGHT side. No fissure. No fistula. LEFT anterior anal crypt canal smooth without obvious lesions felt this time. No proctitis. Soft stool in vault. Grade 1-2 hemorrhoids. Nothing significant. No prolapse or bleeding. Prostate mildly enlarged but smooth.  Peripheral Vascular Upper Extremity Inspection - Left - No Cyanotic nailbeds, Not Ischemic. Right - No Cyanotic nailbeds, Not Ischemic.  Neurologic Neurologic evaluation reveals -normal attention span and ability to concentrate,  able to name objects and repeat phrases. Appropriate fund of knowledge , normal sensation and normal coordination. Mental Status Affect - not angry, not paranoid. Cranial Nerves-Normal Bilaterally. Gait-Normal.  Neuropsychiatric Mental status exam performed with findings of-able to articulate well with normal speech/language, rate, volume and coherence, thought content normal with ability to perform basic computations and apply abstract reasoning and no evidence of hallucinations, delusions, obsessions or homicidal/suicidal ideation. Note: Less stuttering and tremor today. Remains with good insight . Tends to dictate care. Initially gruff but then more pleasant but the end of the visit.  Musculoskeletal Global Assessment Spine, Ribs and Pelvis - no instability, subluxation or laxity. Right Upper Extremity - no instability, subluxation or laxity.  Lymphatic Head & Neck General Head & Neck Lymphatics: Bilateral - Description - No Localized lymphadenopathy. Axillary General Axillary Region: Bilateral - Description - No Localized lymphadenopathy. Femoral & Inguinal Generalized  Femoral & Inguinal Lymphatics: Left: Right - Description - No Localized lymphadenopathy. Description - No Localized lymphadenopathy.    Assessment & Plan [name] [name](Batya Citron C. 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 Not able to be limited with cryotherapy.  He notes topical therapy usually does not work. He wishes to consider surgery. Reasonable to try since topical therapy failed. We do outpatient CO2 laser ablation surgery.  Current Plans Pt Education - [facility] Anal Warts (AT) The anatomy & physiology of the anorectal region was discussed. The pathophysiology of anorectal warts and differential diagnosis was discussed. Natural history risks without surgery was discussed such as further growth and cancer. I stressed the importance of office follow-up to catch early recurrence & minimize/halt progression of disease. Interventions such as cauterization or cryotherapy by topical agents were discussed.  The patient's symptoms are not adequately controlled by non-operative treatments. I feel the risks & problems of no surgery outweigh the operative risks; therefore, I recommended surgery to treat the anal warts by removal, ablation and/or cauterization.  Risks such as bleeding, infection, need for further treatment, heart attack, death, and other risks were discussed. I noted a good likelihood this will help address the problem. Goals of post-operative recovery were discussed as well. Possibility that this will not correct all symptoms was explained. Post-operative pain, bleeding, constipation, and other problems after surgery were discussed. We will work to minimize complications. Educational handouts further explaining the pathology, treatment options, and bowel regimen were given as well. Questions were answered. The patient expresses understanding & wishes to proceed with surgery.  You are being scheduled for surgery -  Our schedulers will call you.  You should hear from our office's scheduling department within 5 working days about the location, date, and time of surgery. We try to make accommodations for patient's preferences in scheduling surgery, but sometimes the OR schedule or the surgeon's schedule prevents us from making those accommodations.  If you have not heard from our office [phone]) in 5 working days, call the office and ask for your surgeon's nurse.  If you have other questions about your diagnosis, plan, or surgery, call the office and ask for your surgeon's nurse.  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 Sounds like [facility] and Dr. Matthias Hughs had scoped him in the past and recently switched over to Dr. Jeani Hawking with [facility] on [place]. We'll try and get [name] from them. Recommend that is followed. He agrees.  [name] [name], M.D., F.A.C.S. Gastrointestinal and Minimally Invasive Surgery [facility] [address] [place], [place] [address] [phone] Main / Paging

## 2015-03-26 ENCOUNTER — Inpatient Hospital Stay (HOSPITAL_COMMUNITY)
Admission: RE | Admit: 2015-03-26 | Discharge: 2015-03-26 | Disposition: A | Discharge status: Home / Self Care | Payer: Medicaid Other | Source: Ambulatory Visit

## 2015-03-29 ENCOUNTER — Emergency Department (HOSPITAL_COMMUNITY): Payer: Medicaid Other

## 2015-03-29 ENCOUNTER — Telehealth: Payer: Self-pay | Admitting: Pulmonary Disease

## 2015-03-29 ENCOUNTER — Encounter (HOSPITAL_COMMUNITY): Payer: Self-pay | Admitting: Emergency Medicine

## 2015-03-29 ENCOUNTER — Other Ambulatory Visit: Payer: Self-pay

## 2015-03-29 ENCOUNTER — Emergency Department (HOSPITAL_COMMUNITY)
Admission: EM | Admit: 2015-03-29 | Discharge: 2015-03-29 | Disposition: A | Discharge status: Home / Self Care | Payer: Medicaid Other | Attending: Emergency Medicine | Admitting: Emergency Medicine

## 2015-03-29 DIAGNOSIS — Z872 Personal history of diseases of the skin and subcutaneous tissue: Secondary | ICD-10-CM | POA: Diagnosis not present

## 2015-03-29 DIAGNOSIS — J441 Chronic obstructive pulmonary disease with (acute) exacerbation: Secondary | ICD-10-CM | POA: Insufficient documentation

## 2015-03-29 DIAGNOSIS — F329 Major depressive disorder, single episode, unspecified: Secondary | ICD-10-CM | POA: Insufficient documentation

## 2015-03-29 DIAGNOSIS — J449 Chronic obstructive pulmonary disease, unspecified: Secondary | ICD-10-CM

## 2015-03-29 DIAGNOSIS — Z79899 Other long term (current) drug therapy: Secondary | ICD-10-CM | POA: Insufficient documentation

## 2015-03-29 DIAGNOSIS — Z87442 Personal history of urinary calculi: Secondary | ICD-10-CM | POA: Diagnosis not present

## 2015-03-29 DIAGNOSIS — Z880 Allergy status to penicillin: Secondary | ICD-10-CM | POA: Insufficient documentation

## 2015-03-29 DIAGNOSIS — Z8701 Personal history of pneumonia (recurrent): Secondary | ICD-10-CM | POA: Insufficient documentation

## 2015-03-29 DIAGNOSIS — Z7951 Long term (current) use of inhaled steroids: Secondary | ICD-10-CM | POA: Diagnosis not present

## 2015-03-29 DIAGNOSIS — Z8619 Personal history of other infectious and parasitic diseases: Secondary | ICD-10-CM | POA: Insufficient documentation

## 2015-03-29 DIAGNOSIS — K219 Gastro-esophageal reflux disease without esophagitis: Secondary | ICD-10-CM | POA: Diagnosis not present

## 2015-03-29 DIAGNOSIS — G8929 Other chronic pain: Secondary | ICD-10-CM | POA: Insufficient documentation

## 2015-03-29 DIAGNOSIS — I10 Essential (primary) hypertension: Secondary | ICD-10-CM | POA: Insufficient documentation

## 2015-03-29 DIAGNOSIS — Z8781 Personal history of (healed) traumatic fracture: Secondary | ICD-10-CM | POA: Insufficient documentation

## 2015-03-29 DIAGNOSIS — Z87891 Personal history of nicotine dependence: Secondary | ICD-10-CM | POA: Insufficient documentation

## 2015-03-29 DIAGNOSIS — M47816 Spondylosis without myelopathy or radiculopathy, lumbar region: Secondary | ICD-10-CM | POA: Insufficient documentation

## 2015-03-29 DIAGNOSIS — R0602 Shortness of breath: Secondary | ICD-10-CM | POA: Diagnosis present

## 2015-03-29 LAB — BASIC METABOLIC PANEL
ANION GAP: 8 (ref 5–15)
BUN: 18 mg/dL (ref 6–20)
CO2: 25 mmol/L (ref 22–32)
CREATININE: 1.11 mg/dL (ref 0.61–1.24)
Calcium: 9 mg/dL (ref 8.9–10.3)
Chloride: 110 mmol/L (ref 101–111)
GFR calc Af Amer: >60 mL/min (ref >60)
Glucose, Bld: 136 mg/dL — ABNORMAL HIGH (ref 65–99)
POTASSIUM: 3.4 mmol/L — AB (ref 3.5–5.1)
Sodium: 143 mmol/L (ref 135–145)

## 2015-03-29 LAB — CBC
HCT: 40.1 % (ref 39.0–52.0)
Hemoglobin: 13.6 g/dL (ref 13.0–17.0)
MCH: 31.1 pg (ref 26.0–34.0)
MCHC: 33.9 g/dL (ref 30.0–36.0)
MCV: 91.8 fL (ref 78.0–100.0)
PLATELETS: 173 10*3/uL (ref 150–400)
RBC: 4.37 MIL/uL (ref 4.22–5.81)
RDW: 13.1 % (ref 11.5–15.5)
WBC: 11.6 10*3/uL — AB (ref 4.0–10.5)

## 2015-03-29 MED ORDER — DEXAMETHASONE SODIUM PHOSPHATE 10 MG/ML IJ SOLN: 10.0000 mg | Freq: Once | INTRAMUSCULAR | Status: DC

## 2015-03-29 MED ORDER — ACETAMINOPHEN 325 MG PO TABS
650.0000 mg | ORAL_TABLET | Freq: Once | ORAL | Status: AC
  Administered 2015-03-29: 650 mg via ORAL
  Filled 2015-03-29: qty 2

## 2015-03-29 MED ORDER — IPRATROPIUM-ALBUTEROL 0.5-2.5 (3) MG/3ML IN SOLN
3.0000 mL | Freq: Once | RESPIRATORY_TRACT | Status: AC
  Administered 2015-03-29: 3 mL via RESPIRATORY_TRACT
  Filled 2015-03-29: qty 3

## 2015-03-29 MED ORDER — DEXAMETHASONE SODIUM PHOSPHATE 10 MG/ML IJ SOLN
10.0000 mg | Freq: Once | INTRAMUSCULAR | Status: AC
  Administered 2015-03-29: 10 mg via INTRAVENOUS
  Filled 2015-03-29: qty 1

## 2015-03-29 NOTE — Discharge Instructions (Signed)
Read the information below.  You may return to the Emergency Department at any time for worsening condition or any new symptoms that concern you.  If you develop worsening shortness of breath, uncontrolled wheezing, severe chest pain, or fevers despite using tylenol and/or ibuprofen, return for a recheck.      Chronic Obstructive Pulmonary Disease Exacerbation Chronic obstructive pulmonary disease (COPD) is a common lung problem. In COPD, the flow of air from the lungs is limited. COPD exacerbations are times that breathing gets worse and you need extra treatment. Without treatment they can be life threatening. If they happen often, your lungs can become more damaged. If your COPD gets worse, your doctor may treat you with:  Medicines.  Oxygen.  Different ways to clear your airway, such as using a mask. HOME CARE  Do not smoke.  Avoid tobacco smoke and other things that bother your lungs.  If given, take your antibiotic medicine as told. Finish the medicine even if you start to feel better.  Only take medicines as told by your doctor.  Drink enough fluids to keep your pee (urine) clear or pale yellow (unless your doctor has told you not to).  Use a cool mist machine (vaporizer).  If you use oxygen or a machine that turns liquid medicine into a mist (nebulizer), continue to use them as told.  Keep up with shots (vaccinations) as told by your doctor.  Exercise regularly.  Eat healthy foods.  Keep all doctor visits as told. GET HELP RIGHT AWAY IF:  You are very short of breath and it gets worse.  You have trouble talking.  You have bad chest pain.  You have blood in your spit (sputum).  You have a fever.  You keep throwing up (vomiting).  You feel weak, or you pass out (faint).  You feel confused.  You keep getting worse. MAKE SURE YOU:  Understand these instructions.  Will watch your condition.  Will get help right away if you are not doing well or get worse.   This information is not intended to replace advice given to you by your health care provider. Make sure you discuss any questions you have with your health care provider.   Document Released: 04/24/2011 Document Revised: 05/26/2014 Document Reviewed: 01/07/2013 Elsevier Interactive Patient Education [employer]2016 [employer]vier Inc.

## 2015-03-29 NOTE — ED Notes (Signed)
Patient moved to hall bed and prepared for discharge.  Patient stated he felt more shortness of breath and tigh - PA notified and breathing treatment ordered.

## 2015-03-29 NOTE — ED Notes (Signed)
Patient transported to X-ray 

## 2015-03-29 NOTE — ED Notes (Signed)
Bed: [id number] Expected date:  Expected time:  Means of arrival:  Comments: Ems-sob

## 2015-03-29 NOTE — Telephone Encounter (Signed)
OK with more prednisone. Start at 60 mg. Reduce by 10 mg every 3 days. Can you make a sooner appointment with me. He need to go to ED and get admitted if not symptoms worsen.

## 2015-03-29 NOTE — ED Notes (Signed)
[facility] called for patient transport 

## 2015-03-29 NOTE — ED Notes (Signed)
Per EMS pt with Hx of COPD c/o SOB onset 3 hours ago, abdominal breathing, accessory muscle use, wheezing all fields. Pt states he stopped a prednisone regimen 3 days ago. Pt administered 3 breathing tx at home. EMS administered duoneb, solu-medrol, mag sulfate prior to arrival. Magnesium sulfate improved condition.

## 2015-03-29 NOTE — Telephone Encounter (Signed)
Spoke with pt. States that he has been having lots more wheezing and SOB. Saw TP a few weeks ago for the same issues. Wants some answers about what to do from here. Does he need more prednisone?  Dr. Isaiah SergeMannam please advise. Thanks.

## 2015-03-29 NOTE — Telephone Encounter (Signed)
[facility] that [facility] He does not want the prednisone sent in. Nothing further was needed.

## 2015-03-29 NOTE — ED Provider Notes (Signed)
CSN: [id number]     Arrival date & time 03/29/15  1729 History   First MD Initiated Contact with Patient 03/29/15 1747     Chief Complaint  Patient presents with  . Shortness of Breath     (Consider location/radiation/quality/duration/timing/severity/associated sxs/prior Treatment) The history is provided by the patient.     Pt with hx COPD p/w SOB and wheezing that has been uncontrolled for several weeks, has been on two rounds of prednisone from his pulmonologist, last pill was 3 days ago.  States his SOB and wheezing has increased since then and has been particularly bad today.  Has mostly dry cough with occasional thick clear sputum.  States this all feels like his typical uncontrolled COPD.  Use 3 neb treatments at home without improvement.  Was given duoneb, solu-medrol, and mag sulfate by EMS with great improvement.  Denies fever, chest pain, leg swelling.    Past Medical History  Diagnosis Date  . 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 Cough   . Esophageal reflux   . Osteopenia   . Personal history of other diseases of digestive system   . Contact dermatitis and other eczema, due to unspecified cause   . Unspecified sinusitis (chronic)   . Allergic rhinitis   . Unspecified asthma(493.90)   . COPD (chronic obstructive pulmonary disease) (HCC)   . Hypertension   . Seasonal allergies   . Ulcerative colitis   . H/O hiatal hernia   . Arthritis     spine  . Anxiety   . Multiple open wounds     "from esczema- scratching"  . Multiple facial bone fractures (HCC)   . Hep C w/o coma, chronic (HCC) early 2013    "possible"  . History of bronchitis   . Pneumonia     in past had several times  . Shortness of breath     "sometime"  . Chronic lower back pain   . Kidney stones   . Depression     patient constantly hitting left side of face "due to pain"  . Pancreatitis   . Headache     PMH: migraines   Past Surgical History  Procedure Laterality  Date  . Litrotripsy    . Orif distal radius fracture  09/12/11    right  . Fracture surgery    . Metacarpal pinning      right hand  . Patella fracture surgery      right  . Tonsillectomy and adenoidectomy    . Eye surgery      3 Corneal transplant (bilateral eyes)  . Hardware removal  10/09/2011    Procedure: HARDWARE REMOVAL;  Surgeon: Nadara Mustard, MD;  Location: [facility] OR;  Service: Orthopedics;  Laterality: Right;  Removal Deep Hardware Right Wrist, placement antibiotic beads.  . Orif wrist fracture Right 06/15/2013    Procedure: RIGHT RADIUS PSTEOTOMY, DISTAL ULNA RESECTION, AND DIGITAL FLEXOR LENGTHENING AS NEEDED;  Surgeon: Marlowe Shores, MD;  Location: [facility];  Service: Orthopedics;  Laterality: Right;  . Carpal tunnel release Right 06/15/2013    Procedure: RIGHT CARPAL TUNNEL RELEASE;  Surgeon: Marlowe Shores, MD;  Location: [facility];  Service: Orthopedics;  Laterality: Right;  . Inguinal hernia repair    . Colonoscopy w/ biopsies and polypectomy    . Radiology with anesthesia N/A 03/09/2014    Procedure: MRI - Cervical and Lumbar without Contrast;  Surgeon: Medication Radiologist,  MD;  Location: [facility] OR;  Service: Radiology;  Laterality: N/A;   Family History  Problem Relation Age of Onset  . Allergies Mother     atopic  . Asthma Mother   . Heart failure Mother     CHF  . Other Father   . Anesthesia problems Neg Hx    Social History  Substance Use Topics  . Smoking status: Former Smoker -- 1.00 packs/day for 3 years    Types: Cigarettes, Pipe    Quit date: 05/20/1983  . Smokeless tobacco: Never Used  . Alcohol Use: Yes     Comment: "scotch once a year"    Review of Systems  All other systems reviewed and are negative.     Allergies  Bacitracin-polymyxin b; Bee venom; Codeine; Norco; Penicillins; Tape; Sulfate; and Chlorhexidine  Home Medications   Prior to Admission medications   Medication Sig Start Date End Date  Taking? Authorizing Provider  albuterol (PROVENTIL HFA;VENTOLIN HFA) 108 (90 BASE) MCG/ACT inhaler Inhale 2 puffs into the lungs every 6 (six) hours as needed for wheezing or shortness of breath (wheezing & shortness of breath). 06/11/14   Azalia Bilis, MD  cholecalciferol (VITAMIN D) 1000 UNITS tablet Take 1,000 Units by mouth daily.    Historical Provider, MD  diazepam (VALIUM) 10 MG tablet Take 1 tablet (10 mg total) by mouth 3 (three) times daily. 06/06/14   Osvaldo Shipper, MD  fluticasone (FLONASE) 50 MCG/ACT nasal spray USE 2 SPRAYS IN EACH NOSTRIL TWICE DAILY. 10/17/14   Storm Frisk, MD  guaiFENesin (MUCINEX) 600 MG 12 hr tablet Take 1 tablet (600 mg total) by mouth 2 (two) times daily. 06/06/14   Osvaldo Shipper, MD  hydrOXYzine (ATARAX/VISTARIL) 25 MG tablet Take 1 tablet (25 mg total) by mouth 3 (three) times daily as needed for itching (itching). 06/11/14   Azalia Bilis, MD  ibuprofen (ADVIL,MOTRIN) 200 MG tablet Take 600 mg by mouth every 6 (six) hours as needed for moderate pain.    Historical Provider, MD  mometasone-formoterol (DULERA) 200-5 MCG/ACT AERO Inhale 2 puffs into the lungs 2 (two) times daily. 06/11/14   Azalia Bilis, MD  montelukast (SINGULAIR) 10 MG tablet Take 1 tablet (10 mg total) by mouth at bedtime. 08/09/14   Tammy S Parrett, NP  prednisoLONE acetate (PRED FORTE) 1 % ophthalmic suspension Place 1 drop into both eyes daily.     Historical Provider, MD  predniSONE (DELTASONE) 10 MG tablet 4 tabs for 2 days, then 3 tabs for 2 days, 2 tabs for 2 days, then 1 tab for 2 days, then stop 03/16/15   Tammy S Parrett, NP  sertraline (ZOLOFT) 100 MG tablet Take 2 tablets (200 mg total) by mouth daily. 06/11/14   Azalia Bilis, MD  tamsulosin (FLOMAX) 0.4 MG CAPS capsule Take 0.4 mg by mouth daily after supper.    Historical Provider, MD  temazepam (RESTORIL) 15 MG capsule Take 15 mg by mouth at bedtime as needed for sleep.    Historical Provider, MD  traZODone (DESYREL) 100 MG tablet  Take 100 mg by mouth at bedtime as needed for sleep.     Historical Provider, MD   BP 137/86 mmHg  Pulse 118  Temp(Src) 98.1 F (36.7 C) (Oral)  Resp 20  SpO2 94% Physical Exam  Constitutional: He appears well-developed and well-nourished. No distress.  HENT:  Head: Normocephalic and atraumatic.  Neck: Neck supple.  Cardiovascular: Normal rate and regular rhythm.   Pulmonary/Chest: Effort normal. No respiratory distress. He  has wheezes. He has no rales.  Abdominal: Soft. He exhibits no distension and no mass. There is no tenderness. There is no rebound and no guarding.  Musculoskeletal: He exhibits no edema.  Neurological: He is alert. He exhibits normal muscle tone.  Skin: He is not diaphoretic.  Nursing note and vitals reviewed.   ED Course  Procedures (including critical care time) Labs Review Labs Reviewed  BASIC METABOLIC PANEL - Abnormal; Notable for the following:    Potassium 3.4 (*)    Glucose, Bld 136 (*)    All other components within normal limits  CBC - Abnormal; Notable for the following:    WBC 11.6 (*)    All other components within normal limits    Imaging Review Dg Chest 2 View  03/29/2015  CLINICAL DATA:  History of COPD with onset shortness of breath 3 hours ago. EXAM: CHEST  2 VIEW COMPARISON:  January 25, 2015 FINDINGS: The heart size and mediastinal contours are stable. The tortuous aorta is unchanged. There is no focal infiltrate, pulmonary edema, or pleural effusion. There is mild scarring of the left lung base unchanged. The visualized skeletal structures are unremarkable. IMPRESSION: No active cardiopulmonary disease. Electronically Signed   By: Sherian ReinWei-Chen  Lin M.D.   On: 03/29/2015 18:12   I have personally reviewed and evaluated these images and lab results as part of my medical decision-making.   EKG Interpretation None      6:36 PM  Pt currently comfortable.  Declines current breathing treatment.    8:03 PM Pt still declines breathing  treatment.  Ready for d/c home.  Ambulates in hallway, maintains O2 95%.     MDM   Final diagnoses:  Chronic obstructive pulmonary disease, unspecified COPD type (HCC)    Afebrile, nontoxic patient with COPD, worsening symptoms today.  Given multiple medications by EMS with great improvement.  Pt declined further medications while in ED until discharge when he requested another breathing treatment.   Improved quickly with these medications, has residual wheezing on exam but moving air well in all fields.  Pt has just been on two rounds of steroids, have given decadron and defer to pulmonology for decision regarding restarting these.   Labs significant for mild leukocytosis (on prednisone).  CXR negative.  D/C home with pulmonology follow up.  Discussed result, findings, treatment, and follow up  with patient.  Pt given return precautions.  Pt verbalizes understanding and agrees with plan.        Trixie DredgeEmily Tredarius Cobern, PA-C 03/30/15 0041  Geoffery Lyonsouglas Delo, MD 04/02/15 [phone]

## 2015-03-29 NOTE — ED Notes (Signed)
Patient ambulated in hallway on continuous pulse ox.  Patient maintained oxygen saturation between 93-95% while ambulating.  Pt denied any worsening symptoms.  PA notified.

## 2015-03-29 NOTE — ED Notes (Signed)
[facility] at bedside to transport patient 

## 2015-03-30 ENCOUNTER — Inpatient Hospital Stay (HOSPITAL_COMMUNITY)
Admission: RE | Admit: 2015-03-30 | Discharge: 2015-03-30 | Disposition: A | Discharge status: Home / Self Care | Payer: Medicaid Other | Source: Ambulatory Visit

## 2015-03-30 ENCOUNTER — Telehealth: Payer: Self-pay | Admitting: Pulmonary Disease

## 2015-03-30 NOTE — Telephone Encounter (Signed)
Spoke with pt. States that he went to the ED yesterday for increased wheezing and SOB. He is not feeling any better since leaving the ED. Was given 2 injections while there but he doesn't know what they gave him. Offered to send in the prednisone taper that Dr. Isaiah SergeMannam suggested yesterday, he declined. Wants an appointment next week. He has to give transportation 3 days notice. Spoke with TP >> can use held spot on 04/04/15 at 11:15am. Pt has been scheduled. He will call us if there is any issue with transportation. Nothing further was needed.

## 2015-03-30 NOTE — Progress Notes (Signed)
Patient in ed 03/29/15( last nite). Breathing problems. Unable to come for preadmit appt

## 2015-03-30 NOTE — Pre-Procedure Instructions (Signed)
    Alfonso PattenRoger D Oelkers  03/30/2015      [id number] [place]- [place], [place] - [address]  [place] [id number] Phone: [phone] Fax: [phone]    Your procedure is scheduled on Tuesday, November 15th, 2016.   [facility] 6:15 [facility].M.   Call this number if you have problems the morning of surgery:  [phone]   Remember:  Do not eat food or drink liquids after midnight.   Take these medicines the morning of surgery with A SIP OF WATER: Albuterol inhaler (please bring with you), Diazepam (Valium), Fluticasone (Flonase), Hydroxyzine if needed, Dulera inhaler, Prednisolone Acetate (pred Forte) eye drop, Prednisone, Sertraline (Zoloft).   Stop taking: Aspirin, NSAIDS, Ibuprofen, Advil, Motrin, Naproxen, Aleve, BC"s, Goody's, Fish oil, all herbal medications, and all vitamins.    Do not wear jewelry.  Do not wear lotions, powders, or colognes.  You may not wear deodorant.  Men may shave face and neck.   Do not bring valuables to the hospital.  [facility] is not responsible for any belongings or valuables.  Contacts, dentures or bridgework may not be worn into surgery.  Leave your suitcase in the car.  After surgery it may be brought to your room.  For patients admitted to the hospital, discharge time will be determined by your treatment team.  Patients discharged the day of surgery will not be allowed to drive home.   Special instructions:  See attached sheet for instructions on CHG shower   Please read over the following fact sheets that you were given. Pain Booklet, Coughing and Deep Breathing and Surgical Site Infection Prevention

## 2015-04-02 ENCOUNTER — Encounter (HOSPITAL_COMMUNITY): Payer: Self-pay | Admitting: *Deleted

## 2015-04-02 NOTE — Progress Notes (Signed)
Pt was able to get transportation for in the morning. He denies cardiac history and chest pain. Was recently treated in the ED on 03/29/15 for Wheezing, sob and cough. He states he is much better now, coughing up some clear thick mucus. No difficulty speaking noted.  Pt does not have anyone to stay with him for the 24 hours after anesthesia. He is travelling by [employer].

## 2015-04-02 NOTE — Progress Notes (Signed)
Called pt for pre-op call, he states he is unaware that he is scheduled for a MRI tomorrow, he states he thought it had been cancelled. He couldn't tell me exactly when it had been scheduled for earlier, he was a no show for PAT on Friday. He's not sure if he will be able to make it tomorrow because [facility] and usually has to give them 3 days advance notice. I gave him the time of arrival and instructed him to call Social Services now to see if they can get him here at that time. If not, I instructed him to call Dr. Anne HahnWillis' office to cancel the MRI under anesthesia for tomorrow and also call me back to let me know and I would then go over his instructions with him.

## 2015-04-03 ENCOUNTER — Ambulatory Visit (HOSPITAL_COMMUNITY): Admission: RE | Admit: 2015-04-03 | Payer: Medicaid Other | Source: Ambulatory Visit

## 2015-04-03 ENCOUNTER — Telehealth: Payer: Self-pay | Admitting: Neurology

## 2015-04-03 ENCOUNTER — Encounter (HOSPITAL_COMMUNITY): Payer: Self-pay | Admitting: Certified Registered"

## 2015-04-03 ENCOUNTER — Ambulatory Visit (HOSPITAL_COMMUNITY)
Admission: RE | Admit: 2015-04-03 | Discharge: 2015-04-03 | Disposition: A | Discharge status: Home / Self Care | Payer: Medicaid Other | Source: Ambulatory Visit | Attending: Neurology | Admitting: Neurology

## 2015-04-03 ENCOUNTER — Encounter (HOSPITAL_COMMUNITY)
Admission: RE | Disposition: A | Discharge status: Home / Self Care | Payer: Self-pay | Source: Ambulatory Visit | Attending: Neurology

## 2015-04-03 ENCOUNTER — Ambulatory Visit (HOSPITAL_COMMUNITY): Payer: Medicaid Other | Admitting: Certified Registered"

## 2015-04-03 DIAGNOSIS — J45909 Unspecified asthma, uncomplicated: Secondary | ICD-10-CM | POA: Insufficient documentation

## 2015-04-03 DIAGNOSIS — I10 Essential (primary) hypertension: Secondary | ICD-10-CM | POA: Insufficient documentation

## 2015-04-03 DIAGNOSIS — R413 Other amnesia: Secondary | ICD-10-CM | POA: Diagnosis not present

## 2015-04-03 DIAGNOSIS — J449 Chronic obstructive pulmonary disease, unspecified: Secondary | ICD-10-CM | POA: Insufficient documentation

## 2015-04-03 DIAGNOSIS — Z538 Procedure and treatment not carried out for other reasons: Secondary | ICD-10-CM | POA: Diagnosis not present

## 2015-04-03 DIAGNOSIS — Z87891 Personal history of nicotine dependence: Secondary | ICD-10-CM | POA: Diagnosis not present

## 2015-04-03 DIAGNOSIS — R269 Unspecified abnormalities of gait and mobility: Secondary | ICD-10-CM | POA: Diagnosis not present

## 2015-04-03 HISTORY — DX: Benign prostatic hyperplasia without lower urinary tract symptoms: N40.0

## 2015-04-03 HISTORY — PX: RADIOLOGY WITH ANESTHESIA: SHX6223

## 2015-04-03 LAB — COMPREHENSIVE METABOLIC PANEL
ALT: 17 U/L (ref 17–63)
AST: 17 U/L (ref 15–41)
Albumin: 3.6 g/dL (ref 3.5–5.0)
Alkaline Phosphatase: 94 U/L (ref 38–126)
Anion gap: 8 (ref 5–15)
BILIRUBIN TOTAL: 0.6 mg/dL (ref 0.3–1.2)
BUN: 18 mg/dL (ref 6–20)
CALCIUM: 8.9 mg/dL (ref 8.9–10.3)
CO2: 23 mmol/L (ref 22–32)
CREATININE: 1.39 mg/dL — AB (ref 0.61–1.24)
Chloride: 109 mmol/L (ref 101–111)
GFR calc Af Amer: >60 mL/min (ref >60)
GFR, EST NON AFRICAN AMERICAN: 52 mL/min — AB (ref >60)
Glucose, Bld: 95 mg/dL (ref 65–99)
POTASSIUM: 3.6 mmol/L (ref 3.5–5.1)
Sodium: 140 mmol/L (ref 135–145)
TOTAL PROTEIN: 6.3 g/dL — AB (ref 6.5–8.1)

## 2015-04-03 SURGERY — RADIOLOGY WITH ANESTHESIA
Anesthesia: General

## 2015-04-03 MED ORDER — LACTATED RINGERS IV SOLN
INTRAVENOUS | Status: DC
  Administered 2015-04-03: 07:00:00 via INTRAVENOUS

## 2015-04-03 NOTE — Progress Notes (Signed)
CSW arranged for patient transport home via [facility].  Gerrie NordmannMichelle Barrett-Hilton, LCSW [phone]

## 2015-04-03 NOTE — Progress Notes (Signed)
Patient notified that MRI was cancelled. Attempted to call number listed for transportation no answer. Spoke to [place] in Care Management for assistance. Durward MallardCamille called Marcelino DusterMichelle in social work regarding same. Spoke to [place] she will arrange transportation. Charge RN made aware.

## 2015-04-03 NOTE — Telephone Encounter (Signed)
The patient comes in today for MRI of the brain under anesthesia. He came alone, he has nobody to stay with him for 24 hours after the study. We will have to cancel the MRI. He has already had a CT scan of the brain, we'll be unable to get any further studies.

## 2015-04-03 NOTE — Progress Notes (Signed)
At the direction of Dr. Ivin Bootyrews I spoke to Dr. Anne HahnWillis about admission post MRI due to anesthesia. Per Dr. Anne HahnWillis cancel MRI and he will arrange for alternate testing.

## 2015-04-03 NOTE — Progress Notes (Signed)
Pt. Doesn't have planned ride home, & doesn't have anyone to be with him for 24 hr. post d/c to home.- Dr. Ivin Bootyrews aware.

## 2015-04-03 NOTE — Telephone Encounter (Signed)
The answering service called to relay a message from [place] [facility]:  Patient needs to be admitted after his MRI.  Robert's number is [phone].  Dr. Anne HahnWillis was sent a skype message and was already aware and had [facility].

## 2015-04-03 NOTE — Anesthesia Preprocedure Evaluation (Deleted)
Anesthesia Evaluation  Patient identified by MRN, date of birth, ID band Patient awake    Reviewed: Allergy & Precautions, NPO status , Patient's Chart, lab work & pertinent test results  Airway Mallampati: I  TM Distance: >3 FB Neck ROM: Full    Dental  (+) Edentulous Upper, Edentulous Lower   Pulmonary asthma , COPD, former smoker,    breath sounds clear to auscultation       Cardiovascular hypertension, Pt. on medications  Rhythm:Regular Rate:Normal     Neuro/Psych    GI/Hepatic hiatal hernia, GERD  ,(+) Hepatitis -, C  Endo/Other    Renal/GU      Musculoskeletal   Abdominal   Peds  Hematology   Anesthesia Other Findings   Reproductive/Obstetrics                             Anesthesia Physical Anesthesia Plan  ASA: III  Anesthesia Plan: General   Post-op Pain Management:    Induction: Intravenous  Airway Management Planned: LMA  Additional Equipment:   Intra-op Plan:   Post-operative Plan: Extubation in OR  Informed Consent: I have reviewed the patients History and Physical, chart, labs and discussed the procedure including the risks, benefits and alternatives for the proposed anesthesia with the patient or authorized representative who has indicated his/her understanding and acceptance.   Dental advisory given  Plan Discussed with: CRNA, Anesthesiologist and Surgeon  Anesthesia Plan Comments: (Pt currently has no person to stay with him after anesthesia.  We will pursue his gaining admission overnight instead.)        Anesthesia Quick Evaluation

## 2015-04-04 ENCOUNTER — Ambulatory Visit: Payer: Medicaid Other | Admitting: Adult Health

## 2015-04-04 ENCOUNTER — Encounter (HOSPITAL_COMMUNITY): Payer: Self-pay | Admitting: Radiology

## 2015-04-09 ENCOUNTER — Telehealth: Payer: Self-pay | Admitting: Pulmonary Disease

## 2015-04-09 NOTE — Telephone Encounter (Signed)
Patient coughing and wheezing.  Not getting any better, requesting to be seen before holidays.   Scheduled to see TP at 4pm tomorrow. Patient notified. Nothing further needed. Closing encounter

## 2015-04-10 ENCOUNTER — Ambulatory Visit: Payer: Medicaid Other | Admitting: Adult Health

## 2015-04-11 NOTE — Telephone Encounter (Signed)
I called the patient. We could not get the MRI as someone needs to be with him 24 hours afterward. If he is able to get someone, he is to call me, and I will reschedule.

## 2015-04-11 NOTE — Telephone Encounter (Signed)
I called the patient. He is very upset about not having the MRI done. He would like to know what the plan is going forward. I advised the patient that Dr. Anne HahnWillis will not be in the office until Monday. I advised that we will call him back then.

## 2015-04-11 NOTE — Telephone Encounter (Addendum)
Pt called would like to know if he still needs to come in for his appt in February. Please call and advise [phone]

## 2015-04-17 ENCOUNTER — Other Ambulatory Visit: Payer: Self-pay | Admitting: *Deleted

## 2015-04-17 ENCOUNTER — Telehealth: Payer: Self-pay | Admitting: Pulmonary Disease

## 2015-04-17 MED ORDER — ALBUTEROL SULFATE (2.5 MG/3ML) 0.083% IN NEBU: 2.5000 mg | INHALATION_SOLUTION | RESPIRATORY_TRACT | Status: DC | PRN

## 2015-04-17 NOTE — Telephone Encounter (Signed)
Received refill request for pt albuterol neb solution 1 vial q4hrs prn I do not see this on current med list. Last time refilled was 2014. TP, please advise if okay to refill? thanks

## 2015-04-17 NOTE — Telephone Encounter (Signed)
Yes that is fine

## 2015-04-17 NOTE — Telephone Encounter (Signed)
I have faxed refill request in.

## 2015-04-17 NOTE — Telephone Encounter (Signed)
RX has been called in already. Pt is aware. Nothing further needed

## 2015-04-18 ENCOUNTER — Ambulatory Visit: Payer: Medicaid Other | Admitting: Pulmonary Disease

## 2015-04-20 ENCOUNTER — Encounter: Payer: Self-pay | Admitting: Internal Medicine

## 2015-04-20 ENCOUNTER — Ambulatory Visit (INDEPENDENT_AMBULATORY_CARE_PROVIDER_SITE_OTHER): Payer: Medicaid Other | Admitting: Internal Medicine

## 2015-04-20 VITALS — BP 138/88 | HR 90 | Ht 68.0 in | Wt 148.4 lb

## 2015-04-20 DIAGNOSIS — J45909 Unspecified asthma, uncomplicated: Secondary | ICD-10-CM | POA: Diagnosis not present

## 2015-04-20 MED ORDER — PREDNISONE 10 MG PO TABS: ORAL_TABLET | ORAL | Status: DC

## 2015-04-20 MED ORDER — METHYLPREDNISOLONE ACETATE 80 MG/ML IJ SUSP
120.0000 mg | Freq: Once | INTRAMUSCULAR | Status: AC
  Administered 2015-04-20: 120 mg via INTRAMUSCULAR

## 2015-04-20 NOTE — Progress Notes (Signed)
Subjective:    Patient ID: [name] [name], male    DOB: 10/29/1950 .   MRN: [id number]  Brief patient profile:  63 yowm quit smoking in 1985  Dx Severe persistent asthma steroid dependent significant atopic features previously followed by Dr Delford Field    03/16/2015 NP Acute OV  Pt presents for an acute office visit.  Complains of increased SOB x4 weeks of tightness, wheezing, runny nose w/ PND and sore throat.  Denies any any cough, f/c/s, n/v/d or hemoptysis  Called in pred taper 1 month ago with only minmal improvement.  Not taking otc meds for treatment.  His partner died recently , support provided.  rec Zpack take as directed.  Prednisone taper over next week.  Mucinex DM As needed  Cough/congestion  Saline nasal rinses As needed   Continue on Dulera 2 puffs Twice daily  -rinse after use.    04/20/2015  f/u ov/Lynanne Delgreco re: severe persistent asthma / dulera 200 /singulair/ multiple forms of saba  Chief Complaint  Patient presents with  . Follow-up    Pt c/o increased cough, worse in the morning, with clear, thick mucus, wheeze and SOB. Pt has been using albuterol HFA but it has not been working well.     Not clear he's able to see well enough to tell the difference between inhalers but certainly can't read instructions on bottles  Gradually worse cough/sob since last round of prednisone  No obvious day to day or daytime variability or assoc chronic cough or cp or chest tightness, subjective wheeze or overt sinus or hb symptoms. No unusual exp hx or h/o childhood pna/ asthma or knowledge of premature birth.  Sleeping ok without nocturnal  or early am exacerbation  of respiratory  c/o's or need for noct saba. Also denies any obvious fluctuation of symptoms with weather or environmental changes or other aggravating or alleviating factors except as outlined above   Current Medications, Allergies, Complete Past Medical History, Past Surgical History, Family History, and Social History  were reviewed in [employer] record.  ROS  The following are not active complaints unless bolded sore throat, dysphagia, dental problems, itching, sneezing,  nasal congestion or excess/ purulent secretions, ear ache,   fever, chills, sweats, unintended wt loss, classically pleuritic or exertional cp, hemoptysis,  orthopnea pnd or leg swelling, presyncope, palpitations, abdominal pain, anorexia, nausea, vomiting, diarrhea  or change in bowel or bladder habits, change in stools or urine, dysuria,hematuria,  rash, arthralgias, visual complaints, headache, numbness, weakness or ataxia or problems with walking or coordination,  change in mood/affect or memory.            Objective:   Physical Exam  GEN: A/Ox3; pleasant , thin, blind , walks with walking stick   Wt Readings from Last 3 Encounters:  04/20/15 148 lb 6.4 oz (67.314 kg)  04/03/15 145 lb (65.772 kg)  03/16/15 145 lb (65.772 kg)    Vital signs reviewed   HEENT:  [place]/AT,  EACs-clear, TMs-wnl, NOSE-clear drainage  THROAT-clear, no lesions, no postnasal drip or exudate noted.   NECK:  Supple w/ fair ROM; no JVD; normal carotid impulses w/o bruits; no thyromegaly or nodules palpated; no lymphadenopathy.  RESP  Faint late  exp wheezing wheezing bilaterally c  no accessory muscle use, no dullness to percussion, talking in full sentences   CARD:  RRR, no m/r/g  , no peripheral edema, pulses intact, no cyanosis or clubbing.  GI:   Soft & nt; nml  bowel sounds; no organomegaly or masses detected.  Musco: Warm bil, no deformities or joint swelling noted.   Neuro: alert, blind   Skin: Warm,  No rash     I personally reviewed images and agree with radiology impression as follows:  CXR:  03/29/15 No active cardiopulmonary disease.      Assessment & Plan:   Outpatient Encounter Prescriptions as of 04/20/2015  Medication Sig  . albuterol (PROVENTIL HFA;VENTOLIN HFA) 108 (90 BASE) MCG/ACT inhaler Inhale 2 puffs  into the lungs every 6 (six) hours as needed for wheezing or shortness of breath (wheezing & shortness of breath).  Marland Kitchen. albuterol (PROVENTIL) (2.5 MG/3ML) 0.083% nebulizer solution Take 3 mLs (2.5 mg total) by nebulization every 4 (four) hours as needed for wheezing or shortness of breath.  . Aspirin-Salicylamide-Caffeine (BC HEADACHE PO) Take 2 packets by mouth daily as needed.  . calcium-vitamin D (CALCIUM 500/D) 500-200 MG-UNIT tablet Take 1 tablet by mouth daily.  . diazepam (VALIUM) 10 MG tablet Take 1 tablet (10 mg total) by mouth 3 (three) times daily.  Marland Kitchen. EPINEPHrine (EPIPEN 2-PAK) 0.3 mg/0.3 mL IJ SOAJ injection Inject 0.3 mg into the muscle once.  . fluticasone (FLONASE) 50 MCG/ACT nasal spray USE 2 SPRAYS IN EACH NOSTRIL TWICE DAILY.  Marland Kitchen. guaiFENesin (MUCINEX) 600 MG 12 hr tablet Take 1 tablet (600 mg total) by mouth 2 (two) times daily.  . hydrOXYzine (ATARAX/VISTARIL) 25 MG tablet Take 1 tablet (25 mg total) by mouth 3 (three) times daily as needed for itching (itching).  Marland Kitchen. ibuprofen (ADVIL,MOTRIN) 600 MG tablet Take 600 mg by mouth 2 (two) times daily as needed for headache or moderate pain.  . mometasone-formoterol (DULERA) 200-5 MCG/ACT AERO Inhale 2 puffs into the lungs 2 (two) times daily.  . montelukast (SINGULAIR) 10 MG tablet Take 1 tablet (10 mg total) by mouth at bedtime.  . prednisoLONE acetate (PRED FORTE) 1 % ophthalmic suspension Place 1 drop into both eyes daily.   . sertraline (ZOLOFT) 100 MG tablet Take 2 tablets (200 mg total) by mouth daily.  . tamsulosin (FLOMAX) 0.4 MG CAPS capsule Take 0.4 mg by mouth daily after supper.  . temazepam (RESTORIL) 15 MG capsule Take 15 mg by mouth at bedtime as needed for sleep.  . traZODone (DESYREL) 100 MG tablet Take 100 mg by mouth at bedtime as needed for sleep.   . predniSONE (DELTASONE) 10 MG tablet Take  4 each am x 2 days,   2 each am x 2 days,  1 each am x 2 days and stop  . [EXPIRED] methylPREDNISolone acetate (DEPO-MEDROL)  injection 120 mg    No facility-administered encounter medications on file as of 04/20/2015.

## 2015-04-20 NOTE — Patient Instructions (Addendum)
Automatic = Dulera 200 Take 2 puffs first thing in am and then another 2 puffs about 12 hours later.   Backup =  Proair 2 every 4 hours as needed - goal less than twice a week   Crisis =  Only use albuterol if your proair doesn't work - ok to use up to every 4 hours  depomedrol 120 mg IM today  Prednisone 10 mg take  4 each am x 2 days,   2 each am x 2 days,  1 each am x 2 days and stop   With flares of respiratory symptoms and or need for prednisone >  Try prilosec otc 20mg   Take 30-60 min before first meal of the day and Pepcid ac (famotidine) 20 mg one @  bedtime until cough is completely gone for at least a week without the need for cough suppression  Keep appt with Dr Isaiah SergeMannam - call sooner if needed

## 2015-04-22 ENCOUNTER — Encounter: Payer: Self-pay | Admitting: Internal Medicine

## 2015-04-22 NOTE — Assessment & Plan Note (Addendum)
Long term severe persistent asthma, steroid dependent, severe atopy - Spirometry s saba 05/31/14  FEV1  1.41(38%) ratio 60   DDX of  difficult airways management all start with A and  include Adherence, Ace Inhibitors, Acid Reflux, Active Sinus Disease, Alpha 1 Antitripsin deficiency, Anxiety masquerading as Airways dz,  ABPA,  allergy(esp in young), Aspiration (esp in elderly), Adverse effects of meds,  Active smokers, A bunch of PE's (a small clot burden can't cause this syndrome unless there is already severe underlying pulm or vascular dz with poor reserve) plus two Bs  = Bronchiectasis and Beta blocker use..and one C= CHF  Adherence is always the initial "prime suspect" and is a multilayered concern that requires a "trust but verify" approach in every patient - starting with knowing how to use medications, especially inhalers, correctly, keeping up with refills and understanding the fundamental difference between maintenance and prns vs those medications only taken for a very short course and then stopped and not refilled.  - major challenge as can't see well enough to even tell which inhaler he's reaching for - - The proper method of use, as well as anticipated side effects, of a metered-dose inhaler are discussed and demonstrated to the patient. Improved effectiveness after extensive coaching during this visit to a level of approximately 75 % from a baseline of 50%   Allergy> continue singulair/ flonase/ rx acutely with Prednisone 10 mg take  4 each am x 2 days,   2 each am x 2 days,  1 each am x 2 days and stop    ? Acid (or non-acid) GERD > always difficult to exclude as up to 75% of pts in some series report no assoc GI/ Heartburn symptoms> rec max (24h)  acid suppression and diet restrictions whenever flare of resp symptoms  ? Anxiety > usually at the bottom of this list of usual suspects but should be much higher on this pt's based on H and P and note already on psychotropics > Follow up per  Primary Care planned    I had an extended discussion with the patient reviewing all relevant studies completed to date and  lasting 15 to 20 minutes of a 25 minute visit    Each maintenance medication was reviewed in detail including most importantly the difference between maintenance and prns and under what circumstances the prns are to be triggered using an action plan format that is not reflected in the computer generated alphabetically organized AVS.    Please see instructions for details which were reviewed in writing and the patient given a copy highlighting the part that I personally wrote and discussed at today's ov.

## 2015-04-27 ENCOUNTER — Telehealth: Payer: Self-pay | Admitting: Pulmonary Disease

## 2015-04-27 MED ORDER — PREDNISONE 10 MG PO TABS: ORAL_TABLET | ORAL | Status: DC

## 2015-04-27 NOTE — Telephone Encounter (Signed)
Called and spoke with pt and informed of MW rec Pt voiced understanding  Nothing further is needed

## 2015-04-27 NOTE — Telephone Encounter (Signed)
Fine with me to extend to 8  Take 4 for two days three for two days two for two days one for two days

## 2015-04-27 NOTE — Telephone Encounter (Signed)
Pt was seen by MW on 04/20/15 for acute visit- was given Pred 10mg  taper (4x2, 2x2, 1x2  #14) Pt has not started this - just picked this up today d/t having financial issues.  Pt states that when he picked up Rx he noticed that it was only a 6-day taper.  Upcoming surgery 05/04/15 and would like to be well before this.  Pt states that he does not do well with anything less than 8 days as he rebounds quickly.  Requesting additional tablets to be called in to make 8 day taper or longer.  Please advise Dr Sherene SiresWert. Thanks.

## 2015-05-04 ENCOUNTER — Other Ambulatory Visit: Payer: Self-pay | Admitting: Surgery

## 2015-05-05 ENCOUNTER — Emergency Department (HOSPITAL_COMMUNITY)
Admission: EM | Admit: 2015-05-05 | Discharge: 2015-05-05 | Disposition: A | Discharge status: Home / Self Care | Payer: Medicaid Other | Attending: Emergency Medicine | Admitting: Emergency Medicine

## 2015-05-05 ENCOUNTER — Emergency Department (HOSPITAL_COMMUNITY): Payer: Medicaid Other

## 2015-05-05 ENCOUNTER — Encounter (HOSPITAL_COMMUNITY): Payer: Self-pay | Admitting: Emergency Medicine

## 2015-05-05 DIAGNOSIS — J449 Chronic obstructive pulmonary disease, unspecified: Secondary | ICD-10-CM | POA: Insufficient documentation

## 2015-05-05 DIAGNOSIS — I10 Essential (primary) hypertension: Secondary | ICD-10-CM | POA: Diagnosis not present

## 2015-05-05 DIAGNOSIS — Z8701 Personal history of pneumonia (recurrent): Secondary | ICD-10-CM | POA: Diagnosis not present

## 2015-05-05 DIAGNOSIS — Z87828 Personal history of other (healed) physical injury and trauma: Secondary | ICD-10-CM | POA: Diagnosis not present

## 2015-05-05 DIAGNOSIS — G8929 Other chronic pain: Secondary | ICD-10-CM | POA: Diagnosis not present

## 2015-05-05 DIAGNOSIS — N400 Enlarged prostate without lower urinary tract symptoms: Secondary | ICD-10-CM | POA: Diagnosis not present

## 2015-05-05 DIAGNOSIS — Z8781 Personal history of (healed) traumatic fracture: Secondary | ICD-10-CM | POA: Insufficient documentation

## 2015-05-05 DIAGNOSIS — N200 Calculus of kidney: Secondary | ICD-10-CM | POA: Diagnosis not present

## 2015-05-05 DIAGNOSIS — M8580 Other specified disorders of bone density and structure, unspecified site: Secondary | ICD-10-CM | POA: Diagnosis not present

## 2015-05-05 DIAGNOSIS — Z8719 Personal history of other diseases of the digestive system: Secondary | ICD-10-CM | POA: Diagnosis not present

## 2015-05-05 DIAGNOSIS — Z7952 Long term (current) use of systemic steroids: Secondary | ICD-10-CM | POA: Diagnosis not present

## 2015-05-05 DIAGNOSIS — N390 Urinary tract infection, site not specified: Secondary | ICD-10-CM | POA: Insufficient documentation

## 2015-05-05 DIAGNOSIS — F419 Anxiety disorder, unspecified: Secondary | ICD-10-CM | POA: Diagnosis not present

## 2015-05-05 DIAGNOSIS — Z79899 Other long term (current) drug therapy: Secondary | ICD-10-CM | POA: Insufficient documentation

## 2015-05-05 DIAGNOSIS — Z872 Personal history of diseases of the skin and subcutaneous tissue: Secondary | ICD-10-CM | POA: Diagnosis not present

## 2015-05-05 DIAGNOSIS — M4686 Other specified inflammatory spondylopathies, lumbar region: Secondary | ICD-10-CM | POA: Diagnosis not present

## 2015-05-05 DIAGNOSIS — Z880 Allergy status to penicillin: Secondary | ICD-10-CM | POA: Diagnosis not present

## 2015-05-05 DIAGNOSIS — R109 Unspecified abdominal pain: Secondary | ICD-10-CM

## 2015-05-05 DIAGNOSIS — Z87891 Personal history of nicotine dependence: Secondary | ICD-10-CM | POA: Insufficient documentation

## 2015-05-05 LAB — URINALYSIS, ROUTINE W REFLEX MICROSCOPIC
Bilirubin Urine: NEGATIVE
Glucose, UA: NEGATIVE mg/dL
KETONES UR: NEGATIVE mg/dL
NITRITE: POSITIVE — AB
PROTEIN: NEGATIVE mg/dL
Specific Gravity, Urine: 1.012 (ref 1.005–1.030)
pH: 6.5 (ref 5.0–8.0)

## 2015-05-05 LAB — CBC WITH DIFFERENTIAL/PLATELET
BASOS ABS: 0 10*3/uL (ref 0.0–0.1)
BASOS PCT: 0 %
EOS ABS: 0.1 10*3/uL (ref 0.0–0.7)
Eosinophils Relative: 0 %
HCT: 45.2 % (ref 39.0–52.0)
HEMOGLOBIN: 15.5 g/dL (ref 13.0–17.0)
Lymphocytes Relative: 5 %
Lymphs Abs: 1 10*3/uL (ref 0.7–4.0)
MCH: 31.7 pg (ref 26.0–34.0)
MCHC: 34.3 g/dL (ref 30.0–36.0)
MCV: 92.4 fL (ref 78.0–100.0)
Monocytes Absolute: 1.8 10*3/uL — ABNORMAL HIGH (ref 0.1–1.0)
Monocytes Relative: 9 %
NEUTROS PCT: 86 %
Neutro Abs: 15.9 10*3/uL — ABNORMAL HIGH (ref 1.7–7.7)
Platelets: 228 10*3/uL (ref 150–400)
RBC: 4.89 MIL/uL (ref 4.22–5.81)
RDW: 13.3 % (ref 11.5–15.5)
WBC: 18.7 10*3/uL — ABNORMAL HIGH (ref 4.0–10.5)

## 2015-05-05 LAB — BASIC METABOLIC PANEL
ANION GAP: 9 (ref 5–15)
BUN: 17 mg/dL (ref 6–20)
CHLORIDE: 108 mmol/L (ref 101–111)
CO2: 24 mmol/L (ref 22–32)
CREATININE: 1.57 mg/dL — AB (ref 0.61–1.24)
Calcium: 8.9 mg/dL (ref 8.9–10.3)
GFR calc non Af Amer: 45 mL/min — ABNORMAL LOW (ref >60)
GFR, EST AFRICAN AMERICAN: 52 mL/min — AB (ref >60)
Glucose, Bld: 143 mg/dL — ABNORMAL HIGH (ref 65–99)
POTASSIUM: 3.5 mmol/L (ref 3.5–5.1)
SODIUM: 141 mmol/L (ref 135–145)

## 2015-05-05 LAB — URINE MICROSCOPIC-ADD ON: Squamous Epithelial / LPF: NONE SEEN

## 2015-05-05 MED ORDER — CEFTRIAXONE SODIUM 1 G IJ SOLR
1.0000 g | Freq: Once | INTRAMUSCULAR | Status: AC
  Administered 2015-05-05: 1 g via INTRAMUSCULAR
  Filled 2015-05-05: qty 10

## 2015-05-05 MED ORDER — LIDOCAINE HCL (PF) 1 % IJ SOLN
INTRAMUSCULAR | Status: AC
  Administered 2015-05-05: 17:00:00
  Filled 2015-05-05: qty 5

## 2015-05-05 MED ORDER — CEPHALEXIN 500 MG PO CAPS: 500.0000 mg | ORAL_CAPSULE | Freq: Three times a day (TID) | ORAL | Status: DC

## 2015-05-05 MED ORDER — HYDROMORPHONE HCL 1 MG/ML IJ SOLN
1.0000 mg | Freq: Once | INTRAMUSCULAR | Status: AC
  Administered 2015-05-05: 1 mg via INTRAVENOUS
  Filled 2015-05-05: qty 1

## 2015-05-05 NOTE — ED Notes (Signed)
Pt now states that he is having lower abd pain. Pt adds that he was told he has 5 kidney stones on the left side. Pt reports that he has only had a "trickle of urine" today. Pt sts that he was urinating normally last pm. Pt is A&O and in NAD

## 2015-05-05 NOTE — ED Notes (Addendum)
Pt from home via EMS- Per EMS, pt c/o L flank pain. Pt has hx of kidney stones. Pt received 100 Fentanyl en route that did not help his pain. Pt is A&O and in NAD. Pt adds that he had hemorrhoid and wart removal yesterday.

## 2015-05-05 NOTE — ED Notes (Signed)
Bed: [id number] Expected date:  Expected time:  Means of arrival:  Comments: ? Kidney stone

## 2015-05-05 NOTE — ED Notes (Signed)
Bladder scan results per Gala Murdochanisha, NT 860ml

## 2015-05-05 NOTE — ED Provider Notes (Signed)
CSN: [id number]     Arrival date & time 05/05/15  1256 History   First MD Initiated Contact with Patient 05/05/15 1301     Chief Complaint  Patient presents with  . Flank Pain    HPI   [name] [name] is a 64 y.o. male with a PMH of COPD, HTN, headaches, chronic back pain, nephrolithiasis who presents to the ED with suprapubic pain, which he states started this morning and has been constant since that time. He also reports left flank pain. He denies exacerbating factors. He has not tried anything for symptom relief. He denies fever, chills. He reports associated nausea. He denies diarrhea, constipation, dysuria, urgency, frequency. He states he has a history of kidney stones, and reports his symptoms feel similar.   Past Medical History  Diagnosis Date  . 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 Cough   . Esophageal reflux   . Osteopenia   . Personal history of other diseases of digestive system   . Contact dermatitis and other eczema, due to unspecified cause   . Unspecified sinusitis (chronic)   . Allergic rhinitis   . Unspecified asthma(493.90)   . COPD (chronic obstructive pulmonary disease) (HCC)   . Seasonal allergies   . Ulcerative colitis   . H/O hiatal hernia   . Arthritis     spine  . Anxiety   . Multiple open wounds     "from esczema- scratching"  . Multiple facial bone fractures (HCC)   . Hep C w/o coma, chronic (HCC) early 2013    "possible"  . History of bronchitis   . Pneumonia     in past had several times  . Shortness of breath     "sometime"  . Chronic lower back pain   . Kidney stones   . Depression     patient constantly hitting left side of face "due to pain"  . Pancreatitis   . Headache     PMH: migraines  . Hypertension     not on medication at this time  . Enlarged prostate    Past Surgical History  Procedure Laterality Date  . Litrotripsy    . Orif distal radius fracture  09/12/11    right  . Fracture surgery    .  Metacarpal pinning      right hand  . Patella fracture surgery      right  . Tonsillectomy and adenoidectomy    . Eye surgery      3 Corneal transplant (bilateral eyes)  . Hardware removal  10/09/2011    Procedure: HARDWARE REMOVAL;  Surgeon: Nadara Mustard, MD;  Location: [facility] OR;  Service: Orthopedics;  Laterality: Right;  Removal Deep Hardware Right Wrist, placement antibiotic beads.  . Orif wrist fracture Right 06/15/2013    Procedure: RIGHT RADIUS PSTEOTOMY, DISTAL ULNA RESECTION, AND DIGITAL FLEXOR LENGTHENING AS NEEDED;  Surgeon: Marlowe Shores, MD;  Location: [facility];  Service: Orthopedics;  Laterality: Right;  . Carpal tunnel release Right 06/15/2013    Procedure: RIGHT CARPAL TUNNEL RELEASE;  Surgeon: Marlowe Shores, MD;  Location: [facility];  Service: Orthopedics;  Laterality: Right;  . Inguinal hernia repair    . Colonoscopy w/ biopsies and polypectomy    . Radiology with anesthesia N/A 03/09/2014    Procedure: MRI - Cervical and Lumbar without Contrast;  Surgeon: Medication Radiologist, MD;  Location: [facility] OR;  Service: Radiology;  Laterality: N/A;  . Radiology with anesthesia N/A 04/03/2015    Procedure: MRI OF BRAIN    (RADIOLOGY WITH ANESTHESIA);  Surgeon: Medication Radiologist, MD;  Location: [facility] OR;  Service: Radiology;  Laterality: N/A;   Family History  Problem Relation Age of Onset  . Allergies Mother     atopic  . Asthma Mother   . Heart failure Mother     CHF  . Other Father   . Anesthesia problems Neg Hx    Social History  Substance Use Topics  . Smoking status: Former Smoker -- 1.00 packs/day for 3 years    Types: Cigarettes, Pipe    Quit date: 05/20/1983  . Smokeless tobacco: Never Used  . Alcohol Use: Yes     Comment: "scotch once a year"      Review of Systems  Constitutional: Negative for fever and chills.  Gastrointestinal: Positive for nausea and abdominal pain. Negative for vomiting, diarrhea and  constipation.  Genitourinary: Positive for flank pain. Negative for dysuria, urgency and frequency.  All other systems reviewed and are negative.     Allergies  Bacitracin-polymyxin b; Bee venom; Codeine; Norco; Penicillins; Shellfish-derived products; Tape; Sulfate; and Chlorhexidine  Home Medications   Prior to Admission medications   Medication Sig Start Date End Date Taking? Authorizing Provider  albuterol (PROVENTIL HFA;VENTOLIN HFA) 108 (90 BASE) MCG/ACT inhaler Inhale 2 puffs into the lungs every 6 (six) hours as needed for wheezing or shortness of breath (wheezing & shortness of breath). 06/11/14  Yes Azalia Bilis, MD  albuterol (PROVENTIL) (2.5 MG/3ML) 0.083% nebulizer solution Take 3 mLs (2.5 mg total) by nebulization every 4 (four) hours as needed for wheezing or shortness of breath. 04/17/15  Yes Tammy S Parrett, NP  Aspirin-Salicylamide-Caffeine (BC HEADACHE PO) Take 2 packets by mouth daily as needed (headaches).    Yes Historical Provider, MD  calcium-vitamin D (CALCIUM 500/D) 500-200 MG-UNIT tablet Take 1 tablet by mouth daily.   Yes Historical Provider, MD  diazepam (VALIUM) 10 MG tablet Take 1 tablet (10 mg total) by mouth 3 (three) times daily. 06/06/14  Yes Osvaldo Shipper, MD  EPINEPHrine (EPIPEN 2-PAK) 0.3 mg/0.3 mL IJ SOAJ injection Inject 0.3 mg into the muscle once.   Yes Historical Provider, MD  fluticasone (FLONASE) 50 MCG/ACT nasal spray USE 2 SPRAYS IN EACH NOSTRIL TWICE DAILY. 10/17/14  Yes Storm Frisk, MD  guaiFENesin (MUCINEX) 600 MG 12 hr tablet Take 1 tablet (600 mg total) by mouth 2 (two) times daily. 06/06/14  Yes Osvaldo Shipper, MD  hydrOXYzine (ATARAX/VISTARIL) 25 MG tablet Take 1 tablet (25 mg total) by mouth 3 (three) times daily as needed for itching (itching). 06/11/14  Yes Azalia Bilis, MD  ibuprofen (ADVIL,MOTRIN) 600 MG tablet Take 600 mg by mouth 2 (two) times daily as needed for headache or moderate pain.   Yes Historical Provider, MD   mometasone-formoterol (DULERA) 200-5 MCG/ACT AERO Inhale 2 puffs into the lungs 2 (two) times daily. 06/11/14  Yes Azalia Bilis, MD  montelukast (SINGULAIR) 10 MG tablet Take 1 tablet (10 mg total) by mouth at bedtime. 08/09/14  Yes Tammy S Parrett, NP  prednisoLONE acetate (PRED FORTE) 1 % ophthalmic suspension Place 1 drop into both eyes daily.    Yes Historical Provider, MD  predniSONE (DELTASONE) 10 MG tablet Take  4 each am x 2 days,   2 each am x 2 days,  1 each am x 2 days and stop 04/27/15  Yes Nyoka Cowden, MD  sertraline (  ZOLOFT) 100 MG tablet Take 2 tablets (200 mg total) by mouth daily. 06/11/14  Yes Azalia BilisKevin Campos, MD  tamsulosin (FLOMAX) 0.4 MG CAPS capsule Take 0.4 mg by mouth daily after supper.   Yes Historical Provider, MD  temazepam (RESTORIL) 15 MG capsule Take 15 mg by mouth at bedtime as needed for sleep.   Yes Historical Provider, MD  traZODone (DESYREL) 100 MG tablet Take 100 mg by mouth at bedtime as needed for sleep.    Yes Historical Provider, MD    BP 129/87 mmHg  Pulse 83  Temp(Src) 97.5 F (36.4 C) (Oral)  Resp 16  SpO2 98% Physical Exam  Constitutional: He is oriented to person, place, and time. He appears well-developed and well-nourished. No distress.  Chronically ill appearing male in no acute distress, though appears uncomfortable due to pain.  HENT:  Head: Normocephalic and atraumatic.  Right Ear: External ear normal.  Left Ear: External ear normal.  Nose: Nose normal.  Mouth/Throat: Uvula is midline, oropharynx is clear and moist and mucous membranes are normal.  Eyes: Conjunctivae, EOM and lids are normal. Pupils are equal, round, and reactive to light. Right eye exhibits no discharge. Left eye exhibits no discharge. No scleral icterus.  Neck: Normal range of motion. Neck supple.  Cardiovascular: Normal rate, regular rhythm, normal heart sounds, intact distal pulses and normal pulses.   Pulmonary/Chest: Effort normal and breath sounds normal. No  respiratory distress. He has no wheezes. He has no rales.  Abdominal: Soft. Normal appearance and bowel sounds are normal. He exhibits no distension and no mass. There is tenderness. There is CVA tenderness. There is no rigidity, no rebound and no guarding.  Mild tenderness to palpation in suprapubic region. Left-sided CVA tenderness.  Musculoskeletal: Normal range of motion. He exhibits no edema or tenderness.  Neurological: He is alert and oriented to person, place, and time. He has normal strength. No cranial nerve deficit or sensory deficit.  Skin: Skin is warm, dry and intact. No rash noted. He is not diaphoretic. No erythema. No pallor.  Psychiatric: He has a normal mood and affect. His speech is normal and behavior is normal.  Nursing note and vitals reviewed.   ED Course  Procedures (including critical care time)  Labs Review Labs Reviewed  CBC WITH DIFFERENTIAL/PLATELET - Abnormal; Notable for the following:    WBC 18.7 (*)    Neutro Abs 15.9 (*)    Monocytes Absolute 1.8 (*)    All other components within normal limits  BASIC METABOLIC PANEL - Abnormal; Notable for the following:    Glucose, Bld 143 (*)    Creatinine, Ser 1.57 (*)    GFR calc non Af Amer 45 (*)    GFR calc Af Amer 52 (*)    All other components within normal limits  URINALYSIS, ROUTINE W REFLEX MICROSCOPIC (NOT AT [facility]) - Abnormal; Notable for the following:    APPearance CLOUDY (*)    Hgb urine dipstick LARGE (*)    Nitrite POSITIVE (*)    Leukocytes, UA TRACE (*)    All other components within normal limits  URINE MICROSCOPIC-ADD ON - Abnormal; Notable for the following:    Bacteria, UA MANY (*)    All other components within normal limits    Imaging Review Ct Renal Stone Study  05/05/2015  CLINICAL DATA:  Left flank pain EXAM: CT ABDOMEN AND PELVIS WITHOUT CONTRAST TECHNIQUE: Multidetector CT imaging of the abdomen and pelvis was performed following the standard protocol without IV  contrast.  COMPARISON:  09/28/2011 FINDINGS: Multiple healing right lateral lower rib fractures are noted. There is associated dependent atelectasis for scarring at the lung bases. Liver, gallbladder, spleen, pancreas, adrenal glands are within normal limits. Bilateral nephrolithiasis is stable. No definite ureteral calculus. No evidence of hydronephrosis. Hyperdense exophytic lesion emanating from the right kidney has enlarged and now measures 12 mm. Previously, it measured 9 mm. Bladder is decompressed.  Prostate is unremarkable. There is trace free fluid in the right hemipelvis. There is no obvious retroperitoneal adenopathy by measurement criteria. T11 and L1 compression fractures are stable. A T12 wedge compression deformity has developed since the prior study. Age of this fracture is indeterminate. IMPRESSION: Bilateral nephrolithiasis. No evidence of ureteral obstruction or ureteral calculus. Enlarging exophytic hyperdense lesion from the lower pole of the right kidney. Neoplasm is not excluded. MRI of the kidneys is recommended. New T12 compression fracture since the prior study. Exact age is indeterminate. MRI can be performed as clinically indicated to further delineate. Multiple healing right-sided rib fractures. Electronically Signed   By: Jolaine Click M.D.   On: 05/05/2015 15:35     I have personally reviewed and evaluated these images and lab results as part of my medical decision-making.   EKG Interpretation None      MDM   Final diagnoses:  Left flank pain  UTI (lower urinary tract infection)  Nephrolithiasis    64 year old male with past medical history of nephrolithiasis presents with suprapubic pain and left flank pain, which he states started this morning. Reports associated nausea. Denies fever, chills, vomiting, diarrhea, constipation, dysuria, urgency, frequency.  Patient is afebrile. Vital signs stable. Heart regular rate and rhythm. Lungs clear to auscultation bilaterally. Abdomen  soft, nondistended, with mild tenderness to palpation in suprapubic region. No rebound, guarding, or masses. Left-sided CVA tenderness present on exam. Patient moves all extremities without difficulty.  Patient given pain medication.  CBC remarkable for leukocytosis of 18.7. BMP remarkable for creatinine 1.57, which appears slightly elevated from baseline. UA remarkable for large hemoglobin, positive nitrites, trace leukocytes. Microscopic remarkable for TNTC RBC, many bacteria. CT renal study remarkable for bilateral nephrolithiasis, no ureteral obstruction or calculus. Enlarging lesion from lower pole of right kidney, neoplasm not excluded, MRI recommended. Discussed findings with patient.  Patient discussed with and seen by Dr. Estell Harpin. Spoke with patient at length regarding findings. Will treat for UTI and possible pyelonephritis with rocephin in the ED and discharge with keflex (PCN allergy listed, though patient states he does not remember having a significant allergic reaction to PCN). Patient understands to follow-up with urology for further evaluation and management of renal mass. Return precautions discussed. Patient verbalizes his understanding and is in agreement with plan.  BP 129/87 mmHg  Pulse 83  Temp(Src) 97.5 F (36.4 C) (Oral)  Resp 16  SpO2 98%     Mady Gemma, PA-C 05/05/15 1618  Bethann Berkshire, MD 05/06/15 [phone]

## 2015-05-05 NOTE — Discharge Instructions (Signed)
1. Medications: keflex, usual home medications 2. Treatment: rest, drink plenty of fluids 3. Follow Up: please followup with your primary doctor for discussion of your diagnoses and further evaluation after today's visit, please follow-up with urology for further evaluation of the mass on your kidney; if you do not have a primary care doctor use the resource guide provided to find one; please return to the ER for high fever, severe pain, new or worsening symptoms   Kidney Stones Kidney stones (urolithiasis) are solid masses that form inside your kidneys. The intense pain is caused by the stone moving through the kidney, ureter, bladder, and urethra (urinary tract). When the stone moves, the ureter starts to spasm around the stone. The stone is usually passed in your pee (urine).  HOME CARE  Drink enough fluids to keep your pee clear or pale yellow. This helps to get the stone out.  Take a 24-hour pee (urine) sample as told by your doctor. You may need to take another sample every 6-12 months.  Strain all pee through the provided strainer. Do not pee without peeing through the strainer, not even once. If you pee the stone out, catch it in the strainer. The stone may be as small as a grain of salt. Take this to your doctor. This will help your doctor figure out what you can do to try to prevent more kidney stones.  Only take medicine as told by your doctor.  Make changes to your daily diet as told by your doctor. You may be told to:  Limit how much salt you eat.  Eat 5 or more servings of fruits and vegetables each day.  Limit how much meat, poultry, fish, and eggs you eat.  Keep all follow-up visits as told by your doctor. This is important.  Get follow-up X-rays as told by your doctor. GET HELP IF: You have pain that gets worse even if you have been taking pain medicine. GET HELP RIGHT AWAY IF:   Your pain does not get better with medicine.  You have a fever or shaking  chills.  Your pain increases and gets worse over 18 hours.  You have new belly (abdominal) pain.  You feel faint or pass out.  You are unable to pee.   This information is not intended to replace advice given to you by your health care provider. Make sure you discuss any questions you have with your health care provider.   Document Released: 10/22/2007 Document Revised: 01/24/2015 Document Reviewed: 10/06/2012 Elsevier Interactive Patient Education 2016 Elsevier Inc.  Urinary Tract Infection A urinary tract infection (UTI) can occur any place along the urinary tract. The tract includes the kidneys, ureters, bladder, and urethra. A type of germ called bacteria often causes a UTI. UTIs are often helped with antibiotic medicine.  HOME CARE   If given, take antibiotics as told by your doctor. Finish them even if you start to feel better.  Drink enough fluids to keep your pee (urine) clear or pale yellow.  Avoid tea, drinks with caffeine, and bubbly (carbonated) drinks.  Pee often. Avoid holding your pee in for a long time.  Pee before and after having sex (intercourse).  Wipe from front to back after you poop (bowel movement) if you are a woman. Use each tissue only once. GET HELP RIGHT AWAY IF:   You have back pain.  You have lower belly (abdominal) pain.  You have chills.  You feel sick to your stomach (nauseous).  You throw  up (vomit).  Your burning or discomfort with peeing does not go away.  You have a fever.  Your symptoms are not better in 3 days. MAKE SURE YOU:   Understand these instructions.  Will watch your condition.  Will get help right away if you are not doing well or get worse.   This information is not intended to replace advice given to you by your health care provider. Make sure you discuss any questions you have with your health care provider.   Document Released: 10/22/2007 Document Revised: 05/26/2014 Document Reviewed: 12/04/2011 Elsevier  Interactive Patient Education 2016 [employer].   Emergency Department Resource Guide 1) Find a Doctor and Pay Out of Pocket Although you won't have to find out who is covered by your insurance plan, it is a good idea to ask around and get recommendations. You will then need to call the office and see if the doctor you have chosen will accept you as a new patient and what types of options they offer for patients who are self-pay. Some doctors offer discounts or will set up payment plans for their patients who do not have insurance, but you will need to ask so you aren't surprised when you get to your appointment.  2) Contact Your Local Health Department Not all health departments have doctors that can see patients for sick visits, but many do, so it is worth a call to see if yours does. If you don't know where your local health department is, you can check in your phone book. The CDC also has a tool to help you locate your state's health department, and many state websites also have listings of all of their local health departments.  3) Find a Walk-in Clinic If your illness is not likely to be very severe or complicated, you may want to try a walk in clinic. These are popping up all over the country in pharmacies, drugstores, and shopping centers. They're usually staffed by nurse practitioners or physician assistants that have been trained to treat common illnesses and complaints. They're usually fairly quick and inexpensive. However, if you have serious medical issues or chronic medical problems, these are probably not your best option.  No Primary Care Doctor: - Call Health Connect at  [phone] - they can help you locate a primary care doctor that  accepts your insurance, provides certain services, etc. - Physician Referral Service- [phone]  Chronic Pain Problems: Organization         Address  Phone   Notes  [facility]  [phone] Patients need to be referred by  their primary care doctor.   Medication Assistance: Organization         Address  Phone   Notes  [facility] Medication [facility] [address] [place] [place], [place] [address] [phone] --Must be a resident of [facility] -- Must have NO insurance coverage whatsoever (no Medicaid/ Medicare, etc.) -- The pt. MUST have a primary care doctor that directs their care regularly and follows them in the community   [facility]  [phone]   [employer]  [phone]    Agencies that provide inexpensive medical care: Organization         Address  Phone   Notes  [facility]  [phone]   [facility]    [phone]   [facility] [address] [place], [place] [address] [phone]   [facility] [place] [address], [place] [phone]   Planned Parenthood    [phone]   [facility]    [phone]   Community Health and [facility]  [address], [place] Phone:  [phone], Fax:  [phone] Hours of Operation:  9 am - 6 pm, M-F.  Also accepts Medicaid/Medicare and self-pay.  [facility]  [address], [place] Phone: [phone], Fax: [phone]. Hours of Operation:  8:30 am - 5:30 pm, M-F.  Also accepts Medicaid and self-pay.  [facility] [place] [address] [place] Phone: [phone]   [facility] [address] Natasha Bence [place], [place] [phone], Ext. [phone] Mondays & Thursdays: 7-9 AM.  First 15 patients are seen on a first come, first serve basis.    Medicaid-accepting [facility] Providers:  Organization         Address  Phone   Notes  [facility] [address], [place] [phone] Also accepts self-pay patients.  [facility] [address] Laurell Josephs [place], [place]  [phone]   [facility]  [address], [place] [phone]   [facility] [address], [place] [phone]   Renaye Rakers [address], [place]   [phone] Only accepts [place] Access [place] patients after they have their name applied to their card.   Self-Pay (no insurance) in [facility]:  Organization         Address  Phone   Notes  Sickle Cell Patients, [facility] Medicine [address] [place], [place] [phone]   [facility] [address] [place], [place] [phone]   [facility] [place]  [address], [place] [phone]   [facility]/Dr. Osei-Bonsu  [address] [place] or [address], [place] [phone] Phone number for both [place] and [place] locations is the same.  Urgent Medical and [facility] [address] [place] [phone]   [facility] [address] [place] or [address] [phone]   [facility] [address] [place] [phone], phone; [phone], fax Sees patients 1st and 3rd Saturday of every month.  Must not qualify for public or private insurance (i.e. Medicaid, Medicare, [place] Health Choice, Veterans' Benefits)  Household income should be no more than 200% of the poverty level The clinic cannot treat you if you are pregnant or think you are pregnant  Sexually transmitted diseases are not treated at the clinic.    Dental Care: Organization         Address  Phone  Notes  [facility] Department of [facility]  [address] [place], [place] [phone] Accepts children up to age 1 who are enrolled in [place] or [place] Health Choice; pregnant women with a Medicaid card; and children who have applied for Medicaid or [place] Health Choice, but were declined, whose parents can pay a reduced  fee at time of service.  [facility] of [facility]  [address], [place] [phone] Accepts children up to age 77 who are enrolled in [place] or [place] Health Choice; pregnant women with a Medicaid card; and children  who have applied for Medicaid or [place] Health Choice, but were declined, whose parents can pay a reduced fee at time of service.  [facility]  [address], [place] [phone] Patients are seen by appointment only. Walk-ins are not accepted. [facility] will see patients 64 years of age and older. Monday - Tuesday (8am-5pm) Most Wednesdays (8:30-5pm) $30 per visit, cash only  [facility]  [address], [facility] [place] [phone] Patients are seen by appointment only. Walk-ins are not accepted. [facility] will see patients 64 years of age and older. One Wednesday Evening (Monthly: Volunteer Based).  $30 per visit, cash only  [employer] [facility]  [phone] for adults; Children under age 734, call Graduate Pediatric Dentistry at [phone]. Children aged 64-14, please call [phone] to request a pediatric application.  Dental services are provided in all areas of dental care including fillings, crowns and bridges, complete and partial dentures, implants, gum treatment, root canals, and extractions. Preventive care is also provided. Treatment is provided to both adults and children. Patients are selected via a lottery and there is often a waiting list.   [facility] [address], [place]  [phone] [email or web address]   [facility] [address], [place], [place] [phone], Ext. [phone] Second and Fourth Thursday of each month, opens at 6:30 AM; Clinic ends at 9 AM.  Patients are seen on a first-come first-served basis, and a limited number are seen during each clinic.   [facility]  [address],  [place], [place] [phone]   Eligibility Requirements You must have lived in [place], [place], or [place] counties for at least the last three months.   You cannot be eligible for state or federal sponsored [place]healthcare insurance, including CIGNAVeterans Administration, [place]icaid, or [employer]icare.   You generally cannot be eligible for healthcare insurance through your employer.    How to apply: Eligibility screenings are held every Tuesday and Wednesday afternoon from 1:00 pm until 4:00 pm. You do not need an appointment for the interview!  [facility] [address], [place], [place] [phone]   [facility]  [phone]   [facility]  [phone]   [facility]  [phone]    Behavioral Health Resources in the Community: Intensive Outpatient Programs Organization         Address  Phone  Notes  [facility] [address], [place], [place] [phone]   [facility] [address], [place], [place] [phone]   [facility] [address], [place], [place]  [phone]   [facility] [address],  [place], [place] [phone] or [phone]   Substance Abuse Resources Organization         Address  Phone  Notes  [facility] Services  [phone]   [facility]  [phone]   [facility]  [phone]   [facility]  [phone]   Residential & Outpatient Substance Abuse Program  [phone]   Psychological Services Organization         Address  Phone  Notes  [facility]  [phone]- [phone]   [facility]  [phone]- [phone]   [facility] [address], [place] [phone] or [phone]    Mobile Crisis Teams Organization  Address  Phone  Notes  [facility], Mobile Crisis Care Unit  [phone]   [facility] Psychotherapeutic  Services  [address] [place], [place] [phone]   [facility] [address] [place] [phone]    Self-Help/Support Groups Organization         Address  Phone             Notes  [facility] of [place] - variety of support groups  336- I7437963 Call for more information  Narcotics Anonymous (NA), [facility] [address], [employer] [place]  2 meetings at this location   Statistician         Address  Phone  Notes  [facility] [address],    [place]  [phone]   [facility]  [address] [place] [address], [place], [place] [phone]   [facility] [address] [place], [place] [phone] Admissions: 8am-3pm M-F  [facility] [address],    [place], [place] [phone]   [facility] [address] [place], [place], [place] [phone]   [facility] [address]  [place], [place] [phone]   [facility] - Intensive Outpatient [address]ure[address] Joseph[address], [place], [place] [phone]   [facility] ([facility] [address] [place]  [place], [place] [phone] or [phone]   [facility] ([facility]) [address], [place], [place] [phone] Accepts Medicaid  [facility] [place] [address]  [place] [phone] Substance Abuse/Addiction Treatment   [facility] Organization         Address  Phone  Notes  [facility]  [phone]   Angie Fava, PhD [address] Ervin Knack [place], [place]   [phone] or [phone]   [facility]   [address] [place], [place] [phone]   [facility] [address] [place], [place] [phone] Insurance/Medicaid/sponsorship through [facility] and Families [address]                                    [place], [place] [phone] Therapy/tele-psych/case  [facility] [address], [place] [phone]    Dr. Lolly Mustache  [phone]   [facility] [place]  [employer] [facility] Dept. 1) [address], [place] 2) [address], [place] 3)  [address], [place] [phone]  [phone]   [facility] Child Abuse Hotline 940-753-5755 or [phone] (After Hours)

## 2015-05-06 ENCOUNTER — Encounter (HOSPITAL_COMMUNITY): Payer: Self-pay | Admitting: *Deleted

## 2015-05-06 ENCOUNTER — Emergency Department (HOSPITAL_COMMUNITY)
Admission: EM | Admit: 2015-05-06 | Discharge: 2015-05-06 | Disposition: A | Discharge status: Home / Self Care | Payer: Medicaid Other | Attending: Emergency Medicine | Admitting: Emergency Medicine

## 2015-05-06 DIAGNOSIS — N400 Enlarged prostate without lower urinary tract symptoms: Secondary | ICD-10-CM | POA: Diagnosis not present

## 2015-05-06 DIAGNOSIS — Z8781 Personal history of (healed) traumatic fracture: Secondary | ICD-10-CM | POA: Diagnosis not present

## 2015-05-06 DIAGNOSIS — Z87828 Personal history of other (healed) physical injury and trauma: Secondary | ICD-10-CM | POA: Insufficient documentation

## 2015-05-06 DIAGNOSIS — Z880 Allergy status to penicillin: Secondary | ICD-10-CM | POA: Insufficient documentation

## 2015-05-06 DIAGNOSIS — Z8719 Personal history of other diseases of the digestive system: Secondary | ICD-10-CM | POA: Diagnosis not present

## 2015-05-06 DIAGNOSIS — Z7951 Long term (current) use of inhaled steroids: Secondary | ICD-10-CM | POA: Diagnosis not present

## 2015-05-06 DIAGNOSIS — R339 Retention of urine, unspecified: Secondary | ICD-10-CM

## 2015-05-06 DIAGNOSIS — Z7952 Long term (current) use of systemic steroids: Secondary | ICD-10-CM | POA: Diagnosis not present

## 2015-05-06 DIAGNOSIS — J449 Chronic obstructive pulmonary disease, unspecified: Secondary | ICD-10-CM | POA: Insufficient documentation

## 2015-05-06 DIAGNOSIS — Z87442 Personal history of urinary calculi: Secondary | ICD-10-CM | POA: Insufficient documentation

## 2015-05-06 DIAGNOSIS — F329 Major depressive disorder, single episode, unspecified: Secondary | ICD-10-CM | POA: Diagnosis not present

## 2015-05-06 DIAGNOSIS — H548 Legal blindness, as defined in USA: Secondary | ICD-10-CM | POA: Diagnosis not present

## 2015-05-06 DIAGNOSIS — I10 Essential (primary) hypertension: Secondary | ICD-10-CM | POA: Diagnosis not present

## 2015-05-06 DIAGNOSIS — Z8619 Personal history of other infectious and parasitic diseases: Secondary | ICD-10-CM | POA: Insufficient documentation

## 2015-05-06 DIAGNOSIS — Z87891 Personal history of nicotine dependence: Secondary | ICD-10-CM | POA: Diagnosis not present

## 2015-05-06 DIAGNOSIS — F419 Anxiety disorder, unspecified: Secondary | ICD-10-CM | POA: Diagnosis not present

## 2015-05-06 DIAGNOSIS — G8929 Other chronic pain: Secondary | ICD-10-CM | POA: Insufficient documentation

## 2015-05-06 DIAGNOSIS — M1990 Unspecified osteoarthritis, unspecified site: Secondary | ICD-10-CM | POA: Insufficient documentation

## 2015-05-06 DIAGNOSIS — Z8701 Personal history of pneumonia (recurrent): Secondary | ICD-10-CM | POA: Insufficient documentation

## 2015-05-06 DIAGNOSIS — Z79899 Other long term (current) drug therapy: Secondary | ICD-10-CM | POA: Insufficient documentation

## 2015-05-06 DIAGNOSIS — Z872 Personal history of diseases of the skin and subcutaneous tissue: Secondary | ICD-10-CM | POA: Diagnosis not present

## 2015-05-06 LAB — I-STAT CHEM 8, ED
BUN: 18 mg/dL (ref 6–20)
CALCIUM ION: 1.11 mmol/L — AB (ref 1.13–1.30)
CHLORIDE: 104 mmol/L (ref 101–111)
CREATININE: 1.3 mg/dL — AB (ref 0.61–1.24)
GLUCOSE: 93 mg/dL (ref 65–99)
HCT: 48 % (ref 39.0–52.0)
Hemoglobin: 16.3 g/dL (ref 13.0–17.0)
POTASSIUM: 3.5 mmol/L (ref 3.5–5.1)
Sodium: 142 mmol/L (ref 135–145)
TCO2: 26 mmol/L (ref 0–100)

## 2015-05-06 LAB — URINALYSIS, ROUTINE W REFLEX MICROSCOPIC
BILIRUBIN URINE: NEGATIVE
GLUCOSE, UA: NEGATIVE mg/dL
Ketones, ur: NEGATIVE mg/dL
Nitrite: POSITIVE — AB
PH: 5.5 (ref 5.0–8.0)
Protein, ur: NEGATIVE mg/dL
SPECIFIC GRAVITY, URINE: 1.022 (ref 1.005–1.030)

## 2015-05-06 LAB — URINE MICROSCOPIC-ADD ON: SQUAMOUS EPITHELIAL / LPF: NONE SEEN

## 2015-05-06 MED ORDER — CIPROFLOXACIN IN D5W 400 MG/200ML IV SOLN
400.0000 mg | Freq: Once | INTRAVENOUS | Status: AC
  Administered 2015-05-06: 400 mg via INTRAVENOUS
  Filled 2015-05-06: qty 200

## 2015-05-06 MED ORDER — SODIUM CHLORIDE 0.9 % IV BOLUS (SEPSIS)
500.0000 mL | Freq: Once | INTRAVENOUS | Status: AC
  Administered 2015-05-06: 500 mL via INTRAVENOUS

## 2015-05-06 MED ORDER — CIPROFLOXACIN HCL 500 MG PO TABS: 500.0000 mg | ORAL_TABLET | Freq: Two times a day (BID) | ORAL | Status: DC

## 2015-05-06 NOTE — ED Notes (Signed)
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  Went over leg bag cath care.  Pt able to demonstrate removal of bag from leg.  Able to demonstrate removal of cap to empty leg bag with assist. Has home aide that can assist this evening.  Explained to pt that if any signs of retention/painful bladder, temperature or foul smelling urine occurs, please notify for assist.  He is to call urology on Monday morning and has paperwork to confirm number.  States home aide can assist with this.  Leg bag secured with device and straps upon discharge.

## 2015-05-06 NOTE — ED Notes (Addendum)
Per ems pt is was seen here yesterday for same c/o. Pt has hemorrhoid surgery a couple days ago, since then has not been able to urinate. Pt seen in ED last night, in and out performed and pt discharged home.pt has been unable to urinate since the in and out. Bladder distention. Pain 8/10.   Pt legally blind.   Upon rn assessment pt reports he had hemorrhoid/ anal surgery on 12/16, started having difficulty urinating on 12/17. Pt has attempted to urinate multiples times this morning, not successful. Pt reports bladder pain spasms.

## 2015-05-06 NOTE — Discharge Instructions (Signed)
[facility].

## 2015-05-06 NOTE — ED Notes (Signed)
Pt alert and oriented x4. Respirations even and unlabored, bilateral symmetrical rise and fall of chest. Skin warm and dry. In no acute distress. Denies needs.   

## 2015-05-06 NOTE — ED Notes (Signed)
PTAR at bedside 

## 2015-05-06 NOTE — ED Notes (Signed)
Bed: WA06 Expected date: 05/06/15 Expected time: 10:53 AM Means of arrival: Ambulance Comments: Unable to urinate, seen yesterday and I & O not voided since

## 2015-05-06 NOTE — ED Provider Notes (Signed)
CSN: [id number]     Arrival date & time 05/06/15  1051 History   First MD Initiated Contact with Patient 05/06/15 1058     Chief Complaint  Patient presents with  . Urinary Retention     (Consider location/radiation/quality/duration/timing/severity/associated sxs/prior Treatment) Patient is a 64 y.o. male presenting with abdominal pain. The history is provided by the patient (Patient was seen yesterday and diagnosed with a UTI. A cath urine was done at that time. Since that time he has not been able to urinate he complains of lower abdominal pain).  Abdominal Pain Pain location: Suprapubic. Pain quality: aching   Pain radiates to:  Does not radiate Pain severity:  Moderate Onset quality:  Sudden Timing:  Constant Progression:  Waxing and waning Associated symptoms: no chest pain, no cough, no diarrhea, no fatigue and no hematuria     Past Medical History  Diagnosis Date  . 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 Cough   . Esophageal reflux   . Osteopenia   . Personal history of other diseases of digestive system   . Contact dermatitis and other eczema, due to unspecified cause   . Unspecified sinusitis (chronic)   . Allergic rhinitis   . Unspecified asthma(493.90)   . COPD (chronic obstructive pulmonary disease) (HCC)   . Seasonal allergies   . Ulcerative colitis   . H/O hiatal hernia   . Arthritis     spine  . Anxiety   . Multiple open wounds     "from esczema- scratching"  . Multiple facial bone fractures (HCC)   . Hep C w/o coma, chronic (HCC) early 2013    "possible"  . History of bronchitis   . Pneumonia     in past had several times  . Shortness of breath     "sometime"  . Chronic lower back pain   . Kidney stones   . Depression     patient constantly hitting left side of face "due to pain"  . Pancreatitis   . Headache     PMH: migraines  . Hypertension     not on medication at this time  . Enlarged prostate    Past Surgical  History  Procedure Laterality Date  . Litrotripsy    . Orif distal radius fracture  09/12/11    right  . Fracture surgery    . Metacarpal pinning      right hand  . Patella fracture surgery      right  . Tonsillectomy and adenoidectomy    . Eye surgery      3 Corneal transplant (bilateral eyes)  . Hardware removal  10/09/2011    Procedure: HARDWARE REMOVAL;  Surgeon: Nadara Mustard, MD;  Location: [facility] OR;  Service: Orthopedics;  Laterality: Right;  Removal Deep Hardware Right Wrist, placement antibiotic beads.  . Orif wrist fracture Right 06/15/2013    Procedure: RIGHT RADIUS PSTEOTOMY, DISTAL ULNA RESECTION, AND DIGITAL FLEXOR LENGTHENING AS NEEDED;  Surgeon: Marlowe Shores, MD;  Location: [facility];  Service: Orthopedics;  Laterality: Right;  . Carpal tunnel release Right 06/15/2013    Procedure: RIGHT CARPAL TUNNEL RELEASE;  Surgeon: Marlowe Shores, MD;  Location: [facility];  Service: Orthopedics;  Laterality: Right;  . Inguinal hernia repair    . Colonoscopy w/ biopsies and polypectomy    . Radiology with anesthesia N/A 03/09/2014    Procedure: MRI - Cervical and Lumbar without  Contrast;  Surgeon: Medication Radiologist, MD;  Location: [facility] OR;  Service: Radiology;  Laterality: N/A;  . Radiology with anesthesia N/A 04/03/2015    Procedure: MRI OF BRAIN    (RADIOLOGY WITH ANESTHESIA);  Surgeon: Medication Radiologist, MD;  Location: [facility] OR;  Service: Radiology;  Laterality: N/A;   Family History  Problem Relation Age of Onset  . Allergies Mother     atopic  . Asthma Mother   . Heart failure Mother     CHF  . Other Father   . Anesthesia problems Neg Hx    Social History  Substance Use Topics  . Smoking status: Former Smoker -- 1.00 packs/day for 3 years    Types: Cigarettes, Pipe    Quit date: 05/20/1983  . Smokeless tobacco: Never Used  . Alcohol Use: Yes     Comment: "scotch once a year"    Review of Systems  Constitutional: Negative  for appetite change and fatigue.  HENT: Negative for congestion, ear discharge and sinus pressure.   Eyes: Negative for discharge.  Respiratory: Negative for cough.   Cardiovascular: Negative for chest pain.  Gastrointestinal: Positive for abdominal pain. Negative for diarrhea.  Genitourinary: Negative for frequency and hematuria.       Urinary retention  Musculoskeletal: Negative for back pain.  Skin: Negative for rash.  Neurological: Negative for seizures and headaches.  Psychiatric/Behavioral: Negative for hallucinations.      Allergies  Bacitracin-polymyxin b; Bee venom; Codeine; Norco; Penicillins; Shellfish-derived products; Tape; Sulfate; and Chlorhexidine  Home Medications   Prior to Admission medications   Medication Sig Start Date End Date Taking? Authorizing Provider  albuterol (PROVENTIL HFA;VENTOLIN HFA) 108 (90 BASE) MCG/ACT inhaler Inhale 2 puffs into the lungs every 6 (six) hours as needed for wheezing or shortness of breath (wheezing & shortness of breath). 06/11/14  Yes Azalia Bilis, MD  albuterol (PROVENTIL) (2.5 MG/3ML) 0.083% nebulizer solution Take 3 mLs (2.5 mg total) by nebulization every 4 (four) hours as needed for wheezing or shortness of breath. 04/17/15  Yes Tammy S Parrett, NP  Aspirin-Salicylamide-Caffeine (BC HEADACHE PO) Take 2 packets by mouth daily as needed (headaches).    Yes Historical Provider, MD  calcium-vitamin D (CALCIUM 500/D) 500-200 MG-UNIT tablet Take 1 tablet by mouth daily.   Yes Historical Provider, MD  cephALEXin (KEFLEX) 500 MG capsule Take 1 capsule (500 mg total) by mouth 3 (three) times daily. 05/05/15  Yes Mady Gemma, PA-C  diazepam (VALIUM) 10 MG tablet Take 1 tablet (10 mg total) by mouth 3 (three) times daily. 06/06/14  Yes Osvaldo Shipper, MD  fluticasone (FLONASE) 50 MCG/ACT nasal spray USE 2 SPRAYS IN EACH NOSTRIL TWICE DAILY. 10/17/14  Yes Storm Frisk, MD  guaiFENesin (MUCINEX) 600 MG 12 hr tablet Take 1 tablet  (600 mg total) by mouth 2 (two) times daily. 06/06/14  Yes Osvaldo Shipper, MD  hydrOXYzine (ATARAX/VISTARIL) 25 MG tablet Take 1 tablet (25 mg total) by mouth 3 (three) times daily as needed for itching (itching). 06/11/14  Yes Azalia Bilis, MD  ibuprofen (ADVIL,MOTRIN) 600 MG tablet Take 600 mg by mouth 2 (two) times daily as needed for headache or moderate pain.   Yes Historical Provider, MD  mometasone-formoterol (DULERA) 200-5 MCG/ACT AERO Inhale 2 puffs into the lungs 2 (two) times daily. 06/11/14  Yes Azalia Bilis, MD  montelukast (SINGULAIR) 10 MG tablet Take 1 tablet (10 mg total) by mouth at bedtime. 08/09/14  Yes Tammy S Parrett, NP  prednisoLONE acetate (PRED FORTE) 1 %  ophthalmic suspension Place 1 drop into both eyes daily.    Yes Historical Provider, MD  predniSONE (DELTASONE) 10 MG tablet Take  4 each am x 2 days,   2 each am x 2 days,  1 each am x 2 days and stop 04/27/15  Yes Nyoka CowdenMichael B Wert, MD  sertraline (ZOLOFT) 100 MG tablet Take 2 tablets (200 mg total) by mouth daily. 06/11/14  Yes Azalia BilisKevin Campos, MD  tamsulosin (FLOMAX) 0.4 MG CAPS capsule Take 0.4 mg by mouth daily after supper.   Yes Historical Provider, MD  temazepam (RESTORIL) 15 MG capsule Take 15 mg by mouth at bedtime as needed for sleep.   Yes Historical Provider, MD  traZODone (DESYREL) 100 MG tablet Take 100 mg by mouth at bedtime as needed for sleep.    Yes Historical Provider, MD  ciprofloxacin (CIPRO) 500 MG tablet Take 1 tablet (500 mg total) by mouth 2 (two) times daily. 05/06/15   Bethann BerkshireJoseph Raha Tennison, MD  EPINEPHrine (EPIPEN 2-PAK) 0.3 mg/0.3 mL IJ SOAJ injection Inject 0.3 mg into the muscle once.    Historical Provider, MD   BP 163/96 mmHg  Pulse 80  Temp(Src) 98.8 F (37.1 C) (Oral)  Resp 16  SpO2 98% Physical Exam  Constitutional: He is oriented to person, place, and time. He appears well-developed.  HENT:  Head: Normocephalic.  Eyes: Conjunctivae and EOM are normal. No scleral icterus.  Neck: Neck supple. No  thyromegaly present.  Cardiovascular: Normal rate and regular rhythm.  Exam reveals no gallop and no friction rub.   No murmur heard. Pulmonary/Chest: No stridor. He has no wheezes. He has no rales. He exhibits no tenderness.  Abdominal: He exhibits no distension. There is tenderness. There is no rebound.  Mild tenderness to lower abdomen with distended abdomen.  Musculoskeletal: Normal range of motion. He exhibits no edema.  Lymphadenopathy:    He has no cervical adenopathy.  Neurological: He is oriented to person, place, and time. He exhibits normal muscle tone. Coordination normal.  Skin: No rash noted. No erythema.  Psychiatric: He has a normal mood and affect. His behavior is normal.    ED Course  Procedures (including critical care time) Labs Review Labs Reviewed  URINALYSIS, ROUTINE W REFLEX MICROSCOPIC (NOT AT [facility]) - Abnormal; Notable for the following:    Color, Urine AMBER (*)    APPearance HAZY (*)    Hgb urine dipstick LARGE (*)    Nitrite POSITIVE (*)    Leukocytes, UA MODERATE (*)    All other components within normal limits  URINE MICROSCOPIC-ADD ON - Abnormal; Notable for the following:    Bacteria, UA RARE (*)    All other components within normal limits  I-STAT CHEM 8, ED - Abnormal; Notable for the following:    Creatinine, Ser 1.30 (*)    Calcium, Ion 1.11 (*)    All other components within normal limits  URINE CULTURE    Imaging Review Ct Renal Stone Study  05/05/2015  CLINICAL DATA:  Left flank pain EXAM: CT ABDOMEN AND PELVIS WITHOUT CONTRAST TECHNIQUE: Multidetector CT imaging of the abdomen and pelvis was performed following the standard protocol without IV contrast. COMPARISON:  09/28/2011 FINDINGS: Multiple healing right lateral lower rib fractures are noted. There is associated dependent atelectasis for scarring at the lung bases. Liver, gallbladder, spleen, pancreas, adrenal glands are within normal limits. Bilateral nephrolithiasis is stable. No  definite ureteral calculus. No evidence of hydronephrosis. Hyperdense exophytic lesion emanating from the right kidney has enlarged  and now measures 12 mm. Previously, it measured 9 mm. Bladder is decompressed.  Prostate is unremarkable. There is trace free fluid in the right hemipelvis. There is no obvious retroperitoneal adenopathy by measurement criteria. T11 and L1 compression fractures are stable. A T12 wedge compression deformity has developed since the prior study. Age of this fracture is indeterminate. IMPRESSION: Bilateral nephrolithiasis. No evidence of ureteral obstruction or ureteral calculus. Enlarging exophytic hyperdense lesion from the lower pole of the right kidney. Neoplasm is not excluded. MRI of the kidneys is recommended. New T12 compression fracture since the prior study. Exact age is indeterminate. MRI can be performed as clinically indicated to further delineate. Multiple healing right-sided rib fractures. Electronically Signed   By: Jolaine Click M.D.   On: 05/05/2015 15:35   I have personally reviewed and evaluated these images and lab results as part of my medical decision-making.   EKG Interpretation None      MDM   Final diagnoses:  Urinary retention    Patient had a Foley placed 500 mL  was in his bladder. Patient's symptoms improved immediately. His urine still appears to be infected. He has been on Keflex. Urine cultures done today. We will add Cipro to his antibiotic regimen. He is to continue the Keflex and Cipro and will follow-up in 2-3 days at that point a culture will indicate which antibiotic is best for him    Bethann Berkshire, MD 05/06/15 1549

## 2015-05-07 LAB — URINE CULTURE: CULTURE: NO GROWTH

## 2015-05-15 ENCOUNTER — Telehealth: Payer: Self-pay | Admitting: Pulmonary Disease

## 2015-05-15 MED ORDER — FLUTICASONE PROPIONATE 50 MCG/ACT NA SUSP: NASAL | Status: DC

## 2015-05-15 NOTE — Telephone Encounter (Signed)
Last seen with MW on 04/20/15  Patient Instructions     Automatic = Dulera 200 Take 2 puffs first thing in am and then another 2 puffs about 12 hours later.   Backup = Proair 2 every 4 hours as needed - goal less than twice a week   Crisis = Only use albuterol if your proair doesn't work - ok to use up to every 4 hours  depomedrol 120 mg IM today  Prednisone 10 mg take 4 each am x 2 days, 2 each am x 2 days, 1 each am x 2 days and stop   With flares of respiratory symptoms and or need for prednisone > Try prilosec otc 20mg  Take 30-60 min before first meal of the day and Pepcid ac (famotidine) 20 mg one @ bedtime until cough is completely gone for at least a week without the need for cough suppression  Keep appt with Dr Isaiah SergeMannam - call sooner if needed     Called and spoke with patient. Stated that he needs refill on [facility]onase [facility] [address]. I explained to the patient rx will be sent. Pt voiced understanding and had no further questions. Nothing further needed.

## 2015-06-01 ENCOUNTER — Other Ambulatory Visit: Payer: Self-pay | Admitting: Surgery

## 2015-06-01 ENCOUNTER — Ambulatory Visit
Admission: RE | Admit: 2015-06-01 | Discharge: 2015-06-01 | Disposition: A | Discharge status: Home / Self Care | Payer: Medicaid Other | Source: Ambulatory Visit | Attending: Surgery | Admitting: Surgery

## 2015-06-01 DIAGNOSIS — R0789 Other chest pain: Secondary | ICD-10-CM

## 2015-06-15 ENCOUNTER — Other Ambulatory Visit: Payer: Self-pay | Admitting: Adult Health

## 2015-06-19 ENCOUNTER — Ambulatory Visit: Payer: Medicaid Other | Admitting: Pulmonary Disease

## 2015-06-20 ENCOUNTER — Other Ambulatory Visit: Payer: Self-pay | Admitting: Internal Medicine

## 2015-07-02 ENCOUNTER — Encounter: Payer: Self-pay | Admitting: Pulmonary Disease

## 2015-07-02 ENCOUNTER — Other Ambulatory Visit: Payer: Medicaid Other

## 2015-07-02 ENCOUNTER — Ambulatory Visit (INDEPENDENT_AMBULATORY_CARE_PROVIDER_SITE_OTHER): Payer: Medicaid Other | Admitting: Pulmonary Disease

## 2015-07-02 VITALS — BP 108/68 | HR 55 | Ht 68.0 in | Wt 142.4 lb

## 2015-07-02 DIAGNOSIS — J45998 Other asthma: Secondary | ICD-10-CM

## 2015-07-02 DIAGNOSIS — J45909 Unspecified asthma, uncomplicated: Secondary | ICD-10-CM

## 2015-07-02 NOTE — Patient Instructions (Signed)
Continue using the inhalers as prescribed We will order a blood test to check IgE levels.  Return to clinic in 6 months.

## 2015-07-02 NOTE — Progress Notes (Signed)
   Subjective:    Patient ID: [name] [name], male    DOB: 1950-12-17, 65 y.o.   MRN: [id number]  HPI  [name] [name] has severe persistent asthma steroid dependent significant atopic features previously followed by Dr Delford Field. He's had a couple of exacerbations over the past year requiring steroid taper. The last exacerbation was in December. Since then his respirator symptoms have stabilized and he feels well today in office with no dyspnea, wheezing, cough.  He has severe allergies. He had seen an allergist many years in the past and was tested. He was told that he had reaction to dander, pollen, dust.  DATA: PFTs 05/31/14 FVC 2.36 [48%) FEV1 1.41 [38%) F/F 60 Severe obstructive airway disease  CXR 06/01/15 COPD, no acute cardio pulmonary issues  Social History: He quit smoking 30 years ago. No alcohol or drug use  Family History: Strong history of asthma in mother, sibling  Review of Systems Denies any cough, sputum production, wheezing, hemoptysis. Denies any fevers, chills, nausea, vomiting, diarrhea, constipation. Denies any chest pain, palpitations. All other review of systems are negative    Objective:   Physical Exam Blood pressure 108/68, pulse 55, height  (1.727 m), weight 142 lb 6.4 oz (64.592 kg), SpO2 98 %. Gen: No apparent distress Neuro: No gross focal deficits. Neck: No JVD, lymphadenopathy, thyromegaly. RS: Clear, no wheeze, crackles. CVS: S1-S2 heard, no murmurs rubs gallops. Abdomen: Soft, positive bowel sounds. Extremities: No edema    Assessment & Plan:  Severe persistent asthma. Frequent exacerbations.  He is feeling well currently. He'll continue his inhaler including Dulera as prescribed. He's had a CBC this month which does not show any eosinophilia. I will check an IgE level. If his exacerbations worsen then he may be a candidate for Xolair.  Chilton Greathouse MD [facility] Pager [phone] If no answer or after  3pm call: [phone] 07/02/2015, 3:57 PM

## 2015-07-04 LAB — IGE: IGE (IMMUNOGLOBULIN E), SERUM: 6710 kU/L — AB (ref <115)

## 2015-07-17 ENCOUNTER — Ambulatory Visit: Payer: Medicaid Other | Admitting: Neurology

## 2015-07-19 ENCOUNTER — Other Ambulatory Visit: Payer: Self-pay | Admitting: Internal Medicine

## 2015-08-02 ENCOUNTER — Other Ambulatory Visit: Payer: Self-pay | Admitting: Orthopedic Surgery

## 2015-08-07 ENCOUNTER — Other Ambulatory Visit: Payer: Self-pay | Admitting: Orthopedic Surgery

## 2015-08-09 ENCOUNTER — Encounter (HOSPITAL_BASED_OUTPATIENT_CLINIC_OR_DEPARTMENT_OTHER): Payer: Self-pay | Admitting: *Deleted

## 2015-08-15 ENCOUNTER — Other Ambulatory Visit: Payer: Self-pay | Admitting: Orthopedic Surgery

## 2015-09-19 ENCOUNTER — Ambulatory Visit (HOSPITAL_BASED_OUTPATIENT_CLINIC_OR_DEPARTMENT_OTHER): Payer: Medicaid Other | Admitting: Anesthesiology

## 2015-09-19 ENCOUNTER — Encounter (HOSPITAL_BASED_OUTPATIENT_CLINIC_OR_DEPARTMENT_OTHER): Payer: Self-pay | Admitting: Anesthesiology

## 2015-09-19 ENCOUNTER — Ambulatory Visit (HOSPITAL_BASED_OUTPATIENT_CLINIC_OR_DEPARTMENT_OTHER)
Admission: RE | Admit: 2015-09-19 | Discharge: 2015-09-19 | Disposition: A | Discharge status: Home / Self Care | Payer: Medicaid Other | Source: Ambulatory Visit | Attending: Orthopedic Surgery | Admitting: Orthopedic Surgery

## 2015-09-19 ENCOUNTER — Encounter (HOSPITAL_BASED_OUTPATIENT_CLINIC_OR_DEPARTMENT_OTHER)
Admission: RE | Disposition: A | Discharge status: Home / Self Care | Payer: Self-pay | Source: Ambulatory Visit | Attending: Orthopedic Surgery

## 2015-09-19 DIAGNOSIS — Z538 Procedure and treatment not carried out for other reasons: Secondary | ICD-10-CM | POA: Diagnosis not present

## 2015-09-19 DIAGNOSIS — M20091 Other deformity of right finger(s): Secondary | ICD-10-CM | POA: Insufficient documentation

## 2015-09-19 DIAGNOSIS — Z79899 Other long term (current) drug therapy: Secondary | ICD-10-CM | POA: Insufficient documentation

## 2015-09-19 DIAGNOSIS — I10 Essential (primary) hypertension: Secondary | ICD-10-CM | POA: Diagnosis not present

## 2015-09-19 DIAGNOSIS — Z7951 Long term (current) use of inhaled steroids: Secondary | ICD-10-CM | POA: Insufficient documentation

## 2015-09-19 DIAGNOSIS — J449 Chronic obstructive pulmonary disease, unspecified: Secondary | ICD-10-CM | POA: Insufficient documentation

## 2015-09-19 DIAGNOSIS — K219 Gastro-esophageal reflux disease without esophagitis: Secondary | ICD-10-CM | POA: Diagnosis not present

## 2015-09-19 DIAGNOSIS — Z87891 Personal history of nicotine dependence: Secondary | ICD-10-CM | POA: Diagnosis not present

## 2015-09-19 SURGERY — Surgical Case
Laterality: Right

## 2015-09-19 MED ORDER — FENTANYL CITRATE (PF) 100 MCG/2ML IJ SOLN: 50.0000 ug | INTRAMUSCULAR | Status: DC | PRN

## 2015-09-19 MED ORDER — GLYCOPYRROLATE 0.2 MG/ML IJ SOLN: 0.2000 mg | Freq: Once | INTRAMUSCULAR | Status: DC | PRN

## 2015-09-19 MED ORDER — VANCOMYCIN HCL IN DEXTROSE 1-5 GM/200ML-% IV SOLN
INTRAVENOUS | Status: AC
  Filled 2015-09-19: qty 200

## 2015-09-19 MED ORDER — MIDAZOLAM HCL 2 MG/2ML IJ SOLN: 1.0000 mg | INTRAMUSCULAR | Status: DC | PRN

## 2015-09-19 MED ORDER — LACTATED RINGERS IV SOLN
INTRAVENOUS | Status: DC
  Administered 2015-09-19: 08:00:00 via INTRAVENOUS

## 2015-09-19 MED ORDER — SCOPOLAMINE 1 MG/3DAYS TD PT72: 1.0000 | MEDICATED_PATCH | Freq: Once | TRANSDERMAL | Status: DC | PRN

## 2015-09-19 MED ORDER — VANCOMYCIN HCL IN DEXTROSE 1-5 GM/200ML-% IV SOLN: 1000.0000 mg | INTRAVENOUS | Status: DC

## 2015-09-19 MED ORDER — POVIDONE-IODINE 7.5 % EX SOLN: Freq: Once | CUTANEOUS | Status: DC

## 2015-09-19 MED ORDER — BUPIVACAINE HCL (PF) 0.25 % IJ SOLN
INTRAMUSCULAR | Status: AC
  Filled 2015-09-19: qty 30

## 2015-09-19 NOTE — Progress Notes (Signed)
Pt here for surgery, when Dr Mina MarbleWeingold came to speak to pt, Pt told him about a fall the pt had last week. Dr Mina MarbleWeingold told him he should be seen by his PCP before he has his wrist surgery,tosee why he is falling Todays case is an elective procedure and will be rescheduled ASAP

## 2015-09-19 NOTE — Anesthesia Preprocedure Evaluation (Addendum)
Anesthesia Evaluation  Patient identified by MRN, date of birth, ID band Patient awake    Airway Mallampati: II  TM Distance: >3 FB Neck ROM: Full    Dental   Pulmonary shortness of breath, asthma , pneumonia, COPD, former smoker,    breath sounds clear to auscultation       Cardiovascular hypertension,  Rhythm:Regular Rate:Normal     Neuro/Psych    GI/Hepatic hiatal hernia, PUD, GERD  ,(+) Hepatitis -  Endo/Other    Renal/GU Renal disease     Musculoskeletal   Abdominal   Peds  Hematology   Anesthesia Other Findings   Reproductive/Obstetrics                            Anesthesia Physical Anesthesia Plan  ASA: III  Anesthesia Plan: General   Post-op Pain Management:    Induction: Intravenous  Airway Management Planned: LMA  Additional Equipment:   Intra-op Plan:   Post-operative Plan:   Informed Consent: I have reviewed the patients History and Physical, chart, labs and discussed the procedure including the risks, benefits and alternatives for the proposed anesthesia with the patient or authorized representative who has indicated his/her understanding and acceptance.   Dental advisory given  Plan Discussed with: CRNA and Anesthesiologist  Anesthesia Plan Comments:         Anesthesia Quick Evaluation

## 2015-10-01 ENCOUNTER — Encounter: Payer: Self-pay | Admitting: Surgery

## 2015-10-01 DIAGNOSIS — L299 Pruritus, unspecified: Secondary | ICD-10-CM | POA: Insufficient documentation

## 2015-10-02 ENCOUNTER — Other Ambulatory Visit: Payer: Self-pay | Admitting: Internal Medicine

## 2015-11-18 ENCOUNTER — Other Ambulatory Visit: Payer: Self-pay | Admitting: Adult Health

## 2015-11-22 NOTE — Progress Notes (Signed)
Scheduled wrist surgery with Dr Mina MarbleWeingold to be moved to the main OR per Dr. Okey Dupreose MDA. Helmut MusterAlicia at Dr. Ronie SpiesWeingold's office aware.

## 2015-11-29 ENCOUNTER — Telehealth: Payer: Self-pay | Admitting: *Deleted

## 2015-11-29 NOTE — Telephone Encounter (Signed)
-----   Message from Vito BackersSheena H Cox, [place] sent at 07/02/2015  4:03 PM EST ----- Pt needs 6 mo f/u with Mannam. Since pt is blind he cannot read any mail we send him. Would you be able to call pt and schedule him when PM's schedule opens?  Thank you- Zenon MayoSheena

## 2015-11-29 NOTE — Telephone Encounter (Signed)
Pt has pending OV with PM on 12/31/15. Nothing further was needed.

## 2015-12-04 ENCOUNTER — Other Ambulatory Visit: Payer: Self-pay | Admitting: Orthopedic Surgery

## 2015-12-04 ENCOUNTER — Encounter (HOSPITAL_COMMUNITY): Payer: Self-pay | Admitting: *Deleted

## 2015-12-04 NOTE — Progress Notes (Signed)
Anesthesia Chart Review: SAME DAY WORK-UP.  Patient is a 65 year old male scheduled for right write hand flexor tendon lengthening, right median nerve neurolysis on 12/05/15 by Dr. Mina MarbleWeingold. [facility], but anesthesiologist Dr. Okey Dupreose recommended case be moved to main OR, I believe, due to his pulmonary history.  History includes legally blind (left eye prosthesis), severe persistent asthma with frequent exacerbations, former smoker (quit '85), allergic rhinitis, chronic sinusitis, COPD, GERD, hepatitis C, ulcerative colitis, pancreatitis, hiatal hernia, anxiety, depression, migraine headaches, HTN (no meds), BPH, history of facial fractures.   PCP is Arnette FeltsJanece Moore, FNP at [facility]. Larita FifeLynn at Dr. Ronie SpiesWeingold's office to fax records once they receive.  Pulmonologist is Dr. Chilton GreathousePraveen Mannam, previously saw Dr. Sandrea HughsMichael Wert, last visit 07/02/15. He was feeling well at that time.  Meds include albuterol, BC Headache, Dulera, Epipen, Flonase, hydroxyzine, Pred Forte ophthalmic, Zoloft, Flomax, Restoril.  EKG 03/29/15: ST at 114 bpm, probable LAE, minimal ST depression, lateral leads (appears non-specific). PFTs 05/31/14 FVC 2.36 (48%) FEV1 1.41 (38%) FEV1/FVC ratio 60 Severe obstructive airway disease  CXR 06/01/15: COPD. There is no acute cardiopulmonary disease.  ABD AORTA U/S 01/04/14: No evidence of AAA.  He is for labs on arrival. He is a same day work-up, so further evaluation on the day of surgery by his anesthesiologist. Will need to make sure he is not having any acute pulmonary issues (none documented today from his PAT phone interviewing RN).   Velna Ochsllison Kingsley Herandez, PA-C [facility]Anesthesiology Phone [phone] 12/04/2015 11:44 AM

## 2015-12-04 NOTE — Progress Notes (Signed)
Mr Julien GirtMcDaniel is legally Blind has a left prosthetic eye.  Patient denies chest pain.  Patient has 'Aide's" for SS who assist him as needed, one will be driving him home post op and one will stay with him for 1st 24 hours, per patient. I called Claris Che'Margaret' , the assistant that will be bringing him and there was no answer.

## 2015-12-05 ENCOUNTER — Ambulatory Visit (HOSPITAL_COMMUNITY)
Admission: RE | Admit: 2015-12-05 | Discharge: 2015-12-05 | Disposition: A | Discharge status: Home / Self Care | Payer: Medicaid Other | Source: Ambulatory Visit | Attending: Orthopedic Surgery | Admitting: Orthopedic Surgery

## 2015-12-05 ENCOUNTER — Ambulatory Visit (HOSPITAL_COMMUNITY): Payer: Medicaid Other | Admitting: Vascular Surgery

## 2015-12-05 ENCOUNTER — Encounter (HOSPITAL_COMMUNITY): Payer: Self-pay | Admitting: *Deleted

## 2015-12-05 ENCOUNTER — Encounter (HOSPITAL_COMMUNITY)
Admission: RE | Disposition: A | Discharge status: Home / Self Care | Payer: Self-pay | Source: Ambulatory Visit | Attending: Orthopedic Surgery

## 2015-12-05 DIAGNOSIS — G5601 Carpal tunnel syndrome, right upper limb: Secondary | ICD-10-CM | POA: Diagnosis not present

## 2015-12-05 DIAGNOSIS — M20091 Other deformity of right finger(s): Secondary | ICD-10-CM | POA: Diagnosis present

## 2015-12-05 DIAGNOSIS — J449 Chronic obstructive pulmonary disease, unspecified: Secondary | ICD-10-CM | POA: Diagnosis not present

## 2015-12-05 DIAGNOSIS — M24531 Contracture, right wrist: Secondary | ICD-10-CM | POA: Insufficient documentation

## 2015-12-05 DIAGNOSIS — H548 Legal blindness, as defined in USA: Secondary | ICD-10-CM | POA: Diagnosis not present

## 2015-12-05 HISTORY — PX: NEUROLYSIS OF MEDIAN NERVE: SHX6237

## 2015-12-05 HISTORY — PX: NERVE, TENDON AND ARTERY REPAIR: SHX5695

## 2015-12-05 HISTORY — DX: Presence of artificial eye: Z97.0

## 2015-12-05 HISTORY — DX: Personal history of urinary calculi: Z87.442

## 2015-12-05 LAB — CBC
HEMATOCRIT: 41.7 % (ref 39.0–52.0)
HEMOGLOBIN: 13.9 g/dL (ref 13.0–17.0)
MCH: 31.1 pg (ref 26.0–34.0)
MCHC: 33.3 g/dL (ref 30.0–36.0)
MCV: 93.3 fL (ref 78.0–100.0)
Platelets: 239 10*3/uL (ref 150–400)
RBC: 4.47 MIL/uL (ref 4.22–5.81)
RDW: 13.5 % (ref 11.5–15.5)
WBC: 8.4 10*3/uL (ref 4.0–10.5)

## 2015-12-05 LAB — COMPREHENSIVE METABOLIC PANEL
ALBUMIN: 3.7 g/dL (ref 3.5–5.0)
ALT: 29 U/L (ref 17–63)
ANION GAP: 5 (ref 5–15)
AST: 28 U/L (ref 15–41)
Alkaline Phosphatase: 74 U/L (ref 38–126)
BUN: 11 mg/dL (ref 6–20)
CHLORIDE: 113 mmol/L — AB (ref 101–111)
CO2: 22 mmol/L (ref 22–32)
Calcium: 8.9 mg/dL (ref 8.9–10.3)
Creatinine, Ser: 1.15 mg/dL (ref 0.61–1.24)
GFR calc Af Amer: >60 mL/min (ref >60)
GLUCOSE: 94 mg/dL (ref 65–99)
POTASSIUM: 4 mmol/L (ref 3.5–5.1)
Sodium: 140 mmol/L (ref 135–145)
Total Bilirubin: 0.5 mg/dL (ref 0.3–1.2)
Total Protein: 6.5 g/dL (ref 6.5–8.1)

## 2015-12-05 SURGERY — NERVE, TENDON AND ARTERY REPAIR
Anesthesia: General | Laterality: Right

## 2015-12-05 MED ORDER — ACETAMINOPHEN 500 MG PO TABS
1000.0000 mg | ORAL_TABLET | Freq: Four times a day (QID) | ORAL | Status: DC | PRN
  Administered 2015-12-05: 975 mg via ORAL
  Filled 2015-12-05: qty 2

## 2015-12-05 MED ORDER — PHENYLEPHRINE HCL 10 MG/ML IJ SOLN
INTRAMUSCULAR | Status: DC | PRN
  Administered 2015-12-05: 80 ug via INTRAVENOUS

## 2015-12-05 MED ORDER — ONDANSETRON HCL 4 MG/2ML IJ SOLN
INTRAMUSCULAR | Status: AC
  Filled 2015-12-05: qty 2

## 2015-12-05 MED ORDER — MIDAZOLAM HCL 5 MG/5ML IJ SOLN
INTRAMUSCULAR | Status: DC | PRN
  Administered 2015-12-05: 2 mg via INTRAVENOUS

## 2015-12-05 MED ORDER — LIDOCAINE HCL (CARDIAC) 20 MG/ML IV SOLN
INTRAVENOUS | Status: DC | PRN
  Administered 2015-12-05: 80 mg via INTRAVENOUS

## 2015-12-05 MED ORDER — PROMETHAZINE HCL 25 MG/ML IJ SOLN
INTRAMUSCULAR | Status: AC
  Administered 2015-12-05: 6.25 mg via INTRAVENOUS
  Filled 2015-12-05: qty 1

## 2015-12-05 MED ORDER — EPHEDRINE 5 MG/ML INJ
INTRAVENOUS | Status: AC
  Filled 2015-12-05: qty 10

## 2015-12-05 MED ORDER — FENTANYL CITRATE (PF) 100 MCG/2ML IJ SOLN
25.0000 ug | INTRAMUSCULAR | Status: DC | PRN
  Administered 2015-12-05 (×3): 50 ug via INTRAVENOUS

## 2015-12-05 MED ORDER — EPHEDRINE SULFATE 50 MG/ML IJ SOLN
INTRAMUSCULAR | Status: DC | PRN
  Administered 2015-12-05: 10 mg via INTRAVENOUS
  Administered 2015-12-05: 15 mg via INTRAVENOUS

## 2015-12-05 MED ORDER — PROMETHAZINE HCL 25 MG/ML IJ SOLN
6.2500 mg | INTRAMUSCULAR | Status: DC | PRN
  Administered 2015-12-05: 6.25 mg via INTRAVENOUS

## 2015-12-05 MED ORDER — HYDROMORPHONE HCL 1 MG/ML IJ SOLN
0.5000 mg | INTRAMUSCULAR | Status: AC | PRN
  Administered 2015-12-05 (×2): 0.5 mg via INTRAVENOUS

## 2015-12-05 MED ORDER — PROPOFOL 10 MG/ML IV BOLUS
INTRAVENOUS | Status: AC
  Filled 2015-12-05: qty 20

## 2015-12-05 MED ORDER — PROPOFOL 10 MG/ML IV BOLUS
INTRAVENOUS | Status: DC | PRN
  Administered 2015-12-05: 130 mg via INTRAVENOUS

## 2015-12-05 MED ORDER — OXYCODONE-ACETAMINOPHEN 5-325 MG PO TABS: 1.0000 | ORAL_TABLET | ORAL | Status: DC | PRN

## 2015-12-05 MED ORDER — LACTATED RINGERS IV SOLN
INTRAVENOUS | Status: DC | PRN
Start: 2015-12-05 — End: 2015-12-05
  Administered 2015-12-05 (×2): via INTRAVENOUS

## 2015-12-05 MED ORDER — FENTANYL CITRATE (PF) 100 MCG/2ML IJ SOLN
INTRAMUSCULAR | Status: AC
  Administered 2015-12-05: 50 ug via INTRAVENOUS
  Filled 2015-12-05: qty 2

## 2015-12-05 MED ORDER — HYDROMORPHONE HCL 1 MG/ML IJ SOLN
INTRAMUSCULAR | Status: AC
  Administered 2015-12-05: 0.5 mg via INTRAVENOUS
  Filled 2015-12-05: qty 1

## 2015-12-05 MED ORDER — ALBUTEROL SULFATE HFA 108 (90 BASE) MCG/ACT IN AERS
INHALATION_SPRAY | RESPIRATORY_TRACT | Status: DC | PRN
  Administered 2015-12-05 (×2): 2 via RESPIRATORY_TRACT

## 2015-12-05 MED ORDER — ACETAMINOPHEN 325 MG PO TABS
ORAL_TABLET | ORAL | Status: AC
  Administered 2015-12-05: 975 mg via ORAL
  Filled 2015-12-05: qty 3

## 2015-12-05 MED ORDER — MIDAZOLAM HCL 2 MG/2ML IJ SOLN
INTRAMUSCULAR | Status: AC
  Filled 2015-12-05: qty 2

## 2015-12-05 MED ORDER — BUPIVACAINE HCL (PF) 0.25 % IJ SOLN
INTRAMUSCULAR | Status: DC | PRN
  Administered 2015-12-05: 6 mL

## 2015-12-05 MED ORDER — LIDOCAINE HCL (PF) 1 % IJ SOLN
INTRAMUSCULAR | Status: AC
  Filled 2015-12-05: qty 30

## 2015-12-05 MED ORDER — OXYCODONE-ACETAMINOPHEN 5-325 MG PO TABS
ORAL_TABLET | ORAL | Status: AC
  Administered 2015-12-05: 1 via ORAL
  Filled 2015-12-05: qty 1

## 2015-12-05 MED ORDER — LIDOCAINE 2% (20 MG/ML) 5 ML SYRINGE
INTRAMUSCULAR | Status: AC
  Filled 2015-12-05: qty 5

## 2015-12-05 MED ORDER — ONDANSETRON HCL 4 MG/2ML IJ SOLN
INTRAMUSCULAR | Status: DC | PRN
  Administered 2015-12-05: 4 mg via INTRAVENOUS

## 2015-12-05 MED ORDER — BUPIVACAINE HCL (PF) 0.25 % IJ SOLN
INTRAMUSCULAR | Status: AC
  Filled 2015-12-05: qty 30

## 2015-12-05 MED ORDER — FENTANYL CITRATE (PF) 100 MCG/2ML IJ SOLN
INTRAMUSCULAR | Status: DC | PRN
  Administered 2015-12-05: 150 ug via INTRAVENOUS
  Administered 2015-12-05 (×2): 50 ug via INTRAVENOUS

## 2015-12-05 MED ORDER — 0.9 % SODIUM CHLORIDE (POUR BTL) OPTIME
TOPICAL | Status: DC | PRN
  Administered 2015-12-05: 1000 mL

## 2015-12-05 MED ORDER — VANCOMYCIN HCL IN DEXTROSE 1-5 GM/200ML-% IV SOLN
1000.0000 mg | INTRAVENOUS | Status: AC
  Administered 2015-12-05: 1000 mg via INTRAVENOUS
  Filled 2015-12-05: qty 200

## 2015-12-05 MED ORDER — OXYCODONE-ACETAMINOPHEN 5-325 MG PO TABS
1.0000 | ORAL_TABLET | ORAL | Status: DC | PRN
  Administered 2015-12-05: 1 via ORAL

## 2015-12-05 MED ORDER — FENTANYL CITRATE (PF) 250 MCG/5ML IJ SOLN
INTRAMUSCULAR | Status: AC
  Filled 2015-12-05: qty 5

## 2015-12-05 SURGICAL SUPPLY — 36 items
BANDAGE ACE 4X5 VEL STRL LF (GAUZE/BANDAGES/DRESSINGS) ×3 IMPLANT
BLADE SURG 15 STRL LF DISP TIS (BLADE) ×1 IMPLANT
BLADE SURG 15 STRL SS (BLADE) ×3
BNDG CMPR 9X4 STRL LF SNTH (GAUZE/BANDAGES/DRESSINGS) ×1
BNDG ESMARK 4X9 LF (GAUZE/BANDAGES/DRESSINGS) ×3 IMPLANT
BNDG GAUZE ELAST 4 BULKY (GAUZE/BANDAGES/DRESSINGS) ×3 IMPLANT
CORDS BIPOLAR (ELECTRODE) ×3 IMPLANT
COVER SURGICAL LIGHT HANDLE (MISCELLANEOUS) ×3 IMPLANT
CUFF TOURNIQUET SINGLE 18IN (TOURNIQUET CUFF) ×3 IMPLANT
DRAPE SURG 17X11 SM STRL (DRAPES) ×3 IMPLANT
DURAPREP 26ML APPLICATOR (WOUND CARE) ×3 IMPLANT
GAUZE SPONGE 4X4 12PLY STRL (GAUZE/BANDAGES/DRESSINGS) ×3 IMPLANT
GAUZE XEROFORM 1X8 LF (GAUZE/BANDAGES/DRESSINGS) ×3 IMPLANT
GLOVE SURG SYN 8.0 (GLOVE) ×3 IMPLANT
GOWN STRL REUS W/ TWL LRG LVL3 (GOWN DISPOSABLE) ×1 IMPLANT
GOWN STRL REUS W/ TWL XL LVL3 (GOWN DISPOSABLE) ×1 IMPLANT
GOWN STRL REUS W/TWL LRG LVL3 (GOWN DISPOSABLE) ×3
GOWN STRL REUS W/TWL XL LVL3 (GOWN DISPOSABLE) ×3
KIT BASIN OR (CUSTOM PROCEDURE TRAY) ×3 IMPLANT
KIT ROOM TURNOVER OR (KITS) ×3 IMPLANT
NEEDLE HYPO 25GX1X1/2 BEV (NEEDLE) ×3 IMPLANT
NS IRRIG 1000ML POUR BTL (IV SOLUTION) ×3 IMPLANT
PACK ORTHO EXTREMITY (CUSTOM PROCEDURE TRAY) ×3 IMPLANT
PAD ARMBOARD 7.5X6 YLW CONV (MISCELLANEOUS) ×3 IMPLANT
PAD CAST 4YDX4 CTTN HI CHSV (CAST SUPPLIES) ×1 IMPLANT
PADDING CAST COTTON 4X4 STRL (CAST SUPPLIES) ×3
SPLINT FIBERGLASS 4X15 (CAST SUPPLIES) ×3 IMPLANT
SUT ETHILON 4 0 PS 2 18 (SUTURE) ×9 IMPLANT
SUT FIBER WIRE 4.0 (SUTURE) ×3 IMPLANT
SUT FIBERWIRE 3-0 18 TAPR NDL (SUTURE) ×3
SUT FIBERWIRE 4-0 18 TAPR NDL (SUTURE) ×6
SUTURE FIBERWR 3-0 18 TAPR NDL (SUTURE) ×1 IMPLANT
SUTURE FIBERWR 4-0 18 TAPR NDL (SUTURE) ×2 IMPLANT
SYR CONTROL 10ML LL (SYRINGE) ×3 IMPLANT
TOWEL OR 17X26 10 PK STRL BLUE (TOWEL DISPOSABLE) ×3 IMPLANT
UNDERPAD 30X30 INCONTINENT (UNDERPADS AND DIAPERS) ×3 IMPLANT

## 2015-12-05 NOTE — Op Note (Signed)
See note [phone]

## 2015-12-05 NOTE — H&P (Signed)
[name] [name] is an 65 y.o. male.   Chief Complaint: right wrist  Numbness and decreased extension of digits HPI: as above with decreased motion and increased numbness in median distribution   Past Medical History  Diagnosis Date  . 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 Cough   . Esophageal reflux   . Osteopenia   . Personal history of other diseases of digestive system   . Contact dermatitis and other eczema, due to unspecified cause   . Unspecified sinusitis (chronic)   . Allergic rhinitis   . Unspecified asthma(493.90)   . COPD (chronic obstructive pulmonary disease) (HCC)   . Seasonal allergies   . Ulcerative colitis   . H/O hiatal hernia   . Arthritis     spine  . Anxiety   . Multiple open wounds     "from esczema- scratching"  . Multiple facial bone fractures (HCC)   . History of bronchitis   . Pneumonia     in past had several times  . Shortness of breath     "sometime"  . Chronic lower back pain   . Depression     patient constantly hitting left side of face "due to pain"  . Pancreatitis   . Headache     PMH: migraines  . Hypertension     not on medication at this time  . Enlarged prostate   . History of kidney stones   . Hep C w/o coma, chronic (HCC) early 2013  . History of eye prosthesis     Past Surgical History  Procedure Laterality Date  . Litrotripsy    . Orif distal radius fracture  09/12/11    right  . Fracture surgery    . Metacarpal pinning Right     right hand  . Patella fracture surgery      right  . Tonsillectomy and adenoidectomy    . Hardware removal  10/09/2011    Procedure: HARDWARE REMOVAL;  Surgeon: Nadara Mustard, MD;  Location: [facility] OR;  Service: Orthopedics;  Laterality: Right;  Removal Deep Hardware Right Wrist, placement antibiotic beads.  . Orif wrist fracture Right 06/15/2013    Procedure: RIGHT RADIUS PSTEOTOMY, DISTAL ULNA RESECTION, AND DIGITAL FLEXOR LENGTHENING AS NEEDED;  Surgeon: Marlowe Shores, MD;  Location: [facility];  Service: Orthopedics;  Laterality: Right;  . Carpal tunnel release Right 06/15/2013    Procedure: RIGHT CARPAL TUNNEL RELEASE;  Surgeon: Marlowe Shores, MD;  Location: [facility];  Service: Orthopedics;  Laterality: Right;  . Inguinal hernia repair Left     age 69  . Colonoscopy w/ biopsies and polypectomy    . Radiology with anesthesia N/A 03/09/2014    Procedure: MRI - Cervical and Lumbar without Contrast;  Surgeon: Medication Radiologist, MD;  Location: [facility] OR;  Service: Radiology;  Laterality: N/A;  . Radiology with anesthesia N/A 04/03/2015    Procedure: MRI OF BRAIN    (RADIOLOGY WITH ANESTHESIA);  Surgeon: Medication Radiologist, MD;  Location: [facility] OR;  Service: Radiology;  Laterality: N/A;  . Eye surgery      3 Corneal transplant (bilateral eyes).    . Enucleation Left     Family History  Problem Relation Age of Onset  . Allergies Mother     atopic  . Asthma Mother   . Heart failure Mother     CHF  . Other Father   . Anesthesia problems  Neg Hx    Social History:  reports that he quit smoking about 32 years ago. His smoking use included Cigarettes and Pipe. He has a 3 pack-year smoking history. He has never used smokeless tobacco. He reports that he drinks alcohol. He reports that he does not use illicit drugs.  Allergies:  Allergies  Allergen Reactions  . Bacitracin-Polymyxin B Other (See Comments)    Polysporin allergy per opthalmologist.   . Bee Venom Anaphylaxis  . Codeine Other (See Comments)    Makes patient clear throat   . Norco [Hydrocodone-Acetaminophen] Other (See Comments)    causes frontal headaches  . Penicillins Other (See Comments)    Childhood allergy/ makes patient clear throat a lot. Has patient had a PCN reaction causing immediate rash, facial/tongue/throat swelling, SOB or lightheadedness with hypotension:  Has patient had a PCN reaction causing severe rash involving mucus  membranes or skin necrosis:  Has patient had a PCN reaction that required hospitalization  Has patient had a PCN reaction occurring within the last 10 years: If all of the above answers are "NO", then may proceed with Cephalosporin use.   Marland Kitchen Shellfish-Derived Products Anaphylaxis and Swelling    All seafood, sea and freshwater  . Tape Hives and Other (See Comments)    "plastic tape"  . Other Hives    Dust, pollen and mold.   . Sulfate   . Chlorhexidine Itching and Rash    Medications Prior to Admission  Medication Sig Dispense Refill  . albuterol (PROVENTIL HFA;VENTOLIN HFA) 108 (90 BASE) MCG/ACT inhaler Inhale 2 puffs into the lungs every 6 (six) hours as needed for wheezing or shortness of breath (wheezing & shortness of breath). 1 Inhaler 0  . calcium-vitamin D (CALCIUM 500/D) 500-200 MG-UNIT tablet Take 1 tablet by mouth daily.    . diazepam (VALIUM) 10 MG tablet Take 1 tablet (10 mg total) by mouth 3 (three) times daily. 10 tablet 0  . DULERA 200-5 MCG/ACT AERO INHALE 2 PUFFS INTO THE LUNGS 2 TIMES DAILY 1 Inhaler 5  . EPINEPHrine (EPIPEN 2-PAK) 0.3 mg/0.3 mL IJ SOAJ injection Inject 0.3 mg into the muscle once.    . fluticasone (FLONASE) 50 MCG/ACT nasal spray USE 2 SPRAYS IN EACH NOSTRIL TWICE DAILY. 16 g 1  . hydrOXYzine (ATARAX/VISTARIL) 25 MG tablet Take 1 tablet (25 mg total) by mouth 3 (three) times daily as needed for itching (itching). 30 tablet 0  . prednisoLONE acetate (PRED FORTE) 1 % ophthalmic suspension Place 1 drop into both eyes daily.     . sertraline (ZOLOFT) 100 MG tablet Take 2 tablets (200 mg total) by mouth daily. 30 tablet 0  . tamsulosin (FLOMAX) 0.4 MG CAPS capsule Take 0.4 mg by mouth daily.  3  . temazepam (RESTORIL) 15 MG capsule Take 15 mg by mouth at bedtime as needed for sleep.    Marland Kitchen albuterol (PROVENTIL) (2.5 MG/3ML) 0.083% nebulizer solution Take 3 mLs (2.5 mg total) by nebulization every 4 (four) hours as needed for wheezing or shortness of breath.  360 mL 1  . Aspirin-Salicylamide-Caffeine (BC HEADACHE PO) Take 2 packets by mouth daily as needed (headaches).     . clobetasol cream (TEMOVATE) 0.05 % APPLY 1 APPLICATION ON THE SKIN TWICE A DAY TO WORST AREAS FOR 7-10 DAYS  1  . Fluocinolone Acetonide (DERMA-SMOOTHE/FS BODY) 0.01 % OIL Apply 1 application topically daily.  3    Results for orders placed or performed during the hospital encounter of 12/05/15 (from the past  48 hour(s))  Comprehensive metabolic panel     Status: Abnormal   Collection Time: 12/05/15 10:08 AM  Result Value Ref Range   Sodium 140 135 - 145 mmol/L   Potassium 4.0 3.5 - 5.1 mmol/L   Chloride 113 (H) 101 - 111 mmol/L   CO2 22 22 - 32 mmol/L   Glucose, Bld 94 65 - 99 mg/dL   BUN 11 6 - 20 mg/dL   Creatinine, Ser 1.15 0.61 - 1.24 mg/dL   Calcium 8.9 8.9 - 10.3 mg/dL   Total Protein 6.5 6.5 - 8.1 g/dL   Albumin 3.7 3.5 - 5.0 g/dL   AST 28 15 - 41 U/L   ALT 29 17 - 63 U/L   Alkaline Phosphatase 74 38 - 126 U/L   Total Bilirubin 0.5 0.3 - 1.2 mg/dL   GFR calc non Af Amer >60 >60 mL/min   GFR calc Af Amer >60 >60 mL/min    Comment: (NOTE) The eGFR has been calculated using the CKD EPI equation. This calculation has not been validated in all clinical situations. eGFR's persistently <60 mL/min signify possible Chronic Kidney Disease.    Anion gap 5 5 - 15  CBC     Status: None   Collection Time: 12/05/15 10:08 AM  Result Value Ref Range   WBC 8.4 4.0 - 10.5 K/uL   RBC 4.47 4.22 - 5.81 MIL/uL   Hemoglobin 13.9 13.0 - 17.0 g/dL   HCT 41.7 39.0 - 52.0 %   MCV 93.3 78.0 - 100.0 fL   MCH 31.1 26.0 - 34.0 pg   MCHC 33.3 30.0 - 36.0 g/dL   RDW 13.5 11.5 - 15.5 %   Platelets 239 150 - 400 K/uL   No results found.  Review of Systems  All other systems reviewed and are negative.   Blood pressure 171/95, pulse 84, temperature 97.8 F (36.6 C), temperature source Oral, resp. rate 20, height '5\' 11"'$  (1.803 m), weight 68.04 kg (150 lb), SpO2 99 %. Physical  Exam  Constitutional: He is oriented to person, place, and time. He appears well-developed and well-nourished.  HENT:  Head: Normocephalic and atraumatic.  Neck: Normal range of motion.  Cardiovascular: Normal rate.   Respiratory: Effort normal.  Musculoskeletal:       Right wrist: He exhibits tenderness and deformity.  Right wrist flexion deformity with decreased median sensation   Neurological: He is alert and oriented to person, place, and time.  Skin: Skin is warm.  Psychiatric: He has a normal mood and affect. His behavior is normal. Judgment and thought content normal.     Assessment/Plan As above  Plan tenoylsis and neurolysis as needed  Schuyler Amor, MD 12/05/2015, 12:11 PM

## 2015-12-05 NOTE — Transfer of Care (Signed)
Immediate Anesthesia Transfer of Care Note  Patient: [name] [name]  Procedure(s) Performed: Procedure(s): RIGHT WRIST, HAND FLEXOR TENDON LENGTHENING (Right) RIGHT MEDIAN NERVE NEUROLYSIS  (Right)  Patient Location: PACU  Anesthesia Type:General  Level of Consciousness: awake, alert , oriented and patient cooperative  Airway & Oxygen Therapy: Patient Spontanous Breathing and Patient connected to face mask oxygen  Post-op Assessment: Report given to RN and Post -op Vital signs reviewed and stable  Post vital signs: Reviewed and stable  Last Vitals:  Filed Vitals:   12/05/15 1000  BP: 171/95  Pulse: 84  Temp: 36.6 C  Resp: 20    Last Pain: There were no vitals filed for this visit.    Patients Stated Pain Goal: 5 (12/05/15 1047)  Complications: No apparent anesthesia complications

## 2015-12-05 NOTE — Anesthesia Procedure Notes (Signed)
Procedure Name: LMA Insertion Date/Time: 12/05/2015 12:25 PM Performed by: Rosiland OzMEYERS, Leib Elahi Pre-anesthesia Checklist: Patient identified, Emergency Drugs available, Suction available, Patient being monitored and Timeout performed Patient Re-evaluated:Patient Re-evaluated prior to inductionOxygen Delivery Method: Circle system utilized Preoxygenation: Pre-oxygenation with 100% oxygen Intubation Type: IV induction LMA Size: 5.0 Placement Confirmation: positive ETCO2 and breath sounds checked- equal and bilateral Tube secured with: Tape Dental Injury: Teeth and Oropharynx as per pre-operative assessment

## 2015-12-05 NOTE — Anesthesia Preprocedure Evaluation (Addendum)
Anesthesia Evaluation  Patient identified by MRN, date of birth, ID band Patient awake    Reviewed: Allergy & Precautions, NPO status , Patient's Chart, lab work & pertinent test results  History of Anesthesia Complications Negative for: history of anesthetic complications  Airway Mallampati: II  TM Distance: >3 FB Neck ROM: Full    Dental  (+) Edentulous Upper, Edentulous Lower, Dental Advisory Given   Pulmonary asthma , COPD, former smoker,    Pulmonary exam normal        Cardiovascular hypertension, Normal cardiovascular exam     Neuro/Psych  Headaches, PSYCHIATRIC DISORDERS Anxiety Depression    GI/Hepatic hiatal hernia, GERD  ,(+) Hepatitis -, C  Endo/Other    Renal/GU negative Renal ROS     Musculoskeletal   Abdominal   Peds  Hematology   Anesthesia Other Findings   Reproductive/Obstetrics                           Anesthesia Physical Anesthesia Plan  ASA: III  Anesthesia Plan: General   Post-op Pain Management:    Induction: Intravenous  Airway Management Planned: LMA  Additional Equipment:   Intra-op Plan:   Post-operative Plan: Extubation in OR  Informed Consent: I have reviewed the patients History and Physical, chart, labs and discussed the procedure including the risks, benefits and alternatives for the proposed anesthesia with the patient or authorized representative who has indicated his/her understanding and acceptance.   Dental advisory given  Plan Discussed with: Anesthesiologist  Anesthesia Plan Comments: (Pt refuses SCB)       Anesthesia Quick Evaluation

## 2015-12-06 ENCOUNTER — Encounter (HOSPITAL_COMMUNITY): Payer: Self-pay | Admitting: Orthopedic Surgery

## 2015-12-06 NOTE — Anesthesia Postprocedure Evaluation (Signed)
Anesthesia Post Note  Patient: [name] [name]  Procedure(s) Performed: Procedure(s) (LRB): RIGHT WRIST, HAND FLEXOR TENDON LENGTHENING (Right) RIGHT MEDIAN NERVE NEUROLYSIS  (Right)  Patient location during evaluation: PACU Anesthesia Type: General Level of consciousness: sedated Pain management: pain level controlled Vital Signs Assessment: post-procedure vital signs reviewed and stable Respiratory status: spontaneous breathing and respiratory function stable Cardiovascular status: stable Anesthetic complications: no                Vanette Noguchi DANIEL

## 2015-12-06 NOTE — Op Note (Signed)
NAME:  [name] [name], [name] [name]              ACCOUNT NO.:  [id number]  MEDICAL RECORD NO.:  [id number]  LOCATION:  [facility]                         FACILITY:  [facility]  PHYSICIAN:  Artist PaisMatthew A. Gessica Jawad, M.D.DATE OF BIRTH:  28-Sep-1950  DATE OF PROCEDURE:  12/05/2015 DATE OF DISCHARGE:  12/05/2015                              OPERATIVE REPORT   PREOPERATIVE DIAGNOSES:  Right wrist flexion contracture; right index, long, ring, and small finger flexion contractures; and compression neuropathy of median nerve.  POSTOPERATIVE DIAGNOSES:  Right wrist flexion contracture; right index, long, ring, and small finger flexion contractures; and compression neuropathy of median nerve.  PROCEDURE:  Exploration with extensive neurolysis of median nerve, distal forearm, and wrist under loupe magnification as well as Z- lengthening of the flexor carpi radialis tendon, the flexor carpi ulnaris tendon, and the superficial flexors to the index, long, ring, and small finger and the deep flexors to the index and long finger.  SURGEON:  Artist PaisMatthew A. Mina MarbleWeingold, MD.  ASSISTANT:  None.  ANESTHESIA:  General.  COMPLICATIONS:  None.  DRAINS:  None.  DESCRIPTION OF PROCEDURE:  The patient was taken to the operating suite. After induction of adequate general anesthetic, right upper extremity was prepped and draped in sterile fashion.  Esmarch was used to exsanguinate the limb.  Tourniquet was inflated to 250 mmHg.  At this point in time, his hand was fully supinated and we incised in a Brunner fashion from the mid to distal third of the forearm to the wrist flexion crease.  Flaps were raised.  We identified the flexor carpi radialis tendon along the radial side of the incision and the flexor carpi ulnaris tendon and ulnar neurovascular structure, structures on the ulnar side of the incision. Once this was done, we identified the median nerve proximally in normal anatomic alignment with the flexors.  We traced into a zone  of severe scarring from the area 6 cm from the wrist crease to the edge of the transverse carpal ligament.  Under loupe magnification, for least 25-30 minutes, we carefully dissected the median nerve at a significant scar and did an external neurolysis under loupe magnification.  Once this was done, using a 15 blade, we did Z- lengthenings of the flexor carpi ulnaris and the flexor carpi radialis tendons and fixed them with 3-0 FiberWire.  We then did Z-lengthenings of the index, long, ring, and small superficial flexors and the index and long and deep flexors using the same Z-technique and fixing with the same 3-0 FiberWire suture.  At the end of the procedure, we were able to get the wrist in neutral and the fingers out of the palm.  The wound was then thoroughly irrigated.  We loosely closed with 4 nylon.  Xeroform, 4x4s, fluffs, and a volar splint was applied.  The patient tolerated all procedures well and went to recovery room in stable fashion.     Artist PaisMatthew A. Mina MarbleWeingold, M.D.     MAW/MEDQ  D:  12/05/2015  T:  12/06/2015  Job:  [id number]

## 2015-12-31 ENCOUNTER — Ambulatory Visit: Payer: Medicaid Other | Admitting: Pulmonary Disease

## 2016-01-01 ENCOUNTER — Encounter (HOSPITAL_COMMUNITY): Payer: Self-pay | Admitting: Emergency Medicine

## 2016-01-01 ENCOUNTER — Emergency Department (HOSPITAL_COMMUNITY)
Admission: EM | Admit: 2016-01-01 | Discharge: 2016-01-01 | Disposition: A | Discharge status: Home / Self Care | Payer: Medicaid Other | Attending: Emergency Medicine | Admitting: Emergency Medicine

## 2016-01-01 ENCOUNTER — Emergency Department (HOSPITAL_COMMUNITY): Payer: Medicaid Other

## 2016-01-01 DIAGNOSIS — W19XXXA Unspecified fall, initial encounter: Secondary | ICD-10-CM | POA: Insufficient documentation

## 2016-01-01 DIAGNOSIS — Y999 Unspecified external cause status: Secondary | ICD-10-CM | POA: Insufficient documentation

## 2016-01-01 DIAGNOSIS — Y939 Activity, unspecified: Secondary | ICD-10-CM | POA: Diagnosis not present

## 2016-01-01 DIAGNOSIS — S79912A Unspecified injury of left hip, initial encounter: Secondary | ICD-10-CM | POA: Diagnosis present

## 2016-01-01 DIAGNOSIS — Y929 Unspecified place or not applicable: Secondary | ICD-10-CM | POA: Diagnosis not present

## 2016-01-01 DIAGNOSIS — Z5321 Procedure and treatment not carried out due to patient leaving prior to being seen by health care provider: Secondary | ICD-10-CM | POA: Diagnosis not present

## 2016-01-01 NOTE — ED Notes (Signed)
Pt called out stating he was leaving, this emt went in the room and pt stated he was leaving and pt walked out of room.  Pt was encouraged to stay to be seen.

## 2016-01-01 NOTE — ED Notes (Signed)
Caregiver - Terrial RhodesMargaret Brack - requests to be called when patient is ready for pick up at [phone].  Number has been added to demographics section.

## 2016-01-01 NOTE — ED Notes (Signed)
Pt upset about wait time, pt requesting to see a doctor promptly before he leaves.

## 2016-01-01 NOTE — ED Notes (Signed)
Pt left after triage prior to being seen by provider. Pt ambulated to waiting room to wait for ride.

## 2016-01-01 NOTE — ED Notes (Signed)
Pt ambulatory to nurse first desk inquiring about wait times. Pt updated room being cleaned at present. Pt NAD at present.

## 2016-01-01 NOTE — ED Triage Notes (Signed)
Pt st's he fell yesterday landing on his left hip.  St's pain continues to get worse.  Wt bearing is very painful.  Pt is legally blind.

## 2016-01-06 ENCOUNTER — Encounter (HOSPITAL_COMMUNITY): Payer: Self-pay

## 2016-01-06 ENCOUNTER — Emergency Department (HOSPITAL_COMMUNITY)
Admission: EM | Admit: 2016-01-06 | Discharge: 2016-01-07 | Disposition: A | Discharge status: Home / Self Care | Payer: Medicaid Other | Attending: Emergency Medicine | Admitting: Emergency Medicine

## 2016-01-06 DIAGNOSIS — J449 Chronic obstructive pulmonary disease, unspecified: Secondary | ICD-10-CM | POA: Diagnosis not present

## 2016-01-06 DIAGNOSIS — K5641 Fecal impaction: Secondary | ICD-10-CM

## 2016-01-06 DIAGNOSIS — Z7951 Long term (current) use of inhaled steroids: Secondary | ICD-10-CM | POA: Diagnosis not present

## 2016-01-06 DIAGNOSIS — Z79899 Other long term (current) drug therapy: Secondary | ICD-10-CM | POA: Diagnosis not present

## 2016-01-06 DIAGNOSIS — J45901 Unspecified asthma with (acute) exacerbation: Secondary | ICD-10-CM | POA: Insufficient documentation

## 2016-01-06 DIAGNOSIS — R339 Retention of urine, unspecified: Secondary | ICD-10-CM

## 2016-01-06 DIAGNOSIS — Z87891 Personal history of nicotine dependence: Secondary | ICD-10-CM | POA: Diagnosis not present

## 2016-01-06 DIAGNOSIS — I10 Essential (primary) hypertension: Secondary | ICD-10-CM | POA: Diagnosis not present

## 2016-01-06 DIAGNOSIS — R110 Nausea: Secondary | ICD-10-CM | POA: Diagnosis not present

## 2016-01-06 DIAGNOSIS — R350 Frequency of micturition: Secondary | ICD-10-CM | POA: Diagnosis present

## 2016-01-06 LAB — URINALYSIS, ROUTINE W REFLEX MICROSCOPIC
Bilirubin Urine: NEGATIVE
Glucose, UA: NEGATIVE mg/dL
Hgb urine dipstick: NEGATIVE
Ketones, ur: NEGATIVE mg/dL
Leukocytes, UA: NEGATIVE
Nitrite: NEGATIVE
Protein, ur: NEGATIVE mg/dL
Specific Gravity, Urine: 1.013 (ref 1.005–1.030)
pH: 7 (ref 5.0–8.0)

## 2016-01-06 LAB — BASIC METABOLIC PANEL
Anion gap: 11 (ref 5–15)
BUN: 15 mg/dL (ref 6–20)
CHLORIDE: 109 mmol/L (ref 101–111)
CO2: 22 mmol/L (ref 22–32)
Calcium: 9.8 mg/dL (ref 8.9–10.3)
Creatinine, Ser: 1.13 mg/dL (ref 0.61–1.24)
GFR calc Af Amer: >60 mL/min (ref >60)
GFR calc non Af Amer: >60 mL/min (ref >60)
GLUCOSE: 113 mg/dL — AB (ref 65–99)
POTASSIUM: 3.6 mmol/L (ref 3.5–5.1)
SODIUM: 142 mmol/L (ref 135–145)

## 2016-01-06 MED ORDER — MINERAL OIL RE ENEM
1.0000 | ENEMA | Freq: Once | RECTAL | Status: AC
  Administered 2016-01-06: 1 via RECTAL
  Filled 2016-01-06: qty 1

## 2016-01-06 MED ORDER — FLEET ENEMA 7-19 GM/118ML RE ENEM
1.0000 | ENEMA | Freq: Once | RECTAL | Status: AC
  Administered 2016-01-06: 1 via RECTAL
  Filled 2016-01-06: qty 1

## 2016-01-06 NOTE — ED Notes (Signed)
No respiratory or acute distress noted alert and oriented x 3 call light in reach no reaction to medication noted. 

## 2016-01-06 NOTE — ED Notes (Signed)
Bed: [id number] Expected date:  Expected time:  Means of arrival:  Comments: EMS 65yo M painful urination / abd pain / constipation

## 2016-01-06 NOTE — ED Triage Notes (Signed)
Abdominal pain with urinary frequency 3 hrs PTA no N/V no fever.

## 2016-01-06 NOTE — ED Provider Notes (Signed)
[facility]-EMERGENCY DEPT Provider Note   CSN: [id number] Arrival date & time: 01/06/16  1946  By signing my name below, I, Phillis HaggisGabriella Gaje, attest that this documentation has been prepared under the direction and in the presence of Cheri FowlerKayla Ileah Falkenstein, PA-C. Electronically Signed: Phillis HaggisGabriella Gaje, ED Scribe. 01/06/16. 8:25 PM.  History   Chief Complaint Chief Complaint  Patient presents with  . Urinary Frequency    about 3 hours PTA no N/V with dysuria and frequent urination  . Abdominal Pain   The history is provided by the patient. No language interpreter was used.   HPI Comments: [name] [name] is a 65 y.o. Male with a hx of COPD, Hepatitis C, HTN, enlarged prostate and kidney stones brought in by EMS who presents to the Emergency Department complaining of intermittent abdominal pain onset 4 hours ago. Pt reports associated decreased urine output and nausea. Pt states that he woke up, was unable to urinate much or have a BM. His last normal BM was yesterday. He states that he tried to perform manual disimpaction, but was unable to. He reports relief with the foley catheter in the ED. Pt was recently seen for a fall and states that he just finished a course of narcotics. Pt does not use stool softeners. He denies fever, vomiting, rectal pain, rectal bleeding, melena, hematochezia, dysuria, hematuria, or back pain/flank pain.  This has happened before and he had to have a foley catheter inserted.   Past Medical History:  Diagnosis Date  . Allergic rhinitis   . Anxiety   . Arthritis    spine  . Chronic lower back pain   . Contact dermatitis and other eczema, due to unspecified cause   . COPD (chronic obstructive pulmonary disease) (HCC)   . Cough   . Depression    patient constantly hitting left side of face "due to pain"  . Enlarged prostate   . Esophageal reflux   . H/O hiatal hernia   . Headache    PMH: migraines  . Hep C w/o coma, chronic (HCC) early 2013  . History of bronchitis   .  History of eye prosthesis   . History of kidney stones   . Hypertension    not on medication at this time  . 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 Multiple facial bone fractures (HCC)   . Multiple open wounds    "from esczema- scratching"  . Osteopenia   . Pancreatitis   . Personal history of other diseases of digestive system   . Pneumonia    in past had several times  . Seasonal allergies   . Shortness of breath    "sometime"  . Ulcerative colitis   . Unspecified asthma(493.90)   . Unspecified sinusitis (chronic)     Patient Active Problem List   Diagnosis Date Noted  . Pruritus 10/01/2015  . Acute maxillary sinusitis 06/03/2014  . Severe asthma   . Acute on chronic respiratory failure (HCC) 05/31/2014  . Chronic cough 05/31/2014  . Asthma with acute exacerbation 05/31/2014  . Chronic pain syndrome 05/31/2014  . Malnutrition of moderate degree (HCC) 05/30/2014  . Hypokalemia 05/28/2014  . SOB (shortness of breath) 05/28/2014  . Weakness of back 02/03/2014  . Weakness of both legs 02/03/2014  . Gait instability 12/01/2013  . Allergic contact dermatitis 12/24/2011  . 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 Wrist joint infection (HCC) 10/10/2011    Class: Diagnosis of  . Urinary retention  09/16/2011  . 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 TACHYCARDIA 07/16/2010  . BLINDNESS 10/27/2008  . Sinusitis, chronic 08/03/2007  . Allergic rhinitis 08/03/2007  . Asthma 08/03/2007  . Esophageal reflux 08/03/2007  . ECZEMA 08/03/2007  . OSTEOPENIA 08/03/2007  . ULCERATIVE COLITIS, HX OF 08/03/2007    Past Surgical History:  Procedure Laterality Date  . CARPAL TUNNEL RELEASE Right 06/15/2013   Procedure: RIGHT CARPAL TUNNEL RELEASE;  Surgeon: Marlowe Shores, MD;  Location: [facility];  Service: Orthopedics;  Laterality: Right;  . COLONOSCOPY W/ BIOPSIES AND POLYPECTOMY    . ENUCLEATION Left   . EYE SURGERY     3 Corneal transplant  (bilateral eyes).    . FRACTURE SURGERY    . HARDWARE REMOVAL  10/09/2011   Procedure: HARDWARE REMOVAL;  Surgeon: Nadara Mustard, MD;  Location: [facility] OR;  Service: Orthopedics;  Laterality: Right;  Removal Deep Hardware Right Wrist, placement antibiotic beads.  . INGUINAL HERNIA REPAIR Left    age 28  . Litrotripsy    . metacarpal pinning Right    right hand  . NERVE, TENDON AND ARTERY REPAIR Right 12/05/2015   Procedure: RIGHT WRIST, HAND FLEXOR TENDON LENGTHENING;  Surgeon: Dairl Ponder, MD;  Location: [facility] OR;  Service: Orthopedics;  Laterality: Right;  . NEUROLYSIS OF MEDIAN NERVE Right 12/05/2015   Procedure: RIGHT MEDIAN NERVE NEUROLYSIS ;  Surgeon: Dairl Ponder, MD;  Location: [facility] OR;  Service: Orthopedics;  Laterality: Right;  . ORIF DISTAL RADIUS FRACTURE  09/12/11   right  . ORIF WRIST FRACTURE Right 06/15/2013   Procedure: RIGHT RADIUS PSTEOTOMY, DISTAL ULNA RESECTION, AND DIGITAL FLEXOR LENGTHENING AS NEEDED;  Surgeon: Marlowe Shores, MD;  Location: [facility];  Service: Orthopedics;  Laterality: Right;  . PATELLA FRACTURE SURGERY     right  . RADIOLOGY WITH ANESTHESIA N/A 03/09/2014   Procedure: MRI - Cervical and Lumbar without Contrast;  Surgeon: Medication Radiologist, MD;  Location: [facility] OR;  Service: Radiology;  Laterality: N/A;  . RADIOLOGY WITH ANESTHESIA N/A 04/03/2015   Procedure: MRI OF BRAIN    (RADIOLOGY WITH ANESTHESIA);  Surgeon: Medication Radiologist, MD;  Location: [facility] OR;  Service: Radiology;  Laterality: N/A;  . TONSILLECTOMY AND ADENOIDECTOMY         Home Medications    Prior to Admission medications   Medication Sig Start Date End Date Taking? Authorizing Provider  albuterol (PROVENTIL HFA;VENTOLIN HFA) 108 (90 BASE) MCG/ACT inhaler Inhale 2 puffs into the lungs every 6 (six) hours as needed for wheezing or shortness of breath (wheezing & shortness of breath). 06/11/14  Yes Azalia Bilis, MD  albuterol (PROVENTIL) (2.5 MG/3ML) 0.083%  nebulizer solution Take 3 mLs (2.5 mg total) by nebulization every 4 (four) hours as needed for wheezing or shortness of breath. 04/17/15  Yes Tammy S Parrett, NP  calcium-vitamin D (CALCIUM 500/D) 500-200 MG-UNIT tablet Take 1 tablet by mouth daily.   Yes Historical Provider, MD  clobetasol cream (TEMOVATE) 0.05 % APPLY 1 APPLICATION ON THE SKIN TWICE A DAY TO WORST AREAS FOR 7-10 DAYS 10/08/15  Yes Historical Provider, MD  diazepam (VALIUM) 10 MG tablet Take 1 tablet (10 mg total) by mouth 3 (three) times daily. 06/06/14  Yes Osvaldo Shipper, MD  DULERA 200-5 MCG/ACT AERO INHALE 2 PUFFS INTO THE LUNGS 2 TIMES DAILY 07/20/15  Yes Nyoka Cowden, MD  EPINEPHrine (EPIPEN 2-PAK) 0.3 mg/0.3 mL IJ SOAJ injection Inject 0.3 mg into the muscle once. For anaphylaxis  Yes Historical Provider, MD  Fluocinolone Acetonide (DERMA-SMOOTHE/FS BODY) 0.01 % OIL Apply 1 application topically daily. 05/31/15  Yes Historical Provider, MD  fluticasone (FLONASE) 50 MCG/ACT nasal spray USE 2 SPRAYS IN EACH NOSTRIL TWICE DAILY. 06/20/15  Yes Nyoka Cowden, MD  hydrOXYzine (ATARAX/VISTARIL) 25 MG tablet Take 1 tablet (25 mg total) by mouth 3 (three) times daily as needed for itching (itching). 06/11/14  Yes Azalia Bilis, MD  omeprazole (PRILOSEC) 40 MG capsule Take 40 mg by mouth daily as needed for heartburn.   Yes Historical Provider, MD  prednisoLONE acetate (PRED FORTE) 1 % ophthalmic suspension Place 1 drop into both eyes daily.    Yes Historical Provider, MD  sertraline (ZOLOFT) 100 MG tablet Take 2 tablets (200 mg total) by mouth daily. 06/11/14  Yes Azalia Bilis, MD  tamsulosin (FLOMAX) 0.4 MG CAPS capsule Take 0.4 mg by mouth daily. 10/27/15  Yes Historical Provider, MD  temazepam (RESTORIL) 15 MG capsule Take 15 mg by mouth at bedtime as needed for sleep.   Yes Historical Provider, MD  traMADol (ULTRAM) 50 MG tablet Take 50 mg by mouth every 6 (six) hours as needed for pain. 01/01/16  Yes Historical Provider, MD    oxyCODONE-acetaminophen (ROXICET) 5-325 MG tablet Take 1 tablet by mouth every 4 (four) hours as needed for severe pain. 12/05/15   Dairl Ponder, MD  polyethylene glycol powder (MIRALAX) powder Take 3 caps TID for 2-3 days.  Take 1 cap TID for 2-3 days.  Then take 1 cap QD. 01/07/16   Cheri Fowler, PA-C    Family History Family History  Problem Relation Age of Onset  . Allergies Mother     atopic  . Asthma Mother   . Heart failure Mother     CHF  . Other Father   . Anesthesia problems Neg Hx     Social History Social History  Substance Use Topics  . Smoking status: Former Smoker    Packs/day: 1.00    Years: 3.00    Types: Cigarettes, Pipe    Quit date: 05/20/1983  . Smokeless tobacco: Never Used  . Alcohol use Yes     Comment: "scotch once a year"     Allergies   Bacitracin-polymyxin b; Bee venom; Norco [hydrocodone-acetaminophen]; Penicillins; Shellfish-derived products; Codeine; Other; Tape; Sulfate; and Chlorhexidine   Review of Systems Review of Systems  Constitutional: Negative for fever.  Gastrointestinal: Positive for abdominal pain, constipation and nausea. Negative for anal bleeding, blood in stool and vomiting.  Genitourinary: Positive for decreased urine volume and difficulty urinating. Negative for dysuria and hematuria.     Physical Exam Updated Vital Signs BP 147/96 (BP Location: Left Arm)   Pulse 88   Temp 97.6 F (36.4 C) (Oral)   Resp 20   Ht 5\' 9"  (1.753 m)   Wt 68 kg   SpO2 96%   BMI 22.15 kg/m   Physical Exam  Constitutional: He is oriented to person, place, and time. He appears well-developed and well-nourished.  Non-toxic appearance. He does not have a sickly appearance. He does not appear ill.  HENT:  Head: Normocephalic and atraumatic.  Mouth/Throat: Oropharynx is clear and moist.  Neck: Normal range of motion. Neck supple.  Cardiovascular: Normal rate and regular rhythm.   Pulmonary/Chest: Effort normal and breath sounds normal.  No accessory muscle usage or stridor. No respiratory distress. He has no wheezes. He has no rhonchi. He has no rales.  Abdominal: Soft. Bowel sounds are normal. He exhibits no  distension. There is tenderness in the suprapubic area.  Genitourinary:  Genitourinary Comments: Examination chaperoned by ED scribe. Impaction noted.  Moderate, palpable, malleable brown stool in rectal vault.   Musculoskeletal: Normal range of motion.  Lymphadenopathy:    He has no cervical adenopathy.  Neurological: He is alert and oriented to person, place, and time.  Speech clear without dysarthria.  Skin: Skin is warm and dry.  Healing old ecchymoses on the right hip  Psychiatric: He has a normal mood and affect. His behavior is normal.  Nursing note and vitals reviewed.    ED Treatments / Results  DIAGNOSTIC STUDIES: Oxygen Saturation is 100% on RA, normal by my interpretation.    COORDINATION OF CARE: 8:24 PM-Discussed treatment plan which includes labs and enema with pt at bedside and pt agreed to plan.    Labs (all labs ordered are listed, but only abnormal results are displayed) Labs Reviewed  BASIC METABOLIC PANEL - Abnormal; Notable for the following:       Result Value   Glucose, Bld 113 (*)    All other components within normal limits  CBC WITH DIFFERENTIAL/PLATELET - Abnormal; Notable for the following:    Neutro Abs 8.0 (*)    All other components within normal limits  URINALYSIS, ROUTINE W REFLEX MICROSCOPIC (NOT AT [facility])    EKG  EKG Interpretation None       Radiology No results found.  Procedures Procedures (including critical care time)    Medications Ordered in ED Medications  mineral oil enema 1 enema (1 enema Rectal Given 01/06/16 2112)  sodium phosphate (FLEET) 7-19 GM/118ML enema 1 enema (1 enema Rectal Given 01/06/16 2256)  sodium phosphate (FLEET) 7-19 GM/118ML enema 1 enema (1 enema Rectal Given 01/06/16 2358)  magnesium citrate solution 1 Bottle (1 Bottle Oral  Given 01/07/16 0033)  sodium chloride 0.9 % bolus 1,000 mL (1,000 mLs Intravenous New Bag/Given 01/07/16 0127)     Initial Impression / Assessment and Plan / ED Course  I have reviewed the triage vital signs and the nursing notes.  Pertinent labs & imaging results that were available during my care of the patient were reviewed by me and considered in my medical decision making (see chart for details).  Clinical Course   Patient presents with urinary retention and constipation since this AM.  Mild abdominal pain.  No bloody stools.  No BRBPR.  No back/flank pain.  No fever or emesis.  Initially hypertensive, but this resolved after catheter insertion.  Patient appears well, non-toxic or ill.  Mild suprapubic tenderness without rebound, guarding, or rigidity.  He is impacted with moderate stool in the rectal vault.  Prostate not palpated. Manual disimpaction performed with some success.  He reports improvement of abdominal pain/urinary retention with catheter insertion.  Patient will be given enema.  Will obtain UA, BMP, and CBC to evaluate for infection and renal function.  Labs normal.  Repeat disimpaction performed and repeat enema performed.  Patient able to have BM.  Home with Miralax. I suspect symptoms related to impaction and prostate enlargement. Anticipate discharge home with close urology follow up. Return precautions discussed.  Case has been discussed with and seen by Dr. Eudelia Bunch who agrees with the above plan for discharge.   Final Clinical Impressions(s) / ED Diagnoses   Final diagnoses:  Fecal impaction (HCC)  Urinary retention    New Prescriptions New Prescriptions   POLYETHYLENE GLYCOL POWDER (MIRALAX) POWDER    Take 3 caps TID for 2-3 days.  Take 1 cap TID for 2-3 days.  Then take 1 cap QD.   I personally performed the services described in this documentation, which was scribed in my presence. The recorded information has been reviewed and is accurate.    Cheri FowlerKayla Corliss Coggeshall,  PA-C 01/07/16 [id number]    April Palumbo, MD 01/07/16 [id number]

## 2016-01-07 LAB — CBC WITH DIFFERENTIAL/PLATELET
BASOS PCT: 0 %
Basophils Absolute: 0 10*3/uL (ref 0.0–0.1)
EOS ABS: 0.4 10*3/uL (ref 0.0–0.7)
EOS PCT: 4 %
HCT: 43.2 % (ref 39.0–52.0)
Hemoglobin: 15.4 g/dL (ref 13.0–17.0)
LYMPHS ABS: 1.2 10*3/uL (ref 0.7–4.0)
LYMPHS PCT: 11 %
MCH: 32.2 pg (ref 26.0–34.0)
MCHC: 35.6 g/dL (ref 30.0–36.0)
MCV: 90.2 fL (ref 78.0–100.0)
Monocytes Absolute: 0.7 10*3/uL (ref 0.1–1.0)
Monocytes Relative: 7 %
NEUTROS ABS: 8 10*3/uL — AB (ref 1.7–7.7)
NEUTROS PCT: 78 %
PLATELETS: 301 10*3/uL (ref 150–400)
RBC: 4.79 MIL/uL (ref 4.22–5.81)
RDW: 13.3 % (ref 11.5–15.5)
WBC: 10.3 10*3/uL (ref 4.0–10.5)

## 2016-01-07 MED ORDER — MAGNESIUM CITRATE PO SOLN
1.0000 | Freq: Once | ORAL | Status: AC
  Administered 2016-01-07: 1 via ORAL
  Filled 2016-01-07: qty 296

## 2016-01-07 MED ORDER — POLYETHYLENE GLYCOL 3350 17 GM/SCOOP PO POWD: ORAL | 0 refills | Status: DC

## 2016-01-07 MED ORDER — ONDANSETRON 4 MG PO TBDP
4.0000 mg | ORAL_TABLET | Freq: Once | ORAL | Status: AC
  Administered 2016-01-07: 4 mg via ORAL
  Filled 2016-01-07: qty 1

## 2016-01-07 MED ORDER — ONDANSETRON HCL 4 MG PO TABS: 4.0000 mg | ORAL_TABLET | Freq: Four times a day (QID) | ORAL | 0 refills | Status: DC

## 2016-01-07 MED ORDER — SODIUM CHLORIDE 0.9 % IV BOLUS (SEPSIS)
1000.0000 mL | Freq: Once | INTRAVENOUS | Status: AC
  Administered 2016-01-07: 1000 mL via INTRAVENOUS

## 2016-01-07 NOTE — ED Notes (Signed)
Called [facility] for transport back home. No respiratory or acute distress noted alert and oriented x 3 no reaction noted medication noted.

## 2016-01-07 NOTE — ED Notes (Signed)
Cleaned up pt due to small bowel movement no skin breakdown noted alert and oriented x 3 c/o nausea voiced call light in reach.

## 2016-01-07 NOTE — ED Provider Notes (Signed)
Medical screening examination/treatment/procedure(s) were conducted as a shared visit with non-physician practitioner(s) and myself.  I personally evaluated the patient during the encounter. Briefly, the patient is a 65 y.o. male with a history of COPD, hepatitis C, hypertension, large prostate presents the ED with intermittent abdominal pain as well as decreased urine output. Patient noted to have rectal fecal impaction. Manual disimpaction was attempted in the ED with minimal stool retrieved. Rectal enema attempted without success. I personally performed manual disimpaction and was able to obtain some stool. However patient still with significant amount of fecal impaction. Foley catheter inserted and Fleet enema administered. Patient also given magnesium. Patient was able to have moderate size bowel movement following treatment. Additionally the patient also noted to have urinary obstruction. Indwelling Foley catheter placed. Patient will remain with a catheter and follow up with urology. Labs obtained and were reassuring. Patient is safe for discharge with strict return precautions and close follow-up.    Nira ConnPedro Eduardo Kaleesi Guyton, MD 01/09/16 [phone]

## 2016-01-07 NOTE — ED Notes (Signed)
No respiratory or acute distress noted alert and oriented x 3 call light in reach no reaction to medication noted. 

## 2016-01-08 ENCOUNTER — Emergency Department (HOSPITAL_COMMUNITY): Payer: Medicaid Other

## 2016-01-08 ENCOUNTER — Encounter (HOSPITAL_COMMUNITY): Payer: Self-pay | Admitting: *Deleted

## 2016-01-08 ENCOUNTER — Inpatient Hospital Stay (HOSPITAL_COMMUNITY)
Admission: EM | Admit: 2016-01-08 | Discharge: 2016-01-11 | DRG: 552 | Disposition: A | Discharge status: Home Health | Payer: Medicaid Other | Attending: Internal Medicine | Admitting: Internal Medicine

## 2016-01-08 DIAGNOSIS — L299 Pruritus, unspecified: Secondary | ICD-10-CM | POA: Diagnosis present

## 2016-01-08 DIAGNOSIS — T83511A Infection and inflammatory reaction due to indwelling urethral catheter, initial encounter: Secondary | ICD-10-CM

## 2016-01-08 DIAGNOSIS — S32030A Wedge compression fracture of third lumbar vertebra, initial encounter for closed fracture: Secondary | ICD-10-CM | POA: Diagnosis not present

## 2016-01-08 DIAGNOSIS — N200 Calculus of kidney: Secondary | ICD-10-CM

## 2016-01-08 DIAGNOSIS — I10 Essential (primary) hypertension: Secondary | ICD-10-CM | POA: Diagnosis present

## 2016-01-08 DIAGNOSIS — N179 Acute kidney failure, unspecified: Secondary | ICD-10-CM | POA: Diagnosis present

## 2016-01-08 DIAGNOSIS — K5900 Constipation, unspecified: Secondary | ICD-10-CM | POA: Diagnosis present

## 2016-01-08 DIAGNOSIS — S32039A Unspecified fracture of third lumbar vertebra, initial encounter for closed fracture: Principal | ICD-10-CM | POA: Diagnosis present

## 2016-01-08 DIAGNOSIS — R52 Pain, unspecified: Secondary | ICD-10-CM

## 2016-01-08 DIAGNOSIS — F419 Anxiety disorder, unspecified: Secondary | ICD-10-CM | POA: Diagnosis present

## 2016-01-08 DIAGNOSIS — R339 Retention of urine, unspecified: Secondary | ICD-10-CM | POA: Diagnosis not present

## 2016-01-08 DIAGNOSIS — Z87442 Personal history of urinary calculi: Secondary | ICD-10-CM

## 2016-01-08 DIAGNOSIS — H548 Legal blindness, as defined in USA: Secondary | ICD-10-CM | POA: Diagnosis present

## 2016-01-08 DIAGNOSIS — M8580 Other specified disorders of bone density and structure, unspecified site: Secondary | ICD-10-CM | POA: Diagnosis present

## 2016-01-08 DIAGNOSIS — Z825 Family history of asthma and other chronic lower respiratory diseases: Secondary | ICD-10-CM

## 2016-01-08 DIAGNOSIS — J329 Chronic sinusitis, unspecified: Secondary | ICD-10-CM | POA: Diagnosis present

## 2016-01-08 DIAGNOSIS — T83518A Infection and inflammatory reaction due to other urinary catheter, initial encounter: Secondary | ICD-10-CM | POA: Diagnosis present

## 2016-01-08 DIAGNOSIS — S32000A Wedge compression fracture of unspecified lumbar vertebra, initial encounter for closed fracture: Secondary | ICD-10-CM

## 2016-01-08 DIAGNOSIS — Z883 Allergy status to other anti-infective agents status: Secondary | ICD-10-CM

## 2016-01-08 DIAGNOSIS — Z882 Allergy status to sulfonamides status: Secondary | ICD-10-CM

## 2016-01-08 DIAGNOSIS — Z79899 Other long term (current) drug therapy: Secondary | ICD-10-CM

## 2016-01-08 DIAGNOSIS — J449 Chronic obstructive pulmonary disease, unspecified: Secondary | ICD-10-CM | POA: Diagnosis present

## 2016-01-08 DIAGNOSIS — W1830XA Fall on same level, unspecified, initial encounter: Secondary | ICD-10-CM | POA: Diagnosis present

## 2016-01-08 DIAGNOSIS — Y846 Urinary catheterization as the cause of abnormal reaction of the patient, or of later complication, without mention of misadventure at the time of the procedure: Secondary | ICD-10-CM | POA: Diagnosis present

## 2016-01-08 DIAGNOSIS — N401 Enlarged prostate with lower urinary tract symptoms: Secondary | ICD-10-CM | POA: Diagnosis present

## 2016-01-08 DIAGNOSIS — Z91030 Bee allergy status: Secondary | ICD-10-CM

## 2016-01-08 DIAGNOSIS — K5190 Ulcerative colitis, unspecified, without complications: Secondary | ICD-10-CM | POA: Diagnosis present

## 2016-01-08 DIAGNOSIS — G8929 Other chronic pain: Secondary | ICD-10-CM | POA: Diagnosis present

## 2016-01-08 DIAGNOSIS — M545 Low back pain, unspecified: Secondary | ICD-10-CM

## 2016-01-08 DIAGNOSIS — Z880 Allergy status to penicillin: Secondary | ICD-10-CM

## 2016-01-08 DIAGNOSIS — Z7951 Long term (current) use of inhaled steroids: Secondary | ICD-10-CM

## 2016-01-08 DIAGNOSIS — K219 Gastro-esophageal reflux disease without esophagitis: Secondary | ICD-10-CM | POA: Diagnosis present

## 2016-01-08 DIAGNOSIS — Z9109 Other allergy status, other than to drugs and biological substances: Secondary | ICD-10-CM

## 2016-01-08 DIAGNOSIS — F329 Major depressive disorder, single episode, unspecified: Secondary | ICD-10-CM | POA: Diagnosis present

## 2016-01-08 DIAGNOSIS — B182 Chronic viral hepatitis C: Secondary | ICD-10-CM | POA: Diagnosis present

## 2016-01-08 DIAGNOSIS — Z87891 Personal history of nicotine dependence: Secondary | ICD-10-CM

## 2016-01-08 DIAGNOSIS — Z8249 Family history of ischemic heart disease and other diseases of the circulatory system: Secondary | ICD-10-CM

## 2016-01-08 DIAGNOSIS — Z885 Allergy status to narcotic agent status: Secondary | ICD-10-CM

## 2016-01-08 DIAGNOSIS — R338 Other retention of urine: Secondary | ICD-10-CM | POA: Diagnosis present

## 2016-01-08 DIAGNOSIS — Z91013 Allergy to seafood: Secondary | ICD-10-CM

## 2016-01-08 DIAGNOSIS — N390 Urinary tract infection, site not specified: Secondary | ICD-10-CM | POA: Diagnosis present

## 2016-01-08 LAB — COMPREHENSIVE METABOLIC PANEL
ALBUMIN: 4.8 g/dL (ref 3.5–5.0)
ALT: 32 U/L (ref 17–63)
ANION GAP: 12 (ref 5–15)
AST: 29 U/L (ref 15–41)
Alkaline Phosphatase: 119 U/L (ref 38–126)
BILIRUBIN TOTAL: 1.2 mg/dL (ref 0.3–1.2)
BUN: 26 mg/dL — ABNORMAL HIGH (ref 6–20)
CO2: 24 mmol/L (ref 22–32)
Calcium: 9.9 mg/dL (ref 8.9–10.3)
Chloride: 103 mmol/L (ref 101–111)
Creatinine, Ser: 2.13 mg/dL — ABNORMAL HIGH (ref 0.61–1.24)
GFR calc non Af Amer: 31 mL/min — ABNORMAL LOW (ref >60)
GFR, EST AFRICAN AMERICAN: 36 mL/min — AB (ref >60)
GLUCOSE: 120 mg/dL — AB (ref 65–99)
POTASSIUM: 3.2 mmol/L — AB (ref 3.5–5.1)
SODIUM: 139 mmol/L (ref 135–145)
TOTAL PROTEIN: 8.5 g/dL — AB (ref 6.5–8.1)

## 2016-01-08 LAB — CBC WITH DIFFERENTIAL/PLATELET
BASOS ABS: 0 10*3/uL (ref 0.0–0.1)
BASOS PCT: 0 %
EOS ABS: 0.2 10*3/uL (ref 0.0–0.7)
Eosinophils Relative: 2 %
HCT: 46.5 % (ref 39.0–52.0)
HEMOGLOBIN: 16 g/dL (ref 13.0–17.0)
Lymphocytes Relative: 10 %
Lymphs Abs: 1.3 10*3/uL (ref 0.7–4.0)
MCH: 31.6 pg (ref 26.0–34.0)
MCHC: 34.4 g/dL (ref 30.0–36.0)
MCV: 91.7 fL (ref 78.0–100.0)
MONOS PCT: 9 %
Monocytes Absolute: 1.1 10*3/uL — ABNORMAL HIGH (ref 0.1–1.0)
NEUTROS ABS: 9.7 10*3/uL — AB (ref 1.7–7.7)
NEUTROS PCT: 79 %
Platelets: 418 10*3/uL — ABNORMAL HIGH (ref 150–400)
RBC: 5.07 MIL/uL (ref 4.22–5.81)
RDW: 13.5 % (ref 11.5–15.5)
WBC: 12.3 10*3/uL — ABNORMAL HIGH (ref 4.0–10.5)

## 2016-01-08 LAB — URINALYSIS, ROUTINE W REFLEX MICROSCOPIC
Glucose, UA: NEGATIVE mg/dL
KETONES UR: NEGATIVE mg/dL
NITRITE: POSITIVE — AB
Protein, ur: 100 mg/dL — AB
SPECIFIC GRAVITY, URINE: 1.027 (ref 1.005–1.030)
pH: 5 (ref 5.0–8.0)

## 2016-01-08 LAB — I-STAT CG4 LACTIC ACID, ED
LACTIC ACID, VENOUS: 2.92 mmol/L — AB (ref 0.5–1.9)
LACTIC ACID, VENOUS: 0.99 mmol/L (ref 0.5–1.9)

## 2016-01-08 LAB — URINE MICROSCOPIC-ADD ON

## 2016-01-08 LAB — I-STAT CREATININE, ED: Creatinine, Ser: 2 mg/dL — ABNORMAL HIGH (ref 0.61–1.24)

## 2016-01-08 LAB — CK: CK TOTAL: 106 U/L (ref 49–397)

## 2016-01-08 MED ORDER — HEPARIN SODIUM (PORCINE) 5000 UNIT/ML IJ SOLN
5000.0000 [IU] | Freq: Three times a day (TID) | INTRAMUSCULAR | Status: DC
  Administered 2016-01-09 – 2016-01-11 (×6): 5000 [IU] via SUBCUTANEOUS
  Filled 2016-01-08 (×8): qty 1

## 2016-01-08 MED ORDER — FLUTICASONE PROPIONATE 50 MCG/ACT NA SUSP
2.0000 | Freq: Two times a day (BID) | NASAL | Status: DC
  Administered 2016-01-09 – 2016-01-11 (×6): 2 via NASAL
  Filled 2016-01-08 (×2): qty 16

## 2016-01-08 MED ORDER — TAMSULOSIN HCL 0.4 MG PO CAPS
0.4000 mg | ORAL_CAPSULE | Freq: Every day | ORAL | Status: DC
  Administered 2016-01-09 – 2016-01-11 (×3): 0.4 mg via ORAL
  Filled 2016-01-08 (×3): qty 1

## 2016-01-08 MED ORDER — SODIUM CHLORIDE 0.9 % IV BOLUS (SEPSIS)
1000.0000 mL | Freq: Once | INTRAVENOUS | Status: AC
  Administered 2016-01-08: 1000 mL via INTRAVENOUS

## 2016-01-08 MED ORDER — MOMETASONE FURO-FORMOTEROL FUM 200-5 MCG/ACT IN AERO
2.0000 | INHALATION_SPRAY | Freq: Two times a day (BID) | RESPIRATORY_TRACT | Status: DC
  Administered 2016-01-09 – 2016-01-11 (×5): 2 via RESPIRATORY_TRACT
  Filled 2016-01-08 (×2): qty 8.8

## 2016-01-08 MED ORDER — DEXTROSE 5 % IV SOLN
1.0000 g | Freq: Every day | INTRAVENOUS | Status: DC
  Administered 2016-01-09 (×2): 1 g via INTRAVENOUS
  Filled 2016-01-08 (×3): qty 10

## 2016-01-08 MED ORDER — SODIUM CHLORIDE 0.9 % IV SOLN
INTRAVENOUS | Status: DC
  Administered 2016-01-09: via INTRAVENOUS
  Administered 2016-01-09: 1000 mL via INTRAVENOUS
  Administered 2016-01-10: 15:00:00 via INTRAVENOUS
  Administered 2016-01-10: 1000 mL via INTRAVENOUS

## 2016-01-08 MED ORDER — DERMA-SMOOTHE/FS BODY 0.01 % EX OIL: 1.0000 "application " | TOPICAL_OIL | Freq: Every day | CUTANEOUS | Status: DC

## 2016-01-08 MED ORDER — PREDNISOLONE ACETATE 1 % OP SUSP
1.0000 [drp] | Freq: Every day | OPHTHALMIC | Status: DC
  Administered 2016-01-09 – 2016-01-11 (×4): 1 [drp] via OPHTHALMIC
  Filled 2016-01-08: qty 1

## 2016-01-08 MED ORDER — OXYCODONE-ACETAMINOPHEN 5-325 MG PO TABS
1.0000 | ORAL_TABLET | Freq: Four times a day (QID) | ORAL | Status: DC | PRN
  Administered 2016-01-09 (×2): 1 via ORAL
  Filled 2016-01-08 (×2): qty 1

## 2016-01-08 MED ORDER — DIAZEPAM 5 MG PO TABS
10.0000 mg | ORAL_TABLET | Freq: Three times a day (TID) | ORAL | Status: DC
  Administered 2016-01-09 – 2016-01-11 (×8): 10 mg via ORAL
  Filled 2016-01-08 (×8): qty 2

## 2016-01-08 MED ORDER — TEMAZEPAM 15 MG PO CAPS
15.0000 mg | ORAL_CAPSULE | Freq: Every evening | ORAL | Status: DC | PRN
  Administered 2016-01-11: 15 mg via ORAL
  Filled 2016-01-08: qty 1

## 2016-01-08 MED ORDER — TRAMADOL HCL 50 MG PO TABS
100.0000 mg | ORAL_TABLET | Freq: Once | ORAL | Status: AC
  Administered 2016-01-08: 100 mg via ORAL
  Filled 2016-01-08: qty 2

## 2016-01-08 MED ORDER — ALBUTEROL SULFATE HFA 108 (90 BASE) MCG/ACT IN AERS: 2.0000 | INHALATION_SPRAY | Freq: Four times a day (QID) | RESPIRATORY_TRACT | Status: DC | PRN

## 2016-01-08 MED ORDER — METHOCARBAMOL 500 MG PO TABS
1000.0000 mg | ORAL_TABLET | Freq: Once | ORAL | Status: AC
  Administered 2016-01-08: 1000 mg via ORAL
  Filled 2016-01-08: qty 2

## 2016-01-08 MED ORDER — AZTREONAM 1 G IJ SOLR
1.0000 g | Freq: Once | INTRAMUSCULAR | Status: DC
  Administered 2016-01-08: 1 g via INTRAVENOUS
  Filled 2016-01-08: qty 1

## 2016-01-08 MED ORDER — OXYCODONE-ACETAMINOPHEN 5-325 MG PO TABS
1.0000 | ORAL_TABLET | Freq: Once | ORAL | Status: AC
  Administered 2016-01-08: 1 via ORAL
  Filled 2016-01-08: qty 1

## 2016-01-08 MED ORDER — PANTOPRAZOLE SODIUM 40 MG PO TBEC
80.0000 mg | DELAYED_RELEASE_TABLET | Freq: Every day | ORAL | Status: DC
Start: 2016-01-08 — End: 2016-01-11
  Administered 2016-01-09 – 2016-01-11 (×4): 80 mg via ORAL
  Filled 2016-01-08 (×4): qty 2

## 2016-01-08 MED ORDER — CALCIUM CARBONATE-VITAMIN D 500-200 MG-UNIT PO TABS
1.0000 | ORAL_TABLET | Freq: Every day | ORAL | Status: DC
  Administered 2016-01-09 – 2016-01-11 (×4): 1 via ORAL
  Filled 2016-01-08 (×4): qty 1

## 2016-01-08 MED ORDER — ALBUTEROL SULFATE (2.5 MG/3ML) 0.083% IN NEBU: 2.5000 mg | INHALATION_SOLUTION | RESPIRATORY_TRACT | Status: DC | PRN

## 2016-01-08 MED ORDER — SERTRALINE HCL 50 MG PO TABS
200.0000 mg | ORAL_TABLET | Freq: Every day | ORAL | Status: DC
  Administered 2016-01-09 – 2016-01-11 (×4): 200 mg via ORAL
  Filled 2016-01-08 (×4): qty 4

## 2016-01-08 MED ORDER — HYDROXYZINE HCL 25 MG PO TABS: 25.0000 mg | ORAL_TABLET | Freq: Three times a day (TID) | ORAL | Status: DC | PRN

## 2016-01-08 NOTE — ED Notes (Signed)
Notified PA of urine delay. Cath clamped with no urine return.  Now, bag changed to leg bag and patient repositioned. Will check back for urine sample

## 2016-01-08 NOTE — ED Notes (Signed)
Attempted to pull urine sample from catheter port... No urine return when attempted.

## 2016-01-08 NOTE — ED Notes (Signed)
IV attempt x 2.  Left forearm.

## 2016-01-08 NOTE — ED Notes (Signed)
Report given to RN

## 2016-01-08 NOTE — ED Notes (Signed)
Attempted report x1, RN in room giving patient care. 

## 2016-01-08 NOTE — ED Provider Notes (Signed)
Medical screening examination/treatment/procedure(s) were conducted as a shared visit with non-physician practitioner(s) and myself.  I personally evaluated the patient during the encounter.   EKG Interpretation None     Patient here complaining of increasing back pain 2 weeks. Patient symptoms started after he fell. Does have a compression fracture on his plain films of the spine. Has evidence of increased creatinine likely from dehydration. He denies volume loss but notes that he has had decreased oral intake Patient will receive IV hydration here and will repeat his renal function and if improving he'll be discharged home   Lorre NickAnthony Carrington Mullenax, MD 01/08/16 [id number]

## 2016-01-08 NOTE — ED Triage Notes (Signed)
Pt complains of worsening lower back pain since this morning, which started 2 weeks ago. Pt was seen 2 days ago and had foley catheter placed. Pt states he has had normal output from urinary catheter. Pt denies n/v//d or abdominal pain.

## 2016-01-08 NOTE — ED Provider Notes (Signed)
[facility]-EMERGENCY DEPT Provider Note   CSN: [id number] Arrival date & time: 01/08/16  1409     History   Chief Complaint Chief Complaint  Patient presents with  . Back Pain    HPI CHRISTIA COAXUM is a 65 y.o. male with multiple medical problems presents with low back pain and right hip pain. Somewhat of a poor historian. He reports a fall two weeks ago and has been having ongoing pain. He has been seen twice over the past 2 days for abdominal pain, constipation, and urinary retention. He had an enema with good relief and had a foley placed and told to follow up with urology. Today he is just complaining of back pain. Worse with movement. No fever, syncope, trauma, unexplained weight loss, hx of cancer, loss of bowel function, saddle anesthesia, IVDU. He has urinary retention requiring foley placement but this has been ongoing since Dec 2016.  Reports numbness in bilateral legs when he walks and difficulty walking. 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   HPI  Past Medical History:  Diagnosis Date  . Allergic rhinitis   . Anxiety   . Arthritis    spine  . Chronic lower back pain   . Contact dermatitis and other eczema, due to unspecified cause   . COPD (chronic obstructive pulmonary disease) (HCC)   . Cough   . Depression    patient constantly hitting left side of face "due to pain"  . Enlarged prostate   . Esophageal reflux   . H/O hiatal hernia   . Headache    PMH: migraines  . Hep C w/o coma, chronic (HCC) early 2013  . History of bronchitis   . History of eye prosthesis   . History of kidney stones   . Hypertension    not on medication at this time  . 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 Multiple facial bone fractures (HCC)   . Multiple open wounds    "from esczema- scratching"  . Osteopenia   . Pancreatitis   . Personal history of other diseases of digestive  system   . Pneumonia    in past had several times  . Seasonal allergies   . Shortness of breath    "sometime"  . Ulcerative colitis   . Unspecified asthma(493.90)   . Unspecified sinusitis (chronic)     Patient Active Problem List   Diagnosis Date Noted  . Pruritus 10/01/2015  . Acute maxillary sinusitis 06/03/2014  . Severe asthma   . Acute on chronic respiratory failure (HCC) 05/31/2014  . Chronic cough 05/31/2014  . Asthma with acute exacerbation 05/31/2014  . Chronic pain syndrome 05/31/2014  . Malnutrition of moderate degree (HCC) 05/30/2014  . Hypokalemia 05/28/2014  . SOB (shortness of breath) 05/28/2014  . Weakness of back 02/03/2014  . Weakness of both legs 02/03/2014  . Gait instability 12/01/2013  . Allergic contact dermatitis 12/24/2011  . 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 Wrist joint infection (HCC) 10/10/2011    Class: Diagnosis of  . Urinary retention 09/16/2011  . 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 TACHYCARDIA 07/16/2010  . BLINDNESS 10/27/2008  . Sinusitis, chronic 08/03/2007  . Allergic rhinitis 08/03/2007  . Asthma 08/03/2007  . Esophageal reflux 08/03/2007  . ECZEMA 08/03/2007  . OSTEOPENIA 08/03/2007  . ULCERATIVE COLITIS, HX OF 08/03/2007    Past Surgical History:  Procedure Laterality Date  .  CARPAL TUNNEL RELEASE Right 06/15/2013   Procedure: RIGHT CARPAL TUNNEL RELEASE;  Surgeon: Marlowe Shores, MD;  Location: [facility];  Service: Orthopedics;  Laterality: Right;  . COLONOSCOPY W/ BIOPSIES AND POLYPECTOMY    . ENUCLEATION Left   . EYE SURGERY     3 Corneal transplant (bilateral eyes).    . FRACTURE SURGERY    . HARDWARE REMOVAL  10/09/2011   Procedure: HARDWARE REMOVAL;  Surgeon: Nadara Mustard, MD;  Location[facility];  Service: Orthopedics;  Laterality: Right;  Removal Deep Hardware Right Wrist, placement antibiotic beads.  . INGUINAL HERNIA REPAIR Left    age 42  . Litrotripsy    . metacarpal pinning Right     right hand  . NERVE, TENDON AND ARTERY REPAIR Right 12/05/2015   Procedure: RIGHT WRIST, HAND FLEXOR TENDON LENGTHENING;  Surgeon: Dairl Ponder, MD;  Location: [facility] OR;  Service: Orthopedics;  Laterality: Right;  . NEUROLYSIS OF MEDIAN NERVE Right 12/05/2015   Procedure: RIGHT MEDIAN NERVE NEUROLYSIS ;  Surgeon: Dairl Ponder, MD;  Location[facility] OR;  Service: Orthopedics;  Laterality: Right;  . ORIF DISTAL RADIUS FRACTURE  09/12/11   right  . ORIF WRIST FRACTURE Right 06/15/2013   Procedure: RIGHT RADIUS PSTEOTOMY, DISTAL ULNA RESECTION, AND DIGITAL FLEXOR LENGTHENING AS NEEDED;  Surgeon: Marlowe Shores, MD;  Location: [facility];  Service: Orthopedics;  Laterality: Right;  . PATELLA FRACTURE SURGERY     right  . RADIOLOGY WITH ANESTHESIA N/A 03/09/2014   Procedure: MRI - Cervical and Lumbar without Contrast;  Surgeon: Medication Radiologist, MD;  Location: [facility] OR;  Service: Radiology;  Laterality: N/A;  . RADIOLOGY WITH ANESTHESIA N/A 04/03/2015   Procedure: MRI OF BRAIN    (RADIOLOGY WITH ANESTHESIA);  Surgeon: Medication Radiologist, MD;  Location: [facility] OR;  Service: Radiology;  Laterality: N/A;  . TONSILLECTOMY AND ADENOIDECTOMY         Home Medications    Prior to Admission medications   Medication Sig Start Date End Date Taking? Authorizing Provider  albuterol (PROVENTIL HFA;VENTOLIN HFA) 108 (90 BASE) MCG/ACT inhaler Inhale 2 puffs into the lungs every 6 (six) hours as needed for wheezing or shortness of breath (wheezing & shortness of breath). 06/11/14   Azalia Bilis, MD  albuterol (PROVENTIL) (2.5 MG/3ML) 0.083% nebulizer solution Take 3 mLs (2.5 mg total) by nebulization every 4 (four) hours as needed for wheezing or shortness of breath. 04/17/15   Tammy S Parrett, NP  calcium-vitamin D (CALCIUM 500/D) 500-200 MG-UNIT tablet Take 1 tablet by mouth daily.    Historical Provider, MD  clobetasol cream (TEMOVATE) 0.05 % APPLY 1 APPLICATION ON THE SKIN TWICE A DAY TO  WORST AREAS FOR 7-10 DAYS 10/08/15   Historical Provider, MD  diazepam (VALIUM) 10 MG tablet Take 1 tablet (10 mg total) by mouth 3 (three) times daily. 06/06/14   Osvaldo Shipper, MD  DULERA 200-5 MCG/ACT AERO INHALE 2 PUFFS INTO THE LUNGS 2 TIMES DAILY 07/20/15   Nyoka Cowden, MD  EPINEPHrine (EPIPEN 2-PAK) 0.3 mg/0.3 mL IJ SOAJ injection Inject 0.3 mg into the muscle once. For anaphylaxis    Historical Provider, MD  Fluocinolone Acetonide (DERMA-SMOOTHE/FS BODY) 0.01 % OIL Apply 1 application topically daily. 05/31/15   Historical Provider, MD  fluticasone (FLONASE) 50 MCG/ACT nasal spray USE 2 SPRAYS IN EACH NOSTRIL TWICE DAILY. 06/20/15   Nyoka Cowden, MD  hydrOXYzine (ATARAX/VISTARIL) 25 MG tablet Take 1 tablet (25 mg total) by mouth 3 (  three) times daily as needed for itching (itching). 06/11/14   Azalia BilisKevin Campos, MD  omeprazole (PRILOSEC) 40 MG capsule Take 40 mg by mouth daily as needed for heartburn.    Historical Provider, MD  ondansetron (ZOFRAN) 4 MG tablet Take 1 tablet (4 mg total) by mouth every 6 (six) hours. 01/07/16   Cheri FowlerKayla Rose, PA-C  oxyCODONE-acetaminophen (ROXICET) 5-325 MG tablet Take 1 tablet by mouth every 4 (four) hours as needed for severe pain. 12/05/15   Dairl PonderMatthew Weingold, MD  polyethylene glycol powder (MIRALAX) powder Take 3 caps TID for 2-3 days.  Take 1 cap TID for 2-3 days.  Then take 1 cap QD. 01/07/16   Cheri FowlerKayla Rose, PA-C  prednisoLONE acetate (PRED FORTE) 1 % ophthalmic suspension Place 1 drop into both eyes daily.     Historical Provider, MD  sertraline (ZOLOFT) 100 MG tablet Take 2 tablets (200 mg total) by mouth daily. 06/11/14   Azalia BilisKevin Campos, MD  tamsulosin (FLOMAX) 0.4 MG CAPS capsule Take 0.4 mg by mouth daily. 10/27/15   Historical Provider, MD  temazepam (RESTORIL) 15 MG capsule Take 15 mg by mouth at bedtime as needed for sleep.    Historical Provider, MD  traMADol (ULTRAM) 50 MG tablet Take 50 mg by mouth every 6 (six) hours as needed for pain. 01/01/16   Historical  Provider, MD    Family History Family History  Problem Relation Age of Onset  . Allergies Mother     atopic  . Asthma Mother   . Heart failure Mother     CHF  . Other Father   . Anesthesia problems Neg Hx     Social History Social History  Substance Use Topics  . Smoking status: Former Smoker    Packs/day: 1.00    Years: 3.00    Types: Cigarettes, Pipe    Quit date: 05/20/1983  . Smokeless tobacco: Never Used  . Alcohol use Yes     Comment: "scotch once a year"     Allergies   Bacitracin-polymyxin b; Bee venom; Norco [hydrocodone-acetaminophen]; Penicillins; Shellfish-derived products; Codeine; Other; Tape; Sulfate; and Chlorhexidine   Review of Systems Review of Systems  Constitutional: Negative for fever.  Respiratory: Negative for shortness of breath.   Cardiovascular: Negative for chest pain.  Gastrointestinal: Negative for abdominal pain, nausea and vomiting.  Musculoskeletal: Positive for back pain and gait problem.  All other systems reviewed and are negative.    Physical Exam Updated Vital Signs BP 90/67   Pulse 108   Temp 97.8 F (36.6 C) (Oral)   Resp 20   Ht 5\' 9"  (1.753 m)   Wt 68 kg   SpO2 99%   BMI 22.15 kg/m   Physical Exam  Constitutional: He is oriented to person, place, and time. He appears well-developed and well-nourished. No distress.  Chronically ill appearing; appears older than stated age  HENT:  Head: Normocephalic and atraumatic.  Eyes: Right eye exhibits no discharge. Right conjunctiva is not injected. Right conjunctiva has no hemorrhage. No scleral icterus. Right pupil is round and reactive.  Left eye is absent  Neck: Normal range of motion. Neck supple.  Cardiovascular: Normal rate and regular rhythm.   No murmur heard. Pulmonary/Chest: Effort normal and breath sounds normal. No respiratory distress.  Abdominal: Soft. Bowel sounds are normal. He exhibits no distension. There is no tenderness.  Genitourinary:    Genitourinary Comments: Foley in place  Musculoskeletal: He exhibits no edema.  Back: No masses or rash. Prominence of his lower  back. Midline lumbar spinal tenderness. No point tenderness. There is lumbar paraspinal muscle tenderness. ROM deferred due to pain. Strength is 5/5 in lower extremities and normal plantar and dorsiflexion. Sensation: Intact sensation with light touch in lower extremities bilaterally Gait: Shuffling gait Reflexes: Patellar reflex is 2+ bilaterally, Achilles is 2+ bilaterally SLR: Negative seated straight leg raise  Right hip: No obvious swelling or deformity. No tenderness to palpation. FROM. N/V intact.  Neurological: He is alert and oriented to person, place, and time.  Skin: Skin is warm and dry.  Psychiatric: He has a normal mood and affect.  Nursing note and vitals reviewed.    ED Treatments / Results  Labs (all labs ordered are listed, but only abnormal results are displayed) Labs Reviewed  COMPREHENSIVE METABOLIC PANEL - Abnormal; Notable for the following:       Result Value   Potassium 3.2 (*)    Glucose, Bld 120 (*)    BUN 26 (*)    Creatinine, Ser 2.13 (*)    Total Protein 8.5 (*)    GFR calc non Af Amer 31 (*)    GFR calc Af Amer 36 (*)    All other components within normal limits  CBC WITH DIFFERENTIAL/PLATELET - Abnormal; Notable for the following:    WBC 12.3 (*)    Platelets 418 (*)    Neutro Abs 9.7 (*)    Monocytes Absolute 1.1 (*)    All other components within normal limits  URINALYSIS, ROUTINE W REFLEX MICROSCOPIC (NOT AT [facility]) - Abnormal; Notable for the following:    Color, Urine AMBER (*)    APPearance TURBID (*)    Hgb urine dipstick LARGE (*)    Bilirubin Urine MODERATE (*)    Protein, ur 100 (*)    Nitrite POSITIVE (*)    Leukocytes, UA MODERATE (*)    All other components within normal limits  URINE MICROSCOPIC-ADD ON - Abnormal; Notable for the following:    Squamous Epithelial / LPF 6-30 (*)    Bacteria, UA FEW  (*)    Casts HYALINE CASTS (*)    Crystals CA OXALATE CRYSTALS (*)    All other components within normal limits  I-STAT CG4 LACTIC ACID, ED - Abnormal; Notable for the following:    Lactic Acid, Venous 2.92 (*)    All other components within normal limits  URINE CULTURE  CK  I-STAT CG4 LACTIC ACID, ED    EKG  EKG Interpretation None       Radiology Dg Lumbar Spine Complete  Result Date: 01/08/2016 CLINICAL DATA:  Low back pain and right hip pain. EXAM: LUMBAR SPINE - COMPLETE 4+ VIEW COMPARISON:  CT abdomen/ pelvis 06/22/2015 FINDINGS: Compression fracture of L3 with approximately 50% height loss is new compared to 06/22/15. Compression fracture at L1 and compression deformities of T11 and T12 are unchanged. There is no listhesis. Multiple mildly dilated loops of small bowel are seen within the abdomen. IMPRESSION: 1. Age indeterminate compression fracture of the L3 vertebral body with approximately 50% height loss, new compared to 06/22/15. 2. Mildly dilated loops of small bowel seen within the abdomen. Correlate clinically for signs of obstruction. Electronically Signed   By: Deatra Robinson M.D.   On: 01/08/2016 18:26   Dg Hip Unilat W Or Wo Pelvis 2-3 Views Right  Result Date: 01/08/2016 CLINICAL DATA:  Acute right hip pain without known injury. EXAM: DG HIP (WITH OR WITHOUT PELVIS) 2-3V RIGHT COMPARISON:  Radiographs of January 01, 2016. FINDINGS:  There is no evidence of hip fracture or dislocation. There is no evidence of arthropathy or other focal bone abnormality. IMPRESSION: Normal right hip. Electronically Signed   By: Lupita RaiderJames  Green Jr, M.D.   On: 01/08/2016 18:24    Procedures Procedures (including critical care time)  Medications Ordered in ED Medications - No data to display   Initial Impression / Assessment and Plan / ED Course  I have reviewed the triage vital signs and the nursing notes.  Pertinent labs & imaging results that were available during my care of the patient  were reviewed by me and considered in my medical decision making (see chart for details).  Clinical Course   65 year old male presents with L3 compression fracture, AKI, and catheter associated UTI. Xray shows new compression fracture of L3. Pain medicine and TLSO brace ordered. Unlikely this is cauda equina since he has chronic back pain and chronic urinary retention. CBC remarkable for mild leukocytosis and BMP remarkable for worsening Scr. Was 1.13 on 8/20 and is 2.13 today. Also has mild hypokalemia. Patient endorses poor PO intake over last couple days. Lactic acid is 2.92. UA remarkable for few bacteria, moderate bilirubin, large hgb, moderate leukocytes, 100 protein, and 6-30 WBCs. Culture sent. Patient has been able to take Rocephin in the past even though allergy is listed as anaphylaxis from PCN. IVF and antibiotics started. Spoke with Dr. Julian ReilGardner who will admit patient for further management.  Final Clinical Impressions(s) / ED Diagnoses   Final diagnoses:  Urinary retention  Closed compression fracture of L3 lumbar vertebra, initial encounter (HCC)  Urinary tract infection associated with catheterization of urinary tract, unspecified indwelling urinary catheter type, initial encounter (HCC)  AKI (acute kidney injury) [facility](HCC)    New Prescriptions New Prescriptions   No medications on file     Bethel BornKelly Marie Gekas, PA-C 01/11/16 1418

## 2016-01-08 NOTE — H&P (Signed)
History and Physical    [name] [name] NFA:[id number] DOB: May 13, 1951 DOA: 01/08/2016   PCP: Gwynneth Aliment, MD Chief Complaint:  Chief Complaint  Patient presents with  . Back Pain    HPI: [name] [name] is a 65 y.o. male with medical history significant of nephrolithiasis, blindness, BPH, COPD.  Patient presents to the ED with c/o back pain.  Pain is located in center of back, has been increasing these past 2 weeks.  Onset after a ground level fall.  2 days ago he was seen in ED for abd pain which was due to urinary retention (has had this 2x previously as well), foley placed and he felt better.  He reports poor PO fluid intake over past 2 days though.  ED Course: Creatinine elevated to 2.1.  X ray shows L3 compression fx.  UA shows UTI.  Patient denies sharp shooting pains in legs, numbness in legs, saddle anesthesia, significant motor weakness to legs.  Review of Systems: As per HPI otherwise 10 point review of systems negative.    Past Medical History:  Diagnosis Date  . Anxiety   . Arthritis    spine  . Chronic lower back pain   . Contact dermatitis and other eczema, due to unspecified cause   . COPD (chronic obstructive pulmonary disease) (HCC)   . Cough   . Depression    patient constantly hitting left side of face "due to pain"  . Enlarged prostate   . Esophageal reflux   . H/O hiatal hernia   . Headache    PMH: migraines  . Hep C w/o coma, chronic (HCC) early 2013  . History of eye prosthesis   . History of kidney stones   . Hypertension    not on medication at this time  . 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 Multiple facial bone fractures (HCC)   . Multiple open wounds    "from esczema- scratching"  . Osteopenia   . Pancreatitis   . Pneumonia    in past had several times  . Seasonal allergies   . Ulcerative colitis   . Unspecified asthma(493.90)   . Unspecified sinusitis (chronic)     Past Surgical History:  Procedure  Laterality Date  . CARPAL TUNNEL RELEASE Right 06/15/2013   Procedure: RIGHT CARPAL TUNNEL RELEASE;  Surgeon: Marlowe Shores, MD;  Location: [facility];  Service: Orthopedics;  Laterality: Right;  . COLONOSCOPY W/ BIOPSIES AND POLYPECTOMY    . ENUCLEATION Left   . EYE SURGERY     3 Corneal transplant (bilateral eyes).    . FRACTURE SURGERY    . HARDWARE REMOVAL  10/09/2011   Procedure: HARDWARE REMOVAL;  Surgeon: Nadara Mustard, MD;  Location: [facility] OR;  Service: Orthopedics;  Laterality: Right;  Removal Deep Hardware Right Wrist, placement antibiotic beads.  . INGUINAL HERNIA REPAIR Left    age 81  . Litrotripsy    . metacarpal pinning Right    right hand  . NERVE, TENDON AND ARTERY REPAIR Right 12/05/2015   Procedure: RIGHT WRIST, HAND FLEXOR TENDON LENGTHENING;  Surgeon: Dairl Ponder, MD;  Location: [facility] OR;  Service: Orthopedics;  Laterality: Right;  . NEUROLYSIS OF MEDIAN NERVE Right 12/05/2015   Procedure: RIGHT MEDIAN NERVE NEUROLYSIS ;  Surgeon: Dairl Ponder, MD;  Location: [facility] OR;  Service: Orthopedics;  Laterality: Right;  . ORIF DISTAL RADIUS FRACTURE  09/12/11   right  . ORIF WRIST FRACTURE  Right 06/15/2013   Procedure: RIGHT RADIUS PSTEOTOMY, DISTAL ULNA RESECTION, AND DIGITAL FLEXOR LENGTHENING AS NEEDED;  Surgeon: Marlowe ShoresMatthew A Weingold, MD;  Location: [facility];  Service: Orthopedics;  Laterality: Right;  . PATELLA FRACTURE SURGERY     right  . RADIOLOGY WITH ANESTHESIA N/A 03/09/2014   Procedure: MRI - Cervical and Lumbar without Contrast;  Surgeon: Medication Radiologist, MD;  Location: [facility] OR;  Service: Radiology;  Laterality: N/A;  . RADIOLOGY WITH ANESTHESIA N/A 04/03/2015   Procedure: MRI OF BRAIN    (RADIOLOGY WITH ANESTHESIA);  Surgeon: Medication Radiologist, MD;  Location: [facility] OR;  Service: Radiology;  Laterality: N/A;  . TONSILLECTOMY AND ADENOIDECTOMY       reports that he quit smoking about 32 years ago. His smoking use included  Cigarettes and Pipe. He has a 3.00 pack-year smoking history. He has never used smokeless tobacco. He reports that he drinks alcohol. He reports that he does not use drugs.  Allergies  Allergen Reactions  . Bacitracin-Polymyxin B Other (See Comments)    Pt states that his ophthalmologist told him he was allergic to this.     . Bee Venom Anaphylaxis  . Codeine Anaphylaxis  . Norco [Hydrocodone-Acetaminophen] Other (See Comments)    Reaction:  Headaches   . Penicillins Anaphylaxis and Other (See Comments)    Tolerated numerous cephalosporins in past.  . Shellfish-Derived Products Anaphylaxis  . Other Hives and Other (See Comments)    Pt states that he is allergic to dust, pollen, and mold.   . Tape Hives  . Chlorhexidine Itching and Rash  . Sulfate Itching and Rash    Family History  Problem Relation Age of Onset  . Allergies Mother     atopic  . Asthma Mother   . Heart failure Mother     CHF  . Other Father   . Anesthesia problems Neg Hx       Prior to Admission medications   Medication Sig Start Date End Date Taking? Authorizing Provider  albuterol (PROVENTIL HFA;VENTOLIN HFA) 108 (90 Base) MCG/ACT inhaler Inhale 2 puffs into the lungs every 6 (six) hours as needed for wheezing or shortness of breath.   Yes Historical Provider, MD  albuterol (PROVENTIL) (2.5 MG/3ML) 0.083% nebulizer solution Take 3 mLs (2.5 mg total) by nebulization every 4 (four) hours as needed for wheezing or shortness of breath. 04/17/15  Yes Tammy S Parrett, NP  Calcium Carbonate-Vitamin D (CALCIUM 600+D) 600-400 MG-UNIT tablet Take 1 tablet by mouth daily.   Yes Historical Provider, MD  clobetasol cream (TEMOVATE) 0.05 % Apply 1 application topically 2 (two) times daily.   Yes Historical Provider, MD  diazepam (VALIUM) 10 MG tablet Take 1 tablet (10 mg total) by mouth 3 (three) times daily. 06/06/14  Yes Osvaldo ShipperGokul Krishnan, MD  EPINEPHrine (EPIPEN 2-PAK) 0.3 mg/0.3 mL IJ SOAJ injection Inject 0.3 mg into the  muscle once as needed (for severe allergic reaction).   Yes Historical Provider, MD  Fluocinolone Acetonide (DERMA-SMOOTHE/FS BODY) 0.01 % OIL Apply 1 application topically daily. Pt applies to scalp.   Yes Historical Provider, MD  fluticasone (FLONASE) 50 MCG/ACT nasal spray Place 2 sprays into both nostrils 2 (two) times daily.   Yes Historical Provider, MD  hydrOXYzine (ATARAX/VISTARIL) 25 MG tablet Take 25 mg by mouth 3 (three) times daily as needed for itching.   Yes Historical Provider, MD  mometasone-formoterol (DULERA) 200-5 MCG/ACT AERO Inhale 2 puffs into the lungs 2 (two) times daily.   Yes  Historical Provider, MD  omeprazole (PRILOSEC) 40 MG capsule Take 40 mg by mouth daily as needed (for acid reflux).    Yes Historical Provider, MD  polyethylene glycol (MIRALAX / GLYCOLAX) packet Take 17 g by mouth daily.   Yes Historical Provider, MD  prednisoLONE acetate (PRED FORTE) 1 % ophthalmic suspension Place 1 drop into both eyes daily.    Yes Historical Provider, MD  sertraline (ZOLOFT) 100 MG tablet Take 2 tablets (200 mg total) by mouth daily. 06/11/14  Yes Azalia BilisKevin Campos, MD  tamsulosin (FLOMAX) 0.4 MG CAPS capsule Take 0.4 mg by mouth daily after breakfast.    Yes Historical Provider, MD  temazepam (RESTORIL) 15 MG capsule Take 15 mg by mouth at bedtime as needed for sleep.   Yes Historical Provider, MD    Physical Exam: Vitals:   01/08/16 1735 01/08/16 1835 01/08/16 1940 01/08/16 2018  BP: 127/94  118/84 125/86  Pulse: 98  77 82  Resp: 16  18 18   Temp: 98.2 F (36.8 C) 98.2 F (36.8 C)    TempSrc: Oral Rectal    SpO2: 100%  97% 96%  Weight:      Height:          Constitutional: NAD, calm, comfortable Eyes: PERRL, lids and conjunctivae normal ENMT: Mucous membranes are moist. Posterior pharynx clear of any exudate or lesions.Normal dentition.  Neck: normal, supple, no masses, no thyromegaly Respiratory: clear to auscultation bilaterally, no wheezing, no crackles. Normal  respiratory effort. No accessory muscle use.  Cardiovascular: Regular rate and rhythm, no murmurs / rubs / gallops. No extremity edema. 2+ pedal pulses. No carotid bruits.  Abdomen: no tenderness, no masses palpated. No hepatosplenomegaly. Bowel sounds positive.  Musculoskeletal: no clubbing / cyanosis. No joint deformity upper and lower extremities. Good ROM, no contractures. Normal muscle tone.  Skin: no rashes, lesions, ulcers. No induration Neurologic: CN 2-12 grossly intact. Sensation intact, DTR normal. Strength 5/5 in all 4.  Psychiatric: Normal judgment and insight. Alert and oriented x 3. Normal mood.    Labs on Admission: I have personally reviewed following labs and imaging studies  CBC:  Recent Labs Lab 01/07/16 0020 01/08/16 1528  WBC 10.3 12.3*  NEUTROABS 8.0* 9.7*  HGB 15.4 16.0  HCT 43.2 46.5  MCV 90.2 91.7  PLT 301 418*   Basic Metabolic Panel:  Recent Labs Lab 01/06/16 2029 01/08/16 1528  NA 142 139  K 3.6 3.2*  CL 109 103  CO2 22 24  GLUCOSE 113* 120*  BUN 15 26*  CREATININE 1.13 2.13*  CALCIUM 9.8 9.9   GFR: Estimated Creatinine Clearance: 33.7 mL/min (by C-G formula based on SCr of 2.13 mg/dL). Liver Function Tests:  Recent Labs Lab 01/08/16 1528  AST 29  ALT 32  ALKPHOS 119  BILITOT 1.2  PROT 8.5*  ALBUMIN 4.8   No results for input(s): LIPASE, AMYLASE in the last 168 hours. No results for input(s): AMMONIA in the last 168 hours. Coagulation Profile: No results for input(s): INR, PROTIME in the last 168 hours. Cardiac Enzymes:  Recent Labs Lab 01/08/16 1558  CKTOTAL 106   BNP (last 3 results) No results for input(s): PROBNP in the last 8760 hours. HbA1C: No results for input(s): HGBA1C in the last 72 hours. CBG: No results for input(s): GLUCAP in the last 168 hours. Lipid Profile: No results for input(s): CHOL, HDL, LDLCALC, TRIG, CHOLHDL, LDLDIRECT in the last 72 hours. Thyroid Function Tests: No results for input(s):  TSH, T4TOTAL, FREET4, T3FREE, THYROIDAB  in the last 72 hours. Anemia Panel: No results for input(s): VITAMINB12, FOLATE, FERRITIN, TIBC, IRON, RETICCTPCT in the last 72 hours. Urine analysis:    Component Value Date/Time   COLORURINE AMBER (A) 01/08/2016 1905   APPEARANCEUR TURBID (A) 01/08/2016 1905   LABSPEC 1.027 01/08/2016 1905   PHURINE 5.0 01/08/2016 1905   GLUCOSEU NEGATIVE 01/08/2016 1905   HGBUR LARGE (A) 01/08/2016 1905   BILIRUBINUR MODERATE (A) 01/08/2016 1905   KETONESUR NEGATIVE 01/08/2016 1905   PROTEINUR 100 (A) 01/08/2016 1905   UROBILINOGEN 1.0 06/08/2014 1306   NITRITE POSITIVE (A) 01/08/2016 1905   LEUKOCYTESUR MODERATE (A) 01/08/2016 1905   Sepsis Labs: @LABRCNTIP (procalcitonin:4,lacticidven:4) )No results found for this or any previous visit (from the past 240 hour(s)).   Radiological Exams on Admission: Dg Lumbar Spine Complete  Result Date: 01/08/2016 CLINICAL DATA:  Low back pain and right hip pain. EXAM: LUMBAR SPINE - COMPLETE 4+ VIEW COMPARISON:  CT abdomen/ pelvis 06/22/2015 FINDINGS: Compression fracture of L3 with approximately 50% height loss is new compared to 06/22/15. Compression fracture at L1 and compression deformities of T11 and T12 are unchanged. There is no listhesis. Multiple mildly dilated loops of small bowel are seen within the abdomen. IMPRESSION: 1. Age indeterminate compression fracture of the L3 vertebral body with approximately 50% height loss, new compared to 06/22/15. 2. Mildly dilated loops of small bowel seen within the abdomen. Correlate clinically for signs of obstruction. Electronically Signed   By: Deatra Robinson M.D.   On: 01/08/2016 18:26   Dg Hip Unilat W Or Wo Pelvis 2-3 Views Right  Result Date: 01/08/2016 CLINICAL DATA:  Acute right hip pain without known injury. EXAM: DG HIP (WITH OR WITHOUT PELVIS) 2-3V RIGHT COMPARISON:  Radiographs of January 01, 2016. FINDINGS: There is no evidence of hip fracture or dislocation. There is no  evidence of arthropathy or other focal bone abnormality. IMPRESSION: Normal right hip. Electronically Signed   By: Lupita Raider, M.D.   On: 01/08/2016 18:24    EKG: Independently reviewed.  Assessment/Plan Principal Problem:   Catheter-associated urinary tract infection (HCC) Active Problems:   Urinary retention   AKI (acute kidney injury) (HCC)   Closed compression fracture of L3 lumbar vertebra (HCC)    1. CAUTI - 1. Rocephin 2. Cultures pending 2. Urinary retention - 1. Checking PSA 2. Foley in place 3. No other signs or symptoms to suggest cauda equina. 3. Closed compression fx of L3 - 1. Pain control with percocet 2. TLSO brace 3. If unable to ambulate with PT by tomorrow AM then would obtain MRI L spine but as noted above Cauda equina seems unlikely based off of clinical presentation. 4. AKI -  1. Likely dehydration 2. Repeat BMP in AM 3. Order renal US or CT stone if no improvement   DVT prophylaxis: Heparin [place] Code Status: Full Family Communication: No family in room Consults called: None Admission status: Admit to obs   Kriya Westra, Heywood Iles DO [facility] Pager [phone] from 7PM-7AM  If 7AM-7PM, please contact the day physician for the patient [email or web address] Password [email or web address]  01/08/2016, 9:10 PM

## 2016-01-09 ENCOUNTER — Observation Stay (HOSPITAL_COMMUNITY): Payer: Medicaid Other

## 2016-01-09 DIAGNOSIS — N401 Enlarged prostate with lower urinary tract symptoms: Secondary | ICD-10-CM | POA: Diagnosis present

## 2016-01-09 DIAGNOSIS — M545 Low back pain: Secondary | ICD-10-CM | POA: Diagnosis present

## 2016-01-09 DIAGNOSIS — N179 Acute kidney failure, unspecified: Secondary | ICD-10-CM

## 2016-01-09 DIAGNOSIS — B182 Chronic viral hepatitis C: Secondary | ICD-10-CM | POA: Diagnosis present

## 2016-01-09 DIAGNOSIS — S32030A Wedge compression fracture of third lumbar vertebra, initial encounter for closed fracture: Secondary | ICD-10-CM | POA: Diagnosis not present

## 2016-01-09 DIAGNOSIS — S32039A Unspecified fracture of third lumbar vertebra, initial encounter for closed fracture: Secondary | ICD-10-CM | POA: Diagnosis present

## 2016-01-09 DIAGNOSIS — R339 Retention of urine, unspecified: Secondary | ICD-10-CM

## 2016-01-09 DIAGNOSIS — T83511A Infection and inflammatory reaction due to indwelling urethral catheter, initial encounter: Secondary | ICD-10-CM | POA: Diagnosis not present

## 2016-01-09 DIAGNOSIS — K5190 Ulcerative colitis, unspecified, without complications: Secondary | ICD-10-CM | POA: Diagnosis present

## 2016-01-09 DIAGNOSIS — N390 Urinary tract infection, site not specified: Secondary | ICD-10-CM

## 2016-01-09 DIAGNOSIS — F419 Anxiety disorder, unspecified: Secondary | ICD-10-CM | POA: Diagnosis present

## 2016-01-09 DIAGNOSIS — Z87891 Personal history of nicotine dependence: Secondary | ICD-10-CM | POA: Diagnosis not present

## 2016-01-09 DIAGNOSIS — T83518A Infection and inflammatory reaction due to other urinary catheter, initial encounter: Secondary | ICD-10-CM | POA: Diagnosis present

## 2016-01-09 DIAGNOSIS — Z87442 Personal history of urinary calculi: Secondary | ICD-10-CM | POA: Diagnosis not present

## 2016-01-09 DIAGNOSIS — I10 Essential (primary) hypertension: Secondary | ICD-10-CM | POA: Diagnosis present

## 2016-01-09 DIAGNOSIS — M8580 Other specified disorders of bone density and structure, unspecified site: Secondary | ICD-10-CM | POA: Diagnosis present

## 2016-01-09 DIAGNOSIS — K219 Gastro-esophageal reflux disease without esophagitis: Secondary | ICD-10-CM | POA: Diagnosis present

## 2016-01-09 DIAGNOSIS — R338 Other retention of urine: Secondary | ICD-10-CM | POA: Diagnosis present

## 2016-01-09 DIAGNOSIS — Z825 Family history of asthma and other chronic lower respiratory diseases: Secondary | ICD-10-CM | POA: Diagnosis not present

## 2016-01-09 DIAGNOSIS — L299 Pruritus, unspecified: Secondary | ICD-10-CM | POA: Diagnosis present

## 2016-01-09 DIAGNOSIS — Z8249 Family history of ischemic heart disease and other diseases of the circulatory system: Secondary | ICD-10-CM | POA: Diagnosis not present

## 2016-01-09 DIAGNOSIS — J329 Chronic sinusitis, unspecified: Secondary | ICD-10-CM | POA: Diagnosis present

## 2016-01-09 DIAGNOSIS — W1830XA Fall on same level, unspecified, initial encounter: Secondary | ICD-10-CM | POA: Diagnosis present

## 2016-01-09 DIAGNOSIS — J449 Chronic obstructive pulmonary disease, unspecified: Secondary | ICD-10-CM | POA: Diagnosis present

## 2016-01-09 DIAGNOSIS — F329 Major depressive disorder, single episode, unspecified: Secondary | ICD-10-CM | POA: Diagnosis present

## 2016-01-09 DIAGNOSIS — K5900 Constipation, unspecified: Secondary | ICD-10-CM | POA: Diagnosis present

## 2016-01-09 DIAGNOSIS — Y846 Urinary catheterization as the cause of abnormal reaction of the patient, or of later complication, without mention of misadventure at the time of the procedure: Secondary | ICD-10-CM | POA: Diagnosis present

## 2016-01-09 DIAGNOSIS — G8929 Other chronic pain: Secondary | ICD-10-CM | POA: Diagnosis present

## 2016-01-09 DIAGNOSIS — H548 Legal blindness, as defined in USA: Secondary | ICD-10-CM | POA: Diagnosis present

## 2016-01-09 LAB — BASIC METABOLIC PANEL
Anion gap: 5 (ref 5–15)
BUN: 31 mg/dL — AB (ref 6–20)
CO2: 25 mmol/L (ref 22–32)
CREATININE: 1.63 mg/dL — AB (ref 0.61–1.24)
Calcium: 8.5 mg/dL — ABNORMAL LOW (ref 8.9–10.3)
Chloride: 110 mmol/L (ref 101–111)
GFR calc Af Amer: 50 mL/min — ABNORMAL LOW (ref >60)
GFR, EST NON AFRICAN AMERICAN: 43 mL/min — AB (ref >60)
GLUCOSE: 114 mg/dL — AB (ref 65–99)
Potassium: 3.3 mmol/L — ABNORMAL LOW (ref 3.5–5.1)
SODIUM: 140 mmol/L (ref 135–145)

## 2016-01-09 LAB — PSA: PSA: 4.12 ng/mL — AB (ref 0.00–4.00)

## 2016-01-09 MED ORDER — MORPHINE SULFATE (PF) 2 MG/ML IV SOLN
2.0000 mg | INTRAVENOUS | Status: DC | PRN
  Administered 2016-01-09 – 2016-01-10 (×2): 2 mg via INTRAVENOUS
  Filled 2016-01-09 (×2): qty 1

## 2016-01-09 MED ORDER — POTASSIUM CHLORIDE CRYS ER 20 MEQ PO TBCR
40.0000 meq | EXTENDED_RELEASE_TABLET | Freq: Once | ORAL | Status: AC
  Administered 2016-01-09: 40 meq via ORAL
  Filled 2016-01-09: qty 2

## 2016-01-09 MED ORDER — OXYCODONE-ACETAMINOPHEN 5-325 MG PO TABS
1.0000 | ORAL_TABLET | Freq: Four times a day (QID) | ORAL | Status: DC
  Administered 2016-01-09 – 2016-01-10 (×4): 1 via ORAL
  Filled 2016-01-09 (×4): qty 1

## 2016-01-09 MED ORDER — POLYETHYLENE GLYCOL 3350 17 G PO PACK
17.0000 g | PACK | Freq: Every day | ORAL | Status: DC
  Administered 2016-01-09 – 2016-01-11 (×2): 17 g via ORAL
  Filled 2016-01-09 (×3): qty 1

## 2016-01-09 MED ORDER — POLYETHYLENE GLYCOL 3350 17 G PO PACK: 17.0000 g | PACK | Freq: Every day | ORAL | Status: DC

## 2016-01-09 MED ORDER — HYDROXYZINE HCL 10 MG PO TABS
10.0000 mg | ORAL_TABLET | Freq: Three times a day (TID) | ORAL | Status: DC | PRN
  Filled 2016-01-09: qty 1

## 2016-01-09 NOTE — Progress Notes (Signed)
Patient refused MRI. Per patient he needs sedation. Texted Dr. Butler Denmarkizwan earlier. MRI tech also notified.

## 2016-01-09 NOTE — Progress Notes (Addendum)
Spoke with Allegra GranaBob Rn from [facility] [address] concerning back brace. "Back brace are usually wear when out of bed," per AutolivBob RN. Will notify ortho tech.

## 2016-01-09 NOTE — Evaluation (Signed)
Physical Therapy Evaluation Patient Details Name: [name] [name] MRN: [id number] DOB: 1950/10/13 Today's Date: 01/09/2016   History of Present Illness  [name] [name] is a 65 y.o. male came to ED 01/08/16 with medical history significant of nephrolithiasis, blindness, BPH, COPD.  Patient presents to the ED with c/o back pain.  Pain is located in center of back, has been increasing these past 2 weeks.  Onset after a ground level fall  2 days PTA,he was seen in ED for abd pain which was due to urinary retention (has had this 2x previously as well), foley placed and he felt better. Has H/O multiple RUE surgeries and nerve impairment.  Clinical Impression  The patient is requiring  Assistance to don the TLSO in sitting. He has RUE dysfunction but did recall  to proper position of the  Brace with VC , required assistance to locate the Pull strings on the right and attach in the slot.  The straps on the upper brace are designed to place under arms to prevent riding up(this Midwif[employer]riter called [employer] for clarification) although the patient reports that he was instructed to wear the straps over the shoulders. The concern for the brace is his ability to don the brace and maintain it with visual and RUE deficits. Recommend OT consult and HHPT and Increased CAP personnel if possible.  Pt admitted with above diagnosis. Pt currently with functional limitations due to the deficits listed below (see PT Problem List).  Pt will benefit from skilled PT to increase their independence and safety with mobility to allow discharge to the venue listed below.       Follow Up Recommendations Home health PT;Supervision/Assistance - 24 hour    Equipment Recommendations  None recommended by PT    Recommendations for Other Services OT consult     Precautions / Restrictions Precautions Precautions: Fall Precaution Comments: legally blind Required Braces or Orthoses: Spinal Brace;Other Brace/Splint Spinal Brace:  Thoracolumbosacral orthotic Other Brace/Splint: R wrist splint      Mobility  Bed Mobility Overal bed mobility: Needs Assistance Bed Mobility: Supine to Sit;Rolling Rolling: Supervision   Supine to sit: Supervision     General bed mobility comments: encouraged to roll, extra time to get self to sitting.  Transfers Overall transfer level: Needs assistance Equipment used: Ambulation equipment used Transfers: Sit to/from Stand           General transfer comment: stands with extra effort to rise and steady, not assist provided by PT. Holding onto 'Blind" cane  Ambulation/Gait Ambulation/Gait assistance: Min guard Ambulation Distance (Feet): 150 Feet Assistive device:  (blind cane ) Gait Pattern/deviations: Step-through pattern;Decreased stride length;Shuffle     General Gait Details: taking very small step length, patient states" choppy steps" because I don't want to fall.  Stairs            Wheelchair Mobility    Modified Rankin (Stroke Patients Only)       Balance Overall balance assessment: History of Falls;Needs assistance Sitting-balance support: Feet supported;No upper extremity supported Sitting balance-Leahy Scale: Good     Standing balance support: During functional activity;Single extremity supported Standing balance-Leahy Scale: Poor                               Pertinent Vitals/Pain Pain Assessment: 0-10 Pain Score: 7  Pain Location: mid back Pain Descriptors / Indicators: Discomfort;Grimacing Pain Intervention(s): Premedicated before session;Patient requesting pain meds-RN notified  Home Living Family/patient expects to be discharged to:: Private residence Living Arrangements: Alone;Non-relatives/Friends Available Help at Discharge: Home health;Neighbor;Personal care attendant Type of Home: House Home Access: Stairs to enter Entrance Stairs-Rails: Doctor, general practiceight;Left Entrance Stairs-Number of Steps: 2 Home Layout: One  level Home Equipment: Other (comment) Additional Comments: cane for the blind    Prior Function                 Hand Dominance        Extremity/Trunk Assessment   Upper Extremity Assessment: RUE deficits/detail RUE Deficits / Details: decreased fine motor control, has a splint         Lower Extremity Assessment: Generalized weakness      Cervical / Trunk Assessment: Kyphotic;Other exceptions  Communication      Cognition Arousal/Alertness: Awake/alert Behavior During Therapy: WFL for tasks assessed/performed Overall Cognitive Status: Within Functional Limits for tasks assessed                      General Comments      Exercises        Assessment/Plan    PT Assessment Patient needs continued PT services  PT Diagnosis Difficulty walking;Generalized weakness;Acute pain   PT Problem List Decreased strength;Decreased activity tolerance;Decreased balance;Decreased mobility;Decreased knowledge of precautions;Decreased safety awareness;Decreased knowledge of use of DME;Pain;Decreased coordination  PT Treatment Interventions DME instruction;Gait training;Stair training;Functional mobility training;Therapeutic activities;Therapeutic exercise;Patient/family education   PT Goals (Current goals can be found in the Care Plan section) Acute Rehab PT Goals Patient Stated Goal: to go home PT Goal Formulation: With patient Time For Goal Achievement: 01/23/16 Potential to Achieve Goals: Good    Frequency Min 3X/week   Barriers to discharge Decreased caregiver support patient reports that he can request increased CAP coverage    Co-evaluation               End of Session Equipment Utilized During Treatment: Gait belt;Back brace Activity Tolerance: Patient limited by pain Patient left: in chair;with call bell/phone within reach;with chair alarm set Nurse Communication: Mobility status    Functional Assessment Tool Used: clinical  judgement Functional Limitation: Mobility: Walking and moving around Mobility: Walking and Moving Around Current Status ([id number](G8978): At least 20 percent but less than 40 percent impaired, limited or restricted Mobility: Walking and Moving Around Goal Status [phone](G8979): At least 1 percent but less than 20 percent impaired, limited or restricted    Time: 0828-0856 PT Time Calculation (min) (ACUTE ONLY): 28 min   Charges:   PT Evaluation $PT Eval Moderate Complexity: 1 Procedure PT Treatments $Gait Training: 8-22 mins   PT G Codes:   PT G-Codes **NOT FOR INPATIENT CLASS** Functional Assessment Tool Used: clinical judgement Functional Limitation: Mobility: Walking and moving around Mobility: Walking and Moving Around Current Status ([id number](G8978): At least 20 percent but less than 40 percent impaired, limited or restricted Mobility: Walking and Moving Around Goal Status [phone](G8979): At least 1 percent but less than 20 percent impaired, limited or restricted    Rada HayHill, Fong Mccarry Elizabeth 01/09/2016, 9:19 AM  Blanchard KelchKaren Buzz Axel PT [phone]

## 2016-01-09 NOTE — Progress Notes (Signed)
Patient had a large bowel movement earlier.

## 2016-01-09 NOTE — Evaluation (Signed)
Occupational Therapy Evaluation Patient Details Name: [name] PattenRoger D [name] MRN: [id number] DOB: 1950-09-28 Today's Date: 01/09/2016    History of Present Illness [name] [name] is a 65 y.o. male came to ED 01/08/16 with medical history significant of nephrolithiasis, blindness, BPH, COPD.  Patient presents to the ED with c/o back pain.  Pain is located in center of back, has been increasing these past 2 weeks.  Onset after a ground level fall  2 days PTA,he was seen in ED for abd pain which was due to urinary retention (has had this 2x previously as well), foley placed and he felt better. Has H/O multiple RUE surgeries and nerve impairment.  Last sx was 7/19 by Dr Mina MarbleWeingold.   Clinical Impression   Pt was admitted for the above.      Follow Up Recommendations  Supervision/Assistance - 24 hour (with as much support as he can get )    Equipment Recommendations   (3:1 if pt doesn't have, and if agreeable)    Recommendations for Other Services       Precautions / Restrictions Precautions Precautions: Fall Precaution Comments: legally blind Required Braces or Orthoses: Spinal Brace;Other Brace/Splint Spinal Brace: Thoracolumbosacral orthotic Other Brace/Splint: R wrist splint:  sx 7/19 with Dr Mina MarbleWeingold for finger/wrist lengthening and median nerve neurolysis Restrictions Weight Bearing Restrictions: No      Mobility Bed Mobility     Rolling: Supervision   Supine to sit: Supervision     General bed mobility comments: extra time  Transfers                      Balance                                            ADL Overall ADL's : Needs assistance/impaired         Upper Body Bathing: Minimal assitance;Sitting   Lower Body Bathing: Min guard;Sit to/from stand   Upper Body Dressing : Minimal assistance;Sitting (TLSO mod A)   Lower Body Dressing: Min guard;Sit to/from stand                 General ADL Comments: Pt cannot don brace  himself at this time. Plans to sleep with it under him and  then secure it.  He said orthotist told him to place straps over his shoulders onto sternal portion.  [facility] called and these are usually placed under the arm to avoid riding up.  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  Pt states he lost balance backwards when bending forward to put Ensure away. Encouraged him to let CAPS worker do tasks that requiring bending over.  He can cross legs for adls     Vision     Perception     Praxis      Pertinent Vitals/Pain Pain Score: 6  Pain Location: back Pain Descriptors / Indicators: Discomfort;Grimacing Pain Intervention(s): Limited activity within patient's tolerance;Monitored during session;Premedicated before session;Repositioned     Hand Dominance Right   Extremity/Trunk Assessment Upper Extremity Assessment RUE Deficits / Details: pt numb at finger tips.  Has a splint and says this is for comfort: called Dr Ronie SpiesWeingold's office (left message for therapy) as pt has been non compliant in the past.           Communication Communication Communication: No difficulties   Cognition Arousal/Alertness: Awake/alert Behavior During Therapy:  WFL for tasks assessed/performed Overall Cognitive Status: No family/caregiver present to determine baseline cognitive functioning                     General Comments       Exercises       Shoulder Instructions      Home Living Family/patient expects to be discharged to:: Private residence Living Arrangements: Alone;Non-relatives/Friends Available Help at Discharge: Home health;Neighbor;Personal care attendant Type of Home: House       Home Layout: One level     Bathroom Shower/Tub: Tub/shower unit (pt sponge bathes)   Bathroom Toilet: Standard         Additional Comments: cane for the blind      Prior Functioning/Environment Level of Independence: Needs assistance        Comments: pt states he is mod I for adls.  He has CAPS  worker for meal prep and IADLs    OT Diagnosis: Generalized weakness;Acute pain   OT Problem List: Decreased strength;Decreased activity tolerance;Pain;Impaired vision/perception   OT Treatment/Interventions: Self-care/ADL training;DME and/or AE instruction;Patient/family education;Balance training;Therapeutic activities    OT Goals(Current goals can be found in the care plan section) Acute Rehab OT Goals Patient Stated Goal: to go home OT Goal Formulation: With patient Time For Goal Achievement: 01/16/16 Potential to Achieve Goals: Good ADL Goals Pt Will Transfer to Toilet: with supervision;ambulating;bedside commode;regular height toilet (vs) Pt Will Perform Toileting - Clothing Manipulation and hygiene: with supervision;sit to/from stand Additional ADL Goal #1: pt will don TLSO brace with set up  OT Frequency: Min 2X/week   Barriers to D/C:            Co-evaluation              End of Session    Activity Tolerance: Patient tolerated treatment well Patient left: in bed;with call bell/phone within reach;with bed alarm set   Time: 6045-40981205-1235 OT Time Calculation (min): 30 min Charges:  OT General Charges $OT Visit: 1 Procedure OT Evaluation $OT Eval Moderate Complexity: 1 Procedure OT Treatments $Self Care/Home Management : 8-22 mins G-Codes: OT G-codes **NOT FOR INPATIENT CLASS** Functional Assessment Tool Used: clinical observation and judgment Functional Limitation: Self care Self Care Current Status (J1914(G8987): At least 40 percent but less than 60 percent impaired, limited or restricted Self Care Goal Status (N8295(G8988): At least 1 percent but less than 20 percent impaired, limited or restricted  Delina Kruczek 01/09/2016, 1:16 PM Marica OtterMaryellen Arnice Vanepps, OTR/L [phone] 01/09/2016

## 2016-01-09 NOTE — Progress Notes (Signed)
Manny[facility]. "Usually patient only wear these braces while out of bed"  "Please have day shift nurse check with MD in the am." Patient appears to be resting comfortably with no acute distress noted. Will continue to monitor and assess patient needs and concerns.

## 2016-01-09 NOTE — Progress Notes (Signed)
PROGRESS NOTE    [name] [name]  ZOX:[id number] DOB: [birth year] DOA: 01/08/2016  PCP: Gwynneth Aliment, MD   Brief Narrative:  [name] [name] is a 65 y.o. male with medical history significant of nephrolithiasis, blindness, BPH, COPD.  Patient presents to the ED with c/o back pain.  Pain is located in center of back, has been increasing these past 2 weeks.  Onset after a ground level fall.  2 days ago he was seen in ED for abd pain which was due to urinary retention (has had this 2x previously as well), foley placed and he felt better.  He reports poor PO fluid intake over past 2 days.  Subjective: Back pain is moderately well controlled at rest but severe when walking - asking to increase pain meds. No new symptoms.   Assessment & Plan:   Principal Problem:   Catheter-associated urinary tract infection  - leukocytosis noted on admission - Rocephin- f/u cultures  Active Problems:   Urinary retention/ BPH - ? Cauda equina- constipated as well- obtain MRI - cont foley cath for now- retention likely due to pain and possibly also narcotics- has had retention in relation to surgery in the past- already on Flomax - appt with Urology made for next week    AKI (acute kidney injury) - prerenal? Due to poor PO intake in past few days- improved today- cont IVF- follow oral intake    Closed compression fracture of L3 lumbar vertebra  - possibly a new fracture which is causing his pain- has 50% height loss - TLSO brace ordered- pain control with Percocet - has 2 prior compression fractures in T11 and T12 - Pt eval- likely can go home with [facility]    DVT prophylaxis: heparin Code Status: Full code Family Communication:   Disposition Plan: home when stable Consultants:   none Procedures:   none Antimicrobials:  Anti-infectives    Start     Dose/Rate Route Frequency Ordered Stop   01/08/16 2215  cefTRIAXone (ROCEPHIN) 1 g in dextrose 5 % 50 mL IVPB     1 g 100 mL/hr over 30  Minutes Intravenous Daily at bedtime 01/08/16 2045     01/08/16 2015  aztreonam (AZACTAM) 1 g in dextrose 5 % 50 mL IVPB  Status:  Discontinued     1 g 100 mL/hr over 30 Minutes Intravenous  Once 01/08/16 2006 01/08/16 2141       Objective: Vitals:   01/08/16 2018 01/08/16 2206 01/08/16 2300 01/09/16 0421  BP: 125/86 127/84 (!) 154/90 122/79  Pulse: 82 68 74 74  Resp: 18 17 18 18   Temp:   97.8 F (36.6 C) 98.3 F (36.8 C)  TempSrc:   Oral Oral  SpO2: 96% 98% 98% 99%  Weight:   66.3 kg (146 lb 2.6 oz)   Height:   5\' 9"  (1.753 m)     Intake/Output Summary (Last 24 hours) at 01/09/16 1506 Last data filed at 01/09/16 1406  Gross per 24 hour  Intake             2932 ml  Output              530 ml  Net             2402 ml   Filed Weights   01/08/16 1441 01/08/16 2300  Weight: 68 kg (150 lb) 66.3 kg (146 lb 2.6 oz)    Examination: General exam: Appears comfortable  HEENT: PERRLA, oral mucosa moist, no  sclera icterus or thrush Respiratory system: Clear to auscultation. Respiratory effort normal. Cardiovascular system: S1 & S2 heard, RRR.  No murmurs  Gastrointestinal system: Abdomen soft, non-tender, nondistended. Normal bowel sound. No organomegaly Central nervous system: Alert and oriented. No focal neurological deficits.- no sensory or motor deficits  Extremities: No cyanosis, clubbing or edema Skin: No rashes or ulcers Psychiatry:  Mood & affect appropriate.     Data Reviewed: I have personally reviewed following labs and imaging studies  CBC:  Recent Labs Lab 01/07/16 0020 01/08/16 1528  WBC 10.3 12.3*  NEUTROABS 8.0* 9.7*  HGB 15.4 16.0  HCT 43.2 46.5  MCV 90.2 91.7  PLT 301 418*   Basic Metabolic Panel:  Recent Labs Lab 01/06/16 2029 01/08/16 1528 01/08/16 2206 01/09/16 0353  NA 142 139  --  140  K 3.6 3.2*  --  3.3*  CL 109 103  --  110  CO2 22 24  --  25  GLUCOSE 113* 120*  --  114*  BUN 15 26*  --  31*  CREATININE 1.13 2.13* 2.00* 1.63*    CALCIUM 9.8 9.9  --  8.5*   GFR: Estimated Creatinine Clearance: 42.9 mL/min (by C-G formula based on SCr of 1.63 mg/dL). Liver Function Tests:  Recent Labs Lab 01/08/16 1528  AST 29  ALT 32  ALKPHOS 119  BILITOT 1.2  PROT 8.5*  ALBUMIN 4.8   No results for input(s): LIPASE, AMYLASE in the last 168 hours. No results for input(s): AMMONIA in the last 168 hours. Coagulation Profile: No results for input(s): INR, PROTIME in the last 168 hours. Cardiac Enzymes:  Recent Labs Lab 01/08/16 1558  CKTOTAL 106   BNP (last 3 results) No results for input(s): PROBNP in the last 8760 hours. HbA1C: No results for input(s): HGBA1C in the last 72 hours. CBG: No results for input(s): GLUCAP in the last 168 hours. Lipid Profile: No results for input(s): CHOL, HDL, LDLCALC, TRIG, CHOLHDL, LDLDIRECT in the last 72 hours. Thyroid Function Tests: No results for input(s): TSH, T4TOTAL, FREET4, T3FREE, THYROIDAB in the last 72 hours. Anemia Panel: No results for input(s): VITAMINB12, FOLATE, FERRITIN, TIBC, IRON, RETICCTPCT in the last 72 hours. Urine analysis:    Component Value Date/Time   COLORURINE AMBER (A) 01/08/2016 1905   APPEARANCEUR TURBID (A) 01/08/2016 1905   LABSPEC 1.027 01/08/2016 1905   PHURINE 5.0 01/08/2016 1905   GLUCOSEU NEGATIVE 01/08/2016 1905   HGBUR LARGE (A) 01/08/2016 1905   BILIRUBINUR MODERATE (A) 01/08/2016 1905   KETONESUR NEGATIVE 01/08/2016 1905   PROTEINUR 100 (A) 01/08/2016 1905   UROBILINOGEN 1.0 06/08/2014 1306   NITRITE POSITIVE (A) 01/08/2016 1905   LEUKOCYTESUR MODERATE (A) 01/08/2016 1905   Sepsis Labs: @LABRCNTIP (procalcitonin:4,lacticidven:4) )No results found for this or any previous visit (from the past 240 hour(s)).       Radiology Studies: Dg Lumbar Spine Complete  Result Date: 01/08/2016 CLINICAL DATA:  Low back pain and right hip pain. EXAM: LUMBAR SPINE - COMPLETE 4+ VIEW COMPARISON:  CT abdomen/ pelvis 06/22/2015 FINDINGS:  Compression fracture of L3 with approximately 50% height loss is new compared to 06/22/15. Compression fracture at L1 and compression deformities of T11 and T12 are unchanged. There is no listhesis. Multiple mildly dilated loops of small bowel are seen within the abdomen. IMPRESSION: 1. Age indeterminate compression fracture of the L3 vertebral body with approximately 50% height loss, new compared to 06/22/15. 2. Mildly dilated loops of small bowel seen within the abdomen. Correlate clinically  for signs of obstruction. Electronically Signed   By: Deatra RobinsonKevin  Herman M.D.   On: 01/08/2016 18:26   Dg Hip Unilat W Or Wo Pelvis 2-3 Views Right  Result Date: 01/08/2016 CLINICAL DATA:  Acute right hip pain without known injury. EXAM: DG HIP (WITH OR WITHOUT PELVIS) 2-3V RIGHT COMPARISON:  Radiographs of January 01, 2016. FINDINGS: There is no evidence of hip fracture or dislocation. There is no evidence of arthropathy or other focal bone abnormality. IMPRESSION: Normal right hip. Electronically Signed   By: Lupita RaiderJames  Green Jr, M.D.   On: 01/08/2016 18:24      Scheduled Meds: . calcium-vitamin D  1 tablet Oral Daily  . cefTRIAXone (ROCEPHIN)  IV  1 g Intravenous QHS  . DERMA-SMOOTHE/FS BODY  1 application Topical Daily  . diazepam  10 mg Oral TID  . fluticasone  2 spray Each Nare BID  . heparin  5,000 Units Subcutaneous Q8H  . mometasone-formoterol  2 puff Inhalation BID  . oxyCODONE-acetaminophen  1 tablet Oral QID  . pantoprazole  80 mg Oral Daily  . polyethylene glycol  17 g Oral Daily  . prednisoLONE acetate  1 drop Both Eyes Daily  . sertraline  200 mg Oral Daily  . tamsulosin  0.4 mg Oral QPC breakfast   Continuous Infusions: . sodium chloride 1,000 mL (01/09/16 1227)     LOS: 0 days    Time spent in minutes: 35    Rocklyn Mayberry, MD [facility] Pager: [email or web address] Password [email or web address] 01/09/2016, 3:06 PM

## 2016-01-09 NOTE — Progress Notes (Signed)
Called 6 east for assistance/instruction with back brace.

## 2016-01-10 ENCOUNTER — Inpatient Hospital Stay (HOSPITAL_COMMUNITY): Payer: Medicaid Other

## 2016-01-10 LAB — BASIC METABOLIC PANEL
ANION GAP: 4 — AB (ref 5–15)
BUN: 21 mg/dL — AB (ref 6–20)
CHLORIDE: 111 mmol/L (ref 101–111)
CO2: 24 mmol/L (ref 22–32)
Calcium: 8.3 mg/dL — ABNORMAL LOW (ref 8.9–10.3)
Creatinine, Ser: 1.06 mg/dL (ref 0.61–1.24)
GFR calc Af Amer: >60 mL/min (ref >60)
GFR calc non Af Amer: >60 mL/min (ref >60)
GLUCOSE: 113 mg/dL — AB (ref 65–99)
POTASSIUM: 3.5 mmol/L (ref 3.5–5.1)
Sodium: 139 mmol/L (ref 135–145)

## 2016-01-10 LAB — URINE CULTURE

## 2016-01-10 LAB — CBC
HEMATOCRIT: 32.6 % — AB (ref 39.0–52.0)
HEMOGLOBIN: 11.4 g/dL — AB (ref 13.0–17.0)
MCH: 32.5 pg (ref 26.0–34.0)
MCHC: 35 g/dL (ref 30.0–36.0)
MCV: 92.9 fL (ref 78.0–100.0)
Platelets: 191 10*3/uL (ref 150–400)
RBC: 3.51 MIL/uL — ABNORMAL LOW (ref 4.22–5.81)
RDW: 13.5 % (ref 11.5–15.5)
WBC: 5.7 10*3/uL (ref 4.0–10.5)

## 2016-01-10 MED ORDER — SORBITOL 70 % SOLN
960.0000 mL | TOPICAL_OIL | Freq: Once | ORAL | Status: AC
  Administered 2016-01-10: 960 mL via RECTAL
  Filled 2016-01-10: qty 240

## 2016-01-10 MED ORDER — ENSURE ENLIVE PO LIQD
237.0000 mL | Freq: Three times a day (TID) | ORAL | Status: DC
  Administered 2016-01-10 – 2016-01-11 (×4): 237 mL via ORAL

## 2016-01-10 MED ORDER — BISACODYL 10 MG RE SUPP: 10.0000 mg | Freq: Once | RECTAL | Status: DC

## 2016-01-10 MED ORDER — IBUPROFEN 200 MG PO TABS: 400.0000 mg | ORAL_TABLET | Freq: Four times a day (QID) | ORAL | Status: DC | PRN

## 2016-01-10 MED ORDER — BISACODYL 5 MG PO TBEC
5.0000 mg | DELAYED_RELEASE_TABLET | Freq: Once | ORAL | Status: AC
  Administered 2016-01-10: 5 mg via ORAL
  Filled 2016-01-10: qty 1

## 2016-01-10 NOTE — Progress Notes (Signed)
Initial Nutrition Assessment  DOCUMENTATION CODES:   Severe malnutrition in context of acute illness/injury  INTERVENTION:   Provide Ensure Enlive po TID, each supplement provides 350 kcal and 20 grams of protein Encourage PO intake RD to continue to monitor  NUTRITION DIAGNOSIS:   Malnutrition related to acute illness as evidenced by moderate depletion of body fat, moderate depletions of muscle mass.  GOAL:   Patient will meet greater than or equal to 90% of their needs  MONITOR:   PO intake, Supplement acceptance, Labs, Weight trends, I & O's  REASON FOR ASSESSMENT:   Consult Poor PO  ASSESSMENT:   65 y.o. male with medical history significant of nephrolithiasis, blindness, BPH, COPD.  Patient presents to the ED with c/o back pain.  Pain is located in center of back, has been increasing these past 2 weeks.  Onset after a ground level fall.  2 days ago he was seen in ED for abd pain which was due to urinary retention (has had this 2x previously as well), foley placed and he felt better.  He reports poor PO fluid intake over past 2 days.  Patient with legal blindness. Pt not forthcoming with information when questioned. Pt reports continued poor appetite. PO intake documentation shows pt has been consuming 75-100% of multiple meals. Pt with reported poor PO intake x 2 days PTA. He states he receives Ensure supplements monthly at home. RD to order Ensure.  Weight has remained stable per history documentation. Nutrition-Focused physical exam completed. Findings are moderate fat depletion, moderate muscle depletion, and no edema.   Labs reviewed. Medications: OSCAL-D tablet daily, Miralax packet daily  Diet Order:  Diet Heart Room service appropriate? Yes; Fluid consistency: Thin  Skin:  Reviewed, no issues  Last BM:  8/23  Height:   Ht Readings from Last 1 Encounters:  01/08/16 5\' 9"  (1.753 m)    Weight:   Wt Readings from Last 1 Encounters:  01/08/16 146 lb 2.6 oz  (66.3 kg)    Ideal Body Weight:  72.7 kg  BMI:  Body mass index is 21.58 kg/m.  Estimated Nutritional Needs:   Kcal:  1650-1850  Protein:  75-85g  Fluid:  1.8L/day  EDUCATION NEEDS:   No education needs identified at this time  Tilda FrancoLindsey Kyler Germer, MS, RD, LDN Pager: [phone] After Hours Pager: [phone]

## 2016-01-10 NOTE — Progress Notes (Signed)
OT Cancellation Note  Patient Details Name: [name] [name] MRN: [id number] DOB: [birth year]   Cancelled Treatment:    Reason Eval/Treat Not Completed: Other (comment) Pt states pain currently 7/10 and reports not sleeping well last night. He requests to work with therapy after getting some rest. Will reattempt as able later today.  Lennox LaityStone, [name]  [phone] 01/10/2016, 10:36 AM

## 2016-01-10 NOTE — Progress Notes (Signed)
PT Cancellation Note  Patient Details Name: [name] [name] MRN: [id number] DOB: 10-Oct-1950   Cancelled Treatment:    Reason Eval/Treat Not Completed: Fatigue/lethargy limiting ability to participate;Patient declined,states that he did not sleep.Strongly encouraged patient to get OOB and ambulate.   Rada HayHill, Toriana Sponsel Elizabeth 01/10/2016, 1:58 PM Blanchard KelchKaren Lajoy [place] PT [phone]

## 2016-01-10 NOTE — Care Management Note (Signed)
Case Management Note  Patient Details  Name: [name] [name] MRN: [id number] Date of Birth: Sep 03, 1950  Subjective/Objective:    65 yo admitted with Catheter associated urinary tract infection.                Action/Plan: Pt from home with friend and has aide through his Medicaid. HHPT was recommendation from PT eval. Pt has Medicaid and does not have a qualifying diagnosis to receive HHPT at discharge. Pt would be able to receive a HHRN for safety eval. [facility] and [facility] and [facility]. [facility] rep contacted for referral. [facility]. CM will continue to follow.  Expected Discharge Date:  01/11/16               Expected Discharge Plan:  Home w Home Health Services  In-House Referral:     Discharge planning Services     Post Acute Care Choice:  Home Health Choice offered to:  Patient  DME Arranged:    DME Agency:  NA  HH Arranged:  RN HH Agency:  [facility]  Status of Service:     If discussed at Long Length of Stay Meetings, dates discussed:    Additional CommentsBartholome Bill:  Natonya Finstad H, RN 01/10/2016, 1:04 PM  [phone]

## 2016-01-10 NOTE — Progress Notes (Signed)
PROGRESS NOTE    [name] [name]  XBJ:[id number] DOB: 1950/10/20 DOA: 01/08/2016  PCP: Gwynneth AlimentSANDERS,ROBYN N, MD   Brief Narrative:  [name] [name] is a 65 y.o. male with medical history significant of nephrolithiasis, blindness, BPH, COPD.  Patient presents to the ED with c/o back pain.  Pain is located in center of back, has been increasing these past 2 weeks.  Onset after a ground level fall.  2 days ago he was seen in ED for abd pain which was due to urinary retention (has had this 2x previously as well), foley placed and he felt better.  He reports poor PO fluid intake over past 2 days.  Subjective: Back pain is much better. Severe itching. No BM yet. No vomiting or abdominal pain. No cough or dyspnea.   Assessment & Plan:   Principal Problem:   Back pain with Closed compression fracture of L3 lumbar vertebra  -  has 50% height loss- on exam, most of his pain is lateral to the spine (bilateral) with no point tenderness over lumbar spine which suggests that this is not an acute fracture- he is unbale to tolerate an MRI and states he will need sedation for it - pain improving with medications and therefore no intervention needed for fracture at this time - TLSO brace ordered  - also has 2 prior compression fractures in T11 and T12 - Pt eval- [facility] - has day time caretaker at home    Catheter-associated urinary tract infection  - UA positive but this is in setting of Foley cath - f/u culture show < 10,000 colonies- will d/c antibiotics     Urinary retention/ BPH - cont foley cath for now- retention likely due to pain and possibly also narcotics- has had retention in relation to surgery in the past- already on Flomax - appt with Urology made for next week    AKI (acute kidney injury) - prerenal? Due to poor PO intake in past few days- improved today- cont IVF- follow oral intake   DVT prophylaxis: heparin Code Status: Full code Family Communication:     Disposition Plan: home when stable Consultants:   none Procedures:   none Antimicrobials:  Anti-infectives    Start     Dose/Rate Route Frequency Ordered Stop   01/08/16 2215  cefTRIAXone (ROCEPHIN) 1 g in dextrose 5 % 50 mL IVPB     1 g 100 mL/hr over 30 Minutes Intravenous Daily at bedtime 01/08/16 2045     01/08/16 2015  aztreonam (AZACTAM) 1 g in dextrose 5 % 50 mL IVPB  Status:  Discontinued     1 g 100 mL/hr over 30 Minutes Intravenous  Once 01/08/16 2006 01/08/16 2141       Objective: Vitals:   01/09/16 2040 01/10/16 0645 01/10/16 0942 01/10/16 1314  BP:  120/84  104/71  Pulse:  (!) 52  68  Resp:  16  15  Temp:  97.8 F (36.6 C)  97.1 F (36.2 C)  TempSrc:  Oral  Oral  SpO2: 94% 96% 97% 98%  Weight:      Height:        Intake/Output Summary (Last 24 hours) at 01/10/16 1416 Last data filed at 01/10/16 1315  Gross per 24 hour  Intake             1380 ml  Output              601 ml  Net  779 ml   Filed Weights   01/08/16 1441 01/08/16 2300  Weight: 68 kg (150 lb) 66.3 kg (146 lb 2.6 oz)    Examination: General exam: Appears comfortable  HEENT: PERRLA, oral mucosa moist, no sclera icterus or thrush Respiratory system: Clear to auscultation. Respiratory effort normal. Cardiovascular system: S1 & S2 heard, RRR.  No murmurs  Gastrointestinal system: Abdomen soft, non-tender, nondistended. Normal bowel sound. No organomegaly Central nervous system: Alert and oriented. No focal neurological deficits.- no sensory or motor deficits  Extremities: No cyanosis, clubbing or edema MSK: tender in b/l lumbar area / flanks Skin: No rashes or ulcers Psychiatry:  Mood & affect appropriate.     Data Reviewed: I have personally reviewed following labs and imaging studies  CBC:  Recent Labs Lab 01/07/16 0020 01/08/16 1528 01/10/16 0413  WBC 10.3 12.3* 5.7  NEUTROABS 8.0* 9.7*  --   HGB 15.4 16.0 11.4*  HCT 43.2 46.5 32.6*  MCV 90.2 91.7 92.9   PLT 301 418* 191   Basic Metabolic Panel:  Recent Labs Lab 01/06/16 2029 01/08/16 1528 01/08/16 2206 01/09/16 0353 01/10/16 0413  NA 142 139  --  140 139  K 3.6 3.2*  --  3.3* 3.5  CL 109 103  --  110 111  CO2 22 24  --  25 24  GLUCOSE 113* 120*  --  114* 113*  BUN 15 26*  --  31* 21*  CREATININE 1.13 2.13* 2.00* 1.63* 1.06  CALCIUM 9.8 9.9  --  8.5* 8.3*   GFR: Estimated Creatinine Clearance: 66 mL/min (by C-G formula based on SCr of 1.06 mg/dL). Liver Function Tests:  Recent Labs Lab 01/08/16 1528  AST 29  ALT 32  ALKPHOS 119  BILITOT 1.2  PROT 8.5*  ALBUMIN 4.8   No results for input(s): LIPASE, AMYLASE in the last 168 hours. No results for input(s): AMMONIA in the last 168 hours. Coagulation Profile: No results for input(s): INR, PROTIME in the last 168 hours. Cardiac Enzymes:  Recent Labs Lab 01/08/16 1558  CKTOTAL 106   BNP (last 3 results) No results for input(s): PROBNP in the last 8760 hours. HbA1C: No results for input(s): HGBA1C in the last 72 hours. CBG: No results for input(s): GLUCAP in the last 168 hours. Lipid Profile: No results for input(s): CHOL, HDL, LDLCALC, TRIG, CHOLHDL, LDLDIRECT in the last 72 hours. Thyroid Function Tests: No results for input(s): TSH, T4TOTAL, FREET4, T3FREE, THYROIDAB in the last 72 hours. Anemia Panel: No results for input(s): VITAMINB12, FOLATE, FERRITIN, TIBC, IRON, RETICCTPCT in the last 72 hours. Urine analysis:    Component Value Date/Time   COLORURINE AMBER (A) 01/08/2016 1905   APPEARANCEUR TURBID (A) 01/08/2016 1905   LABSPEC 1.027 01/08/2016 1905   PHURINE 5.0 01/08/2016 1905   GLUCOSEU NEGATIVE 01/08/2016 1905   HGBUR LARGE (A) 01/08/2016 1905   BILIRUBINUR MODERATE (A) 01/08/2016 1905   KETONESUR NEGATIVE 01/08/2016 1905   PROTEINUR 100 (A) 01/08/2016 1905   UROBILINOGEN 1.0 06/08/2014 1306   NITRITE POSITIVE (A) 01/08/2016 1905   LEUKOCYTESUR MODERATE (A) 01/08/2016 1905   Sepsis  Labs: @LABRCNTIP (procalcitonin:4,lacticidven:4) ) Recent Results (from the past 240 hour(s))  Urine culture     Status: Abnormal   Collection Time: 01/08/16  7:05 PM  Result Value Ref Range Status   Specimen Description URINE, CATHETERIZED  Final   Special Requests NONE  Final   Culture (A)  Final    <10,000 COLONIES/mL INSIGNIFICANT GROWTH Performed at [facility]  Report Status 01/10/2016 FINAL  Final         Radiology Studies: Dg Lumbar Spine Complete  Result Date: 01/08/2016 CLINICAL DATA:  Low back pain and right hip pain. EXAM: LUMBAR SPINE - COMPLETE 4+ VIEW COMPARISON:  CT abdomen/ pelvis 06/22/2015 FINDINGS: Compression fracture of L3 with approximately 50% height loss is new compared to 06/22/15. Compression fracture at L1 and compression deformities of T11 and T12 are unchanged. There is no listhesis. Multiple mildly dilated loops of small bowel are seen within the abdomen. IMPRESSION: 1. Age indeterminate compression fracture of the L3 vertebral body with approximately 50% height loss, new compared to 06/22/15. 2. Mildly dilated loops of small bowel seen within the abdomen. Correlate clinically for signs of obstruction. Electronically Signed   By: Deatra RobinsonKevin  Herman M.D.   On: 01/08/2016 18:26   Dg Hip Unilat W Or Wo Pelvis 2-3 Views Right  Result Date: 01/08/2016 CLINICAL DATA:  Acute right hip pain without known injury. EXAM: DG HIP (WITH OR WITHOUT PELVIS) 2-3V RIGHT COMPARISON:  Radiographs of January 01, 2016. FINDINGS: There is no evidence of hip fracture or dislocation. There is no evidence of arthropathy or other focal bone abnormality. IMPRESSION: Normal right hip. Electronically Signed   By: Lupita RaiderJames  Green Jr, M.D.   On: 01/08/2016 18:24      Scheduled Meds: . calcium-vitamin D  1 tablet Oral Daily  . cefTRIAXone (ROCEPHIN)  IV  1 g Intravenous QHS  . DERMA-SMOOTHE/FS BODY  1 application Topical Daily  . diazepam  10 mg Oral TID  . feeding supplement (ENSURE  ENLIVE)  237 mL Oral TID BM  . fluticasone  2 spray Each Nare BID  . heparin  5,000 Units Subcutaneous Q8H  . mometasone-formoterol  2 puff Inhalation BID  . pantoprazole  80 mg Oral Daily  . polyethylene glycol  17 g Oral Daily  . prednisoLONE acetate  1 drop Both Eyes Daily  . sertraline  200 mg Oral Daily  . tamsulosin  0.4 mg Oral QPC breakfast   Continuous Infusions: . sodium chloride 1,000 mL (01/10/16 0311)     LOS: 1 day    Time spent in minutes: 35    Izyk Marty, MD [facility] Pager: [email or web address] Password [email or web address] 01/10/2016, 2:16 PM

## 2016-01-11 ENCOUNTER — Inpatient Hospital Stay (HOSPITAL_COMMUNITY): Payer: Medicaid Other

## 2016-01-11 DIAGNOSIS — M545 Low back pain: Secondary | ICD-10-CM

## 2016-01-11 LAB — BASIC METABOLIC PANEL
Anion gap: 4 — ABNORMAL LOW (ref 5–15)
BUN: 19 mg/dL (ref 6–20)
CALCIUM: 8.5 mg/dL — AB (ref 8.9–10.3)
CHLORIDE: 107 mmol/L (ref 101–111)
CO2: 28 mmol/L (ref 22–32)
CREATININE: 1.19 mg/dL (ref 0.61–1.24)
GFR calc Af Amer: >60 mL/min (ref >60)
Glucose, Bld: 127 mg/dL — ABNORMAL HIGH (ref 65–99)
Potassium: 3.8 mmol/L (ref 3.5–5.1)
SODIUM: 139 mmol/L (ref 135–145)

## 2016-01-11 MED ORDER — SORBITOL 70 % SOLN
30.0000 mL | Status: AC
  Administered 2016-01-11: 30 mL via ORAL
  Filled 2016-01-11: qty 30

## 2016-01-11 MED ORDER — POLYETHYLENE GLYCOL 3350 17 G PO PACK: 17.0000 g | PACK | Freq: Two times a day (BID) | ORAL | 0 refills | Status: DC | PRN

## 2016-01-11 MED ORDER — BISACODYL 10 MG RE SUPP: 10.0000 mg | RECTAL | 0 refills | Status: DC | PRN

## 2016-01-11 MED ORDER — DICLOFENAC SODIUM 1 % TD GEL
2.0000 g | Freq: Four times a day (QID) | TRANSDERMAL | Status: DC
  Administered 2016-01-11: 2 g via TOPICAL
  Filled 2016-01-11 (×2): qty 100

## 2016-01-11 NOTE — Progress Notes (Signed)
CSW consulted for SNF placement. CSW met with pt to assist with d/c planning. Pt reports that he was recently in placement. " I want to go home." Pt is declining SNF placement at this time. RNCM alerted. CSW will remain available to assist with SNF placement if pt changes his mind and will accept placement.  Werner Lean LCSW [phone]

## 2016-01-11 NOTE — Progress Notes (Signed)
SSE administered with good results . Patienit had no problems with the procedure

## 2016-01-11 NOTE — Progress Notes (Signed)
PROGRESS NOTE    [name] [name]  WUJ:[id number] DOB: 21-Apr-1951 DOA: 01/08/2016  PCP: Gwynneth Aliment, MD   Brief Narrative:  [name] [name] is a 65 y.o. male with medical history significant of nephrolithiasis, blindness, BPH, COPD.  Patient presents to the ED with c/o back pain.  Pain is located in center of back, has been increasing these past 2 weeks.  Onset after a ground level fall.  2 days ago he was seen in ED for abd pain which was due to urinary retention (has had this 2x previously as well), foley placed and he felt better.  He reports poor PO fluid intake over past 2 days.  Subjective: Back pain is improving. Per RN, he is having a great deal of difficulty getting out of bed on his own today.   Assessment & Plan:   Principal Problem:   Back pain with Closed compression fracture of L3 lumbar vertebra  -  has 50% height loss- on exam, most of his pain is lateral to the spine (bilateral) with no point tenderness over lumbar spine which suggests that this is not an acute fracture- he is unbale to tolerate an MRI and states he will need sedation for it - pain improving with medications and therefore no intervention needed for fracture at this time - TLSO brace ordered  - also has 2 prior compression fractures in T11 and T12 - Pt eval- likely can go home with [facility] PT however, apparently he is having increasing trouble with ambulation today- will have PT re-eval today- has day time caretaker at home which is present for only a few hrs a day.     Catheter-associated urinary tract infection  - UA positive but this is in setting of Foley cath -U culture show < 10,000 colonies- will d/c antibiotics     Urinary retention/ BPH - cont foley cath for now- retention likely due to pain and possibly also narcotics- has had retention in relation to surgery in the past- already on Flomax - appt with Urology made for next week  Constipation - has resolved with enemas and laxatives-    AKI (acute kidney injury) - prerenal? Due to poor PO intake in past few days- improved with IV hydration   DVT prophylaxis: heparin Code Status: Full code Family Communication:   Disposition Plan: home when stable Consultants:   none Procedures:   none Antimicrobials:  Anti-infectives    Start     Dose/Rate Route Frequency Ordered Stop   01/08/16 2215  cefTRIAXone (ROCEPHIN) 1 g in dextrose 5 % 50 mL IVPB  Status:  Discontinued     1 g 100 mL/hr over 30 Minutes Intravenous Daily at bedtime 01/08/16 2045 01/10/16 1418   01/08/16 2015  aztreonam (AZACTAM) 1 g in dextrose 5 % 50 mL IVPB  Status:  Discontinued     1 g 100 mL/hr over 30 Minutes Intravenous  Once 01/08/16 2006 01/08/16 2141       Objective: Vitals:   01/10/16 2200 01/11/16 0520 01/11/16 0918 01/11/16 1336  BP: 128/89 (!) 145/87  (!) 154/87  Pulse: 68 68  73  Resp: 15 14  16   Temp: 97.8 F (36.6 C) 98.1 F (36.7 C)  98.1 F (36.7 C)  TempSrc: Oral Oral  Oral  SpO2: 99% 96% 98% 98%  Weight:      Height:        Intake/Output Summary (Last 24 hours) at 01/11/16 1342 Last data filed at 01/11/16 1245  Gross  per 24 hour  Intake             1620 ml  Output             3301 ml  Net            -1681 ml   Filed Weights   01/08/16 1441 01/08/16 2300  Weight: 68 kg (150 lb) 66.3 kg (146 lb 2.6 oz)    Examination: General exam: Appears comfortable  HEENT: PERRLA, oral mucosa moist, no sclera icterus or thrush Respiratory system: Clear to auscultation. Respiratory effort normal. Cardiovascular system: S1 & S2 heard, RRR.  No murmurs  Gastrointestinal system: Abdomen soft, non-tender, nondistended. Normal bowel sound. No organomegaly Central nervous system: Alert and oriented. No focal neurological deficits.- no sensory or motor deficits - Extremities: No cyanosis, clubbing or edema MSK: tender in b/l lumbar area / flanks Skin: No rashes or ulcers Psychiatry:  Mood & affect appropriate.     Data  Reviewed: I have personally reviewed following labs and imaging studies  CBC:  Recent Labs Lab 01/07/16 0020 01/08/16 1528 01/10/16 0413  WBC 10.3 12.3* 5.7  NEUTROABS 8.0* 9.7*  --   HGB 15.4 16.0 11.4*  HCT 43.2 46.5 32.6*  MCV 90.2 91.7 92.9  PLT 301 418* 191   Basic Metabolic Panel:  Recent Labs Lab 01/06/16 2029 01/08/16 1528 01/08/16 2206 01/09/16 0353 01/10/16 0413 01/11/16 0337  NA 142 139  --  140 139 139  K 3.6 3.2*  --  3.3* 3.5 3.8  CL 109 103  --  110 111 107  CO2 22 24  --  25 24 28   GLUCOSE 113* 120*  --  114* 113* 127*  BUN 15 26*  --  31* 21* 19  CREATININE 1.13 2.13* 2.00* 1.63* 1.06 1.19  CALCIUM 9.8 9.9  --  8.5* 8.3* 8.5*   GFR: Estimated Creatinine Clearance: 58.8 mL/min (by C-G formula based on SCr of 1.19 mg/dL). Liver Function Tests:  Recent Labs Lab 01/08/16 1528  AST 29  ALT 32  ALKPHOS 119  BILITOT 1.2  PROT 8.5*  ALBUMIN 4.8   No results for input(s): LIPASE, AMYLASE in the last 168 hours. No results for input(s): AMMONIA in the last 168 hours. Coagulation Profile: No results for input(s): INR, PROTIME in the last 168 hours. Cardiac Enzymes:  Recent Labs Lab 01/08/16 1558  CKTOTAL 106   BNP (last 3 results) No results for input(s): PROBNP in the last 8760 hours. HbA1C: No results for input(s): HGBA1C in the last 72 hours. CBG: No results for input(s): GLUCAP in the last 168 hours. Lipid Profile: No results for input(s): CHOL, HDL, LDLCALC, TRIG, CHOLHDL, LDLDIRECT in the last 72 hours. Thyroid Function Tests: No results for input(s): TSH, T4TOTAL, FREET4, T3FREE, THYROIDAB in the last 72 hours. Anemia Panel: No results for input(s): VITAMINB12, FOLATE, FERRITIN, TIBC, IRON, RETICCTPCT in the last 72 hours. Urine analysis:    Component Value Date/Time   COLORURINE AMBER (A) 01/08/2016 1905   APPEARANCEUR TURBID (A) 01/08/2016 1905   LABSPEC 1.027 01/08/2016 1905   PHURINE 5.0 01/08/2016 1905   GLUCOSEU NEGATIVE  01/08/2016 1905   HGBUR LARGE (A) 01/08/2016 1905   BILIRUBINUR MODERATE (A) 01/08/2016 1905   KETONESUR NEGATIVE 01/08/2016 1905   PROTEINUR 100 (A) 01/08/2016 1905   UROBILINOGEN 1.0 06/08/2014 1306   NITRITE POSITIVE (A) 01/08/2016 1905   LEUKOCYTESUR MODERATE (A) 01/08/2016 1905   Sepsis Labs: @LABRCNTIP (procalcitonin:4,lacticidven:4) ) Recent Results (from the past  240 hour(s))  Urine culture     Status: Abnormal   Collection Time: 01/08/16  7:05 PM  Result Value Ref Range Status   Specimen Description URINE, CATHETERIZED  Final   Special Requests NONE  Final   Culture (A)  Final    <10,000 COLONIES/mL INSIGNIFICANT GROWTH Performed at [facility]    Report Status 01/10/2016 FINAL  Final         Radiology Studies: Dg Abd Portable 1v  Result Date: 01/11/2016 CLINICAL DATA:  Two days of mid abdominal pain ; history of pancreatitis, ulcerative colitis, urinary tract stones. EXAM: PORTABLE ABDOMEN - 1 VIEW COMPARISON:  KUB of January 10, 2016 FINDINGS: The colonic stool burden remains increased. There is a moderate amount of small and large bowel gas without obstructive appearing pattern. No definite urinary tract stones are observed. The bony structures exhibit no acute abnormalities. There is calcification in the wall of the abdominal aorta. IMPRESSION: Persistent increase colonic stool burden consistent with constipation. No evidence of obstruction or ileus nor other acute intra-abdominal abnormality. Electronically Signed   By: David  [place] M.D.   On: 01/11/2016 09:31   Dg Abd Portable 1v  Result Date: 01/10/2016 CLINICAL DATA:  Constipation and abdominal pain. EXAM: PORTABLE ABDOMEN - 1 VIEW COMPARISON:  CT of the abdomen pelvis 06/22/2015 FINDINGS: Nonobstructive bowel gas pattern. Large amount of formed stool throughout the colon consistent with constipation. Osseus structures are grossly unremarkable. No evidence of organomegaly. IMPRESSION: Nonobstructive bowel  gas pattern. Constipation. Electronically Signed   By: Ted Mcalpine M.D.   On: 01/10/2016 14:44      Scheduled Meds: . bisacodyl  10 mg Rectal Once  . calcium-vitamin D  1 tablet Oral Daily  . DERMA-SMOOTHE/FS BODY  1 application Topical Daily  . diazepam  10 mg Oral TID  . diclofenac sodium  2 g Topical QID  . feeding supplement (ENSURE ENLIVE)  237 mL Oral TID BM  . fluticasone  2 spray Each Nare BID  . heparin  5,000 Units Subcutaneous Q8H  . mometasone-formoterol  2 puff Inhalation BID  . pantoprazole  80 mg Oral Daily  . polyethylene glycol  17 g Oral Daily  . prednisoLONE acetate  1 drop Both Eyes Daily  . sertraline  200 mg Oral Daily  . tamsulosin  0.4 mg Oral QPC breakfast   Continuous Infusions: . sodium chloride 75 mL/hr at 01/10/16 1503     LOS: 2 days    Time spent in minutes: 35    Kaylyn Garrow, MD [facility] Pager: [email or web address] Password [email or web address] 01/11/2016, 1:42 PM

## 2016-01-11 NOTE — Progress Notes (Signed)
Nursing Note; Pt had medium,formed stool after SMOG enema.[facility] after medium stool.wbb

## 2016-01-11 NOTE — Progress Notes (Signed)
Physical Therapy Treatment Patient Details Name: [name] [name] Flaming MRN: [id number] DOB: 17-Apr-1951 Today's Date: 01/11/2016    History of Present Illness [name] [name] Bezek is a 65 y.o. male came to ED 01/08/16 with medical history significant of nephrolithiasis, blindness, BPH, COPD.  Patient presents to the ED with c/o back pain.  Pain is located in center of back, has been increasing these past 2 weeks.  Onset after a ground level fall  2 days PTA,he was seen in ED for abd pain which was due to urinary retention (has had this 2x previously as well), foley placed and he felt better. Has H/O multiple RUE surgeries and nerve impairment.    PT Comments    The paient states that he  Can call [facility] and get 24 hour caregivers. Asked if  He could call  A caseworker and he said that the person was on vacation. At this time, patient is unable to manage his brace and his balance is impaired  With more difficulty ambulating today using the RW which is difficult with RUE dysfunction. Continue PT while in acute care.   Follow Up Recommendations  SNF;Supervision/Assistance - 24 hour     Equipment Recommendations  None recommended by PT    Recommendations for Other Services       Precautions / Restrictions Precautions Precautions: Fall Precaution Comments: legally blind Required Braces or Orthoses: Spinal Brace;Other Brace/Splint Spinal Brace: Thoracolumbosacral orthotic Other Brace/Splint: R wrist splint:  sx 7/19 with Dr Mina MarbleWeingold for finger/wrist lengthening and median nerve neurolysis    Mobility  Bed Mobility   Bed Mobility: Supine to Sit Rolling: Supervision         General bed mobility comments: cues to log roll but patient did not perform, in sitting assisted with donning TLSDO with moderate assist due to R hand dysfunction and visual deficits.  Transfers Overall transfer level: Needs assistance Equipment used: Rolling walker (2 wheeled) Transfers: Sit to/from Stand Sit to Stand:  Min guard         General transfer comment: stands with extra effort to rise and steady, no assist provided by PT, patient held onto RW  Ambulation/Gait Ambulation/Gait assistance: Min assist Ambulation Distance (Feet): 100 Feet Assistive device: Rolling walker (2 wheeled) Gait Pattern/deviations: Step-through pattern;Shuffle     General Gait Details: patient declined to wear R wrist splint stating"I don't need that". Patient ambulated with the RW with decreased ability to  maintain grip with the right hand, at times PT had to push the right side of the RW to keep it straight. The patient is taking shuffle steps.  The patient states that he has a walker at home but does not know if it has wheels. the patient reports that he uses his cane inside.    Stairs            Wheelchair Mobility    Modified Rankin (Stroke Patients Only)       Balance Overall balance assessment: Needs assistance;History of Falls         Standing balance support: During functional activity;No upper extremity supported Standing balance-Leahy Scale: Poor                      Cognition Arousal/Alertness: Awake/alert Behavior During Therapy: WFL for tasks assessed/performed Overall Cognitive Status: Within Functional Limits for tasks assessed                      Exercises  General Comments        Pertinent Vitals/Pain Pain Score: 6  Pain Location: back, R hand Pain Descriptors / Indicators: Aching;Discomfort    Home Living                      Prior Function            PT Goals (current goals can now be found in the care plan section) Progress towards PT goals: Progressing toward goals    Frequency  Min 3X/week    PT Plan Discharge plan needs to be updated    Co-evaluation             End of Session Equipment Utilized During Treatment: Gait belt Activity Tolerance: Patient tolerated treatment well Patient left: in bed;with call  bell/phone within reach;with bed alarm set     Time: 1356-1419 PT Time Calculation (min) (ACUTE ONLY): 23 min  Charges:  $Gait Training: 23-37 mins                    G Codes:      Rada Hay 01/11/2016, 3:46 PM Blanchard Kelch PT [phone]

## 2016-02-04 DIAGNOSIS — M545 Low back pain, unspecified: Secondary | ICD-10-CM

## 2016-02-04 NOTE — Discharge Summary (Signed)
Physician Discharge Summary  [name] [name] YNW:[id number] DOB: 06/05/1950 DOA: 01/08/2016  PCP: [name] [name]  Admit date: 01/08/2016 Discharge date: 02/04/2016  Admitted From: home Disposition:  home   Recommendations for Outpatient Follow-up:  1. Voiding trial  Home Health:  yes  Equipment/Devices:  none    Discharge Condition:  stable   CODE STATUS:  Full code   Diet recommendation:  Heart healthy Consultations:  none    Discharge Diagnoses:  Principal Problem:   Lumbar back pain Active Problems:   Closed compression fracture of L3 lumbar vertebra (HCC)   Urinary retention   AKI (acute kidney injury) (HCC)    Subjective: Feels weak. Back pain is minimal today. No dyspnea, cough, nausea, vomiting, diarrhea or constipation.   Brief Summary: [name] [name] a 65 y.o.malewith medical history significant of nephrolithiasis, blindness, BPH, COPD. Patient presents to the ED with c/o back pain. Pain is located in center of back, has been increasing these past 2 weeks. Onset after a ground level fall. 2 days ago he was seen in ED for abd pain which was due to urinary retention (has had this 2x previously as well), foley placed and he felt better. He reports poor PO fluid intake over past 2 days.  Hospital Course:  Principal Problem:   Back pain with Closed compression fracture of L3 lumbar vertebra  -  has 50% height loss- on exam, most of his pain is lateral to the spine (bilateral) with no point tenderness over lumbar spine which suggests that this is not an acute fracture- he is unbale to tolerate an MRI and states he will need sedation for it - pain improving with medications and therefore no intervention needed for fracture at this time - TLSO brace ordered  - also has 2 prior compression fractures in T11 and T12 - per repeat PT eval today, he is recommended to go to SNF but he has declined this-  has day time caretaker at home which is present for  only a few hrs a day however, he has arranged for her to be more available to care for him- HHPT ordered    Catheter-associated urinary tract infection  - UA positive but this is in setting of Foley cath -U culture show < 10,000 colonies-  d/c'd antibiotics     Urinary retention/ BPH - cont foley cath for now- retention likely due to pain and possibly also narcotics- has had retention in relation to surgery in the past- already on Flomax - appt with Urology made for next week  Constipation - has resolved with enemas and laxatives-     AKI (acute kidney injury) - prerenal- Due to poor PO intake in past few days- improved with IV hydration    Discharge Instructions  Discharge Instructions    Diet - low sodium heart healthy    Complete by:  As directed    Increase activity slowly    Complete by:  As directed        Medication List    TAKE these medications   albuterol 108 (90 Base) MCG/ACT inhaler Commonly known as:  PROVENTIL HFA;VENTOLIN HFA Inhale 2 puffs into the lungs every 6 (six) hours as needed for wheezing or shortness of breath.   albuterol (2.5 MG/3ML) 0.083% nebulizer solution Commonly known as:  PROVENTIL Take 3 mLs (2.5 mg total) by nebulization every 4 (four) hours as needed for wheezing or shortness of breath.   bisacodyl 10 MG suppository Commonly known as:  DULCOLAX Place 1 suppository (10 mg total) rectally as needed for moderate constipation.   CALCIUM 600+D 600-400 MG-UNIT tablet Generic drug:  Calcium Carbonate-Vitamin D Take 1 tablet by mouth daily.   clobetasol cream 0.05 % Commonly known as:  TEMOVATE Apply 1 application topically 2 (two) times daily.   DERMA-SMOOTHE/FS BODY 0.01 % Oil Apply 1 application topically daily. Pt applies to scalp.   diazepam 10 MG tablet Commonly known as:  VALIUM Take 1 tablet (10 mg total) by mouth 3 (three) times daily.   DULERA 200-5 MCG/ACT Aero Generic drug:  mometasone-formoterol Inhale 2 puffs  into the lungs 2 (two) times daily.   EPIPEN 2-PAK 0.3 mg/0.3 mL Soaj injection Generic drug:  EPINEPHrine Inject 0.3 mg into the muscle once as needed (for severe allergic reaction).   fluticasone 50 MCG/ACT nasal spray Commonly known as:  FLONASE Place 2 sprays into both nostrils 2 (two) times daily.   hydrOXYzine 25 MG tablet Commonly known as:  ATARAX/VISTARIL Take 25 mg by mouth 3 (three) times daily as needed for itching.   omeprazole 40 MG capsule Commonly known as:  PRILOSEC Take 40 mg by mouth daily as needed (for acid reflux).   polyethylene glycol packet Commonly known as:  MIRALAX / GLYCOLAX Take 17 g by mouth 2 (two) times daily as needed. What changed:  when to take this  reasons to take this   prednisoLONE acetate 1 % ophthalmic suspension Commonly known as:  PRED FORTE Place 1 drop into both eyes daily.   sertraline 100 MG tablet Commonly known as:  ZOLOFT Take 2 tablets (200 mg total) by mouth daily.   tamsulosin 0.4 MG Caps capsule Commonly known as:  FLOMAX Take 0.4 mg by mouth daily after breakfast.   temazepam 15 MG capsule Commonly known as:  RESTORIL Take 15 mg by mouth at bedtime as needed for sleep.      Follow-up Information    [facility].   Why:  Next Wed the Aug 30th 0915am. With Anne Fu NP. Contact information: [address] [place] [address] [phone]       [facility] .   Contact information: [address] [place] [address] [phone]          Allergies  Allergen Reactions  . Bacitracin-Polymyxin B Other (See Comments)    Pt states that his ophthalmologist told him he was allergic to this.     . Bee Venom Anaphylaxis  . Codeine Anaphylaxis  . Norco [Hydrocodone-Acetaminophen] Other (See Comments)    Reaction:  Headaches   . Penicillins Anaphylaxis and Other (See Comments)    Tolerated numerous cephalosporins in past.  .  Shellfish-Derived Products Anaphylaxis  . Other Hives and Other (See Comments)    Pt states that he is allergic to dust, pollen, and mold.   . Tape Hives  . Chlorhexidine Itching and Rash  . Sulfate Itching and Rash     Procedures/Studies:   Dg Lumbar Spine Complete  Result Date: 01/08/2016 CLINICAL DATA:  Low back pain and right hip pain. EXAM: LUMBAR SPINE - COMPLETE 4+ VIEW COMPARISON:  CT abdomen/ pelvis 06/22/2015 FINDINGS: Compression fracture of L3 with approximately 50% height loss is new compared to 06/22/15. Compression fracture at L1 and compression deformities of T11 and T12 are unchanged. There is no listhesis. Multiple mildly dilated loops of small bowel are seen within the abdomen. IMPRESSION: 1. Age indeterminate compression fracture of the L3 vertebral  body with approximately 50% height loss, new compared to 06/22/15. 2. Mildly dilated loops of small bowel seen within the abdomen. Correlate clinically for signs of obstruction. Electronically Signed   By: Deatra Robinson M.D.   On: 01/08/2016 18:26   Dg Abd Portable 1v  Result Date: 01/11/2016 CLINICAL DATA:  Two days of mid abdominal pain ; history of pancreatitis, ulcerative colitis, urinary tract stones. EXAM: PORTABLE ABDOMEN - 1 VIEW COMPARISON:  KUB of January 10, 2016 FINDINGS: The colonic stool burden remains increased. There is a moderate amount of small and large bowel gas without obstructive appearing pattern. No definite urinary tract stones are observed. The bony structures exhibit no acute abnormalities. There is calcification in the wall of the abdominal aorta. IMPRESSION: Persistent increase colonic stool burden consistent with constipation. No evidence of obstruction or ileus nor other acute intra-abdominal abnormality. Electronically Signed   By: David  [place] M.D.   On: 01/11/2016 09:31   Dg Abd Portable 1v  Result Date: 01/10/2016 CLINICAL DATA:  Constipation and abdominal pain. EXAM: PORTABLE ABDOMEN - 1 VIEW  COMPARISON:  CT of the abdomen pelvis 06/22/2015 FINDINGS: Nonobstructive bowel gas pattern. Large amount of formed stool throughout the colon consistent with constipation. Osseus structures are grossly unremarkable. No evidence of organomegaly. IMPRESSION: Nonobstructive bowel gas pattern. Constipation. Electronically Signed   By: Ted Mcalpine M.D.   On: 01/10/2016 14:44   Dg Hip Unilat W Or Wo Pelvis 2-3 Views Right  Result Date: 01/08/2016 CLINICAL DATA:  Acute right hip pain without known injury. EXAM: DG HIP (WITH OR WITHOUT PELVIS) 2-3V RIGHT COMPARISON:  Radiographs of January 01, 2016. FINDINGS: There is no evidence of hip fracture or dislocation. There is no evidence of arthropathy or other focal bone abnormality. IMPRESSION: Normal right hip. Electronically Signed   By: Lupita Raider, M.D.   On: 01/08/2016 18:24       Discharge Exam: Vitals:   01/11/16 0520 01/11/16 1336  BP: (!) 145/87 (!) 154/87  Pulse: 68 73  Resp: 14 16  Temp: 98.1 F (36.7 C) 98.1 F (36.7 C)   Vitals:   01/10/16 2200 01/11/16 0520 01/11/16 0918 01/11/16 1336  BP: 128/89 (!) 145/87  (!) 154/87  Pulse: 68 68  73  Resp: 15 14  16   Temp: 97.8 F (36.6 C) 98.1 F (36.7 C)  98.1 F (36.7 C)  TempSrc: Oral Oral  Oral  SpO2: 99% 96% 98% 98%  Weight:      Height:        General: Pt is alert, awake, not in acute distress Cardiovascular: RRR, S1/S2 +, no rubs, no gallops Respiratory: CTA bilaterally, no wheezing, no rhonchi Abdominal: Soft, NT, ND, bowel sounds + Extremities: no edema, no cyanosis    The results of significant diagnostics from this hospitalization (including imaging, microbiology, ancillary and laboratory) are listed below for reference.     Microbiology: No results found for this or any previous visit (from the past 240 hour(s)).   Labs: BNP (last 3 results) No results for input(s): BNP in the last 8760 hours. Basic Metabolic Panel: No results for input(s): NA, K, CL,  CO2, GLUCOSE, BUN, CREATININE, CALCIUM, MG, PHOS in the last 168 hours. Liver Function Tests: No results for input(s): AST, ALT, ALKPHOS, BILITOT, PROT, ALBUMIN in the last 168 hours. No results for input(s): LIPASE, AMYLASE in the last 168 hours. No results for input(s): AMMONIA in the last 168 hours. CBC: No results for input(s): WBC, NEUTROABS, HGB, HCT,  MCV, PLT in the last 168 hours. Cardiac Enzymes: No results for input(s): CKTOTAL, CKMB, CKMBINDEX, TROPONINI in the last 168 hours. BNP: Invalid input(s): POCBNP CBG: No results for input(s): GLUCAP in the last 168 hours. D-Dimer No results for input(s): DDIMER in the last 72 hours. Hgb A1c No results for input(s): HGBA1C in the last 72 hours. Lipid Profile No results for input(s): CHOL, HDL, LDLCALC, TRIG, CHOLHDL, LDLDIRECT in the last 72 hours. Thyroid function studies No results for input(s): TSH, T4TOTAL, T3FREE, THYROIDAB in the last 72 hours.  Invalid input(s): FREET3 Anemia work up No results for input(s): VITAMINB12, FOLATE, FERRITIN, TIBC, IRON, RETICCTPCT in the last 72 hours. Urinalysis    Component Value Date/Time   COLORURINE AMBER (A) 01/08/2016 1905   APPEARANCEUR TURBID (A) 01/08/2016 1905   LABSPEC 1.027 01/08/2016 1905   PHURINE 5.0 01/08/2016 1905   GLUCOSEU NEGATIVE 01/08/2016 1905   HGBUR LARGE (A) 01/08/2016 1905   BILIRUBINUR MODERATE (A) 01/08/2016 1905   KETONESUR NEGATIVE 01/08/2016 1905   PROTEINUR 100 (A) 01/08/2016 1905   UROBILINOGEN 1.0 06/08/2014 1306   NITRITE POSITIVE (A) 01/08/2016 1905   LEUKOCYTESUR MODERATE (A) 01/08/2016 1905   Sepsis Labs Invalid input(s): PROCALCITONIN,  WBC,  LACTICIDVEN Microbiology No results found for this or any previous visit (from the past 240 hour(s)).   Time coordinating discharge: Over 30 minutes  SIGNED:   Calvert Cantor, [name]  [facility] 02/04/2016, 9:21 AM Pager   If 7PM-7AM, please contact night-coverage [email or web address] Password  [email or web address]

## 2016-02-26 ENCOUNTER — Other Ambulatory Visit: Payer: Self-pay | Admitting: Pulmonary Disease

## 2016-03-08 ENCOUNTER — Other Ambulatory Visit: Payer: Self-pay | Admitting: Internal Medicine

## 2016-03-11 ENCOUNTER — Other Ambulatory Visit: Payer: Self-pay | Admitting: Pulmonary Disease

## 2016-03-19 ENCOUNTER — Other Ambulatory Visit: Payer: Self-pay | Admitting: Internal Medicine

## 2016-04-06 ENCOUNTER — Other Ambulatory Visit: Payer: Self-pay | Admitting: Internal Medicine

## 2016-04-14 ENCOUNTER — Telehealth: Payer: Self-pay | Admitting: Pulmonary Disease

## 2016-04-14 MED ORDER — MOMETASONE FURO-FORMOTEROL FUM 200-5 MCG/ACT IN AERO: 2.0000 | INHALATION_SPRAY | Freq: Two times a day (BID) | RESPIRATORY_TRACT | 0 refills | Status: DC

## 2016-04-14 NOTE — Telephone Encounter (Signed)
Pt is aware that Rx for 1 month has [facility] and aware that appt is needed with PM; his caregiver will be back tomorrow(could not give me name or number for caregiver at this time-requests we call back tomorrow at 10am to speak with them to see about scheduling OV this Thursday or Friday with PM.

## 2016-04-22 NOTE — Telephone Encounter (Signed)
Will need to call the pts home around 10 to talk to the caregiver to schedule this appt.

## 2016-04-25 NOTE — Telephone Encounter (Signed)
Spoke with patient, his caregiver is not at home with him right now.  Pt states that we will need to call back in the morning on Monday to schedule with the caregiver. Will hold in triage to be follow up on 04/28/16

## 2016-04-29 ENCOUNTER — Other Ambulatory Visit: Payer: Self-pay | Admitting: Pulmonary Disease

## 2016-04-29 NOTE — Telephone Encounter (Signed)
lmtcb X1 for pt to schedule appointment.

## 2016-05-02 NOTE — Telephone Encounter (Signed)
Attempted to contact the pt but there was no VM to leave a message. Will need to try back later.

## 2016-05-05 NOTE — Telephone Encounter (Signed)
Attempted to contact pt. No answer, no option to leave a message. Will try back.  

## 2016-05-08 NOTE — Telephone Encounter (Signed)
Pt is unable of remember the number of his caregiver(Margaret) and does not know when he will see her again. Pt advised that when he was seen in Feb 2017 he was told to follow up in 6 months which should have been scheduled for August. Pt has not appts on the books. I advised that I will mail him a letter telling him what is needed and will address it to him and Claris CheMargaret to help him remember to have her call us.  Letter placed in mail. Will close this message.  Will send to Dr Isaiah SergeMannam to make aware.

## 2016-06-03 ENCOUNTER — Other Ambulatory Visit: Payer: Self-pay | Admitting: Pulmonary Disease

## 2016-08-09 ENCOUNTER — Other Ambulatory Visit: Payer: Self-pay | Admitting: Pulmonary Disease

## 2016-08-13 ENCOUNTER — Telehealth: Payer: Self-pay | Admitting: Pulmonary Disease

## 2016-08-13 MED ORDER — ALBUTEROL SULFATE HFA 108 (90 BASE) MCG/ACT IN AERS: 2.0000 | INHALATION_SPRAY | Freq: Four times a day (QID) | RESPIRATORY_TRACT | 0 refills | Status: DC | PRN

## 2016-08-13 NOTE — Telephone Encounter (Signed)
Rx sent to preferred pharmacy. Pt aware that apt is needed for further refills. Pt states he will give us a call back to schedule apt, as he has to go thru transportation. Nothing further needed at this time.

## 2016-11-04 ENCOUNTER — Emergency Department (HOSPITAL_COMMUNITY)
Admission: EM | Admit: 2016-11-04 | Discharge: 2016-11-05 | Disposition: A | Discharge status: Home / Self Care | Payer: Medicare Other | Attending: Emergency Medicine | Admitting: Emergency Medicine

## 2016-11-04 ENCOUNTER — Encounter (HOSPITAL_COMMUNITY): Payer: Self-pay | Admitting: *Deleted

## 2016-11-04 DIAGNOSIS — Z7902 Long term (current) use of antithrombotics/antiplatelets: Secondary | ICD-10-CM | POA: Insufficient documentation

## 2016-11-04 DIAGNOSIS — Z87891 Personal history of nicotine dependence: Secondary | ICD-10-CM | POA: Insufficient documentation

## 2016-11-04 DIAGNOSIS — Z79899 Other long term (current) drug therapy: Secondary | ICD-10-CM | POA: Insufficient documentation

## 2016-11-04 DIAGNOSIS — J449 Chronic obstructive pulmonary disease, unspecified: Secondary | ICD-10-CM | POA: Insufficient documentation

## 2016-11-04 DIAGNOSIS — I10 Essential (primary) hypertension: Secondary | ICD-10-CM | POA: Insufficient documentation

## 2016-11-04 DIAGNOSIS — Z022 Encounter for examination for admission to residential institution: Secondary | ICD-10-CM | POA: Diagnosis not present

## 2016-11-04 DIAGNOSIS — R627 Adult failure to thrive: Secondary | ICD-10-CM | POA: Diagnosis not present

## 2016-11-04 DIAGNOSIS — J45909 Unspecified asthma, uncomplicated: Secondary | ICD-10-CM | POA: Insufficient documentation

## 2016-11-04 DIAGNOSIS — Z659 Problem related to unspecified psychosocial circumstances: Secondary | ICD-10-CM

## 2016-11-04 LAB — BASIC METABOLIC PANEL
Anion gap: 7 (ref 5–15)
BUN: 22 mg/dL — AB (ref 6–20)
CHLORIDE: 112 mmol/L — AB (ref 101–111)
CO2: 24 mmol/L (ref 22–32)
Calcium: 9.4 mg/dL (ref 8.9–10.3)
Creatinine, Ser: 1.07 mg/dL (ref 0.61–1.24)
GFR calc Af Amer: >60 mL/min (ref >60)
GFR calc non Af Amer: >60 mL/min (ref >60)
Glucose, Bld: 159 mg/dL — ABNORMAL HIGH (ref 65–99)
POTASSIUM: 3.5 mmol/L (ref 3.5–5.1)
SODIUM: 143 mmol/L (ref 135–145)

## 2016-11-04 NOTE — Progress Notes (Signed)
CSW updated provider that pt is refusing placement and will be seen by DSS APS Social Work Caseworker at his home tomorrow for assistance with placement.  CSW can provide pt with food and water when pt is D/C'd.  Please reconsult if future social work needs arise.    Dorothe PeaJonathan F. Kelita Wallis, Theresia MajorsLCSWA, LCAS Clinical Social Worker Ph: [phone]

## 2016-11-04 NOTE — ED Triage Notes (Signed)
Patient is alert and oriented x4  He is being seen due to a neighbor calling EMS due to the patient living in a home with no electricity or water.  Currently the patient has no complaints.

## 2016-11-04 NOTE — Progress Notes (Signed)
CSW staffed with [facility] Asst Director and since pt has voiced his wishes that he not be placed and that he return home, rather than allow the CSW to assist with placement, pt is appropriate to return home.  Per provider pt is alert and oriented and per chart pt does not have a legal guardian, which means pt has the right to return home if he so wishes, rather than be placed.  Per pt's "Bill of Rights" pt has the right to self-determination.  Per APS (see earlier notes), DSS will arrive at the pt's home on 11/05/16 to attempt assist the pt with placement options if he so chooses and as such will be checking on the pt's welfare at that time.    [name] PeaJonathan F. Alberta [name], [name] MajorsLCSWA, LCAS Clinical Social Worker Ph: [phone]

## 2016-11-04 NOTE — ED Notes (Signed)
Gave Pt a ham sandwich and 2 cokes.

## 2016-11-04 NOTE — ED Notes (Signed)
Gave Pt a second sandwich and his 3rd coke.

## 2016-11-04 NOTE — ED Notes (Signed)
Bed: [id number] Expected date:  Expected time:  Means of arrival:  Comments: EMS-FTT

## 2016-11-04 NOTE — Progress Notes (Signed)
CSW contacted the [facility] to see if they were offering a "heat-relief shelter" and their facility is closed. CS[facility] to see if pt can seek shelter there for one night and the facility stated [facility] not [facility] staffing, nor are they equipped and trained to take care of a blind person and as such are refusing admittance.    [name] PeaJonathan F. Magdalyn Arenivas, Theresia MajorsLCSWA, LCAS Clinical Social Worker Ph: [phone]

## 2016-11-04 NOTE — Progress Notes (Signed)
CSW spoke to provider and provider informed CSW provider spoke to pt and informed pt he is medically cleared for D/C and pt repeatedly refused to leave.  Provider counseled pt on accepting assistance from the DSS caseworker Arnetha CourserCarol Wright [phone] who is assisting the pt with ALF placement if pt is agreeable tomorrow. CSW called afterhours APS agent back and informed her and she will email [name] and inform her pt will need to be seen at the ED On 6/20 before he is D/C'd. CSW informed APS worker pt is refusing to leave tonight but will be D/C'd tomorrow and will be D/C'd after DSS interviews and assists pt at that time.   CSW called DSS caseworker Arnetha CourserCarol Wright [phone] and left a message on her confidential voicemail to call the daytime ED CSW to seek assistance with interviewing the pt before he is D/C'd tomorrow for help with ALF placement.  [name] PeaJonathan F. Glenmore [name], [name] MajorsLCSWA, LCAS Clinical Social Worker Ph: [phone]

## 2016-11-04 NOTE — Progress Notes (Addendum)
Consult request has been received. CSW attempting to follow up at present time. CSW met with pt and confirmed pt is completely blind and has no running water and electricity.  Per pt, arrived to the pt's house on the morning of 6/19 and informed the pt, "We are working on it" referring to working on solutions to the pt's problems.  In addition, per pt, he has an aide whom was to live with the pt once pt confirmed with DSS the aide was to the pt's liking.  Per pt, the aide has not been "approved yet" and pt is without an aide today and has been for an undetermined amount of time.  Pt gave CSW permission to contact DSS on his behalf.  CSW asked pt if he is willing to be placed into another facility and be given resources and pt stated not he has lived in his home for 12 years and has no plans to leave and refused assistance from the [facility] or DSS in being placed in either case.    Per pt, he has had bedbugs for the past two days.  Per pt, they just recently "showed up".  Alphonse Guild. Idamae Coccia, Latanya Presser, LCAS Clinical Social Worker Ph: [phone]

## 2016-11-04 NOTE — ED Provider Notes (Signed)
[facility]-EMERGENCY DEPT Provider Note   CSN: [id number] Arrival date & time: 11/04/16  1207     History   Chief Complaint Chief Complaint  Patient presents with  . Failure To Thrive    HPI [name] [name] is a 66 y.o. male with a PMHx of eczema, COPD, chronic lower back pain, BPH, HepC, HTN, legal blindness s/p b/l eye enucleations, and ulcerative colitis, who presents to the ED with complaints of poor living situation. Patient lives at home by himself and for the last 2-3 days he has not had any electricity or running water in his home. He states that someone from social services came by today and told him that they were working on it, however he cannot recall the name of the individual came by. He previously had a live-in aide until January 06, 2018when his aide passed away, and he hasn't gotten a replacement yet. He he does have an aide that comes by occasionally to help take him places and get his medications, however they don't live with him. Last night he slept at a neighbors house, but he does not have any family here that can help him. He states that in the last 2 days he has only had about 2 meals, and reports that he feels hungry. He has been staying as hydrated as possible by drinking "a lot" of water, and is requesting a soft drink. He has no complaints at this time aside from being hungry, and denies any lightheadedness, fevers, chills, CP, SOB, abd pain, N/V/D/C, hematuria, dysuria, myalgias, arthralgias, numbness, tingling, focal weakness, or any other complaints at this time. He states he has been able to take his medications as directed during this time, but left the house today without them. He is on disability but denies having a Child psychotherapist or Retail buyer.   The history is provided by the patient and medical records. No language interpreter was used.    Past Medical History:  Diagnosis Date  . Anxiety   . Arthritis    spine  . Chronic lower back pain   . Contact  dermatitis and other eczema, due to unspecified cause   . COPD (chronic obstructive pulmonary disease) (HCC)   . Cough   . Depression    patient constantly hitting left side of face "due to pain"  . Enlarged prostate   . Esophageal reflux   . H/O hiatal hernia   . Headache    PMH: migraines  . Hep C w/o coma, chronic (HCC) early 2013  . History of eye prosthesis   . History of kidney stones   . Hypertension    not on medication at this time  . 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 Multiple facial bone fractures (HCC)   . Multiple open wounds    "from esczema- scratching"  . Osteopenia   . Pancreatitis   . Pneumonia    in past had several times  . Seasonal allergies   . Ulcerative colitis   . Unspecified asthma(493.90)   . Unspecified sinusitis (chronic)     Patient Active Problem List   Diagnosis Date Noted  . Lumbar back pain 02/04/2016  . AKI (acute kidney injury) (HCC) 01/08/2016  . Closed compression fracture of L3 lumbar vertebra (HCC) 01/08/2016  . Pruritus 10/01/2015  . Acute maxillary sinusitis 06/03/2014  . Severe asthma   . Acute on chronic respiratory failure (HCC) 05/31/2014  . Chronic cough 05/31/2014  .  Asthma with acute exacerbation 05/31/2014  . Chronic pain syndrome 05/31/2014  . Malnutrition of moderate degree (HCC) 05/30/2014  . Hypokalemia 05/28/2014  . SOB (shortness of breath) 05/28/2014  . Weakness of back 02/03/2014  . Weakness of both legs 02/03/2014  . Gait instability 12/01/2013  . Allergic contact dermatitis 12/24/2011  . 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 Wrist joint infection (HCC) 10/10/2011    Class: Diagnosis of  . Urinary retention 09/16/2011  . 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 TACHYCARDIA 07/16/2010  . BLINDNESS 10/27/2008  . Sinusitis, chronic 08/03/2007  . Allergic rhinitis 08/03/2007  . Asthma 08/03/2007  . Esophageal reflux 08/03/2007  . ECZEMA 08/03/2007  . OSTEOPENIA 08/03/2007  .  ULCERATIVE COLITIS, HX OF 08/03/2007    Past Surgical History:  Procedure Laterality Date  . CARPAL TUNNEL RELEASE Right 06/15/2013   Procedure: RIGHT CARPAL TUNNEL RELEASE;  Surgeon: Marlowe Shores, MD;  Location: [facility];  Service: Orthopedics;  Laterality: Right;  . COLONOSCOPY W/ BIOPSIES AND POLYPECTOMY    . ENUCLEATION Left   . EYE SURGERY     3 Corneal transplant (bilateral eyes).    . FRACTURE SURGERY    . HARDWARE REMOVAL  10/09/2011   Procedure: HARDWARE REMOVAL;  Surgeon: Nadara Mustard, MD;  Location: [facility] OR;  Service: Orthopedics;  Laterality: Right;  Removal Deep Hardware Right Wrist, placement antibiotic beads.  . INGUINAL HERNIA REPAIR Left    age 25  . Litrotripsy    . metacarpal pinning Right    right hand  . NERVE, TENDON AND ARTERY REPAIR Right 12/05/2015   Procedure: RIGHT WRIST, HAND FLEXOR TENDON LENGTHENING;  Surgeon: Dairl Ponder, MD;  Location: [facility] OR;  Service: Orthopedics;  Laterality: Right;  . NEUROLYSIS OF MEDIAN NERVE Right 12/05/2015   Procedure: RIGHT MEDIAN NERVE NEUROLYSIS ;  Surgeon: Dairl Ponder, MD;  Location: [facility] OR;  Service: Orthopedics;  Laterality: Right;  . ORIF DISTAL RADIUS FRACTURE  09/12/11   right  . ORIF WRIST FRACTURE Right 06/15/2013   Procedure: RIGHT RADIUS PSTEOTOMY, DISTAL ULNA RESECTION, AND DIGITAL FLEXOR LENGTHENING AS NEEDED;  Surgeon: Marlowe Shores, MD;  Location: [facility];  Service: Orthopedics;  Laterality: Right;  . PATELLA FRACTURE SURGERY     right  . RADIOLOGY WITH ANESTHESIA N/A 03/09/2014   Procedure: MRI - Cervical and Lumbar without Contrast;  Surgeon: Medication Radiologist, MD;  Location: [facility] OR;  Service: Radiology;  Laterality: N/A;  . RADIOLOGY WITH ANESTHESIA N/A 04/03/2015   Procedure: MRI OF BRAIN    (RADIOLOGY WITH ANESTHESIA);  Surgeon: Medication Radiologist, MD;  Location: [facility] OR;  Service: Radiology;  Laterality: N/A;  . TONSILLECTOMY AND ADENOIDECTOMY          Home Medications    Prior to Admission medications   Medication Sig Start Date End Date Taking? Authorizing Provider  albuterol (PROVENTIL HFA;VENTOLIN HFA) 108 (90 Base) MCG/ACT inhaler Inhale 2 puffs into the lungs every 6 (six) hours as needed for wheezing or shortness of breath. 08/13/16   Mannam, Praveen, MD  albuterol (PROVENTIL) (2.5 MG/3ML) 0.083% nebulizer solution Take 3 mLs (2.5 mg total) by nebulization every 4 (four) hours as needed for wheezing or shortness of breath. 04/17/15   Parrett, Virgel Bouquet, NP  bisacodyl (DULCOLAX) 10 MG suppository Place 1 suppository (10 mg total) rectally as needed for moderate constipation. 01/11/16   Calvert Cantor, MD  Calcium Carbonate-Vitamin D (CALCIUM 600+D) 600-400 MG-UNIT tablet Take 1 tablet by mouth daily.  [provider]  clobetasol cream (TEMOVATE) 0.05 % Apply 1 application topically 2 (two) times daily.    [provider]  diazepam (VALIUM) 10 MG tablet Take 1 tablet (10 mg total) by mouth 3 (three) times daily. 06/06/14   Osvaldo ShipperKrishnan, Gokul, MD  DULERA 200-5 MCG/ACT AERO INHALE 2 PUFFS INTO THE LUNGS TWICE A DAY 04/15/16   Mannam, Praveen, MD  EPINEPHrine (EPIPEN 2-PAK) 0.3 mg/0.3 mL IJ SOAJ injection Inject 0.3 mg into the muscle once as needed (for severe allergic reaction).    [provider]  Fluocinolone Acetonide (DERMA-SMOOTHE/FS BODY) 0.01 % OIL Apply 1 application topically daily. Pt applies to scalp.    [provider]  fluticasone (FLONASE) 50 MCG/ACT nasal spray Place 2 sprays into both nostrils 2 (two) times daily.    [provider]  fluticasone (FLONASE) 50 MCG/ACT nasal spray USE 2 SPRAYS IN EACH NOSTRIL TWICE DAILY. 05/08/16   Mannam, Colbert CoyerPraveen, MD  hydrOXYzine (ATARAX/VISTARIL) 25 MG tablet Take 25 mg by mouth 3 (three) times daily as needed for itching.    [provider]  mometasone-formoterol (DULERA) 200-5 MCG/ACT AERO 2 puffs inhaled twice daily. Needs office  visit for future refills. 06/03/16   Mannam, Colbert CoyerPraveen, MD  omeprazole (PRILOSEC) 40 MG capsule Take 40 mg by mouth daily as needed (for acid reflux).     [provider]  polyethylene glycol (MIRALAX / GLYCOLAX) packet Take 17 g by mouth 2 (two) times daily as needed. 01/11/16   Calvert Cantorizwan, Saima, MD  prednisoLONE acetate (PRED FORTE) 1 % ophthalmic suspension Place 1 drop into both eyes daily.     [provider]  PROAIR HFA 108 (90 Base) MCG/ACT inhaler INHALE 2 PUFFS INTO THE LUNGS EVERY 6 HOURS AS NEEDED FOR SHORTNESS OF BREATH 02/29/16   Mannam, Praveen, MD  sertraline (ZOLOFT) 100 MG tablet Take 2 tablets (200 mg total) by mouth daily. 06/11/14   Azalia Bilisampos, [name], MD  tamsulosin (FLOMAX) 0.4 MG CAPS capsule Take 0.4 mg by mouth daily after breakfast.     [provider]  temazepam (RESTORIL) 15 MG capsule Take 15 mg by mouth at bedtime as needed for sleep.    [provider]    Family History Family History  Problem Relation Age of Onset  . Allergies Mother        atopic  . Asthma Mother   . Heart failure Mother        CHF  . Other Father   . Anesthesia problems Neg Hx     Social History Social History  Substance Use Topics  . Smoking status: Former Smoker    Packs/day: 1.00    Years: 3.00    Types: Cigarettes, Pipe    Quit date: 05/20/1983  . Smokeless tobacco: Never Used  . Alcohol use Yes     Comment: "scotch once a year"     Allergies   Bacitracin-polymyxin b; Bee venom; Codeine; Norco [hydrocodone-acetaminophen]; Penicillins; Shellfish-derived products; Other; Tape; Chlorhexidine; and Sulfate   Review of Systems Review of Systems  Constitutional: Negative for chills and fever.  Respiratory: Negative for shortness of breath.   Cardiovascular: Negative for chest pain.  Gastrointestinal: Negative for abdominal pain, constipation, diarrhea, nausea and vomiting.  Genitourinary: Negative for dysuria and hematuria.  Musculoskeletal: Negative  for arthralgias and myalgias.  Skin: Negative for color change.  Allergic/Immunologic: Positive for immunocompromised state (HepC).  Neurological: Negative for weakness, light-headedness and numbness.  Psychiatric/Behavioral: Negative for confusion.   All other systems  reviewed and are negative for acute change except as noted in the HPI.    Physical Exam Updated Vital Signs BP 134/81 (BP Location: Left Arm)   Pulse 88   Temp 97.9 F (36.6 C) (Oral)   Resp 20   Ht 5\' 7"  (1.702 m)   Wt 54.4 kg (120 lb)   SpO2 97%   BMI 18.79 kg/m   Physical Exam  Constitutional: He is oriented to person, place, and time. Vital signs are normal. He appears well-developed and well-nourished.  Non-toxic appearance. No distress.  Afebrile, nontoxic, NAD  HENT:  Head: Normocephalic and atraumatic.  Mouth/Throat: Oropharynx is clear and moist and mucous membranes are normal.  MMM  Eyes: Right eye exhibits no discharge. Left eye exhibits no discharge.  B/l eyes enucleated, no drainage from b/l eye sockets  Neck: Normal range of motion. Neck supple.  Cardiovascular: Normal rate, regular rhythm, normal heart sounds and intact distal pulses.  Exam reveals no gallop and no friction rub.   No murmur heard. Pulmonary/Chest: Effort normal and breath sounds normal. No respiratory distress. He has no decreased breath sounds. He has no wheezes. He has no rhonchi. He has no rales.  Abdominal: Soft. Normal appearance and bowel sounds are normal. He exhibits no distension. There is no tenderness. There is no rigidity, no rebound, no guarding, no CVA tenderness, no tenderness at McBurney's point and negative Murphy's sign.  Musculoskeletal: Normal range of motion.  Neurological: He is alert and oriented to person, place, and time. He has normal strength. No sensory deficit.  Skin: Skin is warm, dry and intact. Rash noted.  Eczematous rash with excoriations to face and multiple flexor surfaces of skin, no evidence of  secondary infection  Psychiatric: He has a normal mood and affect.  Nursing note and vitals reviewed.    ED Treatments / Results  Labs (all labs ordered are listed, but only abnormal results are displayed) Labs Reviewed  BASIC METABOLIC PANEL - Abnormal; Notable for the following:       Result Value   Chloride 112 (*)    Glucose, Bld 159 (*)    BUN 22 (*)    All other components within normal limits    EKG  EKG Interpretation None       Radiology No results found.  Procedures Procedures (including critical care time)  Medications Ordered in ED Medications - No data to display   Initial Impression / Assessment and Plan / ED Course  I have reviewed the triage vital signs and the nursing notes.  Pertinent labs & imaging results that were available during my care of the patient were reviewed by me and considered in my medical decision making (see chart for details).     66 y.o. male here with social issue, reports no running water or electricity x2-3 days; reports that social services came by today and said they were working on it, but he can't recall the name of the individual. No longer has a live in aide because his last one died 05/14/2016, has an aide that comes to help him get his meds and get him places but no one lives with him. He's been drinking water to keep hydrated, but has not had much to eat in the last 2 days. His only complaint is feeling hungry and wanting something to drink. Has no family here, slept at a neighbors house yesterday. On exam, eczema rash over face and flexor surfaces, but appears well hydrated, in NAD, b/l eyes  enucleated. Will contact social work, get BMP, and give food/drink. Discussed case with my attending Dr. Freida Busman who agrees with plan.   3:51 PM BMP with mildly elevated gluc 159 (postprandial-- ate 2 sandwiches and 3 cokes so far). Social worker not yet here, prior SW left and advised that the new SW would be here shortly and would take  over the case; will continue to await their recommendations and assistance. Pt comfortable and with no complaints. Will continue to monitor and reassess shortly.   7:40 PM Christiane Ha the Child psychotherapist here to discuss case, long discussion had with him regarding this difficult situation. Apparently it was CPS case worker who called 911 today due to the concerning living situation; unless patient agrees to be placed into a facility or give up his apartment, there isn't much the SW team here can do; and CPS states they're going to see him tomorrow, but I don't feel it's safe for this blind individual to go back to his home with no electricity or water, and the likelihood is that if CPS comes tomorrow they'd still send him back because he'll be in the same living situation. Christiane Ha will staff this case with his director and see what further options we have. Will continue to monitor and reassess periodically.   9:51 PM Christiane Ha the Child psychotherapist back down to discuss case, had said that basically since he's not willing to be placed into ALF, that if he has capacity to decide for himself to go home, then he is allowed to, and that tomorrow CPS will be checking on him and work on getting him evicted for his safety which would essentially force him into placement. After that conversation, pt then became upset stating that he didn't want to go home because of the lack of electricity and water, and refuses to go home, but also refuses to get placed. Will attempt to see if we can work on convincing him to get placed, but unfortunately this is a tricky situation.   10:23 PM Long discussion had with pt regarding the options we face in this difficult situation. Pt finally agreeable to get placed into nursing facility, given lack of viable alternatives, but he states he does not want to go back to the nursing home that he was at before ([place] [address] [place], he isn't sure what the name of it was). He refuses  to go home tonight because he has no electricity or water, and quite frankly this is not a safe alternative anyway. He already contacted friends and can't stay with them. Shelters aren't equipped to take him. But he's finally agreeable for nursing home placement. Called Christiane Ha back and informed him of this change in decision, and he stated that DSS is supposed to come see the pt in the morning, and it will be THEIR job to place him into a facility; however, Christiane Ha will hand off patient case to oncoming SW staff and they will follow up with this situation in the morning to ensure DSS has come to see him and is working on placement. Food tray ordered for pt. At this time, pt is calm and cooperative, has no current needs, and will remain in the ED until the morning when DSS comes to see him to place him into a nursing home facility. Care signed out to [facility] PA-C at time of transfer of care. Pt stable at this time.    Final Clinical Impressions(s) / ED Diagnoses   Final  diagnoses:  Encounter for examination for admission to nursing home  Poor social situation    New Prescriptions New Prescriptions   No medications on [address] [place], [place] 11/04/16 2230    Lorre Nick, MD 11/05/16 [phone]

## 2016-11-04 NOTE — Progress Notes (Addendum)
CSW called DSS to place an APS report.  CSW received a call back from APS who stated they are familiar with the pt and he has an open case woith DSS at this time. Per APS, a DSS agent had visited the pt today, and had called called 911.  Per DSS agent EMS initially refused to transport pt stating there is no medical reason for pt to present to the ED.  Per APS,City has been called to inform them pt has not paid his electricity and water bill since December and due to this the city will likely evict the pt for safety reasons.  Per APS DSS has "lined up" an agency to pay "some of the pt's bills", but not all. APS stated a DSS agent will visit the pt in his home on 6/20 to try and convince the pt to accept help from DSS in placing the pt in an assisted living facility. APS stated pt needs to consider moving into a nursing facility and a DSS worker from DSS will assist the pt in being placed if the pt will agree to be placed.  City has been called by DSS to assess the safety of the pt living in the house he is in because the pt has not paid his water and electricity bills since December. Per APS pt's electricty bill is now over $800  And his water bill is over $100.  Per APS an agency will provide up to $300 but pt will have to pay remaining $400-$500 to have water and power tuned on. Per APS,  city Standard operating procedure is if a person has no running water and electricity the person is evicted in order to ensure no one is living in a potentially unsafe/unsanitary condition.  Per APS Arnetha CourserCarol Wright [phone] is pt's DSS caseworker and has been getting the pt's arrangements made for ALF placement if pt is agreeable tomorrow.   Per APS, DSS is aware pt has bedbugs.    Per APS a DSS social worker is working with the landlord to "get rid of the bed bugs" currently.  Per RN pt resided with a neighbor last night who allowed the pt to stay with the neighbor.Dorothe Pea.  Taylor Levick F. Onofre Gains, Theresia MajorsLCSWA,  LCAS Clinical Social Worker Ph: [phone]

## 2016-11-04 NOTE — Progress Notes (Addendum)
CSW spoke to pt and asked again if pt would like assistance with placement into a nursing home from DSS and pt refused. Pt also refused to return home due to having "no heat, no air and no running water".  CSW stated these are your two choices and pt stated, "I ain't going to no nursing home" and I "can't go home".  CSW stated pt is medically cleared for D/C and cannot stay in the ED and pt repeated, "I can't go home" and became agitated.  Per pt, he has been without electricity for one night.    CSW updated provider who will discuss pt with EDP.  Dorothe PeaJonathan F. Edan Serratore, Theresia MajorsLCSWA, LCAS Clinical Social Worker Ph: [phone]

## 2016-11-05 NOTE — Progress Notes (Addendum)
CSW spoke with patient via bedside regarding discharge plans. Patient responded very little to CSW. Patient scratched his face throughout our conversation. CSW informed patient he had been medically cleared at this time and stable to return home. CSW informed patient [name] [name] with DSS was following up with patient today at home- patient did not respond. Patient did state "your just gonna throw me out in the middle of the night like this? I see how you people are." CSW informed patient it was around 10AM in the morning on 6/20 and he had stayed in the ED all evening. Patient did not reply. CSW updated PA who requested CSW inform DSS worker before setting up PTAR for discharge.   11:47: [facility] [name] [name]. Ms. [name] [name] stated patients APS worker is out of the office for the day however she would pass along the message that patient would be discharged back home. Ms. [name] [name] did not have any questions/ concerns with patient returning home.   PTAR called for transport   [name] GardnerErin Mahina [name], [facility] HospitalCSWA Clinical Social Worker [phone]

## 2016-11-05 NOTE — ED Provider Notes (Signed)
66 year old male with poor social situation signed out to me by J Ward PA-C. Plan is for SW to evaluate in the morning.  11:46 AM SW saw and evaluated pt. He did not discuss his wishes much with her. DSS was contacted who stated that they are providing money to have utilities turned back on and will visit patient tomorrow. Will d/c.   Bethel BornGekas, Mariyah Upshaw Marie, PA-C 11/05/16 1147    Bethel BornGekas, Kaelin Bonelli Marie, PA-C 11/05/16 1148    Gerhard MunchLockwood, Robert, MD 11/05/16 [phone]

## 2016-11-05 NOTE — ED Notes (Signed)
Patient is alert and oriented x3.  He was given DC instructions and follow up visit instructions.  Patient gave verbal understanding.  He was DC ambulatory with cane on a stretcher  to home.  V/S stable.  He was not showing any signs of distress on DC

## 2016-11-05 NOTE — ED Provider Notes (Signed)
Care assumed from previous provider PA Street. Please see note for further details. Case discussed, plan agreed upon. Will discuss case with social work in am as patient is awaiting placement.   Spoke with social work this morning who will be evaluating patient and getting in touch with DSS.  Care assumed by oncoming provider PA Gekas. Case discussed, plan agreed upon. Will follow up on social work recommendations.    Isra Lindy, Chase PicketJaime Pilcher, PA-C 11/05/16 [phone]    Dione BoozeGlick, David, MD 11/05/16 [phone]

## 2016-11-05 NOTE — Progress Notes (Signed)
CSW aware of consult. Attempted to contact [name] Courserarol Wright with DSS- left voicemail for return call. Per notes, patient wishes to return home has is actively involved with DSS- who is to visit with patient today 6/20. CSW spoke with assistant director this AM- who stated patient to return home. Will continue to follow for discharge plans.   Stacy GardnerErin Tyffani Foglesong, LCSWA Clinical Social Worker [phone]

## 2016-11-14 ENCOUNTER — Encounter (HOSPITAL_COMMUNITY): Payer: Self-pay | Admitting: Nurse Practitioner

## 2016-11-14 ENCOUNTER — Emergency Department (HOSPITAL_COMMUNITY)
Admission: EM | Admit: 2016-11-14 | Discharge: 2016-11-15 | Disposition: A | Discharge status: Other Acute Inpatient Hospital | Payer: Medicare Other | Attending: Emergency Medicine | Admitting: Emergency Medicine

## 2016-11-14 DIAGNOSIS — I10 Essential (primary) hypertension: Secondary | ICD-10-CM | POA: Insufficient documentation

## 2016-11-14 DIAGNOSIS — F329 Major depressive disorder, single episode, unspecified: Secondary | ICD-10-CM | POA: Insufficient documentation

## 2016-11-14 DIAGNOSIS — Z79899 Other long term (current) drug therapy: Secondary | ICD-10-CM | POA: Insufficient documentation

## 2016-11-14 DIAGNOSIS — S60511A Abrasion of right hand, initial encounter: Secondary | ICD-10-CM | POA: Insufficient documentation

## 2016-11-14 DIAGNOSIS — Z659 Problem related to unspecified psychosocial circumstances: Secondary | ICD-10-CM

## 2016-11-14 DIAGNOSIS — W1839XA Other fall on same level, initial encounter: Secondary | ICD-10-CM | POA: Insufficient documentation

## 2016-11-14 DIAGNOSIS — Y9389 Activity, other specified: Secondary | ICD-10-CM | POA: Insufficient documentation

## 2016-11-14 DIAGNOSIS — Z742 Need for assistance at home and no other household member able to render care: Secondary | ICD-10-CM | POA: Insufficient documentation

## 2016-11-14 DIAGNOSIS — Y990 Civilian activity done for income or pay: Secondary | ICD-10-CM | POA: Insufficient documentation

## 2016-11-14 DIAGNOSIS — Y9289 Other specified places as the place of occurrence of the external cause: Secondary | ICD-10-CM | POA: Insufficient documentation

## 2016-11-14 DIAGNOSIS — J45909 Unspecified asthma, uncomplicated: Secondary | ICD-10-CM | POA: Insufficient documentation

## 2016-11-14 DIAGNOSIS — L309 Dermatitis, unspecified: Secondary | ICD-10-CM | POA: Insufficient documentation

## 2016-11-14 DIAGNOSIS — R451 Restlessness and agitation: Secondary | ICD-10-CM | POA: Insufficient documentation

## 2016-11-14 DIAGNOSIS — F4325 Adjustment disorder with mixed disturbance of emotions and conduct: Secondary | ICD-10-CM | POA: Insufficient documentation

## 2016-11-14 DIAGNOSIS — H548 Legal blindness, as defined in USA: Secondary | ICD-10-CM | POA: Insufficient documentation

## 2016-11-14 DIAGNOSIS — R45851 Suicidal ideations: Secondary | ICD-10-CM | POA: Insufficient documentation

## 2016-11-14 DIAGNOSIS — Z87891 Personal history of nicotine dependence: Secondary | ICD-10-CM | POA: Insufficient documentation

## 2016-11-14 DIAGNOSIS — J449 Chronic obstructive pulmonary disease, unspecified: Secondary | ICD-10-CM | POA: Insufficient documentation

## 2016-11-14 NOTE — Progress Notes (Addendum)
Consult request has been received. CSW attempting to follow up at present time. CSW requested a "wellness check" by [employer] to determine if the pt's home is condemned as has been reported.   CSW awaiting return call from [employer] to determine if pt's house is actually condemned.  CSW Dept is aware pt has not had running water and electricity in the past, but per notes CSW is aware that [place] [facility] [place] [facility] had stated they were going to assist the pt in paying his water/electricity bills after pt's last D/C on 11/05/16.  CSW is also aware per notes DSS APS has an open case on the pt and have offered pt assistance with obtaining ALF living options which the pt agreed to seek admittance into as of pt's last D/C on 6/20.    Per notes on this admission pt was "brought in by EMS who report they were called to pt's house that was reportedly condemned due to dismal living conditions, no power and running water. Pt is also legally blind and his only complaint at this time is being hungry".   Per, EDP pt is medically cleared for D/C at this time.  Dorothe PeaJonathan F. Stela Iwasaki, Theresia MajorsLCSWA, LCAS Clinical Social Worker Ph: [phone]

## 2016-11-14 NOTE — ED Provider Notes (Signed)
[facility]-EMERGENCY DEPT Provider Note   CSN: [id number] Arrival date & time: 11/14/16  1845     History   Chief Complaint Chief Complaint  Patient presents with  . Needs Social Work    HPI [name] [name] is a 66 y.o. male.  Patients with history of blindness, poor social situation -- presents by EMS. Patient is blind and currently does not have any help at home. He was seen in the emergency department and by social work approximately a week ago. Department of Social Services involved. Per EMS report, today patient was seen by neighbors trying to leave the house. He apparently fell through window. Police were called and patient was brought to the emergency department by EMS. House was reportedly "condemned". There is concern over the patient's living condition and inability to care for himself at the current time. Patient voices no medical complaints except for feeling hungry.      Past Medical History:  Diagnosis Date  . Anxiety   . Arthritis    spine  . Chronic lower back pain   . Contact dermatitis and other eczema, due to unspecified cause   . COPD (chronic obstructive pulmonary disease) (HCC)   . Cough   . Depression    patient constantly hitting left side of face "due to pain"  . Enlarged prostate   . Esophageal reflux   . H/O hiatal hernia   . Headache    PMH: migraines  . Hep C w/o coma, chronic (HCC) early 2013  . History of eye prosthesis   . History of kidney stones   . Hypertension    not on medication at this time  . 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 Multiple facial bone fractures (HCC)   . Multiple open wounds    "from esczema- scratching"  . Osteopenia   . Pancreatitis   . Pneumonia    in past had several times  . Seasonal allergies   . Ulcerative colitis   . Unspecified asthma(493.90)   . Unspecified sinusitis (chronic)     Patient Active Problem List   Diagnosis Date Noted  . Lumbar back pain 02/04/2016  . AKI  (acute kidney injury) (HCC) 01/08/2016  . Closed compression fracture of L3 lumbar vertebra (HCC) 01/08/2016  . Pruritus 10/01/2015  . Acute maxillary sinusitis 06/03/2014  . Severe asthma   . Acute on chronic respiratory failure (HCC) 05/31/2014  . Chronic cough 05/31/2014  . Asthma with acute exacerbation 05/31/2014  . Chronic pain syndrome 05/31/2014  . Malnutrition of moderate degree (HCC) 05/30/2014  . Hypokalemia 05/28/2014  . SOB (shortness of breath) 05/28/2014  . Weakness of back 02/03/2014  . Weakness of both legs 02/03/2014  . Gait instability 12/01/2013  . Allergic contact dermatitis 12/24/2011  . 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 Wrist joint infection (HCC) 10/10/2011    Class: Diagnosis of  . Urinary retention 09/16/2011  . 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 TACHYCARDIA 07/16/2010  . BLINDNESS 10/27/2008  . Sinusitis, chronic 08/03/2007  . Allergic rhinitis 08/03/2007  . Asthma 08/03/2007  . Esophageal reflux 08/03/2007  . ECZEMA 08/03/2007  . OSTEOPENIA 08/03/2007  . ULCERATIVE COLITIS, HX OF 08/03/2007    Past Surgical History:  Procedure Laterality Date  . CARPAL TUNNEL RELEASE Right 06/15/2013   Procedure: RIGHT CARPAL TUNNEL RELEASE;  Surgeon: Marlowe Shores, MD;  Location: [facility];  Service: Orthopedics;  Laterality: Right;  . COLONOSCOPY W/ BIOPSIES AND  POLYPECTOMY    . ENUCLEATION Left   . EYE SURGERY     3 Corneal transplant (bilateral eyes).    . FRACTURE SURGERY    . HARDWARE REMOVAL  10/09/2011   Procedure: HARDWARE REMOVAL;  Surgeon: Nadara Mustard, MD;  [facility] OR;  Service: Orthopedics;  Laterality: Right;  Removal Deep Hardware Right Wrist, placement antibiotic beads.  . INGUINAL HERNIA REPAIR Left    age 75  . Litrotripsy    . metacarpal pinning Right    right hand  . NERVE, TENDON AND ARTERY REPAIR Right 12/05/2015   Procedure: RIGHT WRIST, HAND FLEXOR TENDON LENGTHENING;  Surgeon: Dairl Ponder, MD;   Location: [facility] OR;  Service: Orthopedics;  Laterality: Right;  . NEUROLYSIS OF MEDIAN NERVE Right 12/05/2015   Procedure: RIGHT MEDIAN NERVE NEUROLYSIS ;  Surgeon: Dairl Ponder, MD;  Location: [facility] OR;  Service: Orthopedics;  Laterality: Right;  . ORIF DISTAL RADIUS FRACTURE  09/12/11   right  . ORIF WRIST FRACTURE Right 06/15/2013   Procedure: RIGHT RADIUS PSTEOTOMY, DISTAL ULNA RESECTION, AND DIGITAL FLEXOR LENGTHENING AS NEEDED;  Surgeon: Marlowe Shores, MD;  Location: [facility];  Service: Orthopedics;  Laterality: Right;  . PATELLA FRACTURE SURGERY     right  . RADIOLOGY WITH ANESTHESIA N/A 03/09/2014   Procedure: MRI - Cervical and Lumbar without Contrast;  Surgeon: Medication Radiologist, MD;  Location: [facility] OR;  Service: Radiology;  Laterality: N/A;  . RADIOLOGY WITH ANESTHESIA N/A 04/03/2015   Procedure: MRI OF BRAIN    (RADIOLOGY WITH ANESTHESIA);  Surgeon: Medication Radiologist, MD;  Location: [facility] OR;  Service: Radiology;  Laterality: N/A;  . TONSILLECTOMY AND ADENOIDECTOMY         Home Medications    Prior to Admission medications   Medication Sig Start Date End Date Taking? Authorizing Provider  albuterol (PROVENTIL HFA;VENTOLIN HFA) 108 (90 Base) MCG/ACT inhaler Inhale 2 puffs into the lungs every 6 (six) hours as needed for wheezing or shortness of breath. 08/13/16   Mannam, Praveen, MD  albuterol (PROVENTIL) (2.5 MG/3ML) 0.083% nebulizer solution Take 3 mLs (2.5 mg total) by nebulization every 4 (four) hours as needed for wheezing or shortness of breath. 04/17/15   Parrett, Virgel Bouquet, NP  bisacodyl (DULCOLAX) 10 MG suppository Place 1 suppository (10 mg total) rectally as needed for moderate constipation. 01/11/16   Calvert Cantor, MD  diazepam (VALIUM) 10 MG tablet Take 1 tablet (10 mg total) by mouth 3 (three) times daily. 06/06/14   Osvaldo Shipper, MD  DULERA 200-5 MCG/ACT AERO INHALE 2 PUFFS INTO THE LUNGS TWICE A DAY Patient not taking: Reported on 11/04/2016  04/15/16   Chilton Greathouse, MD  EPINEPHrine (EPIPEN 2-PAK) 0.3 mg/0.3 mL IJ SOAJ injection Inject 0.3 mg into the muscle once as needed (for severe allergic reaction).    [provider]  fluticasone (FLONASE) 50 MCG/ACT nasal spray USE 2 SPRAYS IN EACH NOSTRIL TWICE DAILY. 05/08/16   Mannam, Colbert Coyer, MD  mometasone-formoterol (DULERA) 200-5 MCG/ACT AERO 2 puffs inhaled twice daily. Needs office visit for future refills. Patient taking differently: Inhale 2 puffs into the lungs 2 (two) times daily as needed for wheezing or shortness of breath.  06/03/16   Mannam, Colbert Coyer, MD  PROAIR HFA 108 (90 Base) MCG/ACT inhaler INHALE 2 PUFFS INTO THE LUNGS EVERY 6 HOURS AS NEEDED FOR SHORTNESS OF BREATH 02/29/16   Chilton Greathouse, MD    Family History Family History  Problem Relation Age of Onset  . Allergies  Mother        atopic  . Asthma Mother   . Heart failure Mother        CHF  . Other Father   . Anesthesia problems Neg Hx     Social History Social History  Substance Use Topics  . Smoking status: Former Smoker    Packs/day: 1.00    Years: 3.00    Types: Cigarettes, Pipe    Quit date: 05/20/1983  . Smokeless tobacco: Never Used  . Alcohol use Yes     Comment: "scotch once a year"     Allergies   Bacitracin-polymyxin b; Bee venom; Codeine; Norco [hydrocodone-acetaminophen]; Penicillins; Shellfish-derived products; Other; Tape; Chlorhexidine; and Sulfate   Review of Systems Review of Systems  Constitutional: Negative for fever.  HENT: Negative for rhinorrhea and sore throat.   Eyes: Negative for redness.  Respiratory: Negative for cough.   Cardiovascular: Negative for chest pain.  Gastrointestinal: Negative for abdominal pain, diarrhea, nausea and vomiting.  Genitourinary: Negative for dysuria.  Musculoskeletal: Negative for myalgias.  Skin: Negative for rash.  Neurological: Negative for headaches.     Physical Exam Updated Vital Signs BP 113/79 (BP Location: Left  Arm)   Pulse 96   Temp 98.2 F (36.8 C) (Oral)   Resp 16   SpO2 98%   Physical Exam  Constitutional: He appears well-developed and well-nourished.  HENT:  Head: Normocephalic and atraumatic.  Mouth/Throat: Oropharynx is clear and moist.  Eyes: Right eye exhibits no discharge. Left eye exhibits no discharge.  Pt with previous enucleation bilaterally.   Neck: Normal range of motion. Neck supple.  Cardiovascular: Normal rate, regular rhythm and normal heart sounds.   Pulmonary/Chest: Effort normal and breath sounds normal.  Abdominal: Soft. There is no tenderness.  Musculoskeletal: He exhibits no tenderness.  No external signs of trauma.   Neurological: He is alert.  Skin: Skin is warm and dry.  Psychiatric: He has a normal mood and affect.  Nursing note and vitals reviewed.    ED Treatments / Results   Procedures Procedures (including critical care time)  Medications Ordered in ED Medications - No data to display   Initial Impression / Assessment and Plan / ED Course  I have reviewed the triage vital signs and the nursing notes.  Pertinent labs & imaging results that were available during my care of the patient were reviewed by me and considered in my medical decision making (see chart for details).     Patient seen and examined. Patient without complaint, no signs of trauma. He is medically cleared from my standpoint. No acute psychiatric issues suspected.   Vital signs reviewed and are as follows: BP 113/79 (BP Location: Left Arm)   Pulse 96   Temp 98.2 F (36.8 C) (Oral)   Resp 16   SpO2 98%   Social work aware and is working on case.   11:06 PM SW has found patient a place to go.   Will be discharged at midnight.   Final Clinical Impressions(s) / ED Diagnoses   Final diagnoses:  Social problem   No medical complaints. Pt to be d/c with plan to get meds, housing.   New Prescriptions New Prescriptions   No medications on file     Renne CriglerGeiple, Daneesha Quinteros,  Cordelia Poche-C 11/14/16 2312    Vanetta MuldersZackowski, Scott, MD 11/16/16 1655

## 2016-11-14 NOTE — Progress Notes (Signed)
ED CM [facility] needing medications for placement. [facility] [address] [facility] [address]. Spoke with Kathlene NovemberMike pharmacist who is able to refill medications. CM was informed that patient's Elwin SleightDulera is no longer covered by Medicare he recommended Advair as a substitute equivalent. CM discussed with Rhea BleacherJosh Geiple EDP concerning switching medications.  Will call in the change. ED CSW was updated that medications will be ready [facility] [address], no further ED CM needs identified

## 2016-11-14 NOTE — Progress Notes (Signed)
[facility] to the pt's house at approx 2:30pm today (6/29) along with three other officers.  [employer] stated the house was condemned due to water and electricity being cut off.    [employer] stated police and fire dept were called due to pt having broken a window and becoming "stuck while climbing out of the window".  Fire Department had to use equipment to extricate the pt from the window which was approx four feet off the ground on the first floor.    CSW asked if it appeared the pt was breaking in to the house and [employer] officer stated it was "clear" the pt was "climbing out of the window when he became stuck".  Per [employer] the pt was "hanging out of the window".  CSW will continue to follow.   Dorothe PeaJonathan F. Sedale Jenifer, Francesco SorLCSWA, LCAS, CSI Clinical Social Worker Ph: [phone]

## 2016-11-14 NOTE — ED Notes (Signed)
[facility] Expected date:  Expected time:  Means of arrival:  Comments: EMS- 65yo M, fell through window/no complaints

## 2016-11-14 NOTE — Progress Notes (Signed)
CSW spoke to Jinny SandersBetty Davis of the:   [facility]  Attn: Jinny SandersBetty Davis  [address] [address] [place], [place] [address] Ph: [phone] Fax: [phone]  Miss Earlene PlaterDavis agreed after asking questions about medications to admit the pt into her [facility] for his disability check minus $50 and will assist the pt in obtaining his needed medications and assistance with medical providers.  Miss Earlene PlaterDavis agreed to pick the pt up at approx midnight on 11/14/16 and to admit the pt into her residential home.    CSW will provide Miss Earlene PlaterDavis and the pt with the DSS APS social workers assigned to assist the pt with his DSS case.   Pt is agreeable to live with [facility] until he is able to either decide to stay or find other alternatives.    CM [facility] [address].  They will be available tonight to be picked up by Miss Earlene Plateravis.  Dorothe PeaJonathan F. Lourene Hoston, Francesco SorLCSWA, LCAS, CSI Clinical Social Worker Ph: [phone]

## 2016-11-14 NOTE — Progress Notes (Addendum)
CSW met with pt and asked pt questions regarding his water/electricity being on or off and if pt plans to return home.  Pt stated he lived in that house for "close to 13 years" and stated he planned to return home.  Pt did not answer when asked if water/electricity was working, if pt's home is comdemned or if pt has been working with DSS. Pt was asked if DSS has been to see him and pt said, "no" after some time.  CSW asked if pt had bedbugs and pt asked why the CSW would ask that and CSW replied, Last time you were here you told us you had bedbugs".  Pt did not answer.  Pt did not answer any other questions and merely waited quietly as CSW requested information on pt's welfare.  CSW awaiting return call from [employer] who is en route to pt's house to determine if pt's house is condemned as the notes indicate.  Alphonse Guild. Sylvana Bonk, Reed Pandy, CSI Clinical Social Worker Ph: [phone]

## 2016-11-14 NOTE — ED Triage Notes (Signed)
Pt is brought in by EMS who report they were called to pt's house that was reportedly condemned due to dismal living conditions, no power and running water. Pt is also legally blind and his only complaint at this time is being hungry.

## 2016-11-15 ENCOUNTER — Encounter (HOSPITAL_COMMUNITY): Payer: Self-pay

## 2016-11-15 ENCOUNTER — Emergency Department (EMERGENCY_DEPARTMENT_HOSPITAL)
Admission: EM | Admit: 2016-11-15 | Discharge: 2016-11-18 | Disposition: A | Discharge status: Home / Self Care | Payer: Medicare Other | Source: Home / Self Care | Attending: Emergency Medicine | Admitting: Emergency Medicine

## 2016-11-15 DIAGNOSIS — J45909 Unspecified asthma, uncomplicated: Secondary | ICD-10-CM | POA: Diagnosis not present

## 2016-11-15 DIAGNOSIS — L309 Dermatitis, unspecified: Secondary | ICD-10-CM | POA: Diagnosis not present

## 2016-11-15 DIAGNOSIS — F4325 Adjustment disorder with mixed disturbance of emotions and conduct: Secondary | ICD-10-CM

## 2016-11-15 DIAGNOSIS — Z590 Homelessness: Secondary | ICD-10-CM | POA: Diagnosis not present

## 2016-11-15 DIAGNOSIS — S60511A Abrasion of right hand, initial encounter: Secondary | ICD-10-CM | POA: Diagnosis not present

## 2016-11-15 DIAGNOSIS — I10 Essential (primary) hypertension: Secondary | ICD-10-CM | POA: Diagnosis not present

## 2016-11-15 DIAGNOSIS — Z87891 Personal history of nicotine dependence: Secondary | ICD-10-CM | POA: Diagnosis not present

## 2016-11-15 DIAGNOSIS — J449 Chronic obstructive pulmonary disease, unspecified: Secondary | ICD-10-CM | POA: Diagnosis not present

## 2016-11-15 DIAGNOSIS — F333 Major depressive disorder, recurrent, severe with psychotic symptoms: Secondary | ICD-10-CM | POA: Diagnosis not present

## 2016-11-15 DIAGNOSIS — Z742 Need for assistance at home and no other household member able to render care: Secondary | ICD-10-CM | POA: Diagnosis present

## 2016-11-15 DIAGNOSIS — Z049 Encounter for examination and observation for unspecified reason: Secondary | ICD-10-CM

## 2016-11-15 DIAGNOSIS — W1839XA Other fall on same level, initial encounter: Secondary | ICD-10-CM | POA: Diagnosis not present

## 2016-11-15 DIAGNOSIS — R45851 Suicidal ideations: Secondary | ICD-10-CM | POA: Diagnosis not present

## 2016-11-15 DIAGNOSIS — F329 Major depressive disorder, single episode, unspecified: Secondary | ICD-10-CM | POA: Diagnosis not present

## 2016-11-15 DIAGNOSIS — Z79899 Other long term (current) drug therapy: Secondary | ICD-10-CM | POA: Diagnosis not present

## 2016-11-15 DIAGNOSIS — Y9289 Other specified places as the place of occurrence of the external cause: Secondary | ICD-10-CM | POA: Diagnosis not present

## 2016-11-15 DIAGNOSIS — H548 Legal blindness, as defined in USA: Secondary | ICD-10-CM | POA: Diagnosis not present

## 2016-11-15 DIAGNOSIS — Y990 Civilian activity done for income or pay: Secondary | ICD-10-CM | POA: Diagnosis not present

## 2016-11-15 DIAGNOSIS — Y9389 Activity, other specified: Secondary | ICD-10-CM | POA: Diagnosis not present

## 2016-11-15 DIAGNOSIS — T148XXA Other injury of unspecified body region, initial encounter: Secondary | ICD-10-CM

## 2016-11-15 DIAGNOSIS — R451 Restlessness and agitation: Secondary | ICD-10-CM | POA: Diagnosis not present

## 2016-11-15 DIAGNOSIS — F323 Major depressive disorder, single episode, severe with psychotic features: Secondary | ICD-10-CM | POA: Diagnosis present

## 2016-11-15 DIAGNOSIS — E440 Moderate protein-calorie malnutrition: Secondary | ICD-10-CM

## 2016-11-15 DIAGNOSIS — M009 Pyogenic arthritis, unspecified: Secondary | ICD-10-CM

## 2016-11-15 LAB — CBC WITH DIFFERENTIAL/PLATELET
Basophils Absolute: 0.1 10*3/uL (ref 0.0–0.1)
Basophils Relative: 1 %
Eosinophils Absolute: 1.3 10*3/uL — ABNORMAL HIGH (ref 0.0–0.7)
Eosinophils Relative: 14 %
HEMATOCRIT: 38.8 % — AB (ref 39.0–52.0)
Hemoglobin: 13.5 g/dL (ref 13.0–17.0)
LYMPHS ABS: 1.2 10*3/uL (ref 0.7–4.0)
LYMPHS PCT: 14 %
MCH: 31.3 pg (ref 26.0–34.0)
MCHC: 34.8 g/dL (ref 30.0–36.0)
MCV: 90 fL (ref 78.0–100.0)
MONO ABS: 0.7 10*3/uL (ref 0.1–1.0)
MONOS PCT: 8 %
NEUTROS ABS: 5.6 10*3/uL (ref 1.7–7.7)
Neutrophils Relative %: 63 %
Platelets: 239 10*3/uL (ref 150–400)
RBC: 4.31 MIL/uL (ref 4.22–5.81)
RDW: 13.3 % (ref 11.5–15.5)
WBC: 8.8 10*3/uL (ref 4.0–10.5)

## 2016-11-15 LAB — COMPREHENSIVE METABOLIC PANEL
ALBUMIN: 4 g/dL (ref 3.5–5.0)
ALT: 24 U/L (ref 17–63)
AST: 36 U/L (ref 15–41)
Alkaline Phosphatase: 84 U/L (ref 38–126)
Anion gap: 7 (ref 5–15)
BUN: 18 mg/dL (ref 6–20)
CHLORIDE: 114 mmol/L — AB (ref 101–111)
CO2: 22 mmol/L (ref 22–32)
CREATININE: 1.31 mg/dL — AB (ref 0.61–1.24)
Calcium: 8.9 mg/dL (ref 8.9–10.3)
GFR calc Af Amer: >60 mL/min (ref >60)
GFR calc non Af Amer: 56 mL/min — ABNORMAL LOW (ref >60)
GLUCOSE: 143 mg/dL — AB (ref 65–99)
Potassium: 3.4 mmol/L — ABNORMAL LOW (ref 3.5–5.1)
Sodium: 143 mmol/L (ref 135–145)
Total Bilirubin: 0.7 mg/dL (ref 0.3–1.2)
Total Protein: 7.2 g/dL (ref 6.5–8.1)

## 2016-11-15 LAB — ETHANOL: Alcohol, Ethyl (B): <5 mg/dL (ref <5)

## 2016-11-15 LAB — ACETAMINOPHEN LEVEL: Acetaminophen (Tylenol), Serum: <10 ug/mL — ABNORMAL LOW (ref 10–30)

## 2016-11-15 LAB — SALICYLATE LEVEL: Salicylate Lvl: <7 mg/dL (ref 2.8–30.0)

## 2016-11-15 MED ORDER — HALOPERIDOL LACTATE 5 MG/ML IJ SOLN: 1.0000 mg | Freq: Four times a day (QID) | INTRAMUSCULAR | Status: DC | PRN

## 2016-11-15 MED ORDER — LORAZEPAM 2 MG/ML IJ SOLN: 1.0000 mg | INTRAMUSCULAR | Status: DC | PRN

## 2016-11-15 MED ORDER — STERILE WATER FOR INJECTION IJ SOLN
INTRAMUSCULAR | Status: AC
  Administered 2016-11-15: 22:00:00
  Filled 2016-11-15: qty 10

## 2016-11-15 MED ORDER — ZIPRASIDONE MESYLATE 20 MG IM SOLR
10.0000 mg | Freq: Once | INTRAMUSCULAR | Status: AC
  Administered 2016-11-15: 10 mg via INTRAMUSCULAR
  Filled 2016-11-15: qty 20

## 2016-11-15 NOTE — ED Notes (Signed)
[facility] Expected date: 11/15/16 Expected time: 2:17 PM Means of arrival: Ambulance Comments: IVC,  blind
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
## 2016-11-15 NOTE — ED Notes (Signed)
Jinny SandersBetty Davis of [facility] here to pick up pt.

## 2016-11-15 NOTE — ED Notes (Signed)
NT attempted blood draw without success.  

## 2016-11-15 NOTE — [facility] Assessment (Signed)
[facility] Assessment Progress Note   Case was staffed with Dominga FerryAgustin NP who recommended patient be admitted inpatient [facility](Gero[facility]Psych) as appropriate bed placement is investigated.
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
## 2016-11-16 ENCOUNTER — Emergency Department (HOSPITAL_COMMUNITY): Payer: Medicare Other

## 2016-11-16 DIAGNOSIS — R45851 Suicidal ideations: Secondary | ICD-10-CM

## 2016-11-16 DIAGNOSIS — F4325 Adjustment disorder with mixed disturbance of emotions and conduct: Secondary | ICD-10-CM

## 2016-11-16 DIAGNOSIS — Z87891 Personal history of nicotine dependence: Secondary | ICD-10-CM

## 2016-11-16 DIAGNOSIS — F333 Major depressive disorder, recurrent, severe with psychotic symptoms: Secondary | ICD-10-CM | POA: Diagnosis present

## 2016-11-16 DIAGNOSIS — F323 Major depressive disorder, single episode, severe with psychotic features: Secondary | ICD-10-CM | POA: Diagnosis present

## 2016-11-16 DIAGNOSIS — Z742 Need for assistance at home and no other household member able to render care: Secondary | ICD-10-CM | POA: Diagnosis not present

## 2016-11-16 DIAGNOSIS — R45850 Homicidal ideations: Secondary | ICD-10-CM

## 2016-11-16 LAB — URINALYSIS, ROUTINE W REFLEX MICROSCOPIC
BILIRUBIN URINE: NEGATIVE
GLUCOSE, UA: NEGATIVE mg/dL
Hgb urine dipstick: NEGATIVE
Ketones, ur: NEGATIVE mg/dL
Leukocytes, UA: NEGATIVE
Nitrite: NEGATIVE
PH: 5 (ref 5.0–8.0)
Protein, ur: NEGATIVE mg/dL
Specific Gravity, Urine: 1.019 (ref 1.005–1.030)

## 2016-11-16 LAB — RAPID URINE DRUG SCREEN, HOSP PERFORMED
AMPHETAMINES: NOT DETECTED
BENZODIAZEPINES: POSITIVE — AB
Barbiturates: NOT DETECTED
COCAINE: NOT DETECTED
OPIATES: NOT DETECTED
TETRAHYDROCANNABINOL: NOT DETECTED

## 2016-11-16 MED ORDER — LORAZEPAM 2 MG/ML IJ SOLN
2.0000 mg | Freq: Once | INTRAMUSCULAR | Status: AC
  Administered 2016-11-16: 2 mg via INTRAMUSCULAR
  Filled 2016-11-16: qty 1

## 2016-11-16 MED ORDER — SERTRALINE HCL 50 MG PO TABS: 100.0000 mg | ORAL_TABLET | Freq: Every day | ORAL | Status: DC

## 2016-11-16 MED ORDER — HYDROCORTISONE 1 % EX CREA
TOPICAL_CREAM | Freq: Two times a day (BID) | CUTANEOUS | Status: DC
  Administered 2016-11-16 (×2): 1 via TOPICAL
  Administered 2016-11-17 – 2016-11-18 (×2): via TOPICAL
  Filled 2016-11-16 (×2): qty 28

## 2016-11-16 MED ORDER — DIPHENHYDRAMINE HCL 25 MG PO CAPS
25.0000 mg | ORAL_CAPSULE | Freq: Once | ORAL | Status: AC
  Administered 2016-11-16: 25 mg via ORAL
  Filled 2016-11-16: qty 1

## 2016-11-16 MED ORDER — GABAPENTIN 100 MG PO CAPS
200.0000 mg | ORAL_CAPSULE | Freq: Two times a day (BID) | ORAL | Status: DC
  Administered 2016-11-16 (×2): 200 mg via ORAL
  Filled 2016-11-16 (×3): qty 2

## 2016-11-16 MED ORDER — SERTRALINE HCL 50 MG PO TABS
25.0000 mg | ORAL_TABLET | Freq: Every day | ORAL | Status: DC
  Administered 2016-11-16 – 2016-11-17 (×2): 25 mg via ORAL
  Filled 2016-11-16 (×2): qty 1

## 2016-11-16 NOTE — ED Notes (Signed)
Pt kept on yelling, "I want my doctor!"  EDP approached and talked with pt.

## 2016-11-16 NOTE — Progress Notes (Signed)
Per psychiatrist Jannifer FranklinAkintayo and DNP Shaune PollackLord, patient meets inpatient criteria. CSW faxed patient's referral to: [facility], [facility], [facility], [facility] and [facility].   Celso SickleKimberly Maxwell Martorano, LCSWA [facility]  Clinical Social Worker [phone]

## 2016-11-16 NOTE — ED Notes (Signed)
Patient had soiled sheets. Sitter and I changed patients sheets

## 2016-11-16 NOTE — ED Notes (Signed)
Patient awake and asking for food. Patient allowing this tech and sitter to help patient with eating, ekg and vital signs. Patient is scratching and states that lotion will burn. Notified RN of patients skin condition.
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
## 2016-11-16 NOTE — ED Notes (Signed)
Patient transported to X-ray 
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
## 2016-11-16 NOTE — Progress Notes (Signed)
CS[facility]. Staff member Raquelle reported that patient is on their wait list.   Celso SickleKimberly Melquisedec Journey, LCSWA [facility]  Clinical Social Worker [phone]

## 2016-11-16 NOTE — Progress Notes (Signed)
CSW filed patient's examination and recommendation paperwork into IVC logbook.  Decklin Weddington, LCSWA [facility]  Clinical Social Worker [phone] 

## 2016-11-16 NOTE — ED Notes (Signed)
Pt kept on scratching upper trunk and then took off shirt.  Pt was offered wine scrub but grabbed it and threw it on the floor, yelling, "Leave me alone".

## 2016-11-16 NOTE — ED Provider Notes (Signed)
Pt yelling out demanding to speak to physician He is very aggressive, but is directable.  He reports he can't sleep on matttress Blankets given to patient but he reports it is not improved He is now yelling out He was moved to a room Ativan ordered Awaiting placement Restraints ordered   Zadie RhineWickline, Aarilyn Dye, MD 11/16/16 0405

## 2016-11-16 NOTE — ED Notes (Signed)
Patient is awake and moving around. Patient stated that he doesnt want breakfast. When sitter tried to see if the patient wanted anything else the patient took out his false eyeball.

## 2016-11-16 NOTE — ED Provider Notes (Signed)
Urine finally obtained and looks normal.  The NT did mention that it took quite awhile for pt to start his urine stream and to finish urinating.  Pt may need urology follow up for prostate after inpatient psych admission.  Pt also requesting cream for rash.  He was given hydrocortisone and benadryl.   Pt is medically clear.   Jacalyn LefevreHaviland, Drayven Marchena, MD 11/16/16 [phone]

## 2016-11-16 NOTE — ED Notes (Signed)
Pt calm at this time and appropriately asked for grape juice--- soft restraints removed at this time.  Pt was given Malawiturkey sandwich and cran-grape juice.

## 2016-11-17 DIAGNOSIS — Z742 Need for assistance at home and no other household member able to render care: Secondary | ICD-10-CM | POA: Diagnosis not present

## 2016-11-17 DIAGNOSIS — F333 Major depressive disorder, recurrent, severe with psychotic symptoms: Secondary | ICD-10-CM | POA: Diagnosis not present

## 2016-11-17 DIAGNOSIS — Z87891 Personal history of nicotine dependence: Secondary | ICD-10-CM | POA: Diagnosis not present

## 2016-11-17 DIAGNOSIS — R45851 Suicidal ideations: Secondary | ICD-10-CM | POA: Diagnosis not present

## 2016-11-17 DIAGNOSIS — F4325 Adjustment disorder with mixed disturbance of emotions and conduct: Secondary | ICD-10-CM | POA: Diagnosis not present

## 2016-11-17 DIAGNOSIS — R45850 Homicidal ideations: Secondary | ICD-10-CM | POA: Diagnosis not present

## 2016-11-17 MED ORDER — PREDNISONE 20 MG PO TABS
20.0000 mg | ORAL_TABLET | Freq: Two times a day (BID) | ORAL | Status: DC
  Administered 2016-11-17 – 2016-11-18 (×2): 20 mg via ORAL
  Filled 2016-11-17 (×2): qty 1

## 2016-11-17 MED ORDER — PREDNISONE 20 MG PO TABS
60.0000 mg | ORAL_TABLET | Freq: Once | ORAL | Status: AC
  Administered 2016-11-17: 60 mg via ORAL
  Filled 2016-11-17: qty 3

## 2016-11-17 MED ORDER — HYDROXYZINE HCL 25 MG PO TABS
25.0000 mg | ORAL_TABLET | Freq: Four times a day (QID) | ORAL | Status: DC | PRN
  Administered 2016-11-17 – 2016-11-18 (×2): 25 mg via ORAL
  Filled 2016-11-17 (×2): qty 1

## 2016-11-17 MED ORDER — GABAPENTIN 100 MG PO CAPS: 200.0000 mg | ORAL_CAPSULE | Freq: Two times a day (BID) | ORAL | 0 refills | Status: DC

## 2016-11-17 MED ORDER — HYDROXYZINE HCL 25 MG PO TABS: 25.0000 mg | ORAL_TABLET | Freq: Four times a day (QID) | ORAL | 0 refills | Status: DC | PRN

## 2016-11-17 MED ORDER — SERTRALINE HCL 25 MG PO TABS: 25.0000 mg | ORAL_TABLET | Freq: Every day | ORAL | 0 refills | Status: DC

## 2016-11-17 MED ORDER — PREDNISONE 20 MG PO TABS: 20.0000 mg | ORAL_TABLET | Freq: Two times a day (BID) | ORAL | 0 refills | Status: DC

## 2016-11-17 NOTE — ED Provider Notes (Signed)
Nursing reports that the patient is complaining of pruritus at this time.  He was treated yesterday, with hydrocortisone cream and Benadryl, for a pruritic rash.  At this time the patient complains of "eczema since birth."  He is unable to specify exactly what he does for it on a routine basis.  He states that he sees a doctor, but cannot specify where the office is.  Currently the patient is scratching his face.  He has a reddened and thickened appearance to the facial skin, bilaterally.  There are some areas of weepiness.  There are also multiple excoriations, on his torso, and extremities.  He has the appearance of a generalized eczematous reaction.  This appears to be exacerbation of the chronic problem.  Treatment initiated at this time-prednisone 60 mg p.o., followed by 20 mg twice a day for 5 days.  Treat pruritus, with Atarax, 25 mg 4 times daily as needed.   Mancel BaleWentz, Miranda Garber, MD 11/17/16 [phone]
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
## 2016-11-17 NOTE — Consult Note (Signed)
[facility] Face-to-Face Psychiatry Consult   Reason for Consult:  Suicidal and homicidal ideations Referring Physician:  EDP Patient Identification: [name] [name] MRN:  [id number] Principal Diagnosis: Adjustment disorder with mixed disturbance of emotions and conduct Diagnosis:   Patient Active Problem List   Diagnosis Date Noted  . Adjustment disorder with mixed disturbance of emotions and conduct [F43.25] 11/17/2016    Priority: High  . 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 Lumbar back pain [M54.5] 02/04/2016  . AKI (acute kidney injury) (HCC) [N17.9] 01/08/2016  . Closed compression fracture of L3 lumbar vertebra (HCC) [S32.030A] 01/08/2016  . Pruritus [L29.9] 10/01/2015  . Acute maxillary sinusitis [J01.00] 06/03/2014  . Severe asthma [J45.998]   . Acute on chronic respiratory failure (HCC) [J96.20] 05/31/2014  . Chronic cough [R05] 05/31/2014  . Asthma with acute exacerbation [J45.901] 05/31/2014  . Chronic pain syndrome [G89.4] 05/31/2014  . Malnutrition of moderate degree (HCC) [E44.0] 05/30/2014  . Hypokalemia [E87.6] 05/28/2014  . SOB (shortness of breath) [R06.02] 05/28/2014  . Weakness of back [R29.898] 02/03/2014  . Weakness of both legs [R29.898] 02/03/2014  . Gait instability [R26.81] 12/01/2013  . Allergic contact dermatitis [L23.9] 12/24/2011  . 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 Wrist joint infection (HCC) [M00.9] 10/10/2011    Class: Diagnosis of  . Urinary retention [R33.9] 09/16/2011  . 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 TACHYCARDIA [R00.0] 07/16/2010  . BLINDNESS [H54.8] 10/27/2008  . Sinusitis, chronic [J32.9] 08/03/2007  . Allergic rhinitis [J30.9] 08/03/2007  . Asthma [J45.909] 08/03/2007  . Esophageal reflux [K21.9] 08/03/2007  . ECZEMA [L25.9] 08/03/2007  . OSTEOPENIA [M89.9, M94.9] 08/03/2007  . ULCERATIVE COLITIS, HX OF [Z87.19] 08/03/2007    Total Time spent with patient: 30  minutes  Subjective:   [name] [name] is a 66 y.o. male patient has stabilized.  HPI:  66 yo male who was brought in yesterday and was agitated.  He was given medications and stabilized.  [name] [name] was calm and cooperative today with no suicidal/homicidal ideations, hallucinations, or alcohol/drug abuse.  He is stable to return home, concerned about the bed bugs that were suppose to be exterminated.  [name] [name] is jovial today and no longer angry.  Released from psychiatry to social work for housing issue.  Past Psychiatric History: depression  Risk to Self: None Risk to Others: NOne Prior Inpatient Therapy: Prior Inpatient Therapy: Yes Prior Therapy Dates: 2018 (per note review) Prior Therapy Facilty/Provider(s): [facility] Reason for Treatment: MH issues Prior Outpatient Therapy: Prior Outpatient Therapy:  (UTA) Prior Therapy Dates:  (UTA) Prior Therapy Facilty/Provider(s):  (UTA) Reason for Treatment:  (UTA) Does patient have an ACCT team?: Unknown Does patient have Intensive In-House Services?  : Unknown Does patient have [facility] services? : Unknown Does patient have P4CC services?: Unknown  Past Medical History:  Past Medical History:  Diagnosis Date  . Anxiety   . Arthritis    spine  . Chronic lower back pain   . Contact dermatitis and other eczema, due to unspecified cause   . COPD (chronic obstructive pulmonary disease) (HCC)   . Cough   . Depression    patient constantly hitting left side of face "due to pain"  . Enlarged prostate   . Esophageal reflux   . H/O hiatal hernia   . Headache    PMH: migraines  . Hep C w/o coma, chronic (HCC) early 2013  . History of eye prosthesis   . History of kidney stones   . Hypertension  not on medication at this time  . 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 Multiple facial bone fractures ([place])   . Multiple open wounds    "from esczema- scratching"  . Osteopenia   . Pancreatitis   . Pneumonia    in past had  several times  . Seasonal allergies   . Ulcerative colitis   . Unspecified asthma(493.90)   . Unspecified sinusitis (chronic)     Past Surgical History:  Procedure Laterality Date  . CARPAL TUNNEL RELEASE Right 06/15/2013   Procedure: RIGHT CARPAL TUNNEL RELEASE;  Surgeon: Schuyler Amor, MD;  Location: [place];  Service: Orthopedics;  Laterality: Right;  . COLONOSCOPY W/ BIOPSIES AND POLYPECTOMY    . ENUCLEATION Left   . EYE SURGERY     3 Corneal transplant (bilateral eyes).    . FRACTURE SURGERY    . HARDWARE REMOVAL  10/09/2011   Procedure: HARDWARE REMOVAL;  Surgeon: Newt Minion, MD;  Location: [place];  Service: Orthopedics;  Laterality: Right;  Removal Deep Hardware Right Wrist, placement antibiotic beads.  . INGUINAL HERNIA REPAIR Left    age 14  . Litrotripsy    . metacarpal pinning Right    right hand  . NERVE, TENDON AND ARTERY REPAIR Right 12/05/2015   Procedure: RIGHT WRIST, HAND FLEXOR TENDON LENGTHENING;  Surgeon: Charlotte Crumb, MD;  Location: [place];  Service: Orthopedics;  Laterality: Right;  . NEUROLYSIS OF MEDIAN NERVE Right 12/05/2015   Procedure: RIGHT MEDIAN NERVE NEUROLYSIS ;  Surgeon: Charlotte Crumb, MD;  Location: [place];  Service: Orthopedics;  Laterality: Right;  . ORIF DISTAL RADIUS FRACTURE  09/12/11   right  . ORIF WRIST FRACTURE Right 06/15/2013   Procedure: RIGHT RADIUS PSTEOTOMY, DISTAL ULNA RESECTION, AND DIGITAL FLEXOR LENGTHENING AS NEEDED;  Surgeon: Schuyler Amor, MD;  Location: [place];  Service: Orthopedics;  Laterality: Right;  . PATELLA FRACTURE SURGERY     right  . RADIOLOGY WITH ANESTHESIA N/A 03/09/2014   Procedure: MRI - Cervical and Lumbar without Contrast;  Surgeon: Medication Radiologist, MD;  Location: [place];  Service: Radiology;  Laterality: N/A;  . RADIOLOGY WITH ANESTHESIA N/A 04/03/2015   Procedure: MRI OF BRAIN    (RADIOLOGY WITH ANESTHESIA);  Surgeon: Medication Radiologist, MD;   Location: [place];  Service: Radiology;  Laterality: N/A;  . TONSILLECTOMY AND ADENOIDECTOMY     Family History:  Family History  Problem Relation Age of Onset  . Allergies Mother        atopic  . Asthma Mother   . Heart failure Mother        CHF  . Other Father   . Anesthesia problems Neg Hx    Family Psychiatric  History: unknown Social History:  History  Alcohol Use  . Yes    Comment: "scotch once a year"     History  Drug Use No    Comment: 01/25/15-Patient reports "I'm not going to answer that"- Last time used March 2017.    Social History   Social History  . Marital status: Single    Spouse name: N/A  . Number of children: 1  . Years of education: 12+   Occupational History  . does not work   . Disability     Social History Main Topics  . Smoking status: Former Smoker    Packs/day: 1.00    Years: 3.00    Types: Cigarettes, Pipe  Quit date: 05/20/1983  . Smokeless tobacco: Never Used  . Alcohol use Yes     Comment: "scotch once a year"  . Drug use: No     Comment: 01/25/15-Patient reports "I'm not going to answer that"- Last time used March 2017.  Marland Kitchen Sexual activity: Not Asked   Other Topics Concern  . None   Social History Narrative   Lives alone   Blind   Patient has 1 child.    Patient is right handed.    Patient has 12+ years of education.    Patient drinks about 3 cups of caffeine daily.            Additional Social History:    Allergies:   Allergies  Allergen Reactions  . Bacitracin-Polymyxin B Other (See Comments)    Pt states that his ophthalmologist told him he was allergic to this.     . Bee Venom Anaphylaxis  . Codeine Anaphylaxis  . Norco [Hydrocodone-Acetaminophen] Other (See Comments)    Reaction:  Headaches   . Penicillins Anaphylaxis and Other (See Comments)    Tolerated numerous cephalosporins in past.  . Shellfish-Derived Products Anaphylaxis  . Other Hives and Other (See Comments)    Pt states that he is allergic to  dust, pollen, and mold.   . Tape Hives  . Chlorhexidine Itching and Rash  . Sulfate Itching and Rash    Labs:  Results for orders placed or performed during the hospital encounter of 11/15/16 (from the past 48 hour(s))  Comprehensive metabolic panel     Status: Abnormal   Collection Time: 11/15/16  7:30 PM  Result Value Ref Range   Sodium 143 135 - 145 mmol/L   Potassium 3.4 (L) 3.5 - 5.1 mmol/L   Chloride 114 (H) 101 - 111 mmol/L   CO2 22 22 - 32 mmol/L   Glucose, Bld 143 (H) 65 - 99 mg/dL   BUN 18 6 - 20 mg/dL   Creatinine, Ser 0.92 (H) 0.61 - 1.24 mg/dL   Calcium 8.9 8.9 - 94.1 mg/dL   Total Protein 7.2 6.5 - 8.1 g/dL   Albumin 4.0 3.5 - 5.0 g/dL   AST 36 15 - 41 U/L   ALT 24 17 - 63 U/L   Alkaline Phosphatase 84 38 - 126 U/L   Total Bilirubin 0.7 0.3 - 1.2 mg/dL   GFR calc non Af Amer 56 (L) >60 mL/min   GFR calc Af Amer >60 >60 mL/min    Comment: (NOTE) The eGFR has been calculated using the CKD EPI equation. This calculation has not been validated in all clinical situations. eGFR's persistently <60 mL/min signify possible Chronic Kidney Disease.    Anion gap 7 5 - 15  CBC with Diff     Status: Abnormal   Collection Time: 11/15/16  7:30 PM  Result Value Ref Range   WBC 8.8 4.0 - 10.5 K/uL   RBC 4.31 4.22 - 5.81 MIL/uL   Hemoglobin 13.5 13.0 - 17.0 g/dL   HCT 65.0 (L) 81.3 - 65.3 %   MCV 90.0 78.0 - 100.0 fL   MCH 31.3 26.0 - 34.0 pg   MCHC 34.8 30.0 - 36.0 g/dL   RDW 11.3 94.9 - 14.8 %   Platelets 239 150 - 400 K/uL   Neutrophils Relative % 63 %   Neutro Abs 5.6 1.7 - 7.7 K/uL   Lymphocytes Relative 14 %   Lymphs Abs 1.2 0.7 - 4.0 K/uL   Monocytes Relative 8 %  Monocytes Absolute 0.7 0.1 - 1.0 K/uL   Eosinophils Relative 14 %   Eosinophils Absolute 1.3 (H) 0.0 - 0.7 K/uL   Basophils Relative 1 %   Basophils Absolute 0.1 0.0 - 0.1 K/uL  Ethanol     Status: None   Collection Time: 11/15/16  7:31 PM  Result Value Ref Range   Alcohol, Ethyl (B) <5 <5 mg/dL     Comment:        LOWEST DETECTABLE LIMIT FOR SERUM ALCOHOL IS 5 mg/dL FOR MEDICAL PURPOSES ONLY   Acetaminophen level     Status: Abnormal   Collection Time: 11/15/16  7:31 PM  Result Value Ref Range   Acetaminophen (Tylenol), Serum <10 (L) 10 - 30 ug/mL    Comment:        THERAPEUTIC CONCENTRATIONS VARY SIGNIFICANTLY. A RANGE OF 10-30 ug/mL MAY BE AN EFFECTIVE CONCENTRATION FOR MANY PATIENTS. HOWEVER, SOME ARE BEST TREATED AT CONCENTRATIONS OUTSIDE THIS RANGE. ACETAMINOPHEN CONCENTRATIONS >150 ug/mL AT 4 HOURS AFTER INGESTION AND >50 ug/mL AT 12 HOURS AFTER INGESTION ARE OFTEN ASSOCIATED WITH TOXIC REACTIONS.   Salicylate level     Status: None   Collection Time: 11/15/16  7:31 PM  Result Value Ref Range   Salicylate Lvl <7.6 2.8 - 30.0 mg/dL  Rapid urine drug screen (hospital performed)     Status: Abnormal   Collection Time: 11/16/16  2:52 AM  Result Value Ref Range   Opiates NONE DETECTED NONE DETECTED   Cocaine NONE DETECTED NONE DETECTED   Benzodiazepines POSITIVE (A) NONE DETECTED   Amphetamines NONE DETECTED NONE DETECTED   Tetrahydrocannabinol NONE DETECTED NONE DETECTED   Barbiturates NONE DETECTED NONE DETECTED    Comment:        DRUG SCREEN FOR MEDICAL PURPOSES ONLY.  IF CONFIRMATION IS NEEDED FOR ANY PURPOSE, NOTIFY LAB WITHIN 5 DAYS.        LOWEST DETECTABLE LIMITS FOR URINE DRUG SCREEN Drug Class       Cutoff (ng/mL) Amphetamine      1000 Barbiturate      200 Benzodiazepine   283 Tricyclics       151 Opiates          300 Cocaine          300 THC              50   Urinalysis, Routine w reflex microscopic     Status: None   Collection Time: 11/16/16 12:42 PM  Result Value Ref Range   Color, Urine YELLOW YELLOW   APPearance CLEAR CLEAR   Specific Gravity, Urine 1.019 1.005 - 1.030   pH 5.0 5.0 - 8.0   Glucose, UA NEGATIVE NEGATIVE mg/dL   Hgb urine dipstick NEGATIVE NEGATIVE   Bilirubin Urine NEGATIVE NEGATIVE   Ketones, ur NEGATIVE  NEGATIVE mg/dL   Protein, ur NEGATIVE NEGATIVE mg/dL   Nitrite NEGATIVE NEGATIVE   Leukocytes, UA NEGATIVE NEGATIVE    Current Facility-Administered Medications  Medication Dose Route Frequency Provider Last Rate Last Dose  . gabapentin (NEURONTIN) capsule 200 mg  200 mg Oral BID Sinahi Knights, MD   200 mg at 11/17/16 1027  . hydrocortisone cream 1 %   Topical BID Isla Pence, MD      . hydrOXYzine (ATARAX/VISTARIL) tablet 25 mg  25 mg Oral Q6H PRN Daleen Bo, MD   25 mg at 11/17/16 1027  . predniSONE (DELTASONE) tablet 20 mg  20 mg Oral BID WC Daleen Bo, MD      .  sertraline (ZOLOFT) tablet 25 mg  25 mg Oral Daily Patrecia Pour, NP   25 mg at 11/17/16 1027   Current Outpatient Prescriptions  Medication Sig Dispense Refill  . PROAIR HFA 108 (90 Base) MCG/ACT inhaler INHALE 2 PUFFS INTO THE LUNGS EVERY 6 HOURS AS NEEDED FOR SHORTNESS OF BREATH 8.5 Inhaler 2  . albuterol (PROVENTIL HFA;VENTOLIN HFA) 108 (90 Base) MCG/ACT inhaler Inhale 2 puffs into the lungs every 6 (six) hours as needed for wheezing or shortness of breath. 1 Inhaler 0  . albuterol (PROVENTIL) (2.5 MG/3ML) 0.083% nebulizer solution Take 3 mLs (2.5 mg total) by nebulization every 4 (four) hours as needed for wheezing or shortness of breath. 360 mL 1  . bisacodyl (DULCOLAX) 10 MG suppository Place 1 suppository (10 mg total) rectally as needed for moderate constipation. 12 suppository 0  . diazepam (VALIUM) 10 MG tablet Take 1 tablet (10 mg total) by mouth 3 (three) times daily. 10 tablet 0  . DULERA 200-5 MCG/ACT AERO INHALE 2 PUFFS INTO THE LUNGS TWICE A DAY (Patient not taking: Reported on 11/04/2016) 13 Inhaler 5  . EPINEPHrine (EPIPEN 2-PAK) 0.3 mg/0.3 mL IJ SOAJ injection Inject 0.3 mg into the muscle once as needed (for severe allergic reaction).    . fluticasone (FLONASE) 50 MCG/ACT nasal spray USE 2 SPRAYS IN EACH NOSTRIL TWICE DAILY. 16 g 3  . mometasone-formoterol (DULERA) 200-5 MCG/ACT AERO 2 puffs  inhaled twice daily. Needs office visit for future refills. (Patient taking differently: Inhale 2 puffs into the lungs 2 (two) times daily as needed for wheezing or shortness of breath. ) 13 g 0    Musculoskeletal: Strength & Muscle Tone: within normal limits Gait & Station: normal Patient leans: N/A  Psychiatric Specialty Exam: Physical Exam  Constitutional: He is oriented to person, place, and time. He appears well-developed and well-nourished.  HENT:  Head: Normocephalic.  Neck: Normal range of motion.  Respiratory: Effort normal.  Musculoskeletal: Normal range of motion.  Neurological: He is alert and oriented to person, place, and time.  Psychiatric: He has a normal mood and affect. His speech is normal and behavior is normal. Judgment and thought content normal. Cognition and memory are normal.    Review of Systems  Psychiatric/Behavioral: Negative.   All other systems reviewed and are negative.   Blood pressure 109/71, pulse 84, temperature 97.9 F (36.6 C), temperature source Oral, resp. rate 18, SpO2 98 %.There is no height or weight on file to calculate BMI.  General Appearance: Casual  Eye Contact:  Good  Speech:  Normal Rate  Volume:  WDL  Mood:  Euthymic  Affect:  Congruent  Thought Process:  Descriptions of Associations: Intact  Orientation:  Other:  person and place and time  Thought Content:  WDL, logical  Suicidal Thoughts:  No  Homicidal Thoughts:  No  Memory:  Immediate, good; recent, good; remote, good  Judgement:  Fair  Insight:  Fair  Psychomotor Activity:  Normal  Concentration:  Concentration and attention span:  Good  Recall:  Good  Fund of Knowledge:  Fair  Language:  Good  Akathisia:  No  Handed:  Right  AIMS (if indicated):     Assets:  Leisure Time Resilience Social Support  ADL's:  Intact  Cognition:  WDL  Sleep:        Treatment Plan Summary: Adjustment disorder with disturbance of emotions and conduct: -Crisis  stabilization -Medication management:  Continued Tegretol 200 mg BID for mood stabilization and Zoloft 25  mg daily for depression -Individual counseling  Disposition: Discharge home, social worker coordination  Waylan Boga, NP 11/17/2016 11:04 AM  Patient seen face-to-face for psychiatric evaluation, chart reviewed and case discussed with the physician extender and developed treatment plan. Reviewed the information documented and agree with the treatment plan. Corena Pilgrim, MD

## 2016-11-17 NOTE — BH Assessment (Signed)
[facility] Assessment Progress Note  Per Thedore MinsMojeed Akintayo, MD, this pt does not require psychiatric hospitalization at this time.  Pt presents under IVC initiated by his caregiver, Annita BrodBettie Davis, which Dr Jannifer FranklinAkintayo has rescinded.  [facility] to his residential facility.  Stacy GardnerErin Davenport, LCSWA, agrees to facilitate this.  Pt's nurse has been notified.  Doylene Canninghomas Eljay Lave, MA Triage Specialist [phone]

## 2016-11-17 NOTE — Progress Notes (Addendum)
Per CSW Asst Director if pt is not accepted by [facility] [place] or by [facility] in [place] then pt will be likley to be accepted to [facility] in [place].    Per Asst Director pt will have to D/C to first available bed whether it be private or semi-private or pt will be D/C'd as was the original plan on 11/17/16.  CSW has counseled the pt.  Per previous notes: April "Regional Manager of [name] reported [name] JacobsonKathy [name] from [facility] will call on 11/18/16 and then come by tomorrow morning to the ED to assess the patient, verify the pt's Medicaid number and April stated that if the patient is appropriate for a Medicaid room, [name] are usually are able to turn right around and admit them very quickly".  Per [name] [name] pt's FL-2 will have to demonstrate needs hands-on assistance with three tasks/ADL, bathing, feeding, dressing etc.  Pt's preference is [name] in [place].   [name] PeaJonathan F. Rylyn [name], [name] SorLCSWA, LCAS, CSI Clinical Social Worker Ph: [phone]

## 2016-11-17 NOTE — Progress Notes (Addendum)
CSW received a call from [place] regarding the pt and asking his status with DSS whom we were told has an open case on the pt. DSS caseworker Ernest Pine stated pt had repeatedly refused placement assistance to an ALF by DSS and as a result his DSS case has closed.  CSW was informed pt was medically and psychiatrically cleared and is ready for D/C.    CSW met with pt, offered pt a bus pass to the in-town destination of his choice and pt refused bus pass and refused to D/C.  CSW consulted with [facility] who notified security.  Pt then refused to D/C in presence of the RN, security, [employer] and AC.  Pt again refused to D/C to a shelter, the [facility] to [facility], a motel, or placement into an assisted living facility due to a requirement he sign over his entire social security check minus $60.  Pt then refused to leave the ED when security and [employer] stated they were escorting pt out.  CSW was asked to provide pt with a bus pass, pt again refused to D/C and refused to be removed from his bed.  CSW was asked by CSW Dept Asst Director to contact [facility] to [place] to request they house the pt for a night until pt receives his disability check on 11/18/16, Miss Rosana Hoes refused.  Pt called Health visitor , Mudlogger did not call back.    CSW Asst Director then advised CSW to again ask pt if he would accept pt, with LOG approved by Market [employer], to either [place] or [place].  Pt stated he would accept placement into [place] if he could have a private room.    CSW called Alfonso Ellis of [facility] who said that their [place] location [facility]) had all Medicaid beds in private rooms.  Pt is only agreeable to private room.  Per April, the only [facility] will pay for private room is if all the rooms at a facility are Medicaid room.     Alfonso Ellis called April Coble the regional manager she said that [facility] in [place] is all Medicaid and all private rooms.   April reported Kathy West from [facility] will call and then come by tomorrow morning to the ED to assess the patient, verify the pt's Medicaid number and she said if the patient is appropriate for a Medicaid room, then they usually are able to turn right around and admit them very quickly.  CSW will complete pt's FL-2 and have it signed by the EDP.  CSW updated Asst Mudlogger.  Alphonse Guild. Andrea Ferrer, Reed Pandy, CSI Clinical Social Worker Ph: [phone]

## 2016-11-17 NOTE — [facility] Suicide Risk Assessment (Signed)
Suicide Risk Assessment  Discharge Assessment   [facility] Discharge Suicide Risk Assessment   Principal Problem: Adjustment disorder with mixed disturbance of emotions and conduct Discharge Diagnoses:  Patient Active Problem List   Diagnosis Date Noted  . Adjustment disorder with mixed disturbance of emotions and conduct [F43.25] 11/17/2016    Priority: High  . 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 Lumbar back pain [M54.5] 02/04/2016  . AKI (acute kidney injury) (HCC) [N17.9] 01/08/2016  . Closed compression fracture of L3 lumbar vertebra (HCC) [S32.030A] 01/08/2016  . Pruritus [L29.9] 10/01/2015  . Acute maxillary sinusitis [J01.00] 06/03/2014  . Severe asthma [J45.998]   . Acute on chronic respiratory failure (HCC) [J96.20] 05/31/2014  . Chronic cough [R05] 05/31/2014  . Asthma with acute exacerbation [J45.901] 05/31/2014  . Chronic pain syndrome [G89.4] 05/31/2014  . Malnutrition of moderate degree (HCC) [E44.0] 05/30/2014  . Hypokalemia [E87.6] 05/28/2014  . SOB (shortness of breath) [R06.02] 05/28/2014  . Weakness of back [R29.898] 02/03/2014  . Weakness of both legs [R29.898] 02/03/2014  . Gait instability [R26.81] 12/01/2013  . Allergic contact dermatitis [L23.9] 12/24/2011  . 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 Wrist joint infection (HCC) [M00.9] 10/10/2011    Class: Diagnosis of  . Urinary retention [R33.9] 09/16/2011  . 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 TACHYCARDIA [R00.0] 07/16/2010  . BLINDNESS [H54.8] 10/27/2008  . Sinusitis, chronic [J32.9] 08/03/2007  . Allergic rhinitis [J30.9] 08/03/2007  . Asthma [J45.909] 08/03/2007  . Esophageal reflux [K21.9] 08/03/2007  . ECZEMA [L25.9] 08/03/2007  . OSTEOPENIA [M89.9, M94.9] 08/03/2007  . ULCERATIVE COLITIS, HX OF [Z87.19] 08/03/2007    Total Time spent with patient: 30 minutes   Musculoskeletal: Strength & Muscle Tone: within normal limits Gait &  Station: normal Patient leans: N/A  Psychiatric Specialty Exam: Physical Exam  Constitutional: He is oriented to person, place, and time. He appears well-developed and well-nourished.  HENT:  Head: Normocephalic.  Neck: Normal range of motion.  Respiratory: Effort normal.  Musculoskeletal: Normal range of motion.  Neurological: He is alert and oriented to person, place, and time.  Psychiatric: He has a normal mood and affect. His speech is normal and behavior is normal. Judgment and thought content normal. Cognition and memory are normal.    Review of Systems  Psychiatric/Behavioral: Negative.   All other systems reviewed and are negative.   Blood pressure 109/71, pulse 84, temperature 97.9 F (36.6 C), temperature source Oral, resp. rate 18, SpO2 98 %.There is no height or weight on file to calculate BMI.  General Appearance: Casual  Eye Contact:  Good  Speech:  Normal Rate  Volume:  WDL  Mood:  Euthymic  Affect:  Congruent  Thought Process:  Descriptions of Associations: Intact  Orientation:  Other:  person and place and time  Thought Content:  WDL, logical  Suicidal Thoughts:  No  Homicidal Thoughts:  No  Memory:  Immediate, good; recent, good; remote, good  Judgement:  Fair  Insight:  Fair  Psychomotor Activity:  Normal  Concentration:  Concentration and attention span:  Good  Recall:  Good  Fund of Knowledge:  Fair  Language:  Good  Akathisia:  No  Handed:  Right  AIMS (if indicated):     Assets:  Leisure Time Resilience Social Support  ADL's:  Intact  Cognition:  WDL  Sleep:      Mental Status Per Nursing Assessment::   On Admission:   suicidal/homicidal ideations  Demographic Factors:  Male, Age  65 or older and Caucasian  Loss Factors: NA  Historical Factors: NA  Risk Reduction Factors:   Sense of responsibility to family, Positive social support and Positive therapeutic relationship  Continued Clinical Symptoms:  None  Cognitive Features That  Contribute To Risk:  None    Suicide Risk:  Minimal: No identifiable suicidal ideation.  Patients presenting with no risk factors but with morbid ruminations; may be classified as minimal risk based on the severity of the depressive symptoms    Plan Of Care/Follow-up recommendations:  Activity:  as tolerated Diet:  heart healhty diet  Lance Galas, NP 11/17/2016, 11:11 AM

## 2016-11-18 ENCOUNTER — Emergency Department (HOSPITAL_COMMUNITY)
Admission: EM | Admit: 2016-11-18 | Discharge: 2016-11-18 | Disposition: A | Discharge status: Home / Self Care | Payer: Medicare Other | Source: Home / Self Care | Attending: Emergency Medicine | Admitting: Emergency Medicine

## 2016-11-18 ENCOUNTER — Emergency Department (HOSPITAL_COMMUNITY)
Admission: EM | Admit: 2016-11-18 | Discharge: 2016-11-18 | Disposition: A | Discharge status: Home / Self Care | Payer: Medicare Other | Attending: Emergency Medicine | Admitting: Emergency Medicine

## 2016-11-18 ENCOUNTER — Encounter (HOSPITAL_COMMUNITY): Payer: Self-pay

## 2016-11-18 ENCOUNTER — Encounter (HOSPITAL_COMMUNITY): Payer: Self-pay | Admitting: Family Medicine

## 2016-11-18 DIAGNOSIS — T148XXA Other injury of unspecified body region, initial encounter: Secondary | ICD-10-CM

## 2016-11-18 DIAGNOSIS — Z5900 Homelessness unspecified: Secondary | ICD-10-CM

## 2016-11-18 DIAGNOSIS — Y999 Unspecified external cause status: Secondary | ICD-10-CM

## 2016-11-18 DIAGNOSIS — W19XXXA Unspecified fall, initial encounter: Secondary | ICD-10-CM | POA: Insufficient documentation

## 2016-11-18 DIAGNOSIS — Y929 Unspecified place or not applicable: Secondary | ICD-10-CM

## 2016-11-18 DIAGNOSIS — Y939 Activity, unspecified: Secondary | ICD-10-CM

## 2016-11-18 DIAGNOSIS — Z742 Need for assistance at home and no other household member able to render care: Secondary | ICD-10-CM | POA: Diagnosis not present

## 2016-11-18 NOTE — Progress Notes (Addendum)
CSW spoke with Chiropodistassistant director and social work Interior and spatial designerdirector regarding patients discharge plans. Patient to be given bus pass, shelter resources, and SCAT application. CSW will provide patient with needed discharge information and bus pass. CSW will contact security if patient is unwilling to discharge.   CSW is signing off at this time- no further needs.   Stacy GardnerErin Verbon Giangregorio, LCSWA Clinical Social Worker [phone]
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
## 2016-11-18 NOTE — ED Notes (Signed)
[facility] Expected date:  Expected time:  Means of arrival:  Comments: EMS 66 yo male-needs an FL@ form-[facility]-unable to care for self-blind
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
## 2016-11-18 NOTE — ED Notes (Signed)
Social work at bedside.  
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
## 2016-11-18 NOTE — Progress Notes (Signed)
CSW attempted to complete PASSR but [place] MUST website stated some of the pt's information does not meet their information on the pt in their system.  CSW verified all pt's info correct with the pt then attempted variations of the pt's name and had no success.  Pt's middle name, per pt, is August SaucerDean but adding this was not successful in obtaining PASSR, not applying for PASSR. A call to [place] MUST during their office hours may be necessary.  Dorothe PeaJonathan F. Fatoumata Albaugh, Francesco SorLCSWA, LCAS, CSI Clinical Social Worker Ph: [phone]

## 2016-11-18 NOTE — ED Notes (Signed)
Bed: [facility] Expected date:  Expected time:  Means of arrival:  Comments: 

## 2016-11-18 NOTE — ED Notes (Signed)
Social worker at bedside.
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
## 2016-11-18 NOTE — Progress Notes (Addendum)
CSW provided patient with taxi voucher for patient to discharge to homeless shelter at this time.  Stacy GardnerErin Dymond Gutt, LCSWA Clinical Social Worker [phone]

## 2016-11-18 NOTE — ED Notes (Signed)
Patient requesting for more hydrocortisone cream to be applied.  When scanned medication states that off schedule due to being administered at 0702 this morning.  Explained to patient that instructions for medication to be administered are twice daily, so if I applied now, it wont be applied again until tomorrow.

## 2016-11-18 NOTE — ED Triage Notes (Signed)
[facility] by [facility]. Patient was discharged from this ED and given a taxi voucher for [facility]. Patient has declined any placement that the Social workers have obtained for him. Patient was dropped off onto the sidewalk in front of [facility] and patient was not allowed entrance. 911 was called and patient brought back to this facility.

## 2016-11-18 NOTE — ED Notes (Signed)
Patient dressed in clothes and tennis shoes provided by chaplin.  Patient walked out into hallway by following staff voice.  Patient back to room by voice of staff.  [employer] at bedside stating that patient is not safe to be transported to a shelter in his condition, as he cant fend for himself.   Tia AlertStacey Toben, ED Director in huddle with [employer] and working with Social worker to make further plans for patient at this time.

## 2016-11-18 NOTE — ED Notes (Signed)
Dr. Freida BusmanAllen, Jon from social work, Silvestre GunnerLaurie AC, off duty [employer] and security at bedside to speak with patient about living arrangements. Patient has declined all help over the last 2 days, patient will be discharged. Discharged with bus pass with off duty [employer] and Silvestre GunnerLaurie AC waiting at bus until patient boards bus.

## 2016-11-18 NOTE — Progress Notes (Signed)
CSW discussed case with discussed case with social work Interior and spatial designerdirector and patient is not to receive taxi vouchers at this time. Patient is to discharge with bus pass and shelter resources once medically cleared. CSW updated ED director, charge nurse, [facility], and 2nd shift CSW.   Stacy GardnerErin Adan Baehr, LCSWA Clinical Social Worker [phone]

## 2016-11-18 NOTE — ED Notes (Signed)
Come back from being off the unit with another patient.  [name] [name], AC, social worker and [employer] are in huddle about patient's discharge disposition.
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
## 2016-11-18 NOTE — Progress Notes (Addendum)
2nd shift CSW was informed by 1st shift CSW this CSW was not to provide pt with a taxt voucher but was, per CSW Director to provide pt with a shelter list and a bus pass.  Pt has continuously refused all placement options after accepting all placement options and then has repeatedly changed his mind after multiple placements has begun to be facilitated for the pt by the CSW Dept.  Due to pt has been agreeing to and then refusing all subsequent placement options, as a result, once pt is medically cleared the plan is to D/C pt.    Per 1st shift CSW [facility].  Please reconsult if future social work needs arise.  CSW signing off, as social work intervention is no longer needed.   [name] PeaJonathan F. Kasin [name], Francesco SorLCSWA, LCAS, CSI Clinical Social Worker Ph: [phone]

## 2016-11-18 NOTE — ED Provider Notes (Signed)
Pt was here earlier and already seen by Dr. Crista Curbana Liu for his R hand abrasion/injury around 3:45PM today; there was some error with his registration at that time (her note shows up under the 11/15/16 encounter, which is likely the error that occurred on registration's end; it appears they did not make a new encounter for the visit until AFTER she saw him and already wrote the note); she saw pt and already wrote a note at that time, had already discharged pt and given d/c instructions however registration was supposed to fix the mistake; apparently in the process of attempting to fix it, they took him off the board and didn't "re-register" him correctly until an hour later; at around 4:45pm he showed up as being registered under today's "new encounter" for the R hand injury that he was seen for at 3:45pm; he then was placed into FT room 5 and was listed as waiting to be seen; I was not informed of any of this, and was unaware on whether he was in fact waiting to be seen since there had already been a note written by Dr. Verdie MosherLiu from 1hr prior, hence I assumed he had been seen. At around 5:20pm he was still listed as waiting for provider, so I assigned myself to his name because I thought it was odd that he was still listed as waiting for provider when it appeared he'd already been seen (and at that time, I had no information about the registration issues), but I went to ask Dr. Verdie MosherLiu what exactly had happened since it seemed as though she had already written a note; she informed me that she had seen the pt and he was already ready for discharge, and that I did not have to see the pt as this was not a new visit, it was the prior visit that just had been incorrectly registered the first time. She has printed the AVS and given d/c instructions to the pt. Please see her notes for full documentation of today's visit. I DID NOT SEE PT DURING TODAY'S ENCOUNTER. PLEASE NOTE THAT HER DOCUMENTATION FOR TODAY'S VISIT IS FILED UNDER THE  11/15/16 ENCOUNTER, BUT IT'S ACTUALLY FOR TODAY's ENCOUNTER (11/18/16).       [address], [place], [place] 11/18/16 1744    Lavera GuiseLiu, Dana Duo, MD 11/19/16 [phone]

## 2016-11-18 NOTE — ED Provider Notes (Signed)
Blood pressure (!) 152/92, pulse 94, temperature 97.9 F (36.6 C), temperature source Oral, resp. rate 18, SpO2 100 %.  In short, [name] PattenRoger D [name] is a 66 y.o. male with a chief complaint of Suicidal and Aggressive Behavior .  Refer to the original H&P for additional details.  Rounding complete. Awaiting placement. Anticipate placement today.    Alona BeneJoshua Long, MD    Maia PlanLong, Joshua G, MD 11/18/16 [phone]

## 2016-11-18 NOTE — ED Notes (Signed)
Pt stated that "I do not want to talk to you."  Informed RN and CSW.

## 2016-11-18 NOTE — Progress Notes (Addendum)
CSW attempted to contact numerous shelters with no answer from any. CSW contacted the [facility] on [address] to see if pt would be able to get into their emergency shelter today, but  no answer. CSW will continue to assist with needs.      Claude MangesKierra S. Jammie Clink, MSW, LCSW-A Emergency Department Clinical Social Worker [phone]

## 2016-11-18 NOTE — ED Notes (Signed)
While this RN was with another patient, social work stated that she is working to get patient discharged out of ED. Informed her his discharge paperwork is in patient belonging bag at nurse's station.

## 2016-11-18 NOTE — Progress Notes (Signed)
CSW attempted to contact [facility] regional sales representative [facility] with no answer. CSW left voicemail for return call.   Stacy GardnerErin Naoki Migliaccio, LCSWA Clinical Social Worker [phone]

## 2016-11-18 NOTE — Discharge Instructions (Signed)
Keep wounds clean and dry. Return for worsening symptoms, including fever, worsening redness/swelling, or any other symptoms concerning to you.

## 2016-11-18 NOTE — ED Provider Notes (Signed)
[facility]-EMERGENCY DEPT Provider Note   CSN: [id number] Arrival date & time: 11/15/16  1440     History   Chief Complaint Chief Complaint  Patient presents with  . Suicidal  . Aggressive Behavior    HPI FRANKEY BOTTING is a 66 y.o. male.  HPI 66 year old male who presents after fall. Was just discharged from [facility] ED after prolonged ED stay for psych evaluation. Was cleared by psych for discharge this morning and given taxi voucher to homeless shelter. On his way out of taxi at [employer], lost balance and fell backwards, catching fall with the right hand. Did not hit head or have LOC. Sustained abrasion to the right hand and returned to ED. Denies any pain. Unknown last tetanus.   Past Medical History:  Diagnosis Date  . Anxiety   . Arthritis    spine  . Chronic lower back pain   . Contact dermatitis and other eczema, due to unspecified cause   . COPD (chronic obstructive pulmonary disease) (HCC)   . Cough   . Depression    patient constantly hitting left side of face "due to pain"  . Enlarged prostate   . Esophageal reflux   . H/O hiatal hernia   . Headache    PMH: migraines  . Hep C w/o coma, chronic (HCC) early 2013  . History of eye prosthesis   . History of kidney stones   . Hypertension    not on medication at this time  . 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 Multiple facial bone fractures (HCC)   . Multiple open wounds    "from esczema- scratching"  . Osteopenia   . Pancreatitis   . Pneumonia    in past had several times  . Seasonal allergies   . Ulcerative colitis   . Unspecified asthma(493.90)   . Unspecified sinusitis (chronic)     Patient Active Problem List   Diagnosis Date Noted  . 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 Major depressive disorder with psychotic features (HCC) 11/16/2016  . Lumbar back pain 02/04/2016  . AKI (acute kidney injury) (HCC) 01/08/2016  . Closed  compression fracture of L3 lumbar vertebra (HCC) 01/08/2016  . Pruritus 10/01/2015  . Acute maxillary sinusitis 06/03/2014  . Severe asthma   . Acute on chronic respiratory failure (HCC) 05/31/2014  . Chronic cough 05/31/2014  . Asthma with acute exacerbation 05/31/2014  . Chronic pain syndrome 05/31/2014  . Malnutrition of moderate degree (HCC) 05/30/2014  . Hypokalemia 05/28/2014  . SOB (shortness of breath) 05/28/2014  . Weakness of back 02/03/2014  . Weakness of both legs 02/03/2014  . Gait instability 12/01/2013  . Allergic contact dermatitis 12/24/2011  . 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 Wrist joint infection (HCC) 10/10/2011    Class: Diagnosis of  . Urinary retention 09/16/2011  . 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 TACHYCARDIA 07/16/2010  . BLINDNESS 10/27/2008  . Sinusitis, chronic 08/03/2007  . Allergic rhinitis 08/03/2007  . Asthma 08/03/2007  . Esophageal reflux 08/03/2007  . ECZEMA 08/03/2007  . OSTEOPENIA 08/03/2007  . ULCERATIVE COLITIS, HX OF 08/03/2007    Past Surgical History:  Procedure Laterality Date  . CARPAL TUNNEL RELEASE Right 06/15/2013   Procedure: RIGHT CARPAL TUNNEL RELEASE;  Surgeon: Marlowe Shores, MD;  Location: [facility];  Service: Orthopedics;  Laterality: Right;  . COLONOSCOPY W/ BIOPSIES AND POLYPECTOMY    . ENUCLEATION  Left   . EYE SURGERY     3 Corneal transplant (bilateral eyes).    . FRACTURE SURGERY    . HARDWARE REMOVAL  10/09/2011   Procedure: HARDWARE REMOVAL;  Surgeon: Nadara MustardMarcus V Duda, MD;  [facility];  Service: Orthopedics;  Laterality: Right;  Removal Deep Hardware Right Wrist, placement antibiotic beads.  . INGUINAL HERNIA REPAIR Left    age 66  . Litrotripsy    . metacarpal pinning Right    right hand  . NERVE, TENDON AND ARTERY REPAIR Right 12/05/2015   Procedure: RIGHT WRIST, HAND FLEXOR TENDON LENGTHENING;  Surgeon: Dairl PonderMatthew Weingold, MD;  Location: [facility] OR;  Service: Orthopedics;  Laterality:  Right;  . NEUROLYSIS OF MEDIAN NERVE Right 12/05/2015   Procedure: RIGHT MEDIAN NERVE NEUROLYSIS ;  Surgeon: Dairl PonderMatthew Weingold, MD;  [facility];  Service: Orthopedics;  Laterality: Right;  . ORIF DISTAL RADIUS FRACTURE  09/12/11   right  . ORIF WRIST FRACTURE Right 06/15/2013   Procedure: RIGHT RADIUS PSTEOTOMY, DISTAL ULNA RESECTION, AND DIGITAL FLEXOR LENGTHENING AS NEEDED;  Surgeon: Marlowe ShoresMatthew A Weingold, MD;  Location: [facility];  Service: Orthopedics;  Laterality: Right;  . PATELLA FRACTURE SURGERY     right  . RADIOLOGY WITH ANESTHESIA N/A 03/09/2014   Procedure: MRI - Cervical and Lumbar without Contrast;  Surgeon: Medication Radiologist, MD;  Location: [facility] OR;  Service: Radiology;  Laterality: N/A;  . RADIOLOGY WITH ANESTHESIA N/A 04/03/2015   Procedure: MRI OF BRAIN    (RADIOLOGY WITH ANESTHESIA);  Surgeon: Medication Radiologist, MD;  Location: [facility] OR;  Service: Radiology;  Laterality: N/A;  . TONSILLECTOMY AND ADENOIDECTOMY         Home Medications    Prior to Admission medications   Medication Sig Start Date End Date Taking? Authorizing Provider  PROAIR HFA 108 (90 Base) MCG/ACT inhaler INHALE 2 PUFFS INTO THE LUNGS EVERY 6 HOURS AS NEEDED FOR SHORTNESS OF BREATH 02/29/16  Yes Mannam, Praveen, MD  albuterol (PROVENTIL HFA;VENTOLIN HFA) 108 (90 Base) MCG/ACT inhaler Inhale 2 puffs into the lungs every 6 (six) hours as needed for wheezing or shortness of breath. 08/13/16   Mannam, Praveen, MD  albuterol (PROVENTIL) (2.5 MG/3ML) 0.083% nebulizer solution Take 3 mLs (2.5 mg total) by nebulization every 4 (four) hours as needed for wheezing or shortness of breath. 04/17/15   Parrett, Virgel Bouquetammy S, NP  bisacodyl (DULCOLAX) 10 MG suppository Place 1 suppository (10 mg total) rectally as needed for moderate constipation. 01/11/16   Calvert Cantorizwan, Saima, MD  diazepam (VALIUM) 10 MG tablet Take 1 tablet (10 mg total) by mouth 3 (three) times daily. 06/06/14   Osvaldo ShipperKrishnan, Gokul, MD  DULERA  200-5 MCG/ACT AERO INHALE 2 PUFFS INTO THE LUNGS TWICE A DAY Patient not taking: Reported on 11/04/2016 04/15/16   Chilton GreathouseMannam, Praveen, MD  EPINEPHrine (EPIPEN 2-PAK) 0.3 mg/0.3 mL IJ SOAJ injection Inject 0.3 mg into the muscle once as needed (for severe allergic reaction).    [provider]  fluticasone (FLONASE) 50 MCG/ACT nasal spray USE 2 SPRAYS IN EACH NOSTRIL TWICE DAILY. 05/08/16   Mannam, Colbert CoyerPraveen, MD  gabapentin (NEURONTIN) 100 MG capsule Take 2 capsules (200 mg total) by mouth 2 (two) times daily. 11/17/16   Charm RingsLord, Jamison Y, NP  hydrOXYzine (ATARAX/VISTARIL) 25 MG tablet Take 1 tablet (25 mg total) by mouth every 6 (six) hours as needed for itching. 11/17/16   Charm RingsLord, Jamison Y, NP  mometasone-formoterol (DULERA) 200-5 MCG/ACT AERO 2 puffs inhaled twice daily. Needs office  visit for future refills. Patient taking differently: Inhale 2 puffs into the lungs 2 (two) times daily as needed for wheezing or shortness of breath.  06/03/16   Mannam, Colbert Coyer, MD  predniSONE (DELTASONE) 20 MG tablet Take 1 tablet (20 mg total) by mouth 2 (two) times daily with a meal. 11/17/16   Charm Rings, NP  sertraline (ZOLOFT) 25 MG tablet Take 1 tablet (25 mg total) by mouth daily. 11/18/16   Charm Rings, NP    Family History Family History  Problem Relation Age of Onset  . Allergies Mother        atopic  . Asthma Mother   . Heart failure Mother        CHF  . Other Father   . Anesthesia problems Neg Hx     Social History Social History  Substance Use Topics  . Smoking status: Former Smoker    Packs/day: 1.00    Years: 3.00    Types: Cigarettes, Pipe    Quit date: 05/20/1983  . Smokeless tobacco: Never Used  . Alcohol use Yes     Comment: "scotch once a year"     Allergies   Bacitracin-polymyxin b; Bee venom; Codeine; Norco [hydrocodone-acetaminophen]; Penicillins; Shellfish-derived products; Other; Tape; Chlorhexidine; and Sulfate   Review of Systems Review of Systems  Respiratory:  Negative for shortness of breath.   Cardiovascular: Negative for chest pain.  Gastrointestinal: Negative for abdominal pain.  Musculoskeletal: Negative for back pain and neck pain.  Neurological: Negative for syncope and headaches.  Hematological: Does not bruise/bleed easily.  Psychiatric/Behavioral: Negative for confusion.     Physical Exam Updated Vital Signs BP (!) 152/92   Pulse 94   Temp 97.9 F (36.6 C) (Oral)   Resp 18   SpO2 100%   Physical Exam Physical Exam  Nursing note and vitals reviewed. Constitutional: Well developed, well nourished, non-toxic, and in no acute distress Head: Normocephalic and atraumatic.  Mouth/Throat: Oropharynx is clear and moist.  Neck: Normal range of motion. Neck supple.  Cardiovascular: Normal rate and regular rhythm.   Pulmonary/Chest: Effort normal and breath sounds normal.  Abdominal: Soft. There is no tenderness. There is no rebound and no guarding.  Musculoskeletal: Normal range of motion of all extremities. Superficial abrasion over right MCP joint. No deformities. Limited ROm of right hand at MCP joints for all digits due to contracture (baseline) Neurological: Alert, no facial droop, fluent speech, moves all extremities purposefully, sensation to  Light touch in tact throughout Skin: Skin is warm and dry.  Psychiatric: Cooperative   ED Treatments / Results  Labs (all labs ordered are listed, but only abnormal results are displayed) Labs Reviewed  COMPREHENSIVE METABOLIC PANEL - Abnormal; Notable for the following:       Result Value   Potassium 3.4 (*)    Chloride 114 (*)    Glucose, Bld 143 (*)    Creatinine, Ser 1.31 (*)    GFR calc non Af Amer 56 (*)    All other components within normal limits  CBC WITH DIFFERENTIAL/PLATELET - Abnormal; Notable for the following:    HCT 38.8 (*)    Eosinophils Absolute 1.3 (*)    All other components within normal limits  ACETAMINOPHEN LEVEL - Abnormal; Notable for the following:      Acetaminophen (Tylenol), Serum <10 (*)    All other components within normal limits  RAPID URINE DRUG SCREEN, HOSP PERFORMED - Abnormal; Notable for the following:    Benzodiazepines POSITIVE (*)  All other components within normal limits  ETHANOL  SALICYLATE LEVEL  URINALYSIS, ROUTINE W REFLEX MICROSCOPIC    EKG  EKG Interpretation  Date/Time:  Sunday November 16 2016 11:49:02 EDT Ventricular Rate:  93 PR Interval:    QRS Duration: 84 QT Interval:  357 QTC Calculation: 444 R Axis:   -28 Text Interpretation:  Sinus rhythm Borderline left axis deviation Confirmed by Rubin Payor  MD, NATHAN [phone]) on 11/16/2016 11:54:50 AM       Radiology No results found.  Procedures Procedures (including critical care time)  Medications Ordered in ED Medications  gabapentin (NEURONTIN) capsule 200 mg (200 mg Oral Refused 11/18/16 0503)  sertraline (ZOLOFT) tablet 25 mg (25 mg Oral Given 11/17/16 1027)  hydrocortisone cream 1 % ( Topical Given 11/18/16 0702)  predniSONE (DELTASONE) tablet 20 mg (20 mg Oral Given 11/18/16 0802)  hydrOXYzine (ATARAX/VISTARIL) tablet 25 mg (25 mg Oral Given 11/18/16 0802)  ziprasidone (GEODON) injection 10 mg (10 mg Intramuscular Given 11/15/16 2229)  sterile water (preservative free) injection (  Given 11/15/16 2228)  LORazepam (ATIVAN) injection 2 mg (2 mg Intramuscular Given 11/16/16 0358)  diphenhydrAMINE (BENADRYL) capsule 25 mg (25 mg Oral Given 11/16/16 1803)  predniSONE (DELTASONE) tablet 60 mg (60 mg Oral Given 11/17/16 1031)     Initial Impression / Assessment and Plan / ED Course  I have reviewed the triage vital signs and the nursing notes.  Pertinent labs & imaging results that were available during my care of the patient were reviewed by me and considered in my medical decision making (see chart for details).     Tetanus updated. With superficial abrasion to the right hand. No deformities. No pain. No other injuries. Stable for discharge.   Final Clinical  Impressions(s) / ED Diagnoses   Final diagnoses:  Eczema, unspecified type  Adjustment disorder with mixed disturbance of emotions and conduct  Skin abrasion     Lavera Guise, MD 11/18/16 1547

## 2016-11-25 ENCOUNTER — Emergency Department (HOSPITAL_COMMUNITY)
Admission: EM | Admit: 2016-11-25 | Discharge: 2016-11-25 | Disposition: A | Discharge status: Home / Self Care | Payer: Medicare Other | Attending: Emergency Medicine | Admitting: Emergency Medicine

## 2016-11-25 ENCOUNTER — Encounter (HOSPITAL_COMMUNITY): Payer: Self-pay

## 2016-11-25 DIAGNOSIS — Z79899 Other long term (current) drug therapy: Secondary | ICD-10-CM | POA: Insufficient documentation

## 2016-11-25 DIAGNOSIS — L309 Dermatitis, unspecified: Secondary | ICD-10-CM | POA: Diagnosis not present

## 2016-11-25 DIAGNOSIS — J45909 Unspecified asthma, uncomplicated: Secondary | ICD-10-CM | POA: Insufficient documentation

## 2016-11-25 DIAGNOSIS — J449 Chronic obstructive pulmonary disease, unspecified: Secondary | ICD-10-CM | POA: Diagnosis not present

## 2016-11-25 DIAGNOSIS — Z87891 Personal history of nicotine dependence: Secondary | ICD-10-CM | POA: Diagnosis not present

## 2016-11-25 DIAGNOSIS — I10 Essential (primary) hypertension: Secondary | ICD-10-CM | POA: Diagnosis not present

## 2016-11-25 DIAGNOSIS — R21 Rash and other nonspecific skin eruption: Secondary | ICD-10-CM | POA: Diagnosis present

## 2016-11-25 MED ORDER — PREDNISONE 20 MG PO TABS: 40.0000 mg | ORAL_TABLET | Freq: Every day | ORAL | 0 refills | Status: DC

## 2016-11-25 NOTE — ED Triage Notes (Signed)
Per EMS- Patient was at [facility]. Patient c/o itching and rash all over. EMS reported that the patient wanted to go to a dermatologist, but were unable to transport to a physician's office. EMS reported that [facility] stated that they would allow him to come back but for only 1 more time.

## 2016-11-25 NOTE — ED Notes (Signed)
Pt reports the ambulance took him to [facility].  However, this Clinical research associatewriter just spoke with [employer] supervisor and they cannot take him there.  Will consult Social Work.

## 2016-11-25 NOTE — ED Notes (Signed)
Patient states that his itching started 3 to 4 days ago and he denies any changes in clothing, or medications.  Patient has not showered in 3 days.

## 2016-11-25 NOTE — ED Provider Notes (Signed)
[facility]-EMERGENCY DEPT Provider Note   CSN: [id number] Arrival date & time: 11/25/16  [id number]     History   Chief Complaint Chief Complaint  Patient presents with  . Rash  . Pruritis    HPI [name] [name] is a 66 y.o. male.  HPI patient presents with pruritic rash. States he has had since he's been 66 years old. Has a dermatologist and states that he's been told that is atopic eczema. Has been on some steroids. States the itching got worse. No fevers. It is all over.  Past Medical History:  Diagnosis Date  . Anxiety   . Arthritis    spine  . Chronic lower back pain   . Contact dermatitis and other eczema, due to unspecified cause   . COPD (chronic obstructive pulmonary disease) (HCC)   . Cough   . Depression    patient constantly hitting left side of face "due to pain"  . Enlarged prostate   . Esophageal reflux   . H/O hiatal hernia   . Headache    PMH: migraines  . Hep C w/o coma, chronic (HCC) early 2013  . History of eye prosthesis   . History of kidney stones   . Hypertension    not on medication at this time  . 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 Multiple facial bone fractures (HCC)   . Multiple open wounds    "from esczema- scratching"  . Osteopenia   . Pancreatitis   . Pneumonia    in past had several times  . Seasonal allergies   . Ulcerative colitis   . Unspecified asthma(493.90)   . Unspecified sinusitis (chronic)     Patient Active Problem List   Diagnosis Date Noted  . 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 Major depressive disorder with psychotic features (HCC) 11/16/2016  . Lumbar back pain 02/04/2016  . AKI (acute kidney injury) (HCC) 01/08/2016  . Closed compression fracture of L3 lumbar vertebra (HCC) 01/08/2016  . Pruritus 10/01/2015  . Acute maxillary sinusitis 06/03/2014  . Severe asthma   . Acute on chronic respiratory failure (HCC) 05/31/2014  . Chronic cough  05/31/2014  . Asthma with acute exacerbation 05/31/2014  . Chronic pain syndrome 05/31/2014  . Malnutrition of moderate degree (HCC) 05/30/2014  . Hypokalemia 05/28/2014  . SOB (shortness of breath) 05/28/2014  . Weakness of back 02/03/2014  . Weakness of both legs 02/03/2014  . Gait instability 12/01/2013  . Allergic contact dermatitis 12/24/2011  . 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 Wrist joint infection (HCC) 10/10/2011    Class: Diagnosis of  . Urinary retention 09/16/2011  . 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 TACHYCARDIA 07/16/2010  . BLINDNESS 10/27/2008  . Sinusitis, chronic 08/03/2007  . Allergic rhinitis 08/03/2007  . Asthma 08/03/2007  . Esophageal reflux 08/03/2007  . ECZEMA 08/03/2007  . OSTEOPENIA 08/03/2007  . ULCERATIVE COLITIS, HX OF 08/03/2007    Past Surgical History:  Procedure Laterality Date  . CARPAL TUNNEL RELEASE Right 06/15/2013   Procedure: RIGHT CARPAL TUNNEL RELEASE;  Surgeon: Marlowe ShoresMatthew A Weingold, MD;  Location: [facility];  Service: Orthopedics;  Laterality: Right;  . COLONOSCOPY W/ BIOPSIES AND POLYPECTOMY    . ENUCLEATION Left   . EYE SURGERY     3 Corneal transplant (bilateral eyes).    . FRACTURE SURGERY    . HARDWARE REMOVAL  10/09/2011   Procedure: HARDWARE REMOVAL;  Surgeon: Berna SpareMarcus  Kandis Mannan, MD;  Location: [facility] OR;  Service: Orthopedics;  Laterality: Right;  Removal Deep Hardware Right Wrist, placement antibiotic beads.  . INGUINAL HERNIA REPAIR Left    age 3  . Litrotripsy    . metacarpal pinning Right    right hand  . NERVE, TENDON AND ARTERY REPAIR Right 12/05/2015   Procedure: RIGHT WRIST, HAND FLEXOR TENDON LENGTHENING;  Surgeon: Dairl Ponder, MD;  Location: [facility] OR;  Service: Orthopedics;  Laterality: Right;  . NEUROLYSIS OF MEDIAN NERVE Right 12/05/2015   Procedure: RIGHT MEDIAN NERVE NEUROLYSIS ;  Surgeon: Dairl Ponder, MD;  Location: [facility] OR;  Service: Orthopedics;  Laterality: Right;  . ORIF DISTAL RADIUS  FRACTURE  09/12/11   right  . ORIF WRIST FRACTURE Right 06/15/2013   Procedure: RIGHT RADIUS PSTEOTOMY, DISTAL ULNA RESECTION, AND DIGITAL FLEXOR LENGTHENING AS NEEDED;  Surgeon: Marlowe Shores, MD;  Location: [facility];  Service: Orthopedics;  Laterality: Right;  . PATELLA FRACTURE SURGERY     right  . RADIOLOGY WITH ANESTHESIA N/A 03/09/2014   Procedure: MRI - Cervical and Lumbar without Contrast;  Surgeon: Medication Radiologist, MD;  Location: [facility] OR;  Service: Radiology;  Laterality: N/A;  . RADIOLOGY WITH ANESTHESIA N/A 04/03/2015   Procedure: MRI OF BRAIN    (RADIOLOGY WITH ANESTHESIA);  Surgeon: Medication Radiologist, MD;  Location: [facility] OR;  Service: Radiology;  Laterality: N/A;  . TONSILLECTOMY AND ADENOIDECTOMY         Home Medications    Prior to Admission medications   Medication Sig Start Date End Date Taking? Authorizing Provider  albuterol (PROVENTIL HFA;VENTOLIN HFA) 108 (90 Base) MCG/ACT inhaler Inhale 2 puffs into the lungs every 6 (six) hours as needed for wheezing or shortness of breath. 08/13/16   Mannam, Praveen, MD  albuterol (PROVENTIL) (2.5 MG/3ML) 0.083% nebulizer solution Take 3 mLs (2.5 mg total) by nebulization every 4 (four) hours as needed for wheezing or shortness of breath. 04/17/15   Parrett, Virgel Bouquet, NP  bisacodyl (DULCOLAX) 10 MG suppository Place 1 suppository (10 mg total) rectally as needed for moderate constipation. 01/11/16   Calvert Cantor, MD  diazepam (VALIUM) 10 MG tablet Take 1 tablet (10 mg total) by mouth 3 (three) times daily. 06/06/14   Osvaldo Shipper, MD  DULERA 200-5 MCG/ACT AERO INHALE 2 PUFFS INTO THE LUNGS TWICE A DAY Patient not taking: Reported on 11/04/2016 04/15/16   Chilton Greathouse, MD  EPINEPHrine (EPIPEN 2-PAK) 0.3 mg/0.3 mL IJ SOAJ injection Inject 0.3 mg into the muscle once as needed (for severe allergic reaction).    [provider]  fluticasone (FLONASE) 50 MCG/ACT nasal spray USE 2 SPRAYS IN EACH  NOSTRIL TWICE DAILY. 05/08/16   Mannam, Colbert Coyer, MD  gabapentin (NEURONTIN) 100 MG capsule Take 2 capsules (200 mg total) by mouth 2 (two) times daily. 11/17/16   Charm Rings, NP  hydrOXYzine (ATARAX/VISTARIL) 25 MG tablet Take 1 tablet (25 mg total) by mouth every 6 (six) hours as needed for itching. 11/17/16   Charm Rings, NP  mometasone-formoterol (DULERA) 200-5 MCG/ACT AERO 2 puffs inhaled twice daily. Needs office visit for future refills. Patient taking differently: Inhale 2 puffs into the lungs 2 (two) times daily as needed for wheezing or shortness of breath.  06/03/16   Mannam, Colbert Coyer, MD  predniSONE (DELTASONE) 20 MG tablet Take 2 tablets (40 mg total) by mouth daily. 11/25/16   Benjiman Core, MD  PROAIR HFA 108 [phone] Base) MCG/ACT inhaler INHALE 2 PUFFS INTO THE  LUNGS EVERY 6 HOURS AS NEEDED FOR SHORTNESS OF BREATH 02/29/16   Mannam, Colbert Coyer, MD  sertraline (ZOLOFT) 25 MG tablet Take 1 tablet (25 mg total) by mouth daily. 11/18/16   Charm Rings, NP    Family History Family History  Problem Relation Age of Onset  . Allergies Mother        atopic  . Asthma Mother   . Heart failure Mother        CHF  . Other Father   . Anesthesia problems Neg Hx     Social History Social History  Substance Use Topics  . Smoking status: Former Smoker    Packs/day: 1.00    Years: 3.00    Types: Cigarettes, Pipe    Quit date: 05/20/1983  . Smokeless tobacco: Never Used  . Alcohol use Yes     Comment: "scotch once a year"     Allergies   Bacitracin-polymyxin b; Bee venom; Codeine; Norco [hydrocodone-acetaminophen]; Penicillins; Shellfish-derived products; Other; Tape; Chlorhexidine; and Sulfate   Review of Systems Review of Systems  Constitutional: Negative for appetite change and fever.  Respiratory: Negative for shortness of breath.   Cardiovascular: Negative for chest pain.  Gastrointestinal: Negative for abdominal pain.  Skin: Positive for rash.  Neurological: Negative for  syncope.     Physical Exam Updated Vital Signs BP (!) 148/74 (BP Location: Right Arm)   Pulse 88   Temp 98.4 F (36.9 C) (Oral)   Resp 16   Ht 5\' 11"  (1.803 m)   Wt 54.4 kg (120 lb)   SpO2 98%   BMI 16.74 kg/m   Physical Exam  Constitutional: He appears well-developed.  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  Cardiovascular: Normal rate.   Musculoskeletal: He exhibits no edema.  Neurological: He is alert.  Skin: Skin is warm. Capillary refill takes less than 2 seconds.  Dry skin diffusely. Multiple excoriations and raised areas over most of his body.     ED Treatments / Results  Labs (all labs ordered are listed, but only abnormal results are displayed) Labs Reviewed - No data to display  EKG  EKG Interpretation None       Radiology No results found.  Procedures Procedures (including critical care time)  Medications Ordered in ED Medications - No data to display   Initial Impression / Assessment and Plan / ED Course  I have reviewed the triage vital signs and the nursing notes.  Pertinent labs & imaging results that were available during my care of the patient were reviewed by me and considered in my medical decision making (see chart for details).     Patient with acute on chronic rash. States that the homeless shelter would not let him go to his dermatologist. States he was told to come here. Similar rash prior. Will give steroids and discharge.  Final Clinical Impressions(s) / ED Diagnoses   Final diagnoses:  Eczema, unspecified type    New Prescriptions New Prescriptions   PREDNISONE (DELTASONE) 20 MG TABLET    Take 2 tablets (40 mg total) by mouth daily.     Benjiman Core, MD 11/25/16 [phone]

## 2016-11-25 NOTE — ED Notes (Signed)
Bed: [facility] Expected date:  Expected time:  Means of arrival:  Comments: 

## 2016-11-25 NOTE — Discharge Instructions (Signed)
-   follow up with your dermatologist

## 2016-11-25 NOTE — ED Notes (Signed)
Instructed to DC patient with security and have security take patient to bus stop and place on bus and tell the [facility] driver [facility]

## 2016-11-26 ENCOUNTER — Encounter (HOSPITAL_COMMUNITY): Payer: Self-pay | Admitting: Emergency Medicine

## 2016-11-26 ENCOUNTER — Emergency Department (HOSPITAL_COMMUNITY)
Admission: EM | Admit: 2016-11-26 | Discharge: 2016-11-27 | Disposition: A | Discharge status: Home / Self Care | Payer: Medicare Other | Attending: Physician Assistant | Admitting: Physician Assistant

## 2016-11-26 DIAGNOSIS — Z885 Allergy status to narcotic agent status: Secondary | ICD-10-CM | POA: Diagnosis not present

## 2016-11-26 DIAGNOSIS — L309 Dermatitis, unspecified: Secondary | ICD-10-CM

## 2016-11-26 DIAGNOSIS — Z91030 Bee allergy status: Secondary | ICD-10-CM | POA: Diagnosis not present

## 2016-11-26 DIAGNOSIS — J45901 Unspecified asthma with (acute) exacerbation: Secondary | ICD-10-CM | POA: Insufficient documentation

## 2016-11-26 DIAGNOSIS — J449 Chronic obstructive pulmonary disease, unspecified: Secondary | ICD-10-CM | POA: Diagnosis not present

## 2016-11-26 DIAGNOSIS — I10 Essential (primary) hypertension: Secondary | ICD-10-CM | POA: Diagnosis not present

## 2016-11-26 DIAGNOSIS — F17210 Nicotine dependence, cigarettes, uncomplicated: Secondary | ICD-10-CM | POA: Diagnosis not present

## 2016-11-26 DIAGNOSIS — Z880 Allergy status to penicillin: Secondary | ICD-10-CM | POA: Diagnosis not present

## 2016-11-26 DIAGNOSIS — R21 Rash and other nonspecific skin eruption: Secondary | ICD-10-CM | POA: Diagnosis present

## 2016-11-26 DIAGNOSIS — Z91013 Allergy to seafood: Secondary | ICD-10-CM | POA: Insufficient documentation

## 2016-11-26 DIAGNOSIS — Z79899 Other long term (current) drug therapy: Secondary | ICD-10-CM | POA: Diagnosis not present

## 2016-11-26 MED ORDER — HYDROXYZINE HCL 25 MG PO TABS
25.0000 mg | ORAL_TABLET | Freq: Four times a day (QID) | ORAL | Status: DC | PRN
  Administered 2016-11-26: 25 mg via ORAL
  Filled 2016-11-26: qty 1

## 2016-11-26 MED ORDER — SERTRALINE HCL 50 MG PO TABS
25.0000 mg | ORAL_TABLET | Freq: Every day | ORAL | Status: DC
  Administered 2016-11-26: 25 mg via ORAL
  Filled 2016-11-26: qty 1

## 2016-11-26 MED ORDER — MOMETASONE FURO-FORMOTEROL FUM 200-5 MCG/ACT IN AERO: 2.0000 | INHALATION_SPRAY | Freq: Two times a day (BID) | RESPIRATORY_TRACT | Status: DC | PRN

## 2016-11-26 MED ORDER — FLUTICASONE PROPIONATE 50 MCG/ACT NA SUSP
2.0000 | Freq: Two times a day (BID) | NASAL | Status: DC
  Filled 2016-11-26: qty 16

## 2016-11-26 MED ORDER — GABAPENTIN 100 MG PO CAPS
200.0000 mg | ORAL_CAPSULE | Freq: Two times a day (BID) | ORAL | Status: DC
  Administered 2016-11-26: 200 mg via ORAL
  Filled 2016-11-26: qty 2

## 2016-11-26 MED ORDER — DIAZEPAM 5 MG PO TABS
10.0000 mg | ORAL_TABLET | Freq: Three times a day (TID) | ORAL | Status: DC
  Administered 2016-11-26: 10 mg via ORAL
  Filled 2016-11-26: qty 2

## 2016-11-26 MED ORDER — ALBUTEROL SULFATE HFA 108 (90 BASE) MCG/ACT IN AERS: 2.0000 | INHALATION_SPRAY | Freq: Four times a day (QID) | RESPIRATORY_TRACT | Status: DC | PRN
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
## 2016-11-26 NOTE — Progress Notes (Addendum)
CSW and PA spoke with pt at bedside to discuss pt needs. Pt mentions that pt has been staying at [facility]. CSW provided nurse Tobi Bastos(Anna) with bus pass for [facility] to [facility] once medically stable for discharge.        Claude MangesKierra S. Damaree Sargent, MSW, LCSW-A Emergency Department Clinical Social Worker [phone]

## 2016-11-26 NOTE — ED Notes (Signed)
Got patient a coke patient is resting in a hall way bed

## 2016-11-26 NOTE — Discharge Instructions (Signed)
Please follow with your primary care doctor in the next 2 days for a check-up. They must obtain records for further management.  ° °Do not hesitate to return to the Emergency Department for any new, worsening or concerning symptoms.  ° °

## 2016-11-27 DIAGNOSIS — L309 Dermatitis, unspecified: Secondary | ICD-10-CM | POA: Diagnosis not present

## 2016-11-27 NOTE — ED Notes (Signed)
Social worker into speak to pt, states was left note that pt was going home with friend today, friend has no phone #. Pt awake speaking to nurse

## 2016-11-27 NOTE — Care Management Note (Signed)
Case Management Note  Patient Details  Name: [name] [name] MRN: [id number] Date of Birth: 09-29-1950  Subjective/Objective:                    Action/Plan:   Expected Discharge Date:                  Expected Discharge Plan:     In-House Referral:     Discharge planning Services     Post Acute Care Choice:    Choice offered to:     DME Arranged:    DME Agency:     HH Arranged:    HH Agency:     Status of Service:     If discussed at [facility] Length of Stay Meetings, dates discussed:    Additional Comments:  Oletta CohnWood, Ardis Lawley, RN 11/27/2016, 8:48 AM

## 2016-11-27 NOTE — ED Notes (Signed)
Pt does not remember phone number of his friend mark maxwell. Pt is ready for discharge but has no ride at the moment.

## 2016-11-27 NOTE — ED Notes (Signed)
Pt verbalized understanding of d/c instructions and has no further questions. Pt provided with bus pass to go home with. VSS.

## 2016-11-27 NOTE — ED Notes (Signed)
Pt is free to go wherever he chooses. Pt declined all services that we have to offer. This RN spoke with CSW again and this is what she stated. Pt escorted to bus stop in wheelchair and sitting on bench waiting for bus.

## 2016-11-27 NOTE — ED Notes (Signed)
Regular diet ordered for Lunch. 

## 2016-11-27 NOTE — ED Provider Notes (Signed)
Blood pressure 131/74, pulse 68, temperature 98.1 F (36.7 C), temperature source Oral, resp. rate 18, height 5\' 11"  (1.803 m), weight 54.4 kg (120 lb), SpO2 100 %.  In short, [name] [name] is a 66 y.o. male with a chief complaint of Rash .  Refer to the original H&P for additional details.  Spoke with social work regarding the patient's disposition. He has refused all attempts at placement or additional support. Patient was given a bus pass and will be discharged.  Alona BeneJoshua Kamron Portee, MD    Maia PlanLong, Ojas Coone G, MD 11/27/16 1028

## 2016-11-27 NOTE — Evaluation (Signed)
Physical Therapy Evaluation Patient Details Name: [name] PattenRoger D [name] MRN: [id number] DOB: 02/17/1951 Today's Date: 11/27/2016   History of Present Illness  Pt adm from homeless shelter with itching due to eczema. PMH - total blindness, multiple surgeries on RUE for fx and nerve injury, copd, anxiety, depression, L3 fx, chronic back pain  Clinical Impression  Pt presents to PT close to baseline mobility. Reports he lost his "blind" cane that he typically uses when ambulating.    Follow Up Recommendations No PT follow up    Equipment Recommendations  Other (comment) (Pt reports he lost "blind" cane and needs a new one.)    Recommendations for Other Services       Precautions / Restrictions Precautions Precautions: Fall Restrictions Weight Bearing Restrictions: No      Mobility  Bed Mobility Overal bed mobility: Modified Independent                Transfers Overall transfer level: Modified independent Equipment used: None             General transfer comment: Slow to rise and tremulous  Ambulation/Gait Ambulation/Gait assistance: Min guard Ambulation Distance (Feet): 75 Feet Assistive device: 1 person hand held assist Gait Pattern/deviations: Step-through pattern;Decreased step length - right;Decreased step length - left;Shuffle Gait velocity: decr Gait velocity interpretation: Below normal speed for age/gender General Gait Details: Assist for guidance due to pt blindness and unfamiliar environment.   Stairs            Wheelchair Mobility    Modified Rankin (Stroke Patients Only)       Balance Overall balance assessment: Needs assistance Sitting-balance support: No upper extremity supported;Feet supported Sitting balance-Leahy Scale: Normal     Standing balance support: No upper extremity supported Standing balance-Leahy Scale: Fair                               Pertinent Vitals/Pain Pain Assessment: Faces Faces Pain Scale:  Hurts little more Pain Location: rt heel Pain Descriptors / Indicators: Grimacing;Guarding Pain Intervention(s): Limited activity within patient's tolerance;Monitored during session    Home Living Family/patient expects to be discharged to:: Shelter/Homeless                 Additional Comments: Says he is going to stay with a friend    Prior Function Level of Independence: Needs assistance   Gait / Transfers Assistance Needed: Amb with blind cane           Hand Dominance   Dominant Hand: Right    Extremity/Trunk Assessment   Upper Extremity Assessment Upper Extremity Assessment: RUE deficits/detail;Generalized weakness RUE Deficits / Details: History of nerve injury with resulting decr function    Lower Extremity Assessment Lower Extremity Assessment: Generalized weakness       Communication   Communication: No difficulties  Cognition Arousal/Alertness: Awake/alert Behavior During Therapy: WFL for tasks assessed/performed Overall Cognitive Status: Within Functional Limits for tasks assessed                                        General Comments      Exercises     Assessment/Plan    PT Assessment Patent does not need any further PT services  PT Problem List         PT Treatment Interventions      PT Goals (  Current goals can be found in the Care Plan section)  Acute Rehab PT Goals PT Goal Formulation: All assessment and education complete, DC therapy    Frequency     Barriers to discharge        Co-evaluation               AM-PAC PT "6 Clicks" Daily Activity  Outcome Measure Difficulty turning over in bed (including adjusting bedclothes, sheets and blankets)?: None Difficulty moving from lying on back to sitting on the side of the bed? : None Difficulty sitting down on and standing up from a chair with arms (e.g., wheelchair, bedside commode, etc,.)?: None Help needed moving to and from a bed to chair (including a  wheelchair)?: A Little Help needed walking in hospital room?: A Little Help needed climbing 3-5 steps with a railing? : A Little 6 Click Score: 21    End of Session   Activity Tolerance: Patient tolerated treatment well Patient left: in bed;with call bell/phone within reach Nurse Communication: Mobility status PT Visit Diagnosis: Unsteadiness on feet (R26.81)    Time: 1610-9604 PT Time Calculation (min) (ACUTE ONLY): 17 min   Charges:   PT Evaluation $PT Eval Low Complexity: 1 Procedure     PT G Codes:   PT G-Codes **NOT FOR INPATIENT CLASS** Functional Assessment Tool Used: AM-PAC 6 Clicks Basic Mobility Functional Limitation: Mobility: Walking and moving around Mobility: Walking and Moving Around Current Status (V4098): At least 20 percent but less than 40 percent impaired, limited or restricted Mobility: Walking and Moving Around Goal Status [id number]): At least 20 percent but less than 40 percent impaired, limited or restricted Mobility: Walking and Moving Around Discharge Status [id number]): At least 20 percent but less than 40 percent impaired, limited or restricted    [facility] PT [phone]   Angelina Ok [facility] 11/27/2016, 12:32 PM

## 2016-11-27 NOTE — Progress Notes (Signed)
CSW spoke with pt at bedside where pt notified CSW that pt is to go home today with a friend [name]. CSW asked pt for Mark's contact information in which pt was unable to provide a number or further contact information. CSW called [name] (emergency contact) per facesheet and left a voicemail. CSW will continue to follow discharge needs.    Claude MangesKierra S. Sohana Austell, MSW, LCSW-A Emergency Department Clinical Social Worker [phone]

## 2016-11-29 ENCOUNTER — Emergency Department (HOSPITAL_COMMUNITY)
Admission: EM | Admit: 2016-11-29 | Discharge: 2016-11-30 | Disposition: A | Discharge status: Home / Self Care | Payer: Medicare Other | Attending: Emergency Medicine | Admitting: Emergency Medicine

## 2016-11-29 ENCOUNTER — Encounter (HOSPITAL_COMMUNITY): Payer: Self-pay | Admitting: *Deleted

## 2016-11-29 DIAGNOSIS — H547 Unspecified visual loss: Secondary | ICD-10-CM | POA: Insufficient documentation

## 2016-11-29 DIAGNOSIS — J449 Chronic obstructive pulmonary disease, unspecified: Secondary | ICD-10-CM | POA: Diagnosis not present

## 2016-11-29 DIAGNOSIS — R5381 Other malaise: Secondary | ICD-10-CM | POA: Insufficient documentation

## 2016-11-29 DIAGNOSIS — Z79899 Other long term (current) drug therapy: Secondary | ICD-10-CM | POA: Diagnosis not present

## 2016-11-29 DIAGNOSIS — R42 Dizziness and giddiness: Secondary | ICD-10-CM | POA: Diagnosis present

## 2016-11-29 DIAGNOSIS — Z591 Inadequate housing: Secondary | ICD-10-CM | POA: Diagnosis not present

## 2016-11-29 DIAGNOSIS — Z87891 Personal history of nicotine dependence: Secondary | ICD-10-CM | POA: Insufficient documentation

## 2016-11-29 DIAGNOSIS — J45909 Unspecified asthma, uncomplicated: Secondary | ICD-10-CM | POA: Diagnosis not present

## 2016-11-29 DIAGNOSIS — I10 Essential (primary) hypertension: Secondary | ICD-10-CM | POA: Insufficient documentation

## 2016-11-29 LAB — URINALYSIS, ROUTINE W REFLEX MICROSCOPIC
Bilirubin Urine: NEGATIVE
GLUCOSE, UA: NEGATIVE mg/dL
HGB URINE DIPSTICK: NEGATIVE
Ketones, ur: NEGATIVE mg/dL
NITRITE: NEGATIVE
Protein, ur: NEGATIVE mg/dL
SPECIFIC GRAVITY, URINE: 1.016 (ref 1.005–1.030)
pH: 7 (ref 5.0–8.0)

## 2016-11-29 LAB — CBC
HCT: 36.5 % — ABNORMAL LOW (ref 39.0–52.0)
Hemoglobin: 12.3 g/dL — ABNORMAL LOW (ref 13.0–17.0)
MCH: 31.5 pg (ref 26.0–34.0)
MCHC: 33.7 g/dL (ref 30.0–36.0)
MCV: 93.6 fL (ref 78.0–100.0)
Platelets: 229 10*3/uL (ref 150–400)
RBC: 3.9 MIL/uL — ABNORMAL LOW (ref 4.22–5.81)
RDW: 13.5 % (ref 11.5–15.5)
WBC: 11.2 10*3/uL — ABNORMAL HIGH (ref 4.0–10.5)

## 2016-11-29 LAB — BASIC METABOLIC PANEL
ANION GAP: 8 (ref 5–15)
BUN: 33 mg/dL — AB (ref 6–20)
CO2: 25 mmol/L (ref 22–32)
Calcium: 8.4 mg/dL — ABNORMAL LOW (ref 8.9–10.3)
Chloride: 109 mmol/L (ref 101–111)
Creatinine, Ser: 1.55 mg/dL — ABNORMAL HIGH (ref 0.61–1.24)
GFR calc Af Amer: 53 mL/min — ABNORMAL LOW (ref >60)
GFR, EST NON AFRICAN AMERICAN: 45 mL/min — AB (ref >60)
GLUCOSE: 87 mg/dL (ref 65–99)
Potassium: 4.2 mmol/L (ref 3.5–5.1)
Sodium: 142 mmol/L (ref 135–145)

## 2016-11-29 LAB — CBG MONITORING, ED: Glucose-Capillary: 90 mg/dL (ref 65–99)

## 2016-11-29 NOTE — Discharge Instructions (Signed)
You have been offered placement to a nursing home or assisted living due to concerns for your living conditions. If you should decide to seek additional care through nursing home, contact your family doctor to discuss this.

## 2016-11-29 NOTE — ED Notes (Signed)
He is eating a sandwich without difficulty. 
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
## 2016-11-29 NOTE — ED Triage Notes (Signed)
[facility] has been called to transport back back to residence due to no one available to come transport pt via POV
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
## 2016-11-29 NOTE — ED Notes (Signed)
Pt calm and cooperative, enjoying Iona CoachHarry Potter on the TV at this time.

## 2016-11-29 NOTE — ED Provider Notes (Signed)
[facility]-EMERGENCY DEPT Provider Note   CSN: [id number] Arrival date & time: 11/29/16  1308     History   Chief Complaint Chief Complaint  Patient presents with  . Dizziness    HPI [name] [name] is a 66 y.o. male.  HPI Patient was brought by EMS for neighbors reportedly he was standing on his porch yelling for help. The patient reports that he does need someone to help him find his way inside. He has very poor baseline vision. He reports however he is doing fine at home. He reports he has meals brought in to him and his neighbors help him. Patient has been evaluated many times for very poor living conditions but refuses placement. He again reports that he really doesn't want to be in a nursing home. He reports he is doing a pretty good job taking care of himself at home. He reports however his vision has gotten worse. He denies any other acute problems. Past Medical History:  Diagnosis Date  . Anxiety   . Arthritis    spine  . Chronic lower back pain   . Contact dermatitis and other eczema, due to unspecified cause   . COPD (chronic obstructive pulmonary disease) (HCC)   . Cough   . Depression    patient constantly hitting left side of face "due to pain"  . Enlarged prostate   . Esophageal reflux   . H/O hiatal hernia   . Headache    PMH: migraines  . Hep C w/o coma, chronic (HCC) early 2013  . History of eye prosthesis   . History of kidney stones   . Hypertension    not on medication at this time  . 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 Multiple facial bone fractures (HCC)   . Multiple open wounds    "from esczema- scratching"  . Osteopenia   . Pancreatitis   . Pneumonia    in past had several times  . Seasonal allergies   . Ulcerative colitis   . Unspecified asthma(493.90)   . Unspecified sinusitis (chronic)     Patient Active Problem List   Diagnosis Date Noted  . 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 Major depressive disorder with psychotic features (HCC) 11/16/2016  . Lumbar back pain 02/04/2016  . AKI (acute kidney injury) (HCC) 01/08/2016  . Closed compression fracture of L3 lumbar vertebra (HCC) 01/08/2016  . Pruritus 10/01/2015  . Acute maxillary sinusitis 06/03/2014  . Severe asthma   . Acute on chronic respiratory failure (HCC) 05/31/2014  . Chronic cough 05/31/2014  . Asthma with acute exacerbation 05/31/2014  . Chronic pain syndrome 05/31/2014  . Malnutrition of moderate degree (HCC) 05/30/2014  . Hypokalemia 05/28/2014  . SOB (shortness of breath) 05/28/2014  . Weakness of back 02/03/2014  . Weakness of both legs 02/03/2014  . Gait instability 12/01/2013  . Allergic contact dermatitis 12/24/2011  . 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 Wrist joint infection (HCC) 10/10/2011    Class: Diagnosis of  . Urinary retention 09/16/2011  . 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 TACHYCARDIA 07/16/2010  . BLINDNESS 10/27/2008  . Sinusitis, chronic 08/03/2007  . Allergic rhinitis 08/03/2007  . Asthma 08/03/2007  . Esophageal reflux 08/03/2007  . ECZEMA 08/03/2007  . OSTEOPENIA 08/03/2007  . ULCERATIVE COLITIS, HX OF 08/03/2007    Past Surgical History:  Procedure Laterality Date  . CARPAL TUNNEL RELEASE Right 06/15/2013   Procedure: RIGHT  CARPAL TUNNEL RELEASE;  Surgeon: Marlowe ShoresMatthew A Weingold, MD;  Location: [facility];  Service: Orthopedics;  Laterality: Right;  . COLONOSCOPY W/ BIOPSIES AND POLYPECTOMY    . ENUCLEATION Left   . EYE SURGERY     3 Corneal transplant (bilateral eyes).    . FRACTURE SURGERY    . HARDWARE REMOVAL  10/09/2011   Procedure: HARDWARE REMOVAL;  Surgeon: Nadara MustardMarcus V Duda, MD;  [facility];  Service: Orthopedics;  Laterality: Right;  Removal Deep Hardware Right Wrist, placement antibiotic beads.  . INGUINAL HERNIA REPAIR Left    age 66  . Litrotripsy    . metacarpal pinning Right    right hand  . NERVE, TENDON AND  ARTERY REPAIR Right 12/05/2015   Procedure: RIGHT WRIST, HAND FLEXOR TENDON LENGTHENING;  Surgeon: Dairl PonderMatthew Weingold, MD;  Location: [facility] OR;  Service: Orthopedics;  Laterality: Right;  . NEUROLYSIS OF MEDIAN NERVE Right 12/05/2015   Procedure: RIGHT MEDIAN NERVE NEUROLYSIS ;  Surgeon: Dairl PonderMatthew Weingold, MD;  [facility];  Service: Orthopedics;  Laterality: Right;  . ORIF DISTAL RADIUS FRACTURE  09/12/11   right  . ORIF WRIST FRACTURE Right 06/15/2013   Procedure: RIGHT RADIUS PSTEOTOMY, DISTAL ULNA RESECTION, AND DIGITAL FLEXOR LENGTHENING AS NEEDED;  Surgeon: Marlowe ShoresMatthew A Weingold, MD;  Location:  SURGERY CENTER;  Service: Orthopedics;  Laterality: Right;  . PATELLA FRACTURE SURGERY     right  . RADIOLOGY WITH ANESTHESIA N/A 03/09/2014   Procedure: MRI - Cervical and Lumbar without Contrast;  Surgeon: Medication Radiologist, MD;  Location: [facility] OR;  Service: Radiology;  Laterality: N/A;  . RADIOLOGY WITH ANESTHESIA N/A 04/03/2015   Procedure: MRI OF BRAIN    (RADIOLOGY WITH ANESTHESIA);  Surgeon: Medication Radiologist, MD;  Location: [facility] OR;  Service: Radiology;  Laterality: N/A;  . TONSILLECTOMY AND ADENOIDECTOMY         Home Medications    Prior to Admission medications   Medication Sig Start Date End Date Taking? Authorizing Provider  albuterol (PROVENTIL HFA;VENTOLIN HFA) 108 (90 Base) MCG/ACT inhaler Inhale 2 puffs into the lungs every 6 (six) hours as needed for wheezing or shortness of breath. 08/13/16   Mannam, Praveen, MD  albuterol (PROVENTIL) (2.5 MG/3ML) 0.083% nebulizer solution Take 3 mLs (2.5 mg total) by nebulization every 4 (four) hours as needed for wheezing or shortness of breath. 04/17/15   Parrett, Virgel Bouquetammy S, NP  bisacodyl (DULCOLAX) 10 MG suppository Place 1 suppository (10 mg total) rectally as needed for moderate constipation. 01/11/16   Calvert Cantorizwan, Saima, MD  diazepam (VALIUM) 10 MG tablet Take 1 tablet (10 mg total) by mouth 3 (three) times daily. 06/06/14   Osvaldo ShipperKrishnan,  Gokul, MD  DULERA 200-5 MCG/ACT AERO INHALE 2 PUFFS INTO THE LUNGS TWICE A DAY Patient not taking: Reported on 11/04/2016 04/15/16   Chilton GreathouseMannam, Praveen, MD  EPINEPHrine (EPIPEN 2-PAK) 0.3 mg/0.3 mL IJ SOAJ injection Inject 0.3 mg into the muscle once as needed (for severe allergic reaction).    [provider]  fluticasone (FLONASE) 50 MCG/ACT nasal spray USE 2 SPRAYS IN EACH NOSTRIL TWICE DAILY. 05/08/16   Mannam, Colbert CoyerPraveen, MD  gabapentin (NEURONTIN) 100 MG capsule Take 2 capsules (200 mg total) by mouth 2 (two) times daily. 11/17/16   Charm RingsLord, Jamison Y, NP  hydrOXYzine (ATARAX/VISTARIL) 25 MG tablet Take 1 tablet (25 mg total) by mouth every 6 (six) hours as needed for itching. 11/17/16   Charm RingsLord, Jamison Y, NP  mometasone-formoterol (DULERA) 200-5 MCG/ACT AERO 2 puffs inhaled  twice daily. Needs office visit for future refills. Patient taking differently: Inhale 2 puffs into the lungs 2 (two) times daily as needed for wheezing or shortness of breath.  06/03/16   Mannam, Colbert Coyer, MD  predniSONE (DELTASONE) 20 MG tablet Take 2 tablets (40 mg total) by mouth daily. 11/25/16   Benjiman Core, MD  PROAIR HFA 108 [phone] Base) MCG/ACT inhaler INHALE 2 PUFFS INTO THE LUNGS EVERY 6 HOURS AS NEEDED FOR SHORTNESS OF BREATH 02/29/16   Mannam, Praveen, MD  sertraline (ZOLOFT) 25 MG tablet Take 1 tablet (25 mg total) by mouth daily. 11/18/16   Charm Rings, NP    Family History Family History  Problem Relation Age of Onset  . Allergies Mother        atopic  . Asthma Mother   . Heart failure Mother        CHF  . Other Father   . Anesthesia problems Neg Hx     Social History Social History  Substance Use Topics  . Smoking status: Former Smoker    Packs/day: 1.00    Years: 3.00    Types: Cigarettes, Pipe    Quit date: 05/20/1983  . Smokeless tobacco: Never Used  . Alcohol use Yes     Comment: "scotch once a year"     Allergies   Bacitracin-polymyxin b; Bee venom; Codeine; Norco  [hydrocodone-acetaminophen]; Penicillins; Shellfish-derived products; Other; Tape; Chlorhexidine; and Sulfate   Review of Systems Review of Systems 10 Systems reviewed and are negative for acute change except as noted in the HPI.   Physical Exam Updated Vital Signs BP (!) 132/97 (BP Location: Left Arm)   Pulse 67   Temp 98.1 F (36.7 C) (Oral)   Resp 16   SpO2 98%   Physical Exam  Constitutional: He is oriented to person, place, and time.  Patient is alert and nontoxic no respiratory distress. Clinically well and appearance.  HENT:  Mouth/Throat: Oropharynx is clear and moist.  Patient's left eye is prosthetic with some sunken periorbital tissues. The right eye appears to been enuculated, however he claims that he has vision in that eye.  Cardiovascular: Normal rate, regular rhythm, normal heart sounds and intact distal pulses.   Pulmonary/Chest: Effort normal and breath sounds normal.  Abdominal: Soft. He exhibits no distension. There is no tenderness.  Musculoskeletal: Normal range of motion. He exhibits no edema or tenderness.  Neurological: He is alert and oriented to person, place, and time. No cranial nerve deficit. He exhibits normal muscle tone. Coordination normal.  Skin:  Skin dryness with dry excoriations.  Psychiatric: He has a normal mood and affect.     ED Treatments / Results  Labs (all labs ordered are listed, but only abnormal results are displayed) Labs Reviewed  BASIC METABOLIC PANEL - Abnormal; Notable for the following:       Result Value   BUN 33 (*)    Creatinine, Ser 1.55 (*)    Calcium 8.4 (*)    GFR calc non Af Amer 45 (*)    GFR calc Af Amer 53 (*)    All other components within normal limits  CBC - Abnormal; Notable for the following:    WBC 11.2 (*)    RBC 3.90 (*)    Hemoglobin 12.3 (*)    HCT 36.5 (*)    All other components within normal limits  URINALYSIS, ROUTINE W REFLEX MICROSCOPIC - Abnormal; Notable for the following:     Leukocytes, UA TRACE (*)  Bacteria, UA RARE (*)    Squamous Epithelial / LPF 0-5 (*)    All other components within normal limits  CBG MONITORING, ED    EKG  EKG Interpretation None       Radiology No results found.  Procedures Procedures (including critical care time)  Medications Ordered in ED Medications - No data to display   Initial Impression / Assessment and Plan / ED Course  I have reviewed the triage vital signs and the nursing notes.  Pertinent labs & imaging results that were available during my care of the patient were reviewed by me and considered in my medical decision making (see chart for details).     Final Clinical Impressions(s) / ED Diagnoses   Final diagnoses:  Blindness  Unsatisfactory living conditions   Patient is alert and clinically well and appearance. He appears to have near total blindness. 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 There is an empty socket with some scleral tissue that invaginates in the center. Patient however reports partial vision that allows him to function at his home. Again, social work has been consult and patient is being offered placement. Aside from blindness however he does seem to be cognitively intact and likely would build a manage his own basic care with some assistance. Social work will discuss placement and assistance,  ultimately he will make his own decisions regarding what he permits to be done for him. New Prescriptions New Prescriptions   No medications on file     Arby Barrette, MD 11/29/16 [phone]

## 2016-11-29 NOTE — ED Notes (Signed)
He is given a hot meal.

## 2016-11-29 NOTE — ED Triage Notes (Addendum)
EMS called by neighbor as pt ws standing on porch yelling for help. He wanted someone to take him to [name] [name], Pt is blind, dizziness with standing, ankle pain. Living conditions poor with house infested with insects and bed bugs, city has condemned the house.SW consult needed. CBG 154 VS 122/90 - 104-18 98% RA

## 2016-11-29 NOTE — ED Notes (Signed)
Bed: WA31 Expected date:  Expected time:  Means of arrival:  Comments: Room 13 

## 2016-11-30 DIAGNOSIS — H547 Unspecified visual loss: Secondary | ICD-10-CM | POA: Diagnosis not present

## 2016-11-30 MED ORDER — HYDROCORTISONE 1 % EX CREA
TOPICAL_CREAM | Freq: Two times a day (BID) | CUTANEOUS | Status: DC
  Administered 2016-11-30: 02:00:00 via TOPICAL
  Filled 2016-11-30: qty 28

## 2016-11-30 NOTE — Progress Notes (Signed)
Patient discharge to home. DC instructions given. Pt voiced understanding. Pt Unable to sign discharge summary electronically as he is legally blind. Left unit in wheelchair pushed by nurse tech and escorted by hospital security to bus stop. Left in stable condition. Maree ErieVera Nimai Burbach, rn.

## 2016-11-30 NOTE — ED Notes (Signed)
MD in room talking to patient.

## 2016-11-30 NOTE — Progress Notes (Signed)
CSW unable to make with patient's emergency contact, patient unable to provide contact information for any other supports. CSW staffed case with CSW ChiropodistAssistant Director. CSW ChiropodistAssistant Director advised CSW to provide patient with bus pass for transportation and shelter resources. CSW provided patient with bus pass and shelter list. CSW signing off, no other needs identified at this time. Please consult if new needs arise.   Celso SickleKimberly Delaney Perona, LCSWA [facility]  Clinical Social Worker [phone]

## 2016-11-30 NOTE — ED Provider Notes (Signed)
11: 30- I was informed by the ED, social worker, and the charge nurse, that the patient continues to want to go home, and will get there by bus.  Repeat vital signs are normal.  ED record reviewed.  The patient has been in the emergency department multiple times this month.  Patient is alert, and responsive at this time, and states that he desires to go home but needs some help to get there.       Mancel BaleWentz, Muna Demers, MD 11/30/16 1146

## 2016-12-23 ENCOUNTER — Encounter (HOSPITAL_COMMUNITY): Payer: Self-pay | Admitting: Emergency Medicine

## 2016-12-23 ENCOUNTER — Emergency Department (HOSPITAL_COMMUNITY)
Admission: EM | Admit: 2016-12-23 | Discharge: 2016-12-24 | Disposition: A | Discharge status: Home / Self Care | Payer: Medicare Other | Attending: Physician Assistant | Admitting: Physician Assistant

## 2016-12-23 DIAGNOSIS — I10 Essential (primary) hypertension: Secondary | ICD-10-CM | POA: Diagnosis not present

## 2016-12-23 DIAGNOSIS — F419 Anxiety disorder, unspecified: Secondary | ICD-10-CM | POA: Insufficient documentation

## 2016-12-23 DIAGNOSIS — J449 Chronic obstructive pulmonary disease, unspecified: Secondary | ICD-10-CM | POA: Insufficient documentation

## 2016-12-23 DIAGNOSIS — J45909 Unspecified asthma, uncomplicated: Secondary | ICD-10-CM | POA: Insufficient documentation

## 2016-12-23 DIAGNOSIS — Z87891 Personal history of nicotine dependence: Secondary | ICD-10-CM | POA: Diagnosis not present

## 2016-12-23 DIAGNOSIS — F329 Major depressive disorder, single episode, unspecified: Secondary | ICD-10-CM | POA: Diagnosis not present

## 2016-12-23 DIAGNOSIS — R451 Restlessness and agitation: Secondary | ICD-10-CM | POA: Diagnosis present

## 2016-12-23 DIAGNOSIS — Z79899 Other long term (current) drug therapy: Secondary | ICD-10-CM | POA: Diagnosis not present

## 2016-12-23 LAB — URINALYSIS, ROUTINE W REFLEX MICROSCOPIC
Bilirubin Urine: NEGATIVE
Glucose, UA: NEGATIVE mg/dL
HGB URINE DIPSTICK: NEGATIVE
Ketones, ur: NEGATIVE mg/dL
Leukocytes, UA: NEGATIVE
NITRITE: NEGATIVE
PH: 5 (ref 5.0–8.0)
Protein, ur: NEGATIVE mg/dL
SPECIFIC GRAVITY, URINE: 1.023 (ref 1.005–1.030)

## 2016-12-23 LAB — BASIC METABOLIC PANEL
ANION GAP: 9 (ref 5–15)
BUN: 13 mg/dL (ref 6–20)
CALCIUM: 9 mg/dL (ref 8.9–10.3)
CO2: 24 mmol/L (ref 22–32)
Chloride: 108 mmol/L (ref 101–111)
Creatinine, Ser: 1.21 mg/dL (ref 0.61–1.24)
GFR calc non Af Amer: >60 mL/min (ref >60)
Glucose, Bld: 100 mg/dL — ABNORMAL HIGH (ref 65–99)
POTASSIUM: 3.8 mmol/L (ref 3.5–5.1)
Sodium: 141 mmol/L (ref 135–145)

## 2016-12-23 LAB — CBC
HEMATOCRIT: 41.9 % (ref 39.0–52.0)
HEMOGLOBIN: 14.4 g/dL (ref 13.0–17.0)
MCH: 31.7 pg (ref 26.0–34.0)
MCHC: 34.4 g/dL (ref 30.0–36.0)
MCV: 92.3 fL (ref 78.0–100.0)
Platelets: 218 10*3/uL (ref 150–400)
RBC: 4.54 MIL/uL (ref 4.22–5.81)
RDW: 13.1 % (ref 11.5–15.5)
WBC: 8.7 10*3/uL (ref 4.0–10.5)

## 2016-12-23 NOTE — Care Management (Signed)
ED CM attempted to contact patient's emergency contact Jefm PettyKirby Jones [phone] regarding [facility], no answer patient is cleared for discharge.

## 2016-12-23 NOTE — ED Triage Notes (Signed)
Pt arrives from home with care giver that reports he has been living with her for the last 3 weeks after his home was condemned. Care giver states he has been aggressive and yelling and cursing at her. Pt is blind and at registation was initially upset because he did not know where he was bought to. Pt is alert AND oriented to person time and situation. Pt denies and pain or complaints. Care giver left pt at triage stating he could no live with her.

## 2016-12-23 NOTE — Discharge Instructions (Signed)
Follow up with your PCP as needed.

## 2016-12-23 NOTE — ED Notes (Signed)
Pt. Given bag lunch and sprite, food set up, had patient feel where everything was on table

## 2016-12-23 NOTE — ED Provider Notes (Signed)
[facility]-EMERGENCY DEPT Provider Note   CSN: [id number] Arrival date & time: 12/23/16  1407     History   Chief Complaint Chief Complaint  Patient presents with  . Agitation    HPI [name] [name] is a 66 y.o. male.  This is a 66 year old- legally blind male who presents to the emergency department in the care of a caregiver who reports that he has been agitated, yelling and cursing at her. Patient states that he has not eaten since yesterday because no meal was provided for him today.  Patient has no physical complaints of any illness.      Past Medical History:  Diagnosis Date  . Anxiety   . Arthritis    spine  . Chronic lower back pain   . Contact dermatitis and other eczema, due to unspecified cause   . COPD (chronic obstructive pulmonary disease) (HCC)   . Cough   . Depression    patient constantly hitting left side of face "due to pain"  . Enlarged prostate   . Esophageal reflux   . H/O hiatal hernia   . Headache    PMH: migraines  . Hep C w/o coma, chronic (HCC) early 2013  . History of eye prosthesis   . History of kidney stones   . Hypertension    not on medication at this time  . 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 Multiple facial bone fractures (HCC)   . Multiple open wounds    "from esczema- scratching"  . Osteopenia   . Pancreatitis   . Pneumonia    in past had several times  . Seasonal allergies   . Ulcerative colitis   . Unspecified asthma(493.90)   . Unspecified sinusitis (chronic)     Patient Active Problem List   Diagnosis Date Noted  . 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 Major depressive disorder with psychotic features (HCC) 11/16/2016  . Lumbar back pain 02/04/2016  . AKI (acute kidney injury) (HCC) 01/08/2016  . Closed compression fracture of L3 lumbar vertebra (HCC) 01/08/2016  . Pruritus 10/01/2015  . Acute maxillary sinusitis 06/03/2014  . Severe asthma   .  Acute on chronic respiratory failure (HCC) 05/31/2014  . Chronic cough 05/31/2014  . Asthma with acute exacerbation 05/31/2014  . Chronic pain syndrome 05/31/2014  . Malnutrition of moderate degree (HCC) 05/30/2014  . Hypokalemia 05/28/2014  . SOB (shortness of breath) 05/28/2014  . Weakness of back 02/03/2014  . Weakness of both legs 02/03/2014  . Gait instability 12/01/2013  . Allergic contact dermatitis 12/24/2011  . 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 Wrist joint infection (HCC) 10/10/2011    Class: Diagnosis of  . Urinary retention 09/16/2011  . 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 TACHYCARDIA 07/16/2010  . BLINDNESS 10/27/2008  . Sinusitis, chronic 08/03/2007  . Allergic rhinitis 08/03/2007  . Asthma 08/03/2007  . Esophageal reflux 08/03/2007  . ECZEMA 08/03/2007  . OSTEOPENIA 08/03/2007  . ULCERATIVE COLITIS, HX OF 08/03/2007    Past Surgical History:  Procedure Laterality Date  . CARPAL TUNNEL RELEASE Right 06/15/2013   Procedure: RIGHT CARPAL TUNNEL RELEASE;  Surgeon: Marlowe Shores, MD;  Location: [facility];  Service: Orthopedics;  Laterality: Right;  . COLONOSCOPY W/ BIOPSIES AND POLYPECTOMY    . ENUCLEATION Left   . EYE SURGERY     3 Corneal transplant (bilateral eyes).    . FRACTURE SURGERY    .  HARDWARE REMOVAL  10/09/2011   Procedure: HARDWARE REMOVAL;  Surgeon: Nadara Mustard, MD;  Location: [facility] OR;  Service: Orthopedics;  Laterality: Right;  Removal Deep Hardware Right Wrist, placement antibiotic beads.  . INGUINAL HERNIA REPAIR Left    age 3  . Litrotripsy    . metacarpal pinning Right    right hand  . NERVE, TENDON AND ARTERY REPAIR Right 12/05/2015   Procedure: RIGHT WRIST, HAND FLEXOR TENDON LENGTHENING;  Surgeon: Dairl Ponder, MD;  Location: [facility] OR;  Service: Orthopedics;  Laterality: Right;  . NEUROLYSIS OF MEDIAN NERVE Right 12/05/2015   Procedure: RIGHT MEDIAN NERVE NEUROLYSIS ;  Surgeon: Dairl Ponder, MD;  Location: [facility] OR;   Service: Orthopedics;  Laterality: Right;  . ORIF DISTAL RADIUS FRACTURE  09/12/11   right  . ORIF WRIST FRACTURE Right 06/15/2013   Procedure: RIGHT RADIUS PSTEOTOMY, DISTAL ULNA RESECTION, AND DIGITAL FLEXOR LENGTHENING AS NEEDED;  Surgeon: Marlowe Shores, MD;  Location: [facility];  Service: Orthopedics;  Laterality: Right;  . PATELLA FRACTURE SURGERY     right  . RADIOLOGY WITH ANESTHESIA N/A 03/09/2014   Procedure: MRI - Cervical and Lumbar without Contrast;  Surgeon: Medication Radiologist, MD;  Location: [facility] OR;  Service: Radiology;  Laterality: N/A;  . RADIOLOGY WITH ANESTHESIA N/A 04/03/2015   Procedure: MRI OF BRAIN    (RADIOLOGY WITH ANESTHESIA);  Surgeon: Medication Radiologist, MD;  Location: [facility] OR;  Service: Radiology;  Laterality: N/A;  . TONSILLECTOMY AND ADENOIDECTOMY         Home Medications    Prior to Admission medications   Medication Sig Start Date End Date Taking? Authorizing Provider  albuterol (PROVENTIL HFA;VENTOLIN HFA) 108 (90 Base) MCG/ACT inhaler Inhale 2 puffs into the lungs every 6 (six) hours as needed for wheezing or shortness of breath. 08/13/16   Mannam, Praveen, MD  albuterol (PROVENTIL) (2.5 MG/3ML) 0.083% nebulizer solution Take 3 mLs (2.5 mg total) by nebulization every 4 (four) hours as needed for wheezing or shortness of breath. 04/17/15   Parrett, Virgel Bouquet, NP  bisacodyl (DULCOLAX) 10 MG suppository Place 1 suppository (10 mg total) rectally as needed for moderate constipation. Patient not taking: Reported on 11/29/2016 01/11/16   Calvert Cantor, MD  diazepam (VALIUM) 10 MG tablet Take 1 tablet (10 mg total) by mouth 3 (three) times daily. Patient not taking: Reported on 11/29/2016 06/06/14   Osvaldo Shipper, MD  DULERA 200-5 MCG/ACT AERO INHALE 2 PUFFS INTO THE LUNGS TWICE A DAY 04/15/16   Mannam, Colbert Coyer, MD  EPINEPHrine (EPIPEN 2-PAK) 0.3 mg/0.3 mL IJ SOAJ injection Inject 0.3 mg into the muscle once as needed (for severe allergic  reaction).    [provider]  fluticasone (FLONASE) 50 MCG/ACT nasal spray USE 2 SPRAYS IN EACH NOSTRIL TWICE DAILY. 05/08/16   Mannam, Colbert Coyer, MD  gabapentin (NEURONTIN) 100 MG capsule Take 2 capsules (200 mg total) by mouth 2 (two) times daily. 11/17/16   Charm Rings, NP  hydrOXYzine (ATARAX/VISTARIL) 25 MG tablet Take 1 tablet (25 mg total) by mouth every 6 (six) hours as needed for itching. 11/17/16   Charm Rings, NP  mometasone-formoterol (DULERA) 200-5 MCG/ACT AERO 2 puffs inhaled twice daily. Needs office visit for future refills. Patient taking differently: Inhale 2 puffs into the lungs 2 (two) times daily as needed for wheezing or shortness of breath.  06/03/16   Mannam, Colbert Coyer, MD  predniSONE (DELTASONE) 20 MG tablet Take 2 tablets (40 mg total) by mouth daily. 11/25/16  Benjiman CorePickering, Nathan, MD  PROAIR HFA 108 [phone](90 Base) MCG/ACT inhaler INHALE 2 PUFFS INTO THE LUNGS EVERY 6 HOURS AS NEEDED FOR SHORTNESS OF BREATH 02/29/16   Mannam, Praveen, MD  sertraline (ZOLOFT) 25 MG tablet Take 1 tablet (25 mg total) by mouth daily. 11/18/16   Charm RingsLord, Jamison Y, NP    Family History Family History  Problem Relation Age of Onset  . Allergies Mother        atopic  . Asthma Mother   . Heart failure Mother        CHF  . Other Father   . Anesthesia problems Neg Hx     Social History Social History  Substance Use Topics  . Smoking status: Former Smoker    Packs/day: 1.00    Years: 3.00    Types: Cigarettes, Pipe    Quit date: 05/20/1983  . Smokeless tobacco: Never Used  . Alcohol use Yes     Comment: "scotch once a year"     Allergies   Bacitracin-polymyxin b; Bee venom; Codeine; Norco [hydrocodone-acetaminophen]; Penicillins; Shellfish-derived products; Other; Tape; Chlorhexidine; and Sulfate   Review of Systems Review of Systems  Constitutional: Negative for fever.  HENT: Negative.   Cardiovascular: Negative.   Gastrointestinal: Negative.   Genitourinary: Negative.     Musculoskeletal: Negative.   Neurological: Negative.   Psychiatric/Behavioral: Negative.   All other systems reviewed and are negative.    Physical Exam Updated Vital Signs BP 126/86   Pulse 95   Temp 98 F (36.7 C) (Oral)   Resp 18   SpO2 96%   Physical Exam  Constitutional: He appears well-developed and well-nourished. No distress.  HENT:  Head: Normocephalic.  Eyes:  blind  Pulmonary/Chest: Effort normal.  Musculoskeletal: Normal range of motion.  Skin: Skin is warm.  Psychiatric: He has a normal mood and affect.  Nursing note and vitals reviewed.    ED Treatments / Results  Labs (all labs ordered are listed, but only abnormal results are displayed) Labs Reviewed  BASIC METABOLIC PANEL - Abnormal; Notable for the following:       Result Value   Glucose, Bld 100 (*)    All other components within normal limits  CBC  URINALYSIS, ROUTINE W REFLEX MICROSCOPIC    EKG  EKG Interpretation None       Radiology No results found.  Procedures Procedures (including critical care time)  Medications Ordered in ED Medications - No data to display   Initial Impression / Assessment and Plan / ED Course  I have reviewed the triage vital signs and the nursing notes.  Pertinent labs & imaging results that were available during my care of the patient were reviewed by me and considered in my medical decision making (see chart for details).    All labs are within normal parameters.  Patient has been given a meal, he has no complaints at time of discharge  Patient has been seen by case manager.  He will be given a taxi voucher and to be discharged to the homeless shelter.  Final Clinical Impressions(s) / ED Diagnoses   Final diagnoses:  Agitation    New Prescriptions New Prescriptions   No medications on file     Earley FavorSchulz, Peggy Loge, NP 12/23/16 2247    Abelino DerrickMackuen, Courteney Lyn, MD 12/23/16 [phone]

## 2016-12-24 ENCOUNTER — Emergency Department (HOSPITAL_COMMUNITY)
Admission: EM | Admit: 2016-12-24 | Discharge: 2016-12-25 | Disposition: A | Discharge status: Home / Self Care | Payer: Medicare Other | Source: Home / Self Care

## 2016-12-24 ENCOUNTER — Encounter (HOSPITAL_COMMUNITY): Payer: Self-pay | Admitting: *Deleted

## 2016-12-24 DIAGNOSIS — M549 Dorsalgia, unspecified: Secondary | ICD-10-CM

## 2016-12-24 DIAGNOSIS — H547 Unspecified visual loss: Secondary | ICD-10-CM | POA: Insufficient documentation

## 2016-12-24 DIAGNOSIS — G8929 Other chronic pain: Secondary | ICD-10-CM

## 2016-12-24 DIAGNOSIS — Z5321 Procedure and treatment not carried out due to patient leaving prior to being seen by health care provider: Secondary | ICD-10-CM

## 2016-12-24 DIAGNOSIS — R451 Restlessness and agitation: Secondary | ICD-10-CM | POA: Diagnosis not present

## 2016-12-24 NOTE — ED Notes (Signed)
Per [facility], pt cannot be cared for there d/t blindness. Returned to ED for this reason.

## 2016-12-24 NOTE — ED Triage Notes (Signed)
Pt is homeless, was dropped off by someone that picked him up. Reports [facility]. Has chronic back pain. Pt is also blind

## 2016-12-24 NOTE — Discharge Planning (Signed)
EDCM and EDSW spoke with pt in ER lobby regarding discharge planning and suggested pt visit [facility] upon leaving ER today.  ER Care Management Team provided a bus ticket and address for pt next destination, of which pt is familiar with the area.  No further CM needs identified at this time.

## 2016-12-29 ENCOUNTER — Encounter: Payer: Self-pay | Admitting: Pediatric Intensive Care

## 2017-01-05 ENCOUNTER — Emergency Department (HOSPITAL_COMMUNITY): Payer: Medicare Other

## 2017-01-05 ENCOUNTER — Encounter (HOSPITAL_COMMUNITY): Payer: Self-pay | Admitting: Emergency Medicine

## 2017-01-05 ENCOUNTER — Emergency Department (HOSPITAL_COMMUNITY)
Admission: EM | Admit: 2017-01-05 | Discharge: 2017-01-05 | Disposition: A | Discharge status: Home / Self Care | Payer: Medicare Other | Attending: Emergency Medicine | Admitting: Emergency Medicine

## 2017-01-05 DIAGNOSIS — R350 Frequency of micturition: Secondary | ICD-10-CM

## 2017-01-05 DIAGNOSIS — X500XXA Overexertion from strenuous movement or load, initial encounter: Secondary | ICD-10-CM | POA: Diagnosis not present

## 2017-01-05 DIAGNOSIS — S93401A Sprain of unspecified ligament of right ankle, initial encounter: Secondary | ICD-10-CM | POA: Insufficient documentation

## 2017-01-05 DIAGNOSIS — Z87891 Personal history of nicotine dependence: Secondary | ICD-10-CM | POA: Insufficient documentation

## 2017-01-05 DIAGNOSIS — Y929 Unspecified place or not applicable: Secondary | ICD-10-CM | POA: Diagnosis not present

## 2017-01-05 DIAGNOSIS — Z79899 Other long term (current) drug therapy: Secondary | ICD-10-CM | POA: Insufficient documentation

## 2017-01-05 DIAGNOSIS — J449 Chronic obstructive pulmonary disease, unspecified: Secondary | ICD-10-CM | POA: Insufficient documentation

## 2017-01-05 DIAGNOSIS — J45909 Unspecified asthma, uncomplicated: Secondary | ICD-10-CM | POA: Insufficient documentation

## 2017-01-05 DIAGNOSIS — Y939 Activity, unspecified: Secondary | ICD-10-CM | POA: Insufficient documentation

## 2017-01-05 DIAGNOSIS — Z5900 Homelessness unspecified: Secondary | ICD-10-CM

## 2017-01-05 DIAGNOSIS — I10 Essential (primary) hypertension: Secondary | ICD-10-CM | POA: Diagnosis not present

## 2017-01-05 DIAGNOSIS — Y999 Unspecified external cause status: Secondary | ICD-10-CM | POA: Insufficient documentation

## 2017-01-05 DIAGNOSIS — S99911A Unspecified injury of right ankle, initial encounter: Secondary | ICD-10-CM | POA: Diagnosis present

## 2017-01-05 LAB — CBC WITH DIFFERENTIAL/PLATELET
BASOS ABS: 0 10*3/uL (ref 0.0–0.1)
BASOS PCT: 0 %
Eosinophils Absolute: 0.3 10*3/uL (ref 0.0–0.7)
Eosinophils Relative: 3 %
HCT: 38 % — ABNORMAL LOW (ref 39.0–52.0)
HEMOGLOBIN: 13.2 g/dL (ref 13.0–17.0)
LYMPHS PCT: 13 %
Lymphs Abs: 1 10*3/uL (ref 0.7–4.0)
MCH: 32 pg (ref 26.0–34.0)
MCHC: 34.7 g/dL (ref 30.0–36.0)
MCV: 92 fL (ref 78.0–100.0)
MONO ABS: 0.6 10*3/uL (ref 0.1–1.0)
Monocytes Relative: 8 %
NEUTROS ABS: 5.8 10*3/uL (ref 1.7–7.7)
NEUTROS PCT: 76 %
Platelets: 206 10*3/uL (ref 150–400)
RBC: 4.13 MIL/uL — AB (ref 4.22–5.81)
RDW: 13.2 % (ref 11.5–15.5)
WBC: 7.8 10*3/uL (ref 4.0–10.5)

## 2017-01-05 LAB — URINALYSIS, ROUTINE W REFLEX MICROSCOPIC
Bilirubin Urine: NEGATIVE
GLUCOSE, UA: NEGATIVE mg/dL
Hgb urine dipstick: NEGATIVE
KETONES UR: NEGATIVE mg/dL
LEUKOCYTES UA: NEGATIVE
NITRITE: NEGATIVE
PROTEIN: NEGATIVE mg/dL
Specific Gravity, Urine: 1.009 (ref 1.005–1.030)
pH: 6 (ref 5.0–8.0)

## 2017-01-05 LAB — I-STAT CHEM 8, ED
BUN: 16 mg/dL (ref 6–20)
CHLORIDE: 106 mmol/L (ref 101–111)
CREATININE: 1 mg/dL (ref 0.61–1.24)
Calcium, Ion: 1.12 mmol/L — ABNORMAL LOW (ref 1.15–1.40)
GLUCOSE: 89 mg/dL (ref 65–99)
HEMATOCRIT: 37 % — AB (ref 39.0–52.0)
HEMOGLOBIN: 12.6 g/dL — AB (ref 13.0–17.0)
POTASSIUM: 4 mmol/L (ref 3.5–5.1)
Sodium: 140 mmol/L (ref 135–145)
TCO2: 23 mmol/L (ref 0–100)

## 2017-01-05 MED ORDER — TAMSULOSIN HCL 0.4 MG PO CAPS
0.4000 mg | ORAL_CAPSULE | Freq: Every day | ORAL | Status: DC
  Administered 2017-01-05: 0.4 mg via ORAL
  Filled 2017-01-05: qty 1

## 2017-01-05 MED ORDER — TAMSULOSIN HCL 0.4 MG PO CAPS: 0.4000 mg | ORAL_CAPSULE | Freq: Every day | ORAL | 0 refills | Status: DC

## 2017-01-05 NOTE — Discharge Instructions (Signed)
Follow up at [facility] for recheck if symptoms persist

## 2017-01-05 NOTE — ED Triage Notes (Signed)
Pt presents to ED via [facility] with c/o urinary incontinent and altercation with a staff [employer] housing over incontinent.

## 2017-01-05 NOTE — ED Provider Notes (Signed)
[facility]-EMERGENCY DEPT Provider Note   CSN: [id number] Arrival date & time: 01/05/17  0136     History   Chief Complaint Chief Complaint  Patient presents with  . Urinary Frequency    HPI [name] [name] is a 66 y.o. male.  The history is provided by the patient.  Urinary Frequency  This is a chronic problem. The current episode started more than 1 week ago. The problem occurs constantly. The problem has not changed since onset.Pertinent negatives include no chest pain, no abdominal pain, no headaches and no shortness of breath. Nothing aggravates the symptoms. Nothing relieves the symptoms. He has tried nothing for the symptoms. The treatment provided no relief.    Past Medical History:  Diagnosis Date  . Anxiety   . Arthritis    spine  . Chronic lower back pain   . Contact dermatitis and other eczema, due to unspecified cause   . COPD (chronic obstructive pulmonary disease) (HCC)   . Cough   . Depression    patient constantly hitting left side of face "due to pain"  . Enlarged prostate   . Esophageal reflux   . H/O hiatal hernia   . Headache    PMH: migraines  . Hep C w/o coma, chronic (HCC) early 2013  . History of eye prosthesis   . History of kidney stones   . Hypertension    not on medication at this time  . 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 Multiple facial bone fractures (HCC)   . Multiple open wounds    "from esczema- scratching"  . Osteopenia   . Pancreatitis   . Pneumonia    in past had several times  . Seasonal allergies   . Ulcerative colitis   . Unspecified asthma(493.90)   . Unspecified sinusitis (chronic)     Patient Active Problem List   Diagnosis Date Noted  . 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 Major depressive disorder with psychotic features (HCC) 11/16/2016  . Lumbar back pain 02/04/2016  . AKI (acute kidney injury) (HCC) 01/08/2016  . Closed compression fracture  of L3 lumbar vertebra (HCC) 01/08/2016  . Pruritus 10/01/2015  . Acute maxillary sinusitis 06/03/2014  . Severe asthma   . Acute on chronic respiratory failure (HCC) 05/31/2014  . Chronic cough 05/31/2014  . Asthma with acute exacerbation 05/31/2014  . Chronic pain syndrome 05/31/2014  . Malnutrition of moderate degree (HCC) 05/30/2014  . Hypokalemia 05/28/2014  . SOB (shortness of breath) 05/28/2014  . Weakness of back 02/03/2014  . Weakness of both legs 02/03/2014  . Gait instability 12/01/2013  . Allergic contact dermatitis 12/24/2011  . 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 Wrist joint infection (HCC) 10/10/2011    Class: Diagnosis of  . Urinary retention 09/16/2011  . 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 TACHYCARDIA 07/16/2010  . BLINDNESS 10/27/2008  . Sinusitis, chronic 08/03/2007  . Allergic rhinitis 08/03/2007  . Asthma 08/03/2007  . Esophageal reflux 08/03/2007  . ECZEMA 08/03/2007  . OSTEOPENIA 08/03/2007  . ULCERATIVE COLITIS, HX OF 08/03/2007    Past Surgical History:  Procedure Laterality Date  . CARPAL TUNNEL RELEASE Right 06/15/2013   Procedure: RIGHT CARPAL TUNNEL RELEASE;  Surgeon: Marlowe Shores, MD;  Location: [facility];  Service: Orthopedics;  Laterality: Right;  . COLONOSCOPY W/ BIOPSIES AND POLYPECTOMY    . ENUCLEATION Left   . EYE SURGERY     3  Corneal transplant (bilateral eyes).    . FRACTURE SURGERY    . HARDWARE REMOVAL  10/09/2011   Procedure: HARDWARE REMOVAL;  Surgeon: Nadara Mustard, MD;  Location: [facility] OR;  Service: Orthopedics;  Laterality: Right;  Removal Deep Hardware Right Wrist, placement antibiotic beads.  . INGUINAL HERNIA REPAIR Left    age 56  . Litrotripsy    . metacarpal pinning Right    right hand  . NERVE, TENDON AND ARTERY REPAIR Right 12/05/2015   Procedure: RIGHT WRIST, HAND FLEXOR TENDON LENGTHENING;  Surgeon: Dairl Ponder, MD;  Location: [facility] OR;  Service: Orthopedics;  Laterality: Right;  . NEUROLYSIS  OF MEDIAN NERVE Right 12/05/2015   Procedure: RIGHT MEDIAN NERVE NEUROLYSIS ;  Surgeon: Dairl Ponder, MD;  Location: [facility] OR;  Service: Orthopedics;  Laterality: Right;  . ORIF DISTAL RADIUS FRACTURE  09/12/11   right  . ORIF WRIST FRACTURE Right 06/15/2013   Procedure: RIGHT RADIUS PSTEOTOMY, DISTAL ULNA RESECTION, AND DIGITAL FLEXOR LENGTHENING AS NEEDED;  Surgeon: Marlowe Shores, MD;  Location: [facility];  Service: Orthopedics;  Laterality: Right;  . PATELLA FRACTURE SURGERY     right  . RADIOLOGY WITH ANESTHESIA N/A 03/09/2014   Procedure: MRI - Cervical and Lumbar without Contrast;  Surgeon: Medication Radiologist, MD;  Location: [facility] OR;  Service: Radiology;  Laterality: N/A;  . RADIOLOGY WITH ANESTHESIA N/A 04/03/2015   Procedure: MRI OF BRAIN    (RADIOLOGY WITH ANESTHESIA);  Surgeon: Medication Radiologist, MD;  Location: [facility] OR;  Service: Radiology;  Laterality: N/A;  . TONSILLECTOMY AND ADENOIDECTOMY         Home Medications    Prior to Admission medications   Medication Sig Start Date End Date Taking? Authorizing Provider  albuterol (PROVENTIL HFA;VENTOLIN HFA) 108 (90 Base) MCG/ACT inhaler Inhale 2 puffs into the lungs every 6 (six) hours as needed for wheezing or shortness of breath. 08/13/16   Mannam, Praveen, MD  albuterol (PROVENTIL) (2.5 MG/3ML) 0.083% nebulizer solution Take 3 mLs (2.5 mg total) by nebulization every 4 (four) hours as needed for wheezing or shortness of breath. 04/17/15   Parrett, Virgel Bouquet, NP  bisacodyl (DULCOLAX) 10 MG suppository Place 1 suppository (10 mg total) rectally as needed for moderate constipation. Patient not taking: Reported on 11/29/2016 01/11/16   Calvert Cantor, MD  diazepam (VALIUM) 10 MG tablet Take 1 tablet (10 mg total) by mouth 3 (three) times daily. Patient not taking: Reported on 11/29/2016 06/06/14   Osvaldo Shipper, MD  DULERA 200-5 MCG/ACT AERO INHALE 2 PUFFS INTO THE LUNGS TWICE A DAY 04/15/16   Mannam, Colbert Coyer, MD    EPINEPHrine (EPIPEN 2-PAK) 0.3 mg/0.3 mL IJ SOAJ injection Inject 0.3 mg into the muscle once as needed (for severe allergic reaction).    [provider]  fluticasone (FLONASE) 50 MCG/ACT nasal spray USE 2 SPRAYS IN EACH NOSTRIL TWICE DAILY. 05/08/16   Mannam, Colbert Coyer, MD  gabapentin (NEURONTIN) 100 MG capsule Take 2 capsules (200 mg total) by mouth 2 (two) times daily. 11/17/16   Charm Rings, NP  hydrOXYzine (ATARAX/VISTARIL) 25 MG tablet Take 1 tablet (25 mg total) by mouth every 6 (six) hours as needed for itching. 11/17/16   Charm Rings, NP  mometasone-formoterol (DULERA) 200-5 MCG/ACT AERO 2 puffs inhaled twice daily. Needs office visit for future refills. Patient taking differently: Inhale 2 puffs into the lungs 2 (two) times daily as needed for wheezing or shortness of breath.  06/03/16   Chilton Greathouse, MD  predniSONE (DELTASONE) 20 MG tablet Take 2 tablets (40 mg total) by mouth daily. 11/25/16   Benjiman Core, MD  PROAIR HFA 108 [phone] Base) MCG/ACT inhaler INHALE 2 PUFFS INTO THE LUNGS EVERY 6 HOURS AS NEEDED FOR SHORTNESS OF BREATH 02/29/16   Mannam, Praveen, MD  sertraline (ZOLOFT) 25 MG tablet Take 1 tablet (25 mg total) by mouth daily. 11/18/16   Charm Rings, NP    Family History Family History  Problem Relation Age of Onset  . Allergies Mother        atopic  . Asthma Mother   . Heart failure Mother        CHF  . Other Father   . Anesthesia problems Neg Hx     Social History Social History  Substance Use Topics  . Smoking status: Former Smoker    Packs/day: 1.00    Years: 3.00    Types: Cigarettes, Pipe    Quit date: 05/20/1983  . Smokeless tobacco: Never Used  . Alcohol use Yes     Comment: "scotch once a year"     Allergies   Bacitracin-polymyxin b; Bee venom; Codeine; Norco [hydrocodone-acetaminophen]; Penicillins; Shellfish-derived products; Other; Tape; Chlorhexidine; and Sulfate   Review of Systems Review of Systems  Constitutional:  Negative for fever.  Respiratory: Negative for shortness of breath.   Cardiovascular: Negative for chest pain.  Gastrointestinal: Negative for abdominal pain.  Genitourinary: Positive for frequency. Negative for discharge, dysuria and penile pain.  Neurological: Negative for headaches.  All other systems reviewed and are negative.    Physical Exam Updated Vital Signs BP (!) 157/107 (BP Location: Right Arm)   Pulse 72   Temp 98.3 F (36.8 C) (Oral)   Resp 16   Ht 5\' 11"  (1.803 m)   Wt 72.6 kg (160 lb)   SpO2 98%   BMI 22.32 kg/m   Physical Exam  Constitutional: He is oriented to person, place, and time. He appears well-developed and well-nourished. No distress.  HENT:  Head: Normocephalic and atraumatic.  Mouth/Throat: No oropharyngeal exudate.  Eyes: Pupils are equal, round, and reactive to light. Conjunctivae are normal.  Neck: Normal range of motion. Neck supple.  Cardiovascular: Normal rate, regular rhythm, normal heart sounds and intact distal pulses.   Pulmonary/Chest: Effort normal and breath sounds normal. No respiratory distress. He has no wheezes. He has no rales.  Abdominal: Soft. Bowel sounds are normal. He exhibits no mass. There is no tenderness. There is no rebound and no guarding.  Musculoskeletal: Normal range of motion.  Neurological: He is alert and oriented to person, place, and time. He displays normal reflexes.  Skin: Skin is warm and dry. Capillary refill takes less than 2 seconds.  Psychiatric: He has a normal mood and affect.     ED Treatments / Results   Vitals:   01/05/17 0159  BP: (!) 157/107  Pulse: 72  Resp: 16  Temp: 98.3 F (36.8 C)  SpO2: 98%    Labs (all labs ordered are listed, but only abnormal results are displayed)  Results for orders placed or performed during the hospital encounter of 01/05/17  Urinalysis, Routine w reflex microscopic- may I&O cath if menses  Result Value Ref Range   Color, Urine STRAW (A) YELLOW    APPearance CLEAR CLEAR   Specific Gravity, Urine 1.009 1.005 - 1.030   pH 6.0 5.0 - 8.0   Glucose, UA NEGATIVE NEGATIVE mg/dL   Hgb urine dipstick NEGATIVE NEGATIVE   Bilirubin Urine NEGATIVE  NEGATIVE   Ketones, ur NEGATIVE NEGATIVE mg/dL   Protein, ur NEGATIVE NEGATIVE mg/dL   Nitrite NEGATIVE NEGATIVE   Leukocytes, UA NEGATIVE NEGATIVE   No results found.  Radiology No results found.  Procedures Procedures (including critical care time)      The patient is very well appearing and has been observed in the ED.  Strict return precautions given for  intractable rash, swelling or the lips tongue or floor of the mouth, chest pain, dyspnea on exertion, wheezing, weakness, vomiting, diarrhea,  Inability to tolerate liquids or food, fevers > 101, rashes on the skin, altered mental status or any concerns. No signs of systemic illness or infection. The patient is nontoxic-appearing on exam and vital signs are within normal limits.    I have reviewed the triage vital signs and the nursing notes. Pertinent labs &imaging results that were available during my care of the patient were reviewed by me and considered in my medical decision making (see chart for details).  After history, exam, and medical workup I feel the patient has been appropriately medically screened and is safe for discharge home. Pertinent diagnoses were discussed with the patient. Patient was given return precautions.      Adan Baehr, MD 01/05/17 1610

## 2017-01-05 NOTE — Clinical Social Work Note (Signed)
Clinical Social Work Assessment  Patient Details  Name: [name] [name] MRN: [id number] Date of Birth: Apr 30, 1951  Date of referral:  01/05/17               Reason for consult:  Transportation                Permission sought to share information with:    Permission granted to share information::  Yes, Verbal Permission Granted  Name::        Agency::     Relationship::     Contact Information:     Housing/Transportation Living arrangements for the past 2 months:  [place] of Information:  Patient Patient Interpreter Needed:  None Criminal Activity/Legal Involvement Pertinent to Current Situation/Hospitalization:    Significant Relationships:  None Lives with:  Self Do you feel safe going back to the place where you live?  Yes Need for family participation in patient care:  No (Coment)  Care giving concerns:  None listed by pt/family   Social Worker assessment / plan:  CSW met with pt and confirmed pt's plan to be discharged back to his homeless shelter to live at discharge.  CSW provided active listening and validated pt's concerns.  Pt has been living independently prior to being admitted to [facility].  Employment status:  Disabled (Comment on whether or not currently receiving Disability) Insurance information:  Medicare, Medicaid In [place] PT Recommendations:  Not assessed at this time Information / Referral to community resources:     Patient/Family's Response to care:  Patient alert and oriented.  Patient agreeable to plan.  Pt pleasant and appreciated CSW intervention.    Patient/Family's Understanding of and Emotional Response to Diagnosis, Current Treatment, and Prognosis:  Still assessing\  Emotional Assessment Appearance:  Appears stated age Attitude/Demeanor/Rapport:  Complaining, Inconsistent, Guarded, Apprehensive, Hostile Affect (typically observed):    Orientation:  Oriented to Self, Oriented to Place, Oriented to  Time, Oriented to  Situation Alcohol / Substance use:    Psych involvement (Current and /or in the community):     Discharge Needs  Concerns to be addressed:  No discharge needs identified Readmission within the last 30 days:  No Current discharge risk:  Lack of support system Barriers to Discharge:  No Barriers Identified   Claudine Mouton, LCSWA 01/05/2017, 7:02 PM

## 2017-01-05 NOTE — Progress Notes (Addendum)
CSW met with the pt and pt states he has been saving his money for an apartment and hopes to get an apartment soon. Per notes pt has stated that staff at [facility] is assisting the pt with housing options.  CSW staffed pt's case with CSW;s Asst Director whom stated since pt has been saving his money he can pay for a taxi wherever he wants to.  CSW has assisted pt with placement twice in the past and pt has refused all placement options despite getting whatever the pt demanded regarding placement options. CSW has repeatedly agreed to placement options and then at the moment of being placed pt has stated he never agreed to those options and refused to be transported.    Pt  Is well-known to both the [facility] ED and the [facility] ED and consistently refuses all assistance and desires to remain in the ED at all costs, per notes and per this CSW.  CSW will ask the [facility], once the provider discharges the pt, to request pt be D/C'd if pt refuses.  CSW will continue to follow for D/C needs.  6:28 PM CSW updated provider, met with pt and stated pt would, after stating he has been saving his money that he would have to now provide his own money and that he also has the option to ride the bus and that a bus pass would be provided.  Pt state, "They don't allow nobody to ride that bus", CSW then stated pt would be allowed to call a taxi.  Pt did not say anything further and CSW left the room as the RN entered and prepared pt for D/C and informed pt his x-rays came back "good" for no broken bones.  RN stated to pt she would call security to escort pt to the bus stop.  Alphonse Guild. [place], LCSWA, Deon Pilling, CSI Clinical Social Worker Ph: [phone]

## 2017-01-05 NOTE — ED Provider Notes (Addendum)
Discharged with depends and flomax.  Needs social work placement    Sascha Baugher, MD 01/05/17 0762    Nicanor Alcon, Lillis Nuttle, MD 01/05/17 2633

## 2017-01-05 NOTE — ED Notes (Signed)
CSW ERIN PRESENT EVALUATING PT

## 2017-01-05 NOTE — ED Notes (Signed)
SECURITY TO ASSIST HIM TO BUS STOP. PT DISCHARGED WITHOUT EVENT

## 2017-01-05 NOTE — ED Provider Notes (Signed)
[facility]-EMERGENCY DEPT Provider Note   CSN: [id number] Arrival date & time: 01/05/17  1305     History   Chief Complaint Chief Complaint  Patient presents with  . Ankle Injury    HPI [name] [name] is a 66 y.o. male.  The history is provided by the patient. No language interpreter was used.  Ankle Injury  This is a new problem. The current episode started 1 to 2 hours ago. The problem has not changed since onset.Nothing aggravates the symptoms. Nothing relieves the symptoms. He has tried nothing for the symptoms. The treatment provided no relief.  Pt reports he twisted his ankle trying to get on the city bus.  Pt reports he tried to get on three buses but drivers would not let him on.   Past Medical History:  Diagnosis Date  . Anxiety   . Arthritis    spine  . Chronic lower back pain   . Contact dermatitis and other eczema, due to unspecified cause   . COPD (chronic obstructive pulmonary disease) (HCC)   . Cough   . Depression    patient constantly hitting left side of face "due to pain"  . Enlarged prostate   . Esophageal reflux   . H/O hiatal hernia   . Headache    PMH: migraines  . Hep C w/o coma, chronic (HCC) early 2013  . History of eye prosthesis   . History of kidney stones   . Hypertension    not on medication at this time  . 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 Multiple facial bone fractures (HCC)   . Multiple open wounds    "from esczema- scratching"  . Osteopenia   . Pancreatitis   . Pneumonia    in past had several times  . Seasonal allergies   . Ulcerative colitis   . Unspecified asthma(493.90)   . Unspecified sinusitis (chronic)     Patient Active Problem List   Diagnosis Date Noted  . 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 Major depressive disorder with psychotic features (HCC) 11/16/2016  . Lumbar back pain 02/04/2016  . AKI (acute kidney injury) (HCC) 01/08/2016  . Closed  compression fracture of L3 lumbar vertebra (HCC) 01/08/2016  . Pruritus 10/01/2015  . Acute maxillary sinusitis 06/03/2014  . Severe asthma   . Acute on chronic respiratory failure (HCC) 05/31/2014  . Chronic cough 05/31/2014  . Asthma with acute exacerbation 05/31/2014  . Chronic pain syndrome 05/31/2014  . Malnutrition of moderate degree (HCC) 05/30/2014  . Hypokalemia 05/28/2014  . SOB (shortness of breath) 05/28/2014  . Weakness of back 02/03/2014  . Weakness of both legs 02/03/2014  . Gait instability 12/01/2013  . Allergic contact dermatitis 12/24/2011  . 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 Wrist joint infection (HCC) 10/10/2011    Class: Diagnosis of  . Urinary retention 09/16/2011  . 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 TACHYCARDIA 07/16/2010  . BLINDNESS 10/27/2008  . Sinusitis, chronic 08/03/2007  . Allergic rhinitis 08/03/2007  . Asthma 08/03/2007  . Esophageal reflux 08/03/2007  . ECZEMA 08/03/2007  . OSTEOPENIA 08/03/2007  . ULCERATIVE COLITIS, HX OF 08/03/2007    Past Surgical History:  Procedure Laterality Date  . CARPAL TUNNEL RELEASE Right 06/15/2013   Procedure: RIGHT CARPAL TUNNEL RELEASE;  Surgeon: Marlowe Shores, MD;  Location: [facility];  Service: Orthopedics;  Laterality: Right;  . COLONOSCOPY W/ BIOPSIES AND POLYPECTOMY    .  ENUCLEATION Left   . EYE SURGERY     3 Corneal transplant (bilateral eyes).    . FRACTURE SURGERY    . HARDWARE REMOVAL  10/09/2011   Procedure: HARDWARE REMOVAL;  Surgeon: Nadara Mustard, MD;  Location: [facility] OR;  Service: Orthopedics;  Laterality: Right;  Removal Deep Hardware Right Wrist, placement antibiotic beads.  . INGUINAL HERNIA REPAIR Left    age 42  . Litrotripsy    . metacarpal pinning Right    right hand  . NERVE, TENDON AND ARTERY REPAIR Right 12/05/2015   Procedure: RIGHT WRIST, HAND FLEXOR TENDON LENGTHENING;  Surgeon: Dairl Ponder, MD;  Location: [facility] OR;  Service: Orthopedics;  Laterality:  Right;  . NEUROLYSIS OF MEDIAN NERVE Right 12/05/2015   Procedure: RIGHT MEDIAN NERVE NEUROLYSIS ;  Surgeon: Dairl Ponder, MD;  Location: [facility] OR;  Service: Orthopedics;  Laterality: Right;  . ORIF DISTAL RADIUS FRACTURE  09/12/11   right  . ORIF WRIST FRACTURE Right 06/15/2013   Procedure: RIGHT RADIUS PSTEOTOMY, DISTAL ULNA RESECTION, AND DIGITAL FLEXOR LENGTHENING AS NEEDED;  Surgeon: Marlowe Shores, MD;  Location: [facility];  Service: Orthopedics;  Laterality: Right;  . PATELLA FRACTURE SURGERY     right  . RADIOLOGY WITH ANESTHESIA N/A 03/09/2014   Procedure: MRI - Cervical and Lumbar without Contrast;  Surgeon: Medication Radiologist, MD;  Location: [facility] OR;  Service: Radiology;  Laterality: N/A;  . RADIOLOGY WITH ANESTHESIA N/A 04/03/2015   Procedure: MRI OF BRAIN    (RADIOLOGY WITH ANESTHESIA);  Surgeon: Medication Radiologist, MD;  Location: [facility] OR;  Service: Radiology;  Laterality: N/A;  . TONSILLECTOMY AND ADENOIDECTOMY         Home Medications    Prior to Admission medications   Medication Sig Start Date End Date Taking? Authorizing Provider  albuterol (PROVENTIL HFA;VENTOLIN HFA) 108 (90 Base) MCG/ACT inhaler Inhale 2 puffs into the lungs every 6 (six) hours as needed for wheezing or shortness of breath. 08/13/16   Mannam, Praveen, MD  albuterol (PROVENTIL) (2.5 MG/3ML) 0.083% nebulizer solution Take 3 mLs (2.5 mg total) by nebulization every 4 (four) hours as needed for wheezing or shortness of breath. 04/17/15   Parrett, Virgel Bouquet, NP  bisacodyl (DULCOLAX) 10 MG suppository Place 1 suppository (10 mg total) rectally as needed for moderate constipation. Patient not taking: Reported on 11/29/2016 01/11/16   Calvert Cantor, MD  diazepam (VALIUM) 10 MG tablet Take 1 tablet (10 mg total) by mouth 3 (three) times daily. Patient not taking: Reported on 11/29/2016 06/06/14   Osvaldo Shipper, MD  DULERA 200-5 MCG/ACT AERO INHALE 2 PUFFS INTO THE LUNGS TWICE A DAY 04/15/16    Mannam, Colbert Coyer, MD  EPINEPHrine (EPIPEN 2-PAK) 0.3 mg/0.3 mL IJ SOAJ injection Inject 0.3 mg into the muscle once as needed (for severe allergic reaction).    [provider]  fluticasone (FLONASE) 50 MCG/ACT nasal spray USE 2 SPRAYS IN EACH NOSTRIL TWICE DAILY. 05/08/16   Mannam, Colbert Coyer, MD  gabapentin (NEURONTIN) 100 MG capsule Take 2 capsules (200 mg total) by mouth 2 (two) times daily. 11/17/16   Charm Rings, NP  hydrOXYzine (ATARAX/VISTARIL) 25 MG tablet Take 1 tablet (25 mg total) by mouth every 6 (six) hours as needed for itching. 11/17/16   Charm Rings, NP  mometasone-formoterol (DULERA) 200-5 MCG/ACT AERO 2 puffs inhaled twice daily. Needs office visit for future refills. Patient taking differently: Inhale 2 puffs into the lungs 2 (two) times daily as needed for wheezing or  shortness of breath.  06/03/16   Mannam, Colbert Coyer, MD  predniSONE (DELTASONE) 20 MG tablet Take 2 tablets (40 mg total) by mouth daily. Patient not taking: Reported on 01/05/2017 11/25/16   Benjiman Core, MD  sertraline (ZOLOFT) 25 MG tablet Take 1 tablet (25 mg total) by mouth daily. 11/18/16   Charm Rings, NP  tamsulosin (FLOMAX) 0.4 MG CAPS capsule Take 1 capsule (0.4 mg total) by mouth daily. 01/05/17   Palumbo, April, MD    Family History Family History  Problem Relation Age of Onset  . Allergies Mother        atopic  . Asthma Mother   . Heart failure Mother        CHF  . Other Father   . Anesthesia problems Neg Hx     Social History Social History  Substance Use Topics  . Smoking status: Former Smoker    Packs/day: 1.00    Years: 3.00    Types: Cigarettes, Pipe    Quit date: 05/20/1983  . Smokeless tobacco: Never Used  . Alcohol use Yes     Comment: "scotch once a year"     Allergies   Bacitracin-polymyxin b; Bee venom; Codeine; Norco [hydrocodone-acetaminophen]; Penicillins; Shellfish-derived products; Other; Tape; Chlorhexidine; and Sulfate   Review of Systems Review of  Systems  All other systems reviewed and are negative.    Physical Exam Updated Vital Signs BP (!) 148/107   Pulse 82   Temp 98.2 F (36.8 C) (Oral)   Resp 16   SpO2 99%   Physical Exam  Constitutional: He appears well-developed and well-nourished.  HENT:  Head: Normocephalic and atraumatic.  Eyes:  blind  Neck: Neck supple.  Cardiovascular: Normal rate and regular rhythm.   No murmur heard. Pulmonary/Chest: Effort normal and breath sounds normal. No respiratory distress.  Abdominal: Soft. There is no tenderness.  Musculoskeletal: He exhibits edema and tenderness.  Swollen tender right ankle,  Pain with movement. nv and ns intact  Neurological: He is alert.  Skin: Skin is warm and dry.  Psychiatric: He has a normal mood and affect.  Nursing note and vitals reviewed.    ED Treatments / Results  Labs (all labs ordered are listed, but only abnormal results are displayed) Labs Reviewed - No data to display  EKG  EKG Interpretation None       Radiology Dg Ankle Complete Right  Result Date: 01/05/2017 CLINICAL DATA:  Right ankle pain after twisting injury. EXAM: RIGHT ANKLE - COMPLETE 3+ VIEW COMPARISON:  None. FINDINGS: There is no evidence of fracture, dislocation, or joint effusion. There is no evidence of arthropathy or other focal bone abnormality. Vascular calcifications are noted. Soft tissue swelling is noted overlying lateral malleolus. IMPRESSION: No fracture or dislocation is noted. Soft tissue swelling seen overlying lateral malleolus suggesting ligamentous injury. Electronically Signed   By: Lupita Raider, M.D.   On: 01/05/2017 17:12    Procedures Procedures (including critical care time)  Medications Ordered in ED Medications - No data to display   Initial Impression / Assessment and Plan / ED Course  I have reviewed the triage vital signs and the nursing notes.  Pertinent labs & imaging results that were available during my care of the patient  were reviewed by me and considered in my medical decision making (see chart for details).     Pt placed in an aso.   Social work spoke with pt.  Pt has a Chiropractor.  See notes for  details   Final Clinical Impressions(s) / ED Diagnoses   Final diagnoses:  Sprain of right ankle, unspecified ligament, initial encounter    New Prescriptions Discharge Medication List as of 01/05/2017  5:20 PM    An After Visit Summary was printed and given to the patient.   Osie Cheeks 01/05/17 2204    Pricilla Loveless, MD 01/07/17 [phone]

## 2017-01-05 NOTE — ED Notes (Addendum)
PT IS IN NEED OF PERSONAL HYGIENE. PT GIVEN NECESSARY BATHING ITEMS TO CARE FOR HIMSELF BEFORE DISCHARGE PT INFORMED THIS WRITER AND CSW THAT CSW WAS GOING TO MEET HIM AT SHELTER TO FIND HIM A HOME.

## 2017-01-05 NOTE — ED Notes (Signed)
Social work at bedside.  

## 2017-01-05 NOTE — ED Notes (Signed)
ED Provider at bedside. EDP STATES PT IS ORIENTED AND ABLE TO CARE FOR SELF. OKAY TO DISCHARGE

## 2017-01-05 NOTE — ED Notes (Signed)
Bed: WA09 Expected date:  Expected time:  Means of arrival:  Comments: 35 m combative

## 2017-01-05 NOTE — ED Notes (Addendum)
[name] [name] SPOKE WITH PT AGAIN EXPLAINING DISCHARGE

## 2017-01-05 NOTE — Progress Notes (Signed)
CSW met with pt at bedside, alongside MD to assess pt orientation and Social Work consult. Pt has been to ED several times over the two months. Per MD, pt is oriented x4. Pt states he is living at [facility] "temporarily" after his house was condemned.  CSW discussed placement options and pt continues to refuse placement, stating he is not "going there" due to not wanting to "give up" check.   Pt's plan is to return to [facility] upon discharge. Pt states someone at [facility] is working with pt for placement or an apartment, but could not provide a name. CSW will provide RN with bus pass for pt upon discharge. CSW signing off as no further Social Work needs identified.   Oretha Ellis, [place], [place] Work [phone]

## 2017-01-05 NOTE — ED Notes (Signed)
Bed: WA09 Expected date:  Expected time:  Means of arrival:  Comments: PT in rm

## 2017-01-05 NOTE — ED Triage Notes (Signed)
Pt here with c/o right ankle pain following twisting his ankle trying to get on the bus. Pt states he tried to get on the bus 3 times and they would not let him. Pt states he would like to know why. Pt states his ankle only hurts when he puts weight on it. Pt has full ROM

## 2017-01-05 NOTE — ED Notes (Signed)
Bladder scan results 157 mL.

## 2017-01-06 ENCOUNTER — Encounter: Payer: Self-pay | Admitting: *Deleted

## 2017-01-06 NOTE — Progress Notes (Signed)
Pt showed up in [facility] ED stating he was to meet with DSS.  EDCM contacted DSS outreach in [place] to find that pt does not have appointment with them.  EDCM contacted [facility] PATH liaison Trinna Post) who states pt lives at [facility].  Trinna Post will pick up pt when he gets a chance which may not be until afternoon.  EDCM communicated information to ED Staff.

## 2017-01-06 NOTE — Progress Notes (Signed)
Alex from [facility] came to pick pt up and return him to [facility].  EDCM and EDSW met with Cristie Hem to come up with care plan for pt going forward.  Pt present and participated in development of care plan. Care Plan for pt:  Contact Cristie Hem ([place]) [phone]- if Cristie Hem is available, he will pick up pt and take him to shelter if Cristie Hem is not available, complete APS report.

## 2017-01-13 ENCOUNTER — Emergency Department (HOSPITAL_COMMUNITY): Payer: Medicare Other

## 2017-01-13 ENCOUNTER — Encounter (HOSPITAL_COMMUNITY): Payer: Self-pay | Admitting: Emergency Medicine

## 2017-01-13 ENCOUNTER — Emergency Department (HOSPITAL_COMMUNITY)
Admission: EM | Admit: 2017-01-13 | Discharge: 2017-01-15 | Disposition: A | Discharge status: Home / Self Care | Payer: Medicare Other | Attending: Emergency Medicine | Admitting: Emergency Medicine

## 2017-01-13 DIAGNOSIS — J45909 Unspecified asthma, uncomplicated: Secondary | ICD-10-CM | POA: Insufficient documentation

## 2017-01-13 DIAGNOSIS — Z5900 Homelessness unspecified: Secondary | ICD-10-CM

## 2017-01-13 DIAGNOSIS — Z87891 Personal history of nicotine dependence: Secondary | ICD-10-CM | POA: Insufficient documentation

## 2017-01-13 DIAGNOSIS — Z598 Other problems related to housing and economic circumstances: Secondary | ICD-10-CM | POA: Diagnosis not present

## 2017-01-13 DIAGNOSIS — Z029 Encounter for administrative examinations, unspecified: Secondary | ICD-10-CM | POA: Insufficient documentation

## 2017-01-13 DIAGNOSIS — Z880 Allergy status to penicillin: Secondary | ICD-10-CM | POA: Diagnosis not present

## 2017-01-13 DIAGNOSIS — Z91030 Bee allergy status: Secondary | ICD-10-CM | POA: Diagnosis not present

## 2017-01-13 DIAGNOSIS — F4325 Adjustment disorder with mixed disturbance of emotions and conduct: Secondary | ICD-10-CM | POA: Diagnosis not present

## 2017-01-13 DIAGNOSIS — Z91013 Allergy to seafood: Secondary | ICD-10-CM | POA: Diagnosis not present

## 2017-01-13 DIAGNOSIS — M25571 Pain in right ankle and joints of right foot: Secondary | ICD-10-CM | POA: Diagnosis not present

## 2017-01-13 DIAGNOSIS — J449 Chronic obstructive pulmonary disease, unspecified: Secondary | ICD-10-CM | POA: Diagnosis not present

## 2017-01-13 LAB — RAPID URINE DRUG SCREEN, HOSP PERFORMED
Amphetamines: NOT DETECTED
Barbiturates: NOT DETECTED
Benzodiazepines: POSITIVE — AB
Cocaine: NOT DETECTED
Opiates: NOT DETECTED
Tetrahydrocannabinol: NOT DETECTED

## 2017-01-13 LAB — BASIC METABOLIC PANEL
Anion gap: 9 (ref 5–15)
BUN: 16 mg/dL (ref 6–20)
CO2: 23 mmol/L (ref 22–32)
Calcium: 8.5 mg/dL — ABNORMAL LOW (ref 8.9–10.3)
Chloride: 109 mmol/L (ref 101–111)
Creatinine, Ser: 1 mg/dL (ref 0.61–1.24)
GFR calc Af Amer: >60 mL/min (ref >60)
GFR calc non Af Amer: >60 mL/min (ref >60)
Glucose, Bld: 95 mg/dL (ref 65–99)
Potassium: 3.5 mmol/L (ref 3.5–5.1)
Sodium: 141 mmol/L (ref 135–145)

## 2017-01-13 LAB — CBC WITH DIFFERENTIAL/PLATELET
Basophils Absolute: 0 10*3/uL (ref 0.0–0.1)
Basophils Relative: 0 %
Eosinophils Absolute: 0.6 10*3/uL (ref 0.0–0.7)
Eosinophils Relative: 8 %
HCT: 35.5 % — ABNORMAL LOW (ref 39.0–52.0)
Hemoglobin: 12 g/dL — ABNORMAL LOW (ref 13.0–17.0)
Lymphocytes Relative: 15 %
Lymphs Abs: 1.2 10*3/uL (ref 0.7–4.0)
MCH: 31.7 pg (ref 26.0–34.0)
MCHC: 33.8 g/dL (ref 30.0–36.0)
MCV: 93.7 fL (ref 78.0–100.0)
Monocytes Absolute: 0.5 10*3/uL (ref 0.1–1.0)
Monocytes Relative: 6 %
Neutro Abs: 5.3 10*3/uL (ref 1.7–7.7)
Neutrophils Relative %: 71 %
Platelets: 196 10*3/uL (ref 150–400)
RBC: 3.79 MIL/uL — ABNORMAL LOW (ref 4.22–5.81)
RDW: 13.9 % (ref 11.5–15.5)
WBC: 7.6 10*3/uL (ref 4.0–10.5)

## 2017-01-13 LAB — URINALYSIS, ROUTINE W REFLEX MICROSCOPIC
Bilirubin Urine: NEGATIVE
Glucose, UA: NEGATIVE mg/dL
Hgb urine dipstick: NEGATIVE
Ketones, ur: NEGATIVE mg/dL
Leukocytes, UA: NEGATIVE
Nitrite: NEGATIVE
Protein, ur: NEGATIVE mg/dL
Specific Gravity, Urine: 1.025 (ref 1.005–1.030)
pH: 5 (ref 5.0–8.0)

## 2017-01-13 NOTE — ED Notes (Signed)
Social worker returned call --

## 2017-01-13 NOTE — Discharge Instructions (Addendum)
Follow up with your family doctor.  Get rechecked if you have any new or concerning symptoms.

## 2017-01-13 NOTE — ED Triage Notes (Signed)
Pt went to [facility] last week and tripped as he was leaving- right ankle pain. Pt has been evaluated since then. Pt here for continued pain. Pt lives at homeless shelter and EMS reports that staff at shelter want him to have more advanced care- like long term facility.

## 2017-01-13 NOTE — ED Notes (Signed)
Pt sent here from homeless shelter-- unable to continue to live there-- because patient refused to shower (per pt)-- pt is blind, wheelchair bound---pt is uncooperative, states that  "I am not going to a nursing home-- " pt unaware of what year it is, thinks it is 6, states he is 66 years old, then is able to tell staff when his birthday is. Pt has no family here, no family to call, no place to go, but to the streets.

## 2017-01-13 NOTE — ED Provider Notes (Signed)
[facility]-EMERGENCY DEPT Provider Note   CSN: [id number] Arrival date & time: 01/13/17  1351     History   Chief Complaint Chief Complaint  Patient presents with  . social issues  . Homeless    HPI [name] [name] is a 66 y.o. male.  HPI   66 year old male with extensive problem list presents today at the request of homeless shelter.  Patient currently staying in homeless shelter, per EMS they requested patient be brought to the emergency room as they will no longer allow him to stay there.  They feel that he is incapable of managing his own healthcare, and unwilling to shower.    For my evaluation patient denies any significant complaints.  He reports he occasionally has right ankle pain, none presently.  Patient very reluctant to any conversation reporting that "I do not want to play these games".  When asked about person place time and event, he reports the answers of the same is I said earlier.  Patient will not continue on with conversation on being alert and oriented.  When asked what patient would like, he reports he like to go home.  When asked where home as he will not respond.  When asked if he would like to be placed into a care facility where they could help with his daily needs he reports he would not like this.  Patient would like to go to a homeless shelter.   Past Medical History:  Diagnosis Date  . Anxiety   . Arthritis    spine  . Chronic lower back pain   . Contact dermatitis and other eczema, due to unspecified cause   . COPD (chronic obstructive pulmonary disease) (HCC)   . Cough   . Depression    patient constantly hitting left side of face "due to pain"  . Enlarged prostate   . Esophageal reflux   . H/O hiatal hernia   . Headache    PMH: migraines  . Hep C w/o coma, chronic (HCC) early 2013  . History of eye prosthesis   . History of kidney stones   . Hypertension    not on medication at this time  . 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 Multiple facial bone fractures (HCC)   . Multiple open wounds    "from esczema- scratching"  . Osteopenia   . Pancreatitis   . Pneumonia    in past had several times  . Seasonal allergies   . Ulcerative colitis   . Unspecified asthma(493.90)   . Unspecified sinusitis (chronic)     Patient Active Problem List   Diagnosis Date Noted  . 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 Major depressive disorder with psychotic features (HCC) 11/16/2016  . Lumbar back pain 02/04/2016  . AKI (acute kidney injury) (HCC) 01/08/2016  . Closed compression fracture of L3 lumbar vertebra (HCC) 01/08/2016  . Pruritus 10/01/2015  . Acute maxillary sinusitis 06/03/2014  . Severe asthma   . Acute on chronic respiratory failure (HCC) 05/31/2014  . Chronic cough 05/31/2014  . Asthma with acute exacerbation 05/31/2014  . Chronic pain syndrome 05/31/2014  . Malnutrition of moderate degree (HCC) 05/30/2014  . Hypokalemia 05/28/2014  . SOB (shortness of breath) 05/28/2014  . Weakness of back 02/03/2014  . Weakness of both legs 02/03/2014  . Gait instability 12/01/2013  . Allergic contact dermatitis 12/24/2011  . 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 Wrist  joint infection (HCC) 10/10/2011    Class: Diagnosis of  . Urinary retention 09/16/2011  . 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 TACHYCARDIA 07/16/2010  . BLINDNESS 10/27/2008  . Sinusitis, chronic 08/03/2007  . Allergic rhinitis 08/03/2007  . Asthma 08/03/2007  . Esophageal reflux 08/03/2007  . ECZEMA 08/03/2007  . OSTEOPENIA 08/03/2007  . ULCERATIVE COLITIS, HX OF 08/03/2007    Past Surgical History:  Procedure Laterality Date  . CARPAL TUNNEL RELEASE Right 06/15/2013   Procedure: RIGHT CARPAL TUNNEL RELEASE;  Surgeon: Marlowe Shores, MD;  Location: [facility];  Service: Orthopedics;  Laterality: Right;  . COLONOSCOPY W/ BIOPSIES AND POLYPECTOMY    . ENUCLEATION Left   .  EYE SURGERY     3 Corneal transplant (bilateral eyes).    . FRACTURE SURGERY    . HARDWARE REMOVAL  10/09/2011   Procedure: HARDWARE REMOVAL;  Surgeon: Nadara Mustard, MD;  Location: [facility] OR;  Service: Orthopedics;  Laterality: Right;  Removal Deep Hardware Right Wrist, placement antibiotic beads.  . INGUINAL HERNIA REPAIR Left    age 63  . Litrotripsy    . metacarpal pinning Right    right hand  . NERVE, TENDON AND ARTERY REPAIR Right 12/05/2015   Procedure: RIGHT WRIST, HAND FLEXOR TENDON LENGTHENING;  Surgeon: Dairl Ponder, MD;  Location: [facility] OR;  Service: Orthopedics;  Laterality: Right;  . NEUROLYSIS OF MEDIAN NERVE Right 12/05/2015   Procedure: RIGHT MEDIAN NERVE NEUROLYSIS ;  Surgeon: Dairl Ponder, MD;  Location[facility] OR;  Service: Orthopedics;  Laterality: Right;  . ORIF DISTAL RADIUS FRACTURE  09/12/11   right  . ORIF WRIST FRACTURE Right 06/15/2013   Procedure: RIGHT RADIUS PSTEOTOMY, DISTAL ULNA RESECTION, AND DIGITAL FLEXOR LENGTHENING AS NEEDED;  Surgeon: Marlowe Shores, MD;  Location: [facility];  Service: Orthopedics;  Laterality: Right;  . PATELLA FRACTURE SURGERY     right  . RADIOLOGY WITH ANESTHESIA N/A 03/09/2014   Procedure: MRI - Cervical and Lumbar without Contrast;  Surgeon: Medication Radiologist, MD;  Location: [facility] OR;  Service: Radiology;  Laterality: N/A;  . RADIOLOGY WITH ANESTHESIA N/A 04/03/2015   Procedure: MRI OF BRAIN    (RADIOLOGY WITH ANESTHESIA);  Surgeon: Medication Radiologist, MD;  Location: [facility] OR;  Service: Radiology;  Laterality: N/A;  . TONSILLECTOMY AND ADENOIDECTOMY         Home Medications    Prior to Admission medications   Medication Sig Start Date End Date Taking? Authorizing Provider  fluticasone (FLONASE) 50 MCG/ACT nasal spray USE 2 SPRAYS IN EACH NOSTRIL TWICE DAILY. 05/08/16  Yes Mannam, Praveen, MD  gabapentin (NEURONTIN) 100 MG capsule Take 2 capsules (200 mg total) by mouth 2 (two) times daily. 11/17/16  Yes  Lord, Herminio Heads, NP  mometasone-formoterol (DULERA) 200-5 MCG/ACT AERO 2 puffs inhaled twice daily. Needs office visit for future refills. Patient taking differently: Inhale 2 puffs into the lungs 2 (two) times daily as needed for wheezing or shortness of breath.  06/03/16  Yes Mannam, Praveen, MD  albuterol (PROVENTIL HFA;VENTOLIN HFA) 108 (90 Base) MCG/ACT inhaler Inhale 2 puffs into the lungs every 6 (six) hours as needed for wheezing or shortness of breath. 08/13/16   Mannam, Praveen, MD  albuterol (PROVENTIL) (2.5 MG/3ML) 0.083% nebulizer solution Take 3 mLs (2.5 mg total) by nebulization every 4 (four) hours as needed for wheezing or shortness of breath. 04/17/15   Parrett, Virgel Bouquet, NP  bisacodyl (DULCOLAX) 10 MG suppository Place 1 suppository (10 mg total) rectally  as needed for moderate constipation. Patient not taking: Reported on 11/29/2016 01/11/16   Calvert Cantor, MD  diazepam (VALIUM) 10 MG tablet Take 1 tablet (10 mg total) by mouth 3 (three) times daily. Patient not taking: Reported on 11/29/2016 06/06/14   Osvaldo Shipper, MD  DULERA 200-5 MCG/ACT AERO INHALE 2 PUFFS INTO THE LUNGS TWICE A DAY Patient not taking: Reported on 01/13/2017 04/15/16   Chilton Greathouse, MD  hydrOXYzine (ATARAX/VISTARIL) 25 MG tablet Take 1 tablet (25 mg total) by mouth every 6 (six) hours as needed for itching. 11/17/16   Charm Rings, NP  predniSONE (DELTASONE) 20 MG tablet Take 2 tablets (40 mg total) by mouth daily. Patient not taking: Reported on 01/05/2017 11/25/16   Benjiman Core, MD  sertraline (ZOLOFT) 25 MG tablet Take 1 tablet (25 mg total) by mouth daily. 11/18/16   Charm Rings, NP  tamsulosin (FLOMAX) 0.4 MG CAPS capsule Take 1 capsule (0.4 mg total) by mouth daily. 01/05/17   Palumbo, April, MD    Family History Family History  Problem Relation Age of Onset  . Allergies Mother        atopic  . Asthma Mother   . Heart failure Mother        CHF  . Other Father   . Anesthesia problems Neg  Hx     Social History Social History  Substance Use Topics  . Smoking status: Former Smoker    Packs/day: 1.00    Years: 3.00    Types: Cigarettes, Pipe    Quit date: 05/20/1983  . Smokeless tobacco: Never Used  . Alcohol use Yes     Comment: "scotch once a year"     Allergies   Bacitracin-polymyxin b; Bee venom; Codeine; Norco [hydrocodone-acetaminophen]; Penicillins; Shellfish-derived products; Other; Tape; Chlorhexidine; and Sulfate   Review of Systems Review of Systems  All other systems reviewed and are negative.    Physical Exam Updated Vital Signs BP 138/78 (BP Location: Right Arm)   Pulse 97   Temp 98.3 F (36.8 C) (Oral)   Resp 16   SpO2 98%   Physical Exam  Constitutional: He is oriented to person, place, and time. He appears well-developed and well-nourished.  HENT:  Head: Normocephalic and atraumatic.  Left prosthetic eye, right enucleated   Eyes: Pupils are equal, round, and reactive to light. Conjunctivae are normal. Right eye exhibits no discharge. Left eye exhibits no discharge. No scleral icterus.  Neck: Normal range of motion. No JVD present. No tracheal deviation present.  Pulmonary/Chest: Effort normal. No stridor.  Musculoskeletal:  Swelling to bilateral feet, no redness warmth or TTP  Neurological: He is alert and oriented to person, place, and time. Coordination normal.  Psychiatric: He has a normal mood and affect. His behavior is normal. Judgment and thought content normal.  Nursing note and vitals reviewed.    ED Treatments / Results  Labs (all labs ordered are listed, but only abnormal results are displayed) Labs Reviewed  CBC WITH DIFFERENTIAL/PLATELET - Abnormal; Notable for the following:       Result Value   RBC 3.79 (*)    Hemoglobin 12.0 (*)    HCT 35.5 (*)    All other components within normal limits  BASIC METABOLIC PANEL - Abnormal; Notable for the following:    Calcium 8.5 (*)    All other components within normal  limits  URINALYSIS, ROUTINE W REFLEX MICROSCOPIC  RAPID URINE DRUG SCREEN, HOSP PERFORMED    EKG  EKG Interpretation  None       Radiology Dg Ankle Complete Right  Result Date: 01/13/2017 CLINICAL DATA:  Swollen right ankle status post ankle injury. EXAM: RIGHT ANKLE - COMPLETE 3+ VIEW COMPARISON:  January 05, 2017 FINDINGS: There is no evidence of fracture, dislocation, or joint effusion. There is no evidence of arthropathy or other focal bone abnormality. Generalized soft tissue swelling is identified around the ankle. IMPRESSION: No acute fracture or dislocation. Generalized soft tissue swelling around the ankle. Electronically Signed   By: Sherian Rein M.D.   On: 01/13/2017 14:31    Procedures Procedures (including critical care time)  Medications Ordered in ED Medications - No data to display   Initial Impression / Assessment and Plan / ED Course  I have reviewed the triage vital signs and the nursing notes.  Pertinent labs & imaging results that were available during my care of the patient were reviewed by me and considered in my medical decision making (see chart for details).      Final Clinical Impressions(s) / ED Diagnoses   Final diagnoses:  Homeless    66 year old male presents from homeless shelter at their request.  Patient is blind, currently staying at the homeless shelter, but will not allowed to stay there anymore.  Patient does not want to be placed in any facilities.  Patient is unwilling to converse with me, I have low suspicion that he is acutely altered or unable to provide the correct information.  At this time I am unable to completely assess his capability of living independently.  Patient will need social work consultation.  Patient will be monitored here overnight with social work evaluation and disposition in the morning.   New Prescriptions New Prescriptions   No medications on file     Rosalio Loud 01/13/17 2045    Pricilla Loveless, MD 01/14/17 Moses Manners

## 2017-01-14 DIAGNOSIS — Z029 Encounter for administrative examinations, unspecified: Secondary | ICD-10-CM | POA: Diagnosis not present

## 2017-01-14 MED ORDER — PREDNISONE 20 MG PO TABS
40.0000 mg | ORAL_TABLET | Freq: Every day | ORAL | Status: DC
  Administered 2017-01-14 – 2017-01-15 (×2): 40 mg via ORAL
  Filled 2017-01-14 (×2): qty 2

## 2017-01-14 MED ORDER — ALBUTEROL SULFATE (2.5 MG/3ML) 0.083% IN NEBU: 2.5000 mg | INHALATION_SOLUTION | RESPIRATORY_TRACT | Status: DC | PRN

## 2017-01-14 MED ORDER — BISACODYL 10 MG RE SUPP
10.0000 mg | RECTAL | Status: DC | PRN
Start: 2017-01-14 — End: 2017-01-15

## 2017-01-14 MED ORDER — TAMSULOSIN HCL 0.4 MG PO CAPS
0.4000 mg | ORAL_CAPSULE | Freq: Every day | ORAL | Status: DC
  Administered 2017-01-14 – 2017-01-15 (×2): 0.4 mg via ORAL
  Filled 2017-01-14 (×2): qty 1

## 2017-01-14 MED ORDER — SERTRALINE HCL 50 MG PO TABS
25.0000 mg | ORAL_TABLET | Freq: Every day | ORAL | Status: DC
  Administered 2017-01-14 – 2017-01-15 (×2): 25 mg via ORAL
  Filled 2017-01-14 (×2): qty 1

## 2017-01-14 MED ORDER — FLUTICASONE PROPIONATE 50 MCG/ACT NA SUSP
2.0000 | Freq: Two times a day (BID) | NASAL | Status: DC
  Filled 2017-01-14 (×2): qty 16

## 2017-01-14 MED ORDER — GABAPENTIN 100 MG PO CAPS
200.0000 mg | ORAL_CAPSULE | Freq: Two times a day (BID) | ORAL | Status: DC
  Administered 2017-01-14 – 2017-01-15 (×2): 200 mg via ORAL
  Filled 2017-01-14 (×2): qty 2

## 2017-01-14 MED ORDER — ALBUTEROL SULFATE HFA 108 (90 BASE) MCG/ACT IN AERS
2.0000 | INHALATION_SPRAY | Freq: Four times a day (QID) | RESPIRATORY_TRACT | Status: DC | PRN
Start: 2017-01-14 — End: 2017-01-15

## 2017-01-14 MED ORDER — DIAZEPAM 5 MG PO TABS
10.0000 mg | ORAL_TABLET | Freq: Three times a day (TID) | ORAL | Status: DC
  Administered 2017-01-14 – 2017-01-15 (×2): 10 mg via ORAL
  Filled 2017-01-14 (×2): qty 2

## 2017-01-14 MED ORDER — HYDROXYZINE HCL 25 MG PO TABS: 25.0000 mg | ORAL_TABLET | Freq: Four times a day (QID) | ORAL | Status: DC | PRN

## 2017-01-14 NOTE — ED Notes (Signed)
Pt given dinner tray, tech assisted pt with eating.

## 2017-01-14 NOTE — Progress Notes (Signed)
CSW consulted with Wandra Mannan [facility] Supervisor) and was instructed to contact Medical  Director Dr A. CSW left VM was Dr A to return CSW phone call. CSW called [facility]PS to make a report, however CSW had to leave a VM for APS to call CSW back.    Claude Manges Tylan Kinn, MSW, LCSW-A Emergency Department Clinical Social Worker [phone]

## 2017-01-14 NOTE — ED Notes (Signed)
Patient is resting on the stretcher listening to TV , lunch tray  Ordered.

## 2017-01-14 NOTE — [facility] Counselor (Signed)
Reviewed TTS consult, spoke with attending physician and social worker.  Determined per conversation with Chilton Greathouse. Lewis, NP that TTS consult is not appropriate for patient as he does not present with immediate mental health concerns (e.g., suicidal, homicidal ideation, hallucination).  Per discussion with social work and attending physician, the Pt would be better served with capacity determination made by psychiatrist.  Denny [facility], asked her to request that TTS consult be pulled and that attending physician call to request psych consult.

## 2017-01-14 NOTE — Progress Notes (Signed)
CSW spoke with Dendra [facility] about [facility]. Dendra informed CSW that APS has had multiple encounters with pt in the past where pt refused services that APS had to offer pt. CSW informed Dendra that [facility] and has sought placement and then refuse when its time time to discharge to the facility. CSW was informed by Dendra that in previously working with pt, pt has denied the services that they offered and appeared to have capacity in begin able to make decisions and does not meet criteria to have a legal guardian. CSW was asked to send over a letter that was given to RN CM and CSW by the director of [facility].   Dendra took APS report from CSW and RN CM.     Claude MangesKierra S. Bruce Mayers, MSW, LCSW-A Emergency Department Clinical Social Worker [phone]

## 2017-01-14 NOTE — Progress Notes (Signed)
CSW was informed that [name] [name] [phone] came to  see pt today and suggested that a TTS consult be made in order to determine capacity of pt. CSW received a call from [name] [name](TTS [facility]) and was informed that pt did not meet criteria for inpt placement at this time because pt is not SI or HI. Per [name] [name] he is going to place a consult for pt to be seen by a psychiatrist in the morning. CSW updated [name] [name] about this and was informed that the pt should be discharged if the pt has been medically cleared at this time. Per doctor pt is not going to be discharged due to evaluation from psychiatrist in the morning.     [name] [name], MSW, LCSW-A Emergency Department Clinical Social Worker [phone]

## 2017-01-14 NOTE — [facility] Counselor (Signed)
Spoke with Dr. Deretha EmoryZackowski.  Put in capacity eval psych consult for Pt.  Pt will be seen on 01/15/17.

## 2017-01-14 NOTE — ED Notes (Signed)
Lunch tray given tech assisted patient with eating.

## 2017-01-14 NOTE — ED Provider Notes (Signed)
Patient with extensive evaluation today by social worker and [facility]ctive Services. They are requesting a formal psychiatric [place] [facility] evaluation. Discussed with TSS and they have arranged for psychiatry to consult on the patient tomorrow. Patient does seems to be making decisions that are inappropriate. Does not seem to have any acute psychotic problem or suicidal problem. And as stated the Adult Protective Services has requested the formal psychiatric consultation.   Vanetta MuldersZackowski, Jeremiah Tarpley, MD 01/14/17 [phone]

## 2017-01-14 NOTE — ED Notes (Signed)
Tia from Adult Protective Services  At bedside.

## 2017-01-14 NOTE — ED Notes (Signed)
Patient wanted to use bedpan , refused BCC. States he couldn't get up.

## 2017-01-14 NOTE — ED Notes (Signed)
Patient status unchanged. Waiting TTS consult

## 2017-01-15 DIAGNOSIS — M25571 Pain in right ankle and joints of right foot: Secondary | ICD-10-CM | POA: Diagnosis not present

## 2017-01-15 DIAGNOSIS — Z598 Other problems related to housing and economic circumstances: Secondary | ICD-10-CM | POA: Diagnosis not present

## 2017-01-15 DIAGNOSIS — Z029 Encounter for administrative examinations, unspecified: Secondary | ICD-10-CM | POA: Diagnosis not present

## 2017-01-15 DIAGNOSIS — F4325 Adjustment disorder with mixed disturbance of emotions and conduct: Secondary | ICD-10-CM | POA: Diagnosis not present

## 2017-01-15 DIAGNOSIS — F1211 Cannabis abuse, in remission: Secondary | ICD-10-CM | POA: Diagnosis not present

## 2017-01-15 DIAGNOSIS — F419 Anxiety disorder, unspecified: Secondary | ICD-10-CM

## 2017-01-15 DIAGNOSIS — Z87891 Personal history of nicotine dependence: Secondary | ICD-10-CM

## 2017-01-15 NOTE — ED Notes (Signed)
Patient eating breakfast at this time. No distress noted.

## 2017-01-15 NOTE — ED Notes (Signed)
Psychiatrist at bedside at this time.  

## 2017-01-15 NOTE — ED Notes (Signed)
Lunch delivered to patient. Set up by tech for patient to eat.

## 2017-01-15 NOTE — Consult Note (Signed)
[facility] Consult   Reason for Consult:  Capacity  Referring Physician:  Dr. Fredia Sorrow Patient Identification: [name] [name] MRN:  [id number] Principal Diagnosis: Adjustment disorder with mixed disturbance of emotions and conduct Diagnosis:   Patient Active Problem List   Diagnosis Date Noted  . Adjustment disorder with mixed disturbance of emotions and conduct [F43.25] 11/17/2016  . Major depressive disorder with psychotic features ([place]) [F32.3] 11/16/2016  . Lumbar back pain [M54.5] 02/04/2016  . AKI (acute kidney injury) ([place]) [N17.9] 01/08/2016  . Closed compression fracture of L3 lumbar vertebra ([place]) [S32.030A] 01/08/2016  . Pruritus [L29.9] 10/01/2015  . Acute maxillary sinusitis [J01.00] 06/03/2014  . Severe asthma [J45.998]   . Acute on chronic respiratory failure ([place]) [J96.20] 05/31/2014  . Chronic cough [R05] 05/31/2014  . Asthma with acute exacerbation [J45.901] 05/31/2014  . Chronic pain syndrome [G89.4] 05/31/2014  . Malnutrition of moderate degree ([place]) [E44.0] 05/30/2014  . Hypokalemia [E87.6] 05/28/2014  . SOB (shortness of breath) [R06.02] 05/28/2014  . Weakness of back [R29.898] 02/03/2014  . Weakness of both legs [R29.898] 02/03/2014  . Gait instability [R26.81] 12/01/2013  . Allergic contact dermatitis [L23.9] 12/24/2011  . 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 Wrist joint infection ([place]) [M00.9] 10/10/2011    Class: Diagnosis of  . Urinary retention [R33.9] 09/16/2011  . 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 TACHYCARDIA [R00.0] 07/16/2010  . BLINDNESS [H54.8] 10/27/2008  . Sinusitis, chronic [J32.9] 08/03/2007  . Allergic rhinitis [J30.9] 08/03/2007  . Asthma [J45.909] 08/03/2007  . Esophageal reflux [K21.9] 08/03/2007  . ECZEMA [L25.9] 08/03/2007  . OSTEOPENIA [M89.9, M94.9] 08/03/2007  . ULCERATIVE COLITIS, HX OF [Z87.19] 08/03/2007    Total Time spent with patient: 1 hour  Subjective:   [name] [name] is a 66 y.o. male patient admitted with unable to care for himself and right ankle pain.  HPI:  [name] [name] is a 66 years old male came to the [facility] emergency department because somebody that he no from this facility drop him in the emergency department because they cannot provide this service as he deserves. Patient reported he was vacated his home because of bedbugs and required evaluation [facility] emergency department about 2 months ago. Patient reported assisted living facility has been in cannot help him because he he lost his vision completely. Reportedly patient has in and out of irritability agitation and frustration and required medication management in the past.   During my evaluation patient is calm, cooperative and pleasant. ER staff knows and technician also reported patient has no reported behavioral or emotional problems while in the hospital but reportedly he had been irritable, angry and agitated while in the facility. Patient has been awake, alert, oriented to his first name, last name and name of the hospital is in. Patient does not remember name of the facility, contact numbers from this facility, reportedly doubting his past spoke which he does not having his position at this time. Patient has fund of knowledge, language which is intact and good immediate memory but poor recall memory and delayed memory. Patient also seems to be struggling to find food in his plate due to being blind. Patient has poor insight, judgment and knowledge about how to find safe, secure place for living and supporting needed. Patient has a limited psychosocial support in the community. He will be referred to the Department of social service for his social support including housing for blind.   Past Psychiatric History: Patient was diagnosed with adjustment  disorder with mixed disturbance of emotions and conduct, does not required inpatient psychiatric hospitalization.  Risk to Self: Is patient  at risk for suicide?: No Risk to Others:   Prior Inpatient Therapy:   Prior Outpatient Therapy:    Past Medical History:  Past Medical History:  Diagnosis Date  . Anxiety   . Arthritis    spine  . Chronic lower back pain   . Contact dermatitis and other eczema, due to unspecified cause   . COPD (chronic obstructive pulmonary disease) ([place])   . Cough   . Depression    patient constantly hitting left side of face "due to pain"  . Enlarged prostate   . Esophageal reflux   . H/O hiatal hernia   . Headache    PMH: migraines  . Hep C w/o coma, chronic ([place]) early 2013  . History of eye prosthesis   . History of kidney stones   . Hypertension    not on medication at this time  . 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 Multiple facial bone fractures ([place])   . Multiple open wounds    "from esczema- scratching"  . Osteopenia   . Pancreatitis   . Pneumonia    in past had several times  . Seasonal allergies   . Ulcerative colitis   . Unspecified asthma(493.90)   . Unspecified sinusitis (chronic)     Past Surgical History:  Procedure Laterality Date  . CARPAL TUNNEL RELEASE Right 06/15/2013   Procedure: RIGHT CARPAL TUNNEL RELEASE;  Surgeon: Schuyler Amor, MD;  Location: [facility];  Service: Orthopedics;  Laterality: Right;  . COLONOSCOPY W/ BIOPSIES AND POLYPECTOMY    . ENUCLEATION Left   . EYE SURGERY     3 Corneal transplant (bilateral eyes).    . FRACTURE SURGERY    . HARDWARE REMOVAL  10/09/2011   Procedure: HARDWARE REMOVAL;  Surgeon: Newt Minion, MD;  Location: [place];  Service: Orthopedics;  Laterality: Right;  Removal Deep Hardware Right Wrist, placement antibiotic beads.  . INGUINAL HERNIA REPAIR Left    age 32  . Litrotripsy    . metacarpal pinning Right    right hand  . NERVE, TENDON AND ARTERY REPAIR Right 12/05/2015   Procedure: RIGHT WRIST, HAND FLEXOR TENDON LENGTHENING;  Surgeon: Charlotte Crumb, MD;  Location: [facility];  Service: Orthopedics;  Laterality: Right;  . NEUROLYSIS OF MEDIAN NERVE Right 12/05/2015   Procedure: RIGHT MEDIAN NERVE NEUROLYSIS ;  Surgeon: Charlotte Crumb, MD;  Location: [facility];  Service: Orthopedics;  Laterality: Right;  . ORIF DISTAL RADIUS FRACTURE  09/12/11   right  . ORIF WRIST FRACTURE Right 06/15/2013   Procedure: RIGHT RADIUS PSTEOTOMY, DISTAL ULNA RESECTION, AND DIGITAL FLEXOR LENGTHENING AS NEEDED;  Surgeon: Schuyler Amor, MD;  Location: [facility];  Service: Orthopedics;  Laterality: Right;  . PATELLA FRACTURE SURGERY     right  . RADIOLOGY WITH ANESTHESIA N/A 03/09/2014   Procedure: MRI - Cervical and Lumbar without Contrast;  Surgeon: Medication Radiologist, MD;  Location: [facility];  Service: Radiology;  Laterality: N/A;  . RADIOLOGY WITH ANESTHESIA N/A 04/03/2015   Procedure: MRI OF BRAIN    (RADIOLOGY WITH ANESTHESIA);  Surgeon: Medication Radiologist, MD;  Location: [facility];  Service: Radiology;  Laterality: N/A;  . TONSILLECTOMY AND ADENOIDECTOMY     Family History:  Family History  Problem Relation Age of Onset  . Allergies Mother  atopic  . Asthma Mother   . Heart failure Mother        CHF  . Other Father   . Anesthesia problems Neg Hx    Family Psychiatric  History:  Social History:  History  Alcohol Use  . Yes    Comment: "scotch once a year"     History  Drug Use  . Types: Marijuana    Comment: 01/25/15-Patient reports "I'm not going to answer that"- Last time used March 2017.    Social History   Social History  . Marital status: Single    Spouse name: N/A  . Number of children: 1  . Years of education: 12+   Occupational History  . does not work   . Disability     Social History Main Topics  . Smoking status: Former Smoker    Packs/day: 1.00    Years: 3.00    Types: Cigarettes, Pipe    Quit date: 05/20/1983  . Smokeless tobacco: Never Used  . Alcohol use Yes     Comment: "scotch once a year"  . Drug use: Yes     Types: Marijuana     Comment: 01/25/15-Patient reports "I'm not going to answer that"- Last time used March 2017.  Marland Kitchen Sexual activity: Not Asked   Other Topics Concern  . None   Social History Narrative   Lives alone   Blind   Patient has 1 child.    Patient is right handed.    Patient has 12+ years of education.    Patient drinks about 3 cups of caffeine daily.            Additional Social History:    Allergies:   Allergies  Allergen Reactions  . Bacitracin-Polymyxin B Other (See Comments)    Pt states that his ophthalmologist told him he was allergic to this.     . Bee Venom Anaphylaxis  . Codeine Anaphylaxis  . Norco [Hydrocodone-Acetaminophen] Other (See Comments)    Reaction:  Headaches   . Penicillins Anaphylaxis and Other (See Comments)    Tolerated numerous cephalosporins in past. Has patient had a PCN reaction causing immediate rash, facial/tongue/throat swelling, SOB or lightheadedness with hypotension: No Has patient had a PCN reaction causing severe rash involving mucus membranes or skin necrosis: No Has patient had a PCN reaction that required hospitalization: No Has patient had a PCN reaction occurring within the last 10 years: No If all of the above answers are "NO", then may proceed with Cephalosporin use.  Marland Kitchen Shellfish-Derived Products Anaphylaxis  . Other Hives and Other (See Comments)    Pt states that he is allergic to dust, pollen, and mold.   . Tape Hives  . Chlorhexidine Itching and Rash  . Sulfate Itching and Rash    Labs:  Results for orders placed or performed during the hospital encounter of 01/13/17 (from the past 48 hour(s))  CBC with Differential     Status: Abnormal   Collection Time: 01/13/17  7:36 PM  Result Value Ref Range   WBC 7.6 4.0 - 10.5 K/uL   RBC 3.79 (L) 4.22 - 5.81 MIL/uL   Hemoglobin 12.0 (L) 13.0 - 17.0 g/dL   HCT 51.6 (L) 27.8 - 53.5 %   MCV 93.7 78.0 - 100.0 fL   MCH 31.7 26.0 - 34.0 pg   MCHC 33.8 30.0 - 36.0 g/dL    RDW 39.9 65.6 - 74.7 %   Platelets 196 150 - 400 K/uL   Neutrophils  Relative % 71 %   Neutro Abs 5.3 1.7 - 7.7 K/uL   Lymphocytes Relative 15 %   Lymphs Abs 1.2 0.7 - 4.0 K/uL   Monocytes Relative 6 %   Monocytes Absolute 0.5 0.1 - 1.0 K/uL   Eosinophils Relative 8 %   Eosinophils Absolute 0.6 0.0 - 0.7 K/uL   Basophils Relative 0 %   Basophils Absolute 0.0 0.0 - 0.1 K/uL  Basic metabolic panel     Status: Abnormal   Collection Time: 01/13/17  7:36 PM  Result Value Ref Range   Sodium 141 135 - 145 mmol/L   Potassium 3.5 3.5 - 5.1 mmol/L   Chloride 109 101 - 111 mmol/L   CO2 23 22 - 32 mmol/L   Glucose, Bld 95 65 - 99 mg/dL   BUN 16 6 - 20 mg/dL   Creatinine, Ser 1.00 0.61 - 1.24 mg/dL   Calcium 8.5 (L) 8.9 - 10.3 mg/dL   GFR calc non Af Amer >60 >60 mL/min   GFR calc Af Amer >60 >60 mL/min    Comment: (NOTE) The eGFR has been calculated using the CKD EPI equation. This calculation has not been validated in all clinical situations. eGFR's persistently <60 mL/min signify possible Chronic Kidney Disease.    Anion gap 9 5 - 15  Urinalysis, Routine w reflex microscopic     Status: None   Collection Time: 01/13/17  9:02 PM  Result Value Ref Range   Color, Urine YELLOW YELLOW   APPearance CLEAR CLEAR   Specific Gravity, Urine 1.025 1.005 - 1.030   pH 5.0 5.0 - 8.0   Glucose, UA NEGATIVE NEGATIVE mg/dL   Hgb urine dipstick NEGATIVE NEGATIVE   Bilirubin Urine NEGATIVE NEGATIVE   Ketones, ur NEGATIVE NEGATIVE mg/dL   Protein, ur NEGATIVE NEGATIVE mg/dL   Nitrite NEGATIVE NEGATIVE   Leukocytes, UA NEGATIVE NEGATIVE  Rapid urine drug screen (hospital performed)     Status: Abnormal   Collection Time: 01/13/17  9:02 PM  Result Value Ref Range   Opiates NONE DETECTED NONE DETECTED   Cocaine NONE DETECTED NONE DETECTED   Benzodiazepines POSITIVE (A) NONE DETECTED   Amphetamines NONE DETECTED NONE DETECTED   Tetrahydrocannabinol NONE DETECTED NONE DETECTED   Barbiturates NONE  DETECTED NONE DETECTED    Comment:        DRUG SCREEN FOR MEDICAL PURPOSES ONLY.  IF CONFIRMATION IS NEEDED FOR ANY PURPOSE, NOTIFY LAB WITHIN 5 DAYS.        LOWEST DETECTABLE LIMITS FOR URINE DRUG SCREEN Drug Class       Cutoff (ng/mL) Amphetamine      1000 Barbiturate      200 Benzodiazepine   562 Tricyclics       130 Opiates          300 Cocaine          300 THC              50     Current Facility-Administered Medications  Medication Dose Route Frequency Provider Last Rate Last Dose  . albuterol (PROVENTIL HFA;VENTOLIN HFA) 108 (90 Base) MCG/ACT inhaler 2 puff  2 puff Inhalation Q6H PRN Milton Ferguson, MD      . albuterol (PROVENTIL) (2.5 MG/3ML) 0.083% nebulizer solution 2.5 mg  2.5 mg Nebulization Q4H PRN Milton Ferguson, MD      . bisacodyl (DULCOLAX) suppository 10 mg  10 mg Rectal PRN Milton Ferguson, MD      . diazepam (VALIUM) tablet 10  mg  10 mg Oral TID Bethann Berkshire, MD   10 mg at 01/15/17 1045  . fluticasone (FLONASE) 50 MCG/ACT nasal spray 2 spray  2 spray Each Nare BID Bethann Berkshire, MD      . gabapentin (NEURONTIN) capsule 200 mg  200 mg Oral BID Bethann Berkshire, MD   200 mg at 01/15/17 1046  . hydrOXYzine (ATARAX/VISTARIL) tablet 25 mg  25 mg Oral Q6H PRN Bethann Berkshire, MD      . predniSONE (DELTASONE) tablet 40 mg  40 mg Oral Daily Bethann Berkshire, MD   40 mg at 01/15/17 1045  . sertraline (ZOLOFT) tablet 25 mg  25 mg Oral Daily Bethann Berkshire, MD   25 mg at 01/15/17 1046  . tamsulosin (FLOMAX) capsule 0.4 mg  0.4 mg Oral Daily Bethann Berkshire, MD   0.4 mg at 01/15/17 1046   Current Outpatient Prescriptions  Medication Sig Dispense Refill  . albuterol (PROVENTIL HFA;VENTOLIN HFA) 108 (90 Base) MCG/ACT inhaler Inhale 2 puffs into the lungs every 6 (six) hours as needed for wheezing or shortness of breath. 1 Inhaler 0  . fluticasone (FLONASE) 50 MCG/ACT nasal spray USE 2 SPRAYS IN EACH NOSTRIL TWICE DAILY. 16 g 3  . gabapentin (NEURONTIN) 100 MG capsule Take 2  capsules (200 mg total) by mouth 2 (two) times daily. 60 capsule 0  . hydrOXYzine (ATARAX/VISTARIL) 25 MG tablet Take 1 tablet (25 mg total) by mouth every 6 (six) hours as needed for itching. 30 tablet 0  . sertraline (ZOLOFT) 25 MG tablet Take 1 tablet (25 mg total) by mouth daily. 30 tablet 0  . tamsulosin (FLOMAX) 0.4 MG CAPS capsule Take 1 capsule (0.4 mg total) by mouth daily. 30 capsule 0  . albuterol (PROVENTIL) (2.5 MG/3ML) 0.083% nebulizer solution Take 3 mLs (2.5 mg total) by nebulization every 4 (four) hours as needed for wheezing or shortness of breath. (Patient not taking: Reported on 01/13/2017) 360 mL 1  . bisacodyl (DULCOLAX) 10 MG suppository Place 1 suppository (10 mg total) rectally as needed for moderate constipation. (Patient not taking: Reported on 11/29/2016) 12 suppository 0  . diazepam (VALIUM) 10 MG tablet Take 1 tablet (10 mg total) by mouth 3 (three) times daily. (Patient not taking: Reported on 11/29/2016) 10 tablet 0  . predniSONE (DELTASONE) 20 MG tablet Take 2 tablets (40 mg total) by mouth daily. (Patient not taking: Reported on 01/05/2017) 10 tablet 0    Musculoskeletal: Strength & Muscle Tone: decreased Gait & Station: unable to stand Patient leans: N/A  Psychiatric Specialty Exam: Physical Exam Full physical performed in Emergency Department. I have reviewed this assessment and concur with its findings.   ROS patient is blind, considered homeless and has limited psychosocial support No Fever-chills, No Headache, No changes with Vision or hearing, reports vertigo No problems swallowing food or Liquids, No Chest pain, Cough or Shortness of Breath, No Abdominal pain, No Nausea or Vommitting, Bowel movements are regular, No Blood in stool or Urine, No dysuria, No new skin rashes or bruises, No new joints pains-aches,  No new weakness, tingling, numbness in any extremity, No recent weight gain or loss, No polyuria, polydypsia or polyphagia,  A full 10 point  Review of Systems was done, except as stated above, all other Review of Systems were negative.  Blood pressure (!) 141/89, pulse 76, temperature (!) 97.5 F (36.4 C), temperature source Oral, resp. rate 18, SpO2 95 %.There is no height or weight on file to calculate BMI.  General Appearance:  Guarded  Eye Contact:  blind bilaterally.  Speech:  Clear and Coherent  Volume:  Decreased  Mood:  Euthymic  Affect:  Constricted and Depressed  Thought Process:  Coherent and Goal Directed  Orientation:  Full (Time, Place, and Person)  Thought Content:  to his name and name of the hospital only.  Suicidal Thoughts:  No  Homicidal Thoughts:  No  Memory:  Immediate;   Good Recent;   Poor Remote;   Fair  Judgement:  Impaired  Insight:  Shallow  Psychomotor Activity:  Decreased  Concentration:  Concentration: Fair and Attention Span: Fair  Recall:  Poor  Fund of Knowledge:  Fair  Language:  Good  Akathisia:  Negative  Handed:  Right  AIMS (if indicated):     Assets:  Communication Skills Leisure Time  ADL's:  Impaired  Cognition:  Impaired,  Moderate  Sleep:        Treatment Plan Summary: 66 years old male, blindness, homeless, reportedly dropped by his staff members from the assisted living facility. Patient only medical complaint is ankle sprain. Patient denies depression, anxiety and psychosis. Patient is known to have irritability and frustration.  Based on my evaluation patient does not meet criteria for capacity to make his own medical decisions or living arrangements.  Patient needed extremely high support and care. Patient benefit from receiving facility for blind and total care.   Will ask units CSW, referred him to the Department of Social Services for guardianship and housing requirements.   Disposition: No evidence of imminent risk to self or others at present.   Supportive therapy provided about ongoing stressors.  Ambrose Finland, MD 01/15/2017 11:01 AM

## 2017-01-16 ENCOUNTER — Encounter (HOSPITAL_COMMUNITY): Payer: Self-pay | Admitting: *Deleted

## 2017-01-16 ENCOUNTER — Emergency Department (HOSPITAL_COMMUNITY)
Admission: EM | Admit: 2017-01-16 | Discharge: 2017-02-04 | Disposition: A | Discharge status: Other Acute Inpatient Hospital | Payer: Medicare Other | Attending: Emergency Medicine | Admitting: Emergency Medicine

## 2017-01-16 DIAGNOSIS — Z885 Allergy status to narcotic agent status: Secondary | ICD-10-CM | POA: Diagnosis not present

## 2017-01-16 DIAGNOSIS — W19XXXA Unspecified fall, initial encounter: Secondary | ICD-10-CM | POA: Diagnosis not present

## 2017-01-16 DIAGNOSIS — Z87891 Personal history of nicotine dependence: Secondary | ICD-10-CM | POA: Insufficient documentation

## 2017-01-16 DIAGNOSIS — I10 Essential (primary) hypertension: Secondary | ICD-10-CM | POA: Insufficient documentation

## 2017-01-16 DIAGNOSIS — Z880 Allergy status to penicillin: Secondary | ICD-10-CM | POA: Diagnosis not present

## 2017-01-16 DIAGNOSIS — J449 Chronic obstructive pulmonary disease, unspecified: Secondary | ICD-10-CM | POA: Diagnosis not present

## 2017-01-16 DIAGNOSIS — Z91030 Bee allergy status: Secondary | ICD-10-CM | POA: Diagnosis not present

## 2017-01-16 DIAGNOSIS — Z91013 Allergy to seafood: Secondary | ICD-10-CM | POA: Diagnosis not present

## 2017-01-16 DIAGNOSIS — Z5900 Homelessness unspecified: Secondary | ICD-10-CM

## 2017-01-16 DIAGNOSIS — Z79899 Other long term (current) drug therapy: Secondary | ICD-10-CM | POA: Insufficient documentation

## 2017-01-16 MED ORDER — FLUTICASONE PROPIONATE 50 MCG/ACT NA SUSP
2.0000 | Freq: Two times a day (BID) | NASAL | Status: DC
  Administered 2017-01-16 – 2017-02-03 (×24): 2 via NASAL
  Filled 2017-01-16 (×5): qty 16

## 2017-01-16 MED ORDER — TAMSULOSIN HCL 0.4 MG PO CAPS
0.4000 mg | ORAL_CAPSULE | Freq: Every day | ORAL | Status: DC
  Administered 2017-01-16 – 2017-02-03 (×18): 0.4 mg via ORAL
  Filled 2017-01-16 (×19): qty 1

## 2017-01-16 MED ORDER — ALBUTEROL SULFATE HFA 108 (90 BASE) MCG/ACT IN AERS
2.0000 | INHALATION_SPRAY | Freq: Four times a day (QID) | RESPIRATORY_TRACT | Status: DC | PRN
Start: 2017-01-16 — End: 2017-02-04
  Administered 2017-01-19 – 2017-01-27 (×2): 2 via RESPIRATORY_TRACT
  Filled 2017-01-16 (×3): qty 6.7

## 2017-01-16 MED ORDER — GABAPENTIN 100 MG PO CAPS
200.0000 mg | ORAL_CAPSULE | Freq: Two times a day (BID) | ORAL | Status: DC
  Administered 2017-01-16 – 2017-02-03 (×35): 200 mg via ORAL
  Filled 2017-01-16 (×37): qty 2

## 2017-01-16 MED ORDER — SERTRALINE HCL 50 MG PO TABS
25.0000 mg | ORAL_TABLET | Freq: Every day | ORAL | Status: DC
  Administered 2017-01-16 – 2017-02-03 (×18): 25 mg via ORAL
  Filled 2017-01-16 (×21): qty 1

## 2017-01-16 MED ORDER — HYDROXYZINE HCL 25 MG PO TABS
25.0000 mg | ORAL_TABLET | Freq: Four times a day (QID) | ORAL | Status: DC | PRN
  Administered 2017-01-21 – 2017-02-01 (×7): 25 mg via ORAL
  Filled 2017-01-16 (×9): qty 1

## 2017-01-16 NOTE — ED Provider Notes (Addendum)
[facility]-EMERGENCY DEPT Provider Note   CSN: [id number] Arrival date & time: 01/16/17  1343     History   Chief Complaint Chief Complaint  Patient presents with  . Headache    HPI [name] [name] is a 66 y.o. male.  HPI Patient presents after reported fall. 14 visits to ER in the last 6 months and discharged yesterday. Psychiatry said he does not have capacity apparently guardianship is being obtained. However he was discharged yesterday with a bus pass. Planning of only pain on his rear end. Denies headache now but had reported headache earlier. Have discussed with social work, Scientist, water quality. Stated patient will need to stay in the ER until Dr. Kinnie Scales services and placed on Tuesday. Past Medical History:  Diagnosis Date  . Anxiety   . Arthritis    spine  . Chronic lower back pain   . Contact dermatitis and other eczema, due to unspecified cause   . COPD (chronic obstructive pulmonary disease) (HCC)   . Cough   . Depression    patient constantly hitting left side of face "due to pain"  . Enlarged prostate   . Esophageal reflux   . H/O hiatal hernia   . Headache    PMH: migraines  . Hep C w/o coma, chronic (HCC) early 2013  . History of eye prosthesis   . History of kidney stones   . Hypertension    not on medication at this time  . 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 Multiple facial bone fractures (HCC)   . Multiple open wounds    "from esczema- scratching"  . Osteopenia   . Pancreatitis   . Pneumonia    in past had several times  . Seasonal allergies   . Ulcerative colitis   . Unspecified asthma(493.90)   . Unspecified sinusitis (chronic)     Patient Active Problem List   Diagnosis Date Noted  . 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 Major depressive disorder with psychotic features (HCC) 11/16/2016  . Lumbar back pain 02/04/2016  . AKI (acute kidney injury) (HCC) 01/08/2016  . Closed compression  fracture of L3 lumbar vertebra (HCC) 01/08/2016  . Pruritus 10/01/2015  . Acute maxillary sinusitis 06/03/2014  . Severe asthma   . Acute on chronic respiratory failure (HCC) 05/31/2014  . Chronic cough 05/31/2014  . Asthma with acute exacerbation 05/31/2014  . Chronic pain syndrome 05/31/2014  . Malnutrition of moderate degree (HCC) 05/30/2014  . Hypokalemia 05/28/2014  . SOB (shortness of breath) 05/28/2014  . Weakness of back 02/03/2014  . Weakness of both legs 02/03/2014  . Gait instability 12/01/2013  . Allergic contact dermatitis 12/24/2011  . 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 Wrist joint infection (HCC) 10/10/2011    Class: Diagnosis of  . Urinary retention 09/16/2011  . 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 TACHYCARDIA 07/16/2010  . BLINDNESS 10/27/2008  . Sinusitis, chronic 08/03/2007  . Allergic rhinitis 08/03/2007  . Asthma 08/03/2007  . Esophageal reflux 08/03/2007  . ECZEMA 08/03/2007  . OSTEOPENIA 08/03/2007  . ULCERATIVE COLITIS, HX OF 08/03/2007    Past Surgical History:  Procedure Laterality Date  . CARPAL TUNNEL RELEASE Right 06/15/2013   Procedure: RIGHT CARPAL TUNNEL RELEASE;  Surgeon: Marlowe Shores, MD;  Location: [facility];  Service: Orthopedics;  Laterality: Right;  . COLONOSCOPY W/ BIOPSIES AND POLYPECTOMY    . ENUCLEATION Left   . EYE SURGERY  3 Corneal transplant (bilateral eyes).    . FRACTURE SURGERY    . HARDWARE REMOVAL  10/09/2011   Procedure: HARDWARE REMOVAL;  Surgeon: Nadara Mustard, MD;  Location: [facility] OR;  Service: Orthopedics;  Laterality: Right;  Removal Deep Hardware Right Wrist, placement antibiotic beads.  . INGUINAL HERNIA REPAIR Left    age 58  . Litrotripsy    . metacarpal pinning Right    right hand  . NERVE, TENDON AND ARTERY REPAIR Right 12/05/2015   Procedure: RIGHT WRIST, HAND FLEXOR TENDON LENGTHENING;  Surgeon: Dairl Ponder, MD;  Location: [facility] OR;  Service: Orthopedics;  Laterality: Right;  .  NEUROLYSIS OF MEDIAN NERVE Right 12/05/2015   Procedure: RIGHT MEDIAN NERVE NEUROLYSIS ;  Surgeon: Dairl Ponder, MD;  Location: [facility] OR;  Service: Orthopedics;  Laterality: Right;  . ORIF DISTAL RADIUS FRACTURE  09/12/11   right  . ORIF WRIST FRACTURE Right 06/15/2013   Procedure: RIGHT RADIUS PSTEOTOMY, DISTAL ULNA RESECTION, AND DIGITAL FLEXOR LENGTHENING AS NEEDED;  Surgeon: Marlowe Shores, MD;  Location: [facility];  Service: Orthopedics;  Laterality: Right;  . PATELLA FRACTURE SURGERY     right  . RADIOLOGY WITH ANESTHESIA N/A 03/09/2014   Procedure: MRI - Cervical and Lumbar without Contrast;  Surgeon: Medication Radiologist, MD;  Location: [facility] OR;  Service: Radiology;  Laterality: N/A;  . RADIOLOGY WITH ANESTHESIA N/A 04/03/2015   Procedure: MRI OF BRAIN    (RADIOLOGY WITH ANESTHESIA);  Surgeon: Medication Radiologist, MD;  Location: [facility] OR;  Service: Radiology;  Laterality: N/A;  . TONSILLECTOMY AND ADENOIDECTOMY         Home Medications    Prior to Admission medications   Medication Sig Start Date End Date Taking? Authorizing Provider  albuterol (PROVENTIL HFA;VENTOLIN HFA) 108 (90 Base) MCG/ACT inhaler Inhale 2 puffs into the lungs every 6 (six) hours as needed for wheezing or shortness of breath. 08/13/16   Mannam, Praveen, MD  albuterol (PROVENTIL) (2.5 MG/3ML) 0.083% nebulizer solution Take 3 mLs (2.5 mg total) by nebulization every 4 (four) hours as needed for wheezing or shortness of breath. Patient not taking: Reported on 01/13/2017 04/17/15   Parrett, Virgel Bouquet, NP  bisacodyl (DULCOLAX) 10 MG suppository Place 1 suppository (10 mg total) rectally as needed for moderate constipation. Patient not taking: Reported on 11/29/2016 01/11/16   Calvert Cantor, MD  diazepam (VALIUM) 10 MG tablet Take 1 tablet (10 mg total) by mouth 3 (three) times daily. Patient not taking: Reported on 11/29/2016 06/06/14   Osvaldo Shipper, MD  fluticasone [facility]) 50 MCG/ACT nasal spray  USE 2 SPRAYS IN EACH NOSTRIL TWICE DAILY. 05/08/16   Mannam, Colbert Coyer, MD  gabapentin (NEURONTIN) 100 MG capsule Take 2 capsules (200 mg total) by mouth 2 (two) times daily. 11/17/16   Charm Rings, NP  hydrOXYzine (ATARAX/VISTARIL) 25 MG tablet Take 1 tablet (25 mg total) by mouth every 6 (six) hours as needed for itching. 11/17/16   Charm Rings, NP  predniSONE (DELTASONE) 20 MG tablet Take 2 tablets (40 mg total) by mouth daily. Patient not taking: Reported on 01/05/2017 11/25/16   Benjiman Core, MD  sertraline (ZOLOFT) 25 MG tablet Take 1 tablet (25 mg total) by mouth daily. 11/18/16   Charm Rings, NP  tamsulosin (FLOMAX) 0.4 MG CAPS capsule Take 1 capsule (0.4 mg total) by mouth daily. 01/05/17   Palumbo, April, MD    Family History Family History  Problem Relation Age of Onset  . Allergies Mother  atopic  . Asthma Mother   . Heart failure Mother        CHF  . Other Father   . Anesthesia problems Neg Hx     Social History Social History  Substance Use Topics  . Smoking status: Former Smoker    Packs/day: 1.00    Years: 3.00    Types: Cigarettes, Pipe    Quit date: 05/20/1983  . Smokeless tobacco: Never Used  . Alcohol use Yes     Comment: "scotch once a year"     Allergies   Bacitracin-polymyxin b; Bee venom; Codeine; Norco [hydrocodone-acetaminophen]; Penicillins; Shellfish-derived products; Other; Tape; Chlorhexidine; and Sulfate   Review of Systems Review of Systems  Constitutional: Negative for appetite change.  HENT: Negative for congestion.   Eyes:       Patient is blind  Respiratory: Negative for shortness of breath.   Cardiovascular: Negative for chest pain.  Gastrointestinal: Negative for abdominal pain.  Genitourinary: Negative for flank pain.  Musculoskeletal: Negative for back pain.  Neurological: Negative for numbness.  Psychiatric/Behavioral: Negative for confusion.     Physical Exam Updated Vital Signs BP (!) 159/99 (BP Location:  Left Arm)   Pulse 78   Temp 98 F (36.7 C) (Oral)   Resp 18   SpO2 96%   Physical Exam  Constitutional: He appears well-developed.  HENT:  Head: Atraumatic.  Eyes:  Patient is blind with cloudy cornea on left side and sunken eye on right side  Neck: Neck supple.  Cardiovascular: Normal rate.   Pulmonary/Chest: Effort normal.  Abdominal: Soft.  Musculoskeletal: He exhibits no edema.  Neurological: He is alert.  Skin: Skin is warm. Capillary refill takes less than 2 seconds.     ED Treatments / Results  Labs (all labs ordered are listed, but only abnormal results are displayed) Labs Reviewed - No data to display  EKG  EKG Interpretation None       Radiology No results found.  Procedures Procedures (including critical care time)  Medications Ordered in ED Medications  albuterol (PROVENTIL HFA;VENTOLIN HFA) 108 (90 Base) MCG/ACT inhaler 2 puff (not administered)  fluticasone (FLONASE) 50 MCG/ACT nasal spray 2 spray (not administered)  gabapentin (NEURONTIN) capsule 200 mg (200 mg Oral Given 01/16/17 1650)  hydrOXYzine (ATARAX/VISTARIL) tablet 25 mg (not administered)  sertraline (ZOLOFT) tablet 25 mg (not administered)  tamsulosin (FLOMAX) capsule 0.4 mg (not administered)     Initial Impression / Assessment and Plan / ED Course  I have reviewed the triage vital signs and the nursing notes.  Pertinent labs & imaging results that were available during my care of the patient were reviewed by me and considered in my medical decision making (see chart for details).     Patient with reported fall. Discharged yesterday. Reportedly got left hospital. No apparent injury from the fall however patient was deemed likely incompetent guardianship to be obtained yesterday. Discussed with social work and states that the patient will have to stay in the ER until Tuesday when APS can place him.  Final Clinical Impressions(s) / ED Diagnoses   Final diagnoses:  Fall, initial  encounter  Homeless    New Prescriptions New Prescriptions   No medications on file     Benjiman Core, MD 01/16/17 1514  Discussed with Shanda Bumps   from social work. States she talked with her director who said that the patient should be admitted as an observation for placement in the patient does not need to stay in the ER for  that.   Benjiman CorePickering, Kalika Smay, MD 01/16/17 1625   Discussed with Dr. Cena BentonVega and he discussed with his medical director Dr.Feliz-Ortiz who said there is no clear medical need for admission. Patient can stay in the ER for placement.   Benjiman CorePickering, Rease Wence, MD 01/16/17 [phone]

## 2017-01-16 NOTE — Clinical Social Work Note (Signed)
Clinical Social Worker received notification that patient no longer has a bed available at [facility] and APS does not yet have an order for guardianship.  CSW contacted RNCM who states she will notify, MD to discharge patient and will update ED Director about plan of care moving forward.  CSW contacted Director of Social Work, who states that patient will need to be discharged and if he continues to refuse to leave law enforcement will need to get involved.  CSW contacted security leadership and left a message.  CSW remains available for support.  [name] [name], [place] [phone]
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
## 2017-01-16 NOTE — Progress Notes (Signed)
CSW was updated this morning by evening CSW. Evening CSW informed daytime CSW that pt had been discharged and had a bus pass to get pt to the next destination. Evening CSW informed daytime CSW that she had spoke with Dr. Mervyn SkeetersA (Medical Director) and was informed that pt was to be discharged 01/15/17. Evening CSW informed daytime CSW that pt was in the lobby and had been discharged. Daytime CSW spoke with pt in the lobby to confirm that pt still had bus pass and pt did. CSW informed staff that pt had a bus pass and there were no other current CSW needs at this time. Staff agreed to assist pt to the bus stop as needed.    [name] [name], MSW, LCSW-A Emergency Department Clinical Social Worker [phone]

## 2017-01-16 NOTE — ED Notes (Signed)
Pt urinated on self. Pt cleaned up; medium condom cath applied.

## 2017-01-16 NOTE — Progress Notes (Signed)
Please see detailed note in chart from earlier today.  Discussed at length again with APS regarding placement considerations Patient [facility] for the following  :  Flight risk, hx of substance abuse and on Viagra.  Admissions reports he is not a good fit in our community.  APS reports they have no other placements and LCSW provided options as APS is currently working on guardianship and placement.  LCSW has updated co-workers as well as Psychologist, occupationalcalled Director of Social Worker regarding barriers.    [name] EmoryHannah Jerell Demery LCSW, MSW Clinical Social Work: Optician, dispensingystem Wide Float Coverage for :  [phone]

## 2017-01-16 NOTE — ED Notes (Signed)
Pt states he received lunch prior to being [facility]
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
## 2017-01-16 NOTE — Clinical Social Work Note (Signed)
Clinical Social Worker continuing to follow patient.  CSW spoke with CSW Director again and it has been determined that patient does not have a safe discharge plan and will admit for placement.  CSW updated, [facility], who states that APS will petition for guardianship on Tuesday 01/20/2017 and has initiated search to locked units in the area.  CSW to submit for [place].  CSW remains available for support as needed.  [name] [name], [place]CS[place] [phone]

## 2017-01-16 NOTE — ED Notes (Signed)
Dinner tray provided
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
## 2017-01-17 ENCOUNTER — Telehealth: Payer: Self-pay

## 2017-01-17 DIAGNOSIS — Z590 Homelessness: Secondary | ICD-10-CM | POA: Diagnosis not present

## 2017-01-17 NOTE — ED Notes (Signed)
Lunch order request received from pt. Pt states he does not have any teeth so needs soft foods.

## 2017-01-17 NOTE — ED Provider Notes (Signed)
Patient awaiting social work placement. Vital signs stable. Home meds ordered. Ate breakfast per nursing. Sleeping on my evaluation. No acute changes.   [name] [name], [name] Gutman, MD 01/19/17 1015

## 2017-01-17 NOTE — ED Notes (Signed)
Pt given apple juice as requested.

## 2017-01-17 NOTE — ED Notes (Signed)
Pt given apple juice and graham crackers for a snack.

## 2017-01-17 NOTE — ED Notes (Signed)
Pt advised RN he had a BM and needs to be changed. RN cleansed pt. States he is unable to ambulate d/t "sprained ankle" - states he sat in w/c too long and is not able to take full step sometimes. States he does not want to go into a nursing facility d/t "they will take my check". Asked pt if he is able to care for himself - states yes. Asked pt to advise RN next time he feels he needs to have a BM and RN will assist him to bathroom. Pt then states, "Well, I can't walk". RN advised will use w/c.

## 2017-01-17 NOTE — ED Notes (Signed)
Pt cleaned up and put on a new condom cath

## 2017-01-17 NOTE — ED Notes (Addendum)
Pt arrived to F8 via bed. Pt alert, oriented, cooperative. Pt noted w/no gown or clothing on - states he prefers to be this way. Ordered pt toast and jelly as requested. Condom cath noted to be intact to pt - draining yellow urine. Pt's belongings - 1 large bag, shoes, and walking stick placed on counter in room - pt aware. Pt placed his glass eye in his left eye. TV turned on for pt. Pt states he is comfortable at this time. No further assistance needed at this time.

## 2017-01-17 NOTE — ED Notes (Signed)
Pt voiced understanding of tx plan - SW seeking placement in SNF and he may be accepted and transported on 01/20/17. Pt noted to be blind - being fed breakfast by NT. Pt's belongings - 1 large bag, shoes, and walking cane on counter in room. Monitor intact to pt. No distress noted from pt.

## 2017-01-17 NOTE — ED Notes (Signed)
Dinner tray set up for pt so may feed himself.

## 2017-01-18 DIAGNOSIS — Z590 Homelessness: Secondary | ICD-10-CM | POA: Diagnosis not present

## 2017-01-18 NOTE — ED Notes (Signed)
Pt observed to be yelling at NT when asked if he wanted to move back to the bed. Previous RN noted pt as somewhat more agitated today, but had not been yelling at staff until this sudden outburst.

## 2017-01-18 NOTE — ED Notes (Signed)
Pt ambulated from recliner to bed by self w/o difficulty - used urinal and noted to be incont of stool. Pt denies BM - asking "Where did that come from?" Pt cleansed by staff.

## 2017-01-18 NOTE — ED Notes (Signed)
Asked pt if he would like assistance w/moving back to bed. Advised he would rather eat - asking for peanut butter w/graham crackers and Sprite. Pt aware NT obtaining for him.

## 2017-01-18 NOTE — ED Notes (Signed)
Condom cath placed on pt d/t pt incont of urine.

## 2017-01-18 NOTE — ED Notes (Addendum)
Sitting in recliner listening to the tv. Pt has urinal and condom cath was removed.

## 2017-01-18 NOTE — ED Notes (Signed)
Pt given more graham crackers with peanut butter on them

## 2017-01-18 NOTE — ED Notes (Addendum)
Breakfast tray set up for pt d/t blind. Pt feeding self. Condom cath noted to be intact to pt - draining yellow urine.

## 2017-01-18 NOTE — ED Notes (Signed)
NT offered to assist pt w/moving back to bed. Pt yelled at NT x 2 - "Who died and made you boss!" "Hell no!"

## 2017-01-18 NOTE — ED Notes (Signed)
Pt given graham crackers with peanut butter on them and sprite for snack

## 2017-01-19 DIAGNOSIS — Z590 Homelessness: Secondary | ICD-10-CM | POA: Diagnosis not present

## 2017-01-19 MED ORDER — MIDAZOLAM HCL 2 MG/2ML IJ SOLN
3.0000 mg | Freq: Once | INTRAMUSCULAR | Status: AC
  Administered 2017-01-19: 3 mg via INTRAMUSCULAR
  Filled 2017-01-19: qty 4

## 2017-01-19 NOTE — ED Notes (Signed)
Patient mattress was placed back onto bed after patient became carmer, Patient sitting in chair in room eating his dinner.
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
## 2017-01-19 NOTE — ED Notes (Signed)
Pt states condom catheter fell off. This RN and tech replaced condom catheter. Pt expresses no more concerns at this time.

## 2017-01-19 NOTE — ED Provider Notes (Signed)
Resting comfortably in bed. Alert.Denies complaint.  stable   Doug SouJacubowitz, Khaled Herda, MD 01/19/17 [phone]

## 2017-01-19 NOTE — ED Notes (Signed)
Regular Diet was ordered for Dinner. 
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
## 2017-01-19 NOTE — ED Notes (Signed)
Patient was given ginger ale.

## 2017-01-19 NOTE — ED Notes (Signed)
Patient given snack. Graham crackers w/ peanut butter and apple juice.

## 2017-01-19 NOTE — ED Triage Notes (Signed)
Snack given to PT.
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
## 2017-01-19 NOTE — ED Notes (Signed)
Regular breakfast tray ordered.  

## 2017-01-19 NOTE — ED Notes (Signed)
Lunch was ordered for patient. 
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
## 2017-01-19 NOTE — ED Triage Notes (Signed)
Pt sitting up in  Recliner.

## 2017-01-20 DIAGNOSIS — Z590 Homelessness: Secondary | ICD-10-CM | POA: Diagnosis not present

## 2017-01-20 MED ORDER — ONDANSETRON HCL 4 MG PO TABS: 4.0000 mg | ORAL_TABLET | Freq: Three times a day (TID) | ORAL | Status: DC | PRN

## 2017-01-20 MED ORDER — IBUPROFEN 400 MG PO TABS
600.0000 mg | ORAL_TABLET | Freq: Three times a day (TID) | ORAL | Status: DC | PRN
  Administered 2017-01-28: 600 mg via ORAL
  Filled 2017-01-20: qty 1

## 2017-01-20 MED ORDER — ALUM & MAG HYDROXIDE-SIMETH 200-200-20 MG/5ML PO SUSP
30.0000 mL | Freq: Four times a day (QID) | ORAL | Status: DC | PRN
  Administered 2017-01-20: 30 mL via ORAL
  Filled 2017-01-20: qty 30

## 2017-01-20 NOTE — ED Notes (Signed)
Pt called out stating he is wet d/t incont urine. Pt had removed his condom cath. Pt also noted to be incont stool.

## 2017-01-20 NOTE — ED Notes (Signed)
Pt eating breakfast. Pt asking when he is going to be released. Advised pt someone will be coming in to speak w/him today. States "If I'm released, I have no where to go." Advised pt we are working on that.

## 2017-01-20 NOTE — ED Notes (Signed)
Pt eating lunch - tray set up for pt.

## 2017-01-20 NOTE — ED Notes (Signed)
Graham crackers w/peanut butter given to pt w/Ginger Ale as requested.

## 2017-01-20 NOTE — ED Notes (Signed)
Regular Diet was ordered for Lunch. 

## 2017-01-20 NOTE — Progress Notes (Addendum)
LCSW following for disposition   Patient is being worked up for placement and guardianship by APS and collaborating with LCSW. [name] [name] is the interim guardian and submitting paperwork. CSW is currently working patient up for placement and has consulted with leadership who are in agreement.  LCSW to update medical team and APS as SNF work up is completed and placement is completed/bed offers.   SNF work up completed FL2 completed [facility] is pending (will require manual review for long term care) RN Kriste BasqueBecky called by this Clinical research associatewriter and made aware of current plans and barriers. APS called and has filed for guardianship as of today.    Plan:  Pending placement and bed offers/passar.   [name] EmoryHannah China Deitrick LCSW, MSW Clinical Social Work: Optician, dispensingystem Wide Float Coverage for :  [phone]

## 2017-01-20 NOTE — ED Notes (Signed)
Patient was given a Ginger Ale, and A Regular Diet was Taken.

## 2017-01-20 NOTE — ED Notes (Signed)
RN cleansed pt of incont urine x 2 - condom cath will not remain intact to pt. Urinal given - pt used w/o difficulty. Pt states he continuously soils himself and has been to urologist who advised nothing is wrong w/him. Pt return demonstrated to RN how he can remove brief and use urinal. Pt noted to be labile - was irritated earlier stating he wanted to leave. Stated he does not want to go to a nursing home. RN spoke w/pt about needing assistance w/caring for himself.

## 2017-01-20 NOTE — ED Notes (Signed)
Regular Diet ordered for Dinner. 

## 2017-01-20 NOTE — ED Provider Notes (Signed)
Resting comfortably in bed. Alert. Denies complaint   [name] Viscomi, MD 01/20/17 0914  

## 2017-01-20 NOTE — [place] FL2 (Signed)
[place] MEDICA[place] FL2 LEVEL OF CARE SCREENING TOOL     IDENTIFICATION  Patient Name: [name] [name] Birthdate: Feb 15, 1951 Sex: male Admission Date (Current Location): 01/16/2017  [place] Number:  [place] [id number] S Facility and Address:  [facility], [address], [place], [place] [address]      Provider Number: [id number]  Attending Physician Name and Address:  No att. providers found  Relative Name and Phone Number:  Work up for guardianship  Nelida Meuse [facility] APS  [phone]     Current Level of Care: Hospital Recommended Level of Care: Skilled Nursing Facility Prior Approval Number:    Date Approved/Denied:   PASRR Number:    Discharge Plan: SNF    Current Diagnoses: Patient Active Problem List   Diagnosis Date Noted  . 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 Major depressive disorder with psychotic features (HCC) 11/16/2016  . Lumbar back pain 02/04/2016  . AKI (acute kidney injury) (HCC) 01/08/2016  . Closed compression fracture of L3 lumbar vertebra (HCC) 01/08/2016  . Pruritus 10/01/2015  . Acute maxillary sinusitis 06/03/2014  . Severe asthma   . Acute on chronic respiratory failure (HCC) 05/31/2014  . Chronic cough 05/31/2014  . Asthma with acute exacerbation 05/31/2014  . Chronic pain syndrome 05/31/2014  . Malnutrition of moderate degree (HCC) 05/30/2014  . Hypokalemia 05/28/2014  . SOB (shortness of breath) 05/28/2014  . Weakness of back 02/03/2014  . Weakness of both legs 02/03/2014  . Gait instability 12/01/2013  . Allergic contact dermatitis 12/24/2011  . 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 Wrist joint infection (HCC) 10/10/2011    Class: Diagnosis of  . Urinary retention 09/16/2011  . 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 TACHYCARDIA 07/16/2010  . BLINDNESS 10/27/2008  . Sinusitis, chronic 08/03/2007  . Allergic rhinitis 08/03/2007  . Asthma  08/03/2007  . Esophageal reflux 08/03/2007  . ECZEMA 08/03/2007  . OSTEOPENIA 08/03/2007  . ULCERATIVE COLITIS, HX OF 08/03/2007    Orientation RESPIRATION BLADDER Height & Weight     Self, Situation, Place  Normal Incontinent (has a urinal at bedside and urinates a lot)  Weight:   Height:     BEHAVIORAL SYMPTOMS/MOOD NEUROLOGICAL BOWEL NUTRITION STATUS      Incontinent (at times, can take himself to the bathroom/aware) using depends Diet (regular)  AMBULATORY STATUS COMMUNICATION OF NEEDS Skin   Extensive Assist  Uses wheelchair for transport Verbally Skin abrasions, Bruising  Skinned his knee.                         Personal Care Assistance Level of Assistance  Bathing, Feeding, Dressing Bathing Assistance: Maximum assistance Feeding assistance: Independent Dressing Assistance: Maximum assistance     Functional Limitations Info  Sight, Hearing, Speech Sight Info: Impaired (blind) Hearing Info: Adequate Speech Info: Adequate    SPECIAL CARE FACTORS FREQUENCY                       Contractures Contractures Info: Present (in feet)    Additional Factors Info  Code Status Code Status Info: Full Code Allergies Info: Bacitracin-polymyxin B, Bee Venom, Codeine, Norco Hydrocodone-acetaminophen, Penicillins, Shellfish-derived Products, Other, Tape, Chlorhexidine, Sulfa Antibiotics, Sulfate           Current Medications (01/20/2017):  This is the current hospital active medication list Current Facility-Administered Medications  Medication Dose Route Frequency Provider Last Rate Last Dose  .  albuterol (PROVENTIL HFA;VENTOLIN HFA) 108 (90 Base) MCG/ACT inhaler 2 puff  2 puff Inhalation Q6H PRN Benjiman CorePickering, Nathan, MD   2 puff at 01/19/17 1009  . fluticasone (FLONASE) 50 MCG/ACT nasal spray 2 spray  2 spray Each Nare BID Benjiman CorePickering, Nathan, MD   2 spray at 01/19/17 2126  . gabapentin (NEURONTIN) capsule 200 mg  200 mg Oral BID Benjiman CorePickering, Nathan, MD   200 mg at 01/19/17  2123  . hydrOXYzine (ATARAX/VISTARIL) tablet 25 mg  25 mg Oral Q6H PRN Benjiman CorePickering, Nathan, MD      . sertraline (ZOLOFT) tablet 25 mg  25 mg Oral Daily Benjiman CorePickering, Nathan, MD   25 mg at 01/19/17 1010  . tamsulosin (FLOMAX) capsule 0.4 mg  0.4 mg Oral Daily Benjiman CorePickering, Nathan, MD   0.4 mg at 01/19/17 1018   Current Outpatient Prescriptions  Medication Sig Dispense Refill  . diazepam (VALIUM) 10 MG tablet Take 1 tablet (10 mg total) by mouth 3 (three) times daily. 10 tablet 0  . albuterol (PROVENTIL HFA;VENTOLIN HFA) 108 (90 Base) MCG/ACT inhaler Inhale 2 puffs into the lungs every 6 (six) hours as needed for wheezing or shortness of breath. 1 Inhaler 0  . albuterol (PROVENTIL) (2.5 MG/3ML) 0.083% nebulizer solution Take 3 mLs (2.5 mg total) by nebulization every 4 (four) hours as needed for wheezing or shortness of breath. 360 mL 1  . bisacodyl (DULCOLAX) 10 MG suppository Place 1 suppository (10 mg total) rectally as needed for moderate constipation. 12 suppository 0  . fluticasone (FLONASE) 50 MCG/ACT nasal spray USE 2 SPRAYS IN EACH NOSTRIL TWICE DAILY. 16 g 3  . Fluticasone-Salmeterol (ADVAIR) 250-50 MCG/DOSE AEPB Inhale 1 puff into the lungs 2 (two) times daily.    Marland Kitchen. gabapentin (NEURONTIN) 100 MG capsule Take 2 capsules (200 mg total) by mouth 2 (two) times daily. 60 capsule 0  . hydrOXYzine (ATARAX/VISTARIL) 25 MG tablet Take 1 tablet (25 mg total) by mouth every 6 (six) hours as needed for itching. 30 tablet 0  . montelukast (SINGULAIR) 10 MG tablet Take 10 mg by mouth at bedtime.    . predniSONE (DELTASONE) 20 MG tablet Take 2 tablets (40 mg total) by mouth daily. (Patient not taking: Reported on 01/16/2017) 10 tablet 0  . sertraline (ZOLOFT) 100 MG tablet Take 200 mg by mouth daily.    . tamsulosin (FLOMAX) 0.4 MG CAPS capsule Take 1 capsule (0.4 mg total) by mouth daily. 30 capsule 0  . triamcinolone cream (KENALOG) 0.1 % Apply 1 application topically 2 (two) times daily. APPLY TO AFFECTED  AREAS OF BODY AS DIRECTED       Discharge Medications: Please see discharge summary for a list of discharge medications.  Relevant Imaging Results:  Relevant Lab Results:   Additional Information SSN:  [id number]    Raye SorrowCoble, Gailen Venne N, [place]

## 2017-01-20 NOTE — ED Notes (Signed)
Patient asked for urinal.  Pt voided but ended up getting underpad and gown wet.  Pt and underpad changed.

## 2017-01-20 NOTE — Clinical Social Work Placement (Signed)
   CLINICAL SOCIAL WORK PLACEMENT  NOTE  Date:  01/20/2017  Patient Details  Name: [name] PattenRoger D [name] MRN: [id number] Date of Birth: 1950/06/28  Clinical Social Work is seeking post-discharge placement for this patient at the Skilled  Nursing Facility level of care (*CSW will initial, date and re-position this form in  chart as items are completed):  Yes   Patient/family provided with [facility] Clinical Social Work Department's list of facilities offering this level of care within the geographic area requested by the patient (or if unable, by the patient's family).  Yes   Patient/family informed of their freedom to choose among providers that offer the needed level of care, that participate in Medicare, Medicaid or managed care program needed by the patient, have an available bed and are willing to accept the patient.  Yes   Patient/family informed of [facility]'s ownership interest in [facility] and [facility], as well as of the fact that they are under no obligation to receive care at these facilities.  PASRR submitted to EDS on 01/20/17     PASRR number received on       Existing PASRR number confirmed on       FL2 transmitted to all facilities in geographic area requested by pt/family on 01/20/17     FL2 transmitted to all facilities within larger geographic area on 01/20/17     Patient informed that his/her managed care company has contracts with or will negotiate with certain facilities, including the following:            Patient/family informed of bed offers received.  Patient chooses bed at       Physician recommends and patient chooses bed at      Patient to be transferred to   on  .  Patient to be transferred to facility by       Patient family notified on   of transfer.  Name of family member notified:        PHYSICIAN Please sign FL2     Additional Comment:  Passar currently under manual review.  Passar  pending.  _______________________________________________ Raye Sorrowoble, Kaydyn Sayas N, LCSW 01/20/2017, 2:45 PM

## 2017-01-20 NOTE — ED Notes (Signed)
Pt using urinal w/o difficulty. Emptied for pt. Pt eating Malaw[place]urkey sandwich given.

## 2017-01-20 NOTE — ED Notes (Signed)
Patient taken his condom cath off, and It was replaced again.

## 2017-01-20 NOTE — ED Notes (Signed)
Pt lying on bed, alert. States he wants to go back to his home and that he wants to speak w/his landlord - states he is unable to recall his name and does not know his phone number at this time. Pt has been using urinal w/o difficulty.

## 2017-01-21 DIAGNOSIS — Z590 Homelessness: Secondary | ICD-10-CM | POA: Diagnosis not present

## 2017-01-21 NOTE — ED Notes (Signed)
Pt awake and alert eating breakfast. No complaints. Compliant with medications this morning.

## 2017-01-21 NOTE — ED Notes (Signed)
Pt had BM.  Pt took chux out from under himself.  Pt's hands had to be cleaned.  Pt and bed cleaned and changed.  Pt educated on calling staff when he has an accident.  Pt said he would try.

## 2017-01-21 NOTE — ED Notes (Signed)
Dinner tray ordered and cookie/sprite given for snack.

## 2017-01-21 NOTE — ED Notes (Signed)
Lunch at bedside. Patient states he is not ready to eat at this time.  He said he will let me know when he is, so I can warm food up.

## 2017-01-21 NOTE — Progress Notes (Signed)
LCSW continues to follow for placement  [facility] representative was called and given background information on patient. Discussed need for long term placement in SNF.   Plan  Is to assess patient this evening and follow up with number in the morning. [facility] is aware of how to reach LCSW if additional information is necessary.  LCSW continues to seek an appropriate bed for patient.  Will continue to follow.  [name] EmoryHannah Jamont Mellin LCSW, MSW Clinical Social Work: Optician, dispensingystem Wide Float Coverage for :  [phone]

## 2017-01-21 NOTE — ED Notes (Signed)
Pt c/o heartburn.  Will engage provider.

## 2017-01-21 NOTE — ED Notes (Signed)
Pt given cookies and sprite for snack.

## 2017-01-21 NOTE — ED Notes (Addendum)
Pt stripped off chux and brief and wet the chux.  Pt changed and chux replaced.  Pt advised to notify staff when he has to pee.

## 2017-01-21 NOTE — ED Notes (Signed)
Dinner at bedside

## 2017-01-21 NOTE — ED Notes (Signed)
Pt given own prescription cream to put on face.

## 2017-01-21 NOTE — ED Notes (Signed)
Breakfast order placed ?

## 2017-01-22 DIAGNOSIS — Z590 Homelessness: Secondary | ICD-10-CM | POA: Diagnosis not present

## 2017-01-22 NOTE — ED Notes (Signed)
Pt in hospital bed, set up for breakfast, breakfast reheated and pt  Is eating  After being shown where everything is on plate , pt is blind ,urinal emptied , pt awaiting placement

## 2017-01-22 NOTE — ED Notes (Signed)
Per Child psychotherapistsocial worker pt is to go [facility] today

## 2017-01-22 NOTE — ED Notes (Signed)
Patient's urinal emptied and bedside cleaning done

## 2017-01-22 NOTE — Progress Notes (Signed)
LCSW continues to follow for placement.  LCSW has spoke with APS worker Salome Spottedia Turner.  Petition has been filed and copy placed on patient's chart in ED for scanning purposes.  Guardian at [facility]:  [name]  [phone] Hearing:  January 29 2017 at 11am  [facility] discussed [facility] and clarification regarding if accepted and able to admit for placement. Shelia reviewing referral and will call back with final answer this evening.  Guardian at [facility] can sign patient into facility once bed has been selected and able to admit. Passar remains to be pending, however anticipate it completed this evening or 01/23/17.   Will continue to follow  [name] EmoryHannah Naya Ilagan LCSW, MSW Clinical Social Work: System Wide Float Coverage for :  [phone]

## 2017-01-22 NOTE — ED Notes (Signed)
Pt had  incontinence  with stool, pt was cleaned up and was placed on a clean brief.

## 2017-01-22 NOTE — ED Notes (Signed)
Dinner Tray ordered 

## 2017-01-22 NOTE — ED Notes (Signed)
Patient cleaned up from dinner, new gown given, and bedside cleaning done.   Sat with patient and had a chat about his life and needs.  Patient then made more comfortable and encouraged to rest.

## 2017-01-22 NOTE — ED Notes (Signed)
Dinner tray delivered.  This RN sat with patient to assist him with eating and drinking dinner.

## 2017-01-23 DIAGNOSIS — Z590 Homelessness: Secondary | ICD-10-CM | POA: Diagnosis not present

## 2017-01-23 NOTE — ED Notes (Signed)
Lunch tray delivered.

## 2017-01-23 NOTE — ED Notes (Signed)
Patient was given Shortbread Cookies and Cranberry Juice for Snack.

## 2017-01-23 NOTE — ED Notes (Signed)
Conversation between SW/RN and pt relayed to EDP. EDP gave verbal order for TTS based on SI comments from pt.

## 2017-01-23 NOTE — BH Assessment (Addendum)
Tele Assessment Note   Patient Name: [name] [name] MRN: [id number] Referring Physician: Roderic Palau Location of Patient: [facility] Location of Provider: Tina is an 66 y.o. male who presents to the ED with multiple medical problems. Per CSW, CSW and RN met with patient to discuss paperwork that had been served to him. CSW explained that the documentation was a notification of court hearing on September 13 to have a guardian appointed. Patient was also notified of plan for him to discharge to [facility]. Patient began yelling "NO! NO!" Patient stated he was in an institution as a child for 12 years. He later went to work there. Patient adamant that he was not going to be "institutionalized" again. CSW explained that now that guardianship is being arranged, we had to abide by the guardian's requests. Patient upset and wanted to know who made that decision. Discussed that it began with medical staff concern, the psychiatrist documenting that he did not have capacity to make his own decisions, and then the petition for guardianship. Patient became very upset and said "well you can just write suicide on there then." He would not expand on this statement after requests from both CSW and RN. He would either be silent or say "I'm leaving it at that." He also would not answer when RN asked if he had intention of hurting others. CSW and RN answered all questions"   During assessment, pt denies SI, HI, AVH, SA , past attempts, and states, "I just said those things because I got mad, I did not mean it, and I have calmed down now". He states that he wants to seek [facility] services in his new facility. He states that he wants to live independently. He states that he wants to know the name of the judge who made the decision. Pt reports that he has taken depression medication in the past, but he felt that it did not help. Pt denies any IP psych history.  Pt states current stressors  include moving.  Pt has few supports. Pt denies history of abuse and trauma. Pt has poor insight and  judgment. Pt's memory is typical.  Pt denies legal history. ?? MSE: Pt is casually dressed, alert, oriented x4 with normal speech and restless motor behavior. 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 Pt's mood is depressed and affect is depressed and anxious. Affect is congruent with mood. Thought process is coherent and relevant. There is no indication Pt is currently responding to internal stimuli or experiencing delusional thought content. Pt was cooperative throughout assessment.   Per Shuvon Rankin, NP, pt does not meet IP criteria. Writer encouraged pt to seek services in new facility.  Diagnosis: Adjustment Disorder  Past Medical History:  Past Medical History:  Diagnosis Date  . Anxiety   . Arthritis    spine  . Chronic lower back pain   . Contact dermatitis and other eczema, due to unspecified cause   . COPD (chronic obstructive pulmonary disease) ([place])   . Cough   . Depression    patient constantly hitting left side of face "due to pain"  . Enlarged prostate   . Esophageal reflux   . H/O hiatal hernia   . Headache    PMH: migraines  . Hep C w/o coma, chronic ([place]) early 2013  . History of eye prosthesis   . History of kidney stones   . Hypertension    not on medication at this time  .  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 Multiple facial bone fractures ([place])   . Multiple open wounds    "from esczema- scratching"  . Osteopenia   . Pancreatitis   . Pneumonia    in past had several times  . Seasonal allergies   . Ulcerative colitis   . Unspecified asthma(493.90)   . Unspecified sinusitis (chronic)     Past Surgical History:  Procedure Laterality Date  . CARPAL TUNNEL RELEASE Right 06/15/2013   Procedure: RIGHT CARPAL TUNNEL RELEASE;  Surgeon: Schuyler Amor, MD;  Location: [place];  Service: Orthopedics;   Laterality: Right;  . COLONOSCOPY W/ BIOPSIES AND POLYPECTOMY    . ENUCLEATION Left   . EYE SURGERY     3 Corneal transplant (bilateral eyes).    . FRACTURE SURGERY    . HARDWARE REMOVAL  10/09/2011   Procedure: HARDWARE REMOVAL;  Surgeon: Newt Minion, MD;  Location: [place];  Service: Orthopedics;  Laterality: Right;  Removal Deep Hardware Right Wrist, placement antibiotic beads.  . INGUINAL HERNIA REPAIR Left    age 76  . Litrotripsy    . metacarpal pinning Right    right hand  . NERVE, TENDON AND ARTERY REPAIR Right 12/05/2015   Procedure: RIGHT WRIST, HAND FLEXOR TENDON LENGTHENING;  Surgeon: Charlotte Crumb, MD;  Location: [place];  Service: Orthopedics;  Laterality: Right;  . NEUROLYSIS OF MEDIAN NERVE Right 12/05/2015   Procedure: RIGHT MEDIAN NERVE NEUROLYSIS ;  Surgeon: Charlotte Crumb, MD;  Location: [place];  Service: Orthopedics;  Laterality: Right;  . ORIF DISTAL RADIUS FRACTURE  09/12/11   right  . ORIF WRIST FRACTURE Right 06/15/2013   Procedure: RIGHT RADIUS PSTEOTOMY, DISTAL ULNA RESECTION, AND DIGITAL FLEXOR LENGTHENING AS NEEDED;  Surgeon: Schuyler Amor, MD;  Location: [place];  Service: Orthopedics;  Laterality: Right;  . PATELLA FRACTURE SURGERY     right  . RADIOLOGY WITH ANESTHESIA N/A 03/09/2014   Procedure: MRI - Cervical and Lumbar without Contrast;  Surgeon: Medication Radiologist, MD;  Location: [place];  Service: Radiology;  Laterality: N/A;  . RADIOLOGY WITH ANESTHESIA N/A 04/03/2015   Procedure: MRI OF BRAIN    (RADIOLOGY WITH ANESTHESIA);  Surgeon: Medication Radiologist, MD;  Location: [place];  Service: Radiology;  Laterality: N/A;  . TONSILLECTOMY AND ADENOIDECTOMY      Family History:  Family History  Problem Relation Age of Onset  . Allergies Mother        atopic  . Asthma Mother   . Heart failure Mother        CHF  . Other Father   . Anesthesia problems Neg Hx     Social History:  reports that he quit smoking about 33 years  ago. His smoking use included Cigarettes and Pipe. He has a 3.00 pack-year smoking history. He has never used smokeless tobacco. He reports that he drinks alcohol. He reports that he uses drugs, including Marijuana.  Additional Social History:  Alcohol / Drug Use Pain Medications: denies Prescriptions: denies Over the Counter: denies History of alcohol / drug use?: No history of alcohol / drug abuse Longest period of sobriety (when/how long): denies  CIWA: CIWA-Ar BP: (!) 156/87 Pulse Rate: 74 COWS:    PATIENT STRENGTHS: (choose at least two) Ability for insight Communication skills  Allergies:  Allergies  Allergen Reactions  . Bacitracin-Polymyxin B Other (See Comments)    Pt states that his ophthalmologist told  him he was allergic to this.     . Bee Venom Anaphylaxis  . Codeine Anaphylaxis  . Norco [Hydrocodone-Acetaminophen] Other (See Comments)    Headaches   . Penicillins Anaphylaxis and Other (See Comments)    Tolerated numerous cephalosporins in past. Has patient had a PCN reaction causing immediate rash, facial/tongue/throat swelling, SOB or lightheadedness with hypotension: Yes Has patient had a PCN reaction causing severe rash involving mucus membranes or skin necrosis: No Has patient had a PCN reaction that required hospitalization: No Has patient had a PCN reaction occurring within the last 10 years: No If all of the above answers are "NO", then may proceed with Cephalosporin use.  Marland Kitchen Shellfish-Derived Products Anaphylaxis  . Other Hives and Other (See Comments)    Pt states that he is allergic to dust, pollen, and mold.   . Tape Hives  . Chlorhexidine Itching and Rash  . Sulfa Antibiotics Itching and Rash  . Sulfate Itching and Rash    Home Medications:  (Not in a hospital admission)  OB/GYN Status:  No LMP for male patient.  General Assessment Data Location of Assessment: [facility] ED TTS Assessment: In system Is this a Tele or Face-to-Face Assessment?: Tele  Assessment Is this an Initial Assessment or a Re-assessment for this encounter?: Initial Assessment Marital status: Single Living Arrangements:  (moving to assisted living facility) Can pt return to current living arrangement?: No Admission Status: Voluntary Is patient capable of signing voluntary admission?: No Referral Source: Self/Family/Friend Insurance type: [facility]     Crisis Care Plan Living Arrangements:  (moving to assisted living facility) Name of Psychiatrist:  (no) Name of Therapist: no  Education Status Is patient currently in school?: No  Risk to self with the past 6 months Suicidal Ideation: No Has patient been a risk to self within the past 6 months prior to admission? : No Suicidal Intent: No Has patient had any suicidal intent within the past 6 months prior to admission? : No Is patient at risk for suicide?: No Suicidal Plan?: No Has patient had any suicidal plan within the past 6 months prior to admission? : No Access to Means: No What has been your use of drugs/alcohol within the last 12 months?:  (denies) Previous Attempts/Gestures: No Intentional Self Injurious Behavior: None Family Suicide History: Unknown Recent stressful life event(s):  (moving to assisted living) Persecutory voices/beliefs?: No Depression: Yes Depression Symptoms: Fatigue, Feeling angry/irritable Substance abuse history and/or treatment for substance abuse?: No Suicide prevention information given to non-admitted patients: Not applicable  Risk to Others within the past 6 months Homicidal Ideation: No Does patient have any lifetime risk of violence toward others beyond the six months prior to admission? : No Thoughts of Harm to Others: No Current Homicidal Intent: No Current Homicidal Plan: No Access to Homicidal Means: No History of harm to others?: No Assessment of Violence: None Noted Does patient have access to weapons?: No Criminal Charges Pending?: No Does patient have a  court date: No Is patient on probation?: No  Psychosis Hallucinations: None noted Delusions: None noted  Mental Status Report Appearance/Hygiene: Disheveled, Unremarkable Eye Contact: Unable to Assess Motor Activity: Restlessness Speech: Logical/coherent Level of Consciousness: Quiet/awake Mood: Pleasant Affect: Appropriate to circumstance Anxiety Level: Moderate Thought Processes: Coherent, Relevant Judgement: Impaired Orientation: Person, Place, Situation Obsessive Compulsive Thoughts/Behaviors: Minimal  Cognitive Functioning Concentration: Normal Memory: Recent Intact, Remote Intact IQ: Average Insight: Fair Impulse Control: Fair Appetite: Good Weight Loss: 10 Weight Gain: 0 Sleep: Decreased Total Hours of  Sleep: 6 Vegetative Symptoms: Decreased grooming  ADLScreening [facility] Assessment Services) Patient's cognitive ability adequate to safely complete daily activities?: No Patient able to express need for assistance with ADLs?: Yes Independently performs ADLs?: Yes (appropriate for developmental age)  Prior Inpatient Therapy Prior Inpatient Therapy: No  Prior Outpatient Therapy Prior Outpatient Therapy: No Does patient have an ACCT team?: No Does patient have Intensive In-House Services?  : No Does patient have [facility] services? : No Does patient have P4CC services?: No  ADL Screening (condition at time of admission) Patient's cognitive ability adequate to safely complete daily activities?: No Is the patient deaf or have difficulty hearing?: No Does the patient have difficulty seeing, even when wearing glasses/contacts?: Yes Does the patient have difficulty concentrating, remembering, or making decisions?: Yes Patient able to express need for assistance with ADLs?: Yes Does the patient have difficulty dressing or bathing?: Yes Independently performs ADLs?: Yes (appropriate for developmental age) Does the patient have difficulty walking or climbing stairs?:  No Weakness of Legs: None Weakness of Arms/Hands: None  Home Assistive Devices/Equipment Home Assistive Devices/Equipment: None    Abuse/Neglect Assessment (Assessment to be complete while patient is alone) Physical Abuse: Denies Verbal Abuse: Denies Sexual Abuse: Denies Exploitation of patient/patient's resources: Denies Self-Neglect: Denies Values / Beliefs Cultural Requests During Hospitalization: None Spiritual Requests During Hospitalization: None Consults Spiritual Care Consult Needed: No Social Work Consult Needed: No Regulatory affairs officer (For Healthcare) Does Patient Have a Medical Advance Directive?: No Would patient like information on creating a medical advance directive?: No - Patient declined Nutrition Screen- [facility] Adult/[facility]/AP Patient's home diet: Regular  Additional Information 1:1 In Past 12 Months?: No CIRT Risk: No Elopement Risk: No Does patient have medical clearance?: Yes     Disposition:  Disposition Initial Assessment Completed for this Encounter: Yes Disposition of Patient: Outpatient treatment Type of outpatient treatment: Adult  This service was provided via telemedicine using a 2-way, interactive audio and video technology.  Names of all persons participating in this telemedicine service and their role in this encounter. Name:Dr. Roderic Palau Role: EDP-ordered TTS  Name:  Role:   Name: Role:   Name:  Role:     Sheliah Hatch 01/23/2017 4:37 PM

## 2017-01-23 NOTE — ED Notes (Signed)
Regular Diet Ordered for Dinner. 

## 2017-01-23 NOTE — ED Notes (Addendum)
This RN explained and read "notice of hearing..." paperwork that was delivered by deputy prior to RN arrival to pod. Pt informed RN that nobody explained what the paperwork stated. Social work notified.

## 2017-01-23 NOTE — [place] FL2 (Signed)
[place] MEDICAID FL2 LEVEL OF CARE SCREENING TOOL     IDENTIFICATION  Patient Name: [name] [name] Birthdate: June 20, 1950 Sex: male Admission Date (Current Location): 01/16/2017  [place] and [place] Number:  Haynes Bast [id number] S Facility and Address:  [facility], [address] [place], [place] [address]      Provider Number: [id number]  Attending Physician Name and Address:  No att. providers found  Relative Name and Phone Number:  Work up for guardianship  Nelida Meuse [facility]  [phone]     Current Level of Care: Hospital Recommended Level of Care: Skilled Nursing Facility Prior Approval Number:    Date Approved/Denied:   PASRR Number:   [id number]  Discharge Plan: SNF    Current Diagnoses: Patient Active Problem List   Diagnosis Date Noted  . 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 Major depressive disorder with psychotic features (HCC) 11/16/2016  . Lumbar back pain 02/04/2016  . AKI (acute kidney injury) (HCC) 01/08/2016  . Closed compression fracture of L3 lumbar vertebra (HCC) 01/08/2016  . Pruritus 10/01/2015  . Acute maxillary sinusitis 06/03/2014  . Severe asthma   . Acute on chronic respiratory failure (HCC) 05/31/2014  . Chronic cough 05/31/2014  . Asthma with acute exacerbation 05/31/2014  . Chronic pain syndrome 05/31/2014  . Malnutrition of moderate degree (HCC) 05/30/2014  . Hypokalemia 05/28/2014  . SOB (shortness of breath) 05/28/2014  . Weakness of back 02/03/2014  . Weakness of both legs 02/03/2014  . Gait instability 12/01/2013  . Allergic contact dermatitis 12/24/2011  . 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 Wrist joint infection (HCC) 10/10/2011    Class: Diagnosis of  . Urinary retention 09/16/2011  . 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 TACHYCARDIA 07/16/2010  . BLINDNESS 10/27/2008  . Sinusitis, chronic 08/03/2007  . Allergic rhinitis 08/03/2007   . Asthma 08/03/2007  . Esophageal reflux 08/03/2007  . ECZEMA 08/03/2007  . OSTEOPENIA 08/03/2007  . ULCERATIVE COLITIS, HX OF 08/03/2007    Orientation RESPIRATION BLADDER Height & Weight     Self, Situation, Place  Normal Incontinent (uisng condom cath while in hospital) Weight:  160 lbs Height:   5'11"  BEHAVIORAL SYMPTOMS/MOOD NEUROLOGICAL BOWEL NUTRITION STATUS   Has been calm, cooperative.  None Incontinent of bowel. Continent of urine. Uses depends and urinal. Diet (regular)  AMBULATORY STATUS COMMUNICATION OF NEEDS Skin   Extensive Assist Verbally   Skin abrasions                     Personal Care Assistance Level of Assistance  Bathing, Feeding, Dressing Bathing Assistance: Maximum assistance Feeding assistance: Independent Dressing Assistance: Maximum assistance     Functional Limitations Info  Sight, Hearing, Speech Sight Info: Impaired (blind) Hearing Info: Adequate Speech Info: Adequate    SPECIAL CARE FACTORS FREQUENCY   Psychotropic  Major Depressive Disorder with psychotic features, Adjustment Disorder with mixed disturbance of emotions and conduct: Zoloft 25 mg PO daily, Hydroxyzine 25 mg PO every 6 hours prn                  Contractures Contractures Info: Present (in feet)    Additional Factors Info  Code Status Code Status Info: Full Code Allergies Info: Bacitracin-polymyxin B, Bee Venom, Codeine, Norco Hydrocodone-acetaminophen, Penicillins, Shellfish-derived Products, Other, Tape, Chlorhexidine, Sulfa Antibiotics, Sulfate           Current Medications (01/23/2017):  This is the current hospital active medication list  Current Facility-Administered Medications  Medication Dose Route Frequency Provider Last Rate Last Dose  . albuterol (PROVENTIL HFA;VENTOLIN HFA) 108 (90 Base) MCG/ACT inhaler 2 puff  2 puff Inhalation Q6H PRN Benjiman CorePickering, Nathan, MD   2 puff at 01/19/17 1009  . alum & mag hydroxide-simeth (MAALOX/MYLANTA) 200-200-20 MG/5ML  suspension 30 mL  30 mL Oral Q6H PRN Muthersbaugh, Hannah, PA-C   30 mL at 01/20/17 2358  . fluticasone (FLONASE) 50 MCG/ACT nasal spray 2 spray  2 spray Each Nare BID Benjiman CorePickering, Nathan, MD   2 spray at 01/22/17 2138  . gabapentin (NEURONTIN) capsule 200 mg  200 mg Oral BID Benjiman CorePickering, Nathan, MD   200 mg at 01/23/17 1100  . hydrOXYzine (ATARAX/VISTARIL) tablet 25 mg  25 mg Oral Q6H PRN Benjiman CorePickering, Nathan, MD   25 mg at 01/21/17 0005  . ibuprofen (ADVIL,MOTRIN) tablet 600 mg  600 mg Oral Q8H PRN Muthersbaugh, Hannah, PA-C      . ondansetron (ZOFRAN) tablet 4 mg  4 mg Oral Q8H PRN Muthersbaugh, Hannah, PA-C      . sertraline (ZOLOFT) tablet 25 mg  25 mg Oral Daily Benjiman CorePickering, Nathan, MD   25 mg at 01/23/17 1100  . tamsulosin (FLOMAX) capsule 0.4 mg  0.4 mg Oral Daily Benjiman CorePickering, Nathan, MD   0.4 mg at 01/23/17 1100   Current Outpatient Prescriptions  Medication Sig Dispense Refill  . diazepam (VALIUM) 10 MG tablet Take 1 tablet (10 mg total) by mouth 3 (three) times daily. 10 tablet 0  . albuterol (PROVENTIL HFA;VENTOLIN HFA) 108 (90 Base) MCG/ACT inhaler Inhale 2 puffs into the lungs every 6 (six) hours as needed for wheezing or shortness of breath. 1 Inhaler 0  . albuterol (PROVENTIL) (2.5 MG/3ML) 0.083% nebulizer solution Take 3 mLs (2.5 mg total) by nebulization every 4 (four) hours as needed for wheezing or shortness of breath. 360 mL 1  . bisacodyl (DULCOLAX) 10 MG suppository Place 1 suppository (10 mg total) rectally as needed for moderate constipation. 12 suppository 0  . fluticasone (FLONASE) 50 MCG/ACT nasal spray USE 2 SPRAYS IN EACH NOSTRIL TWICE DAILY. 16 g 3  . Fluticasone-Salmeterol (ADVAIR) 250-50 MCG/DOSE AEPB Inhale 1 puff into the lungs 2 (two) times daily.    Marland Kitchen. gabapentin (NEURONTIN) 100 MG capsule Take 2 capsules (200 mg total) by mouth 2 (two) times daily. 60 capsule 0  . hydrOXYzine (ATARAX/VISTARIL) 25 MG tablet Take 1 tablet (25 mg total) by mouth every 6 (six) hours as needed  for itching. 30 tablet 0  . montelukast (SINGULAIR) 10 MG tablet Take 10 mg by mouth at bedtime.    . predniSONE (DELTASONE) 20 MG tablet Take 2 tablets (40 mg total) by mouth daily. (Patient not taking: Reported on 01/16/2017) 10 tablet 0  . sertraline (ZOLOFT) 100 MG tablet Take 200 mg by mouth daily.    . tamsulosin (FLOMAX) 0.4 MG CAPS capsule Take 1 capsule (0.4 mg total) by mouth daily. 30 capsule 0  . triamcinolone cream (KENALOG) 0.1 % Apply 1 application topically 2 (two) times daily. APPLY TO AFFECTED AREAS OF BODY AS DIRECTED       Discharge Medications: Please see discharge summary for a list of discharge medications.  Relevant Imaging Results:  Relevant Lab Results:   Additional Information SSN:  [id number]    Margarito LinerSarah C Elver Stadler, LCSW

## 2017-01-23 NOTE — Clinical Social Work Note (Addendum)
PASARR obtained: [id number] F. [facility] morning [facility] meeting.  Charlynn CourtSarah Tata Timmins, CSW [phone]  10:17 am CSW left voicemail for admissions coordinator.  Charlynn CourtSarah Judea Fennimore, CSW [phone]  12:22 pm CSW spoke with admissions coordinator. She is concerned that patient has no skillable needs. She is also asking for an updated FL2. FL2 completed and sent to her. Discussed case with Chiropodistassistant director of social work regarding skillable needs. CSW called admissions coordinator again to discuss. Left a voicemail.  Charlynn CourtSarah Orrie Schubert, CSW [phone]  1:38 pm Admissions coordinator is going to call patient's guardian ad litem regarding doing paperwork.  Charlynn CourtSarah Cricket Goodlin, CSW [phone]

## 2017-01-23 NOTE — ED Notes (Signed)
Patient was given [place] [employer] Crackers and Ginger Ale, and A Regular Diet was ordered for Lunch.

## 2017-01-23 NOTE — ED Notes (Signed)
Lawyer at bedside explaining process of pt placement.

## 2017-01-23 NOTE — ED Notes (Signed)
TTS in progress 

## 2017-01-23 NOTE — ED Notes (Signed)
Patient ate 100% of his Lunch.

## 2017-01-23 NOTE — ED Notes (Addendum)
Social worker discussed plan of care with pt regarding legal guardianship and upcoming court dates. Pt became irate and stated he "will not go back to an institution", making several comments alluding to suicide. Social work and Charity fundraiserN were both witness to the comments made by pt. EDP notified.

## 2017-01-23 NOTE — Clinical Social Work Note (Signed)
CSW and RN met with patient to discuss paperwork that had been served to him. CSW explained that the documentation was a notification of court hearing on September 13 to have a guardian appointed. Patient was also notified of plan for him to discharge to [facility]. Patient began yelling "NO! NO!" Patient stated he was in an institution as a child for 12 years. He later went to work there. Patient adamant that he was not going to be "institutionalized" again. CSW explained that now that guardianship is being arranged, we had to abide by the guardian's requests. Patient upset and wanted to know who made that decision. Discussed that it began with medical staff concern, the psychiatrist documenting that he did not have capacity to make his own decisions, and then the petition for guardianship. Patient became very upset and said "well you can just write suicide on there then." He would not expand on this statement after requests from both CSW and RN. He would either be silent or say "I'm leaving it at that." He also would not answer when RN asked if he had intention of hurting others. CSW and RN answered all questions.  [name] [name], [place]

## 2017-01-24 DIAGNOSIS — Z590 Homelessness: Secondary | ICD-10-CM | POA: Diagnosis not present

## 2017-01-24 NOTE — Progress Notes (Addendum)
LCSW continues to follow for placement  [facility] and [facility] is [facility] cleared Note in chart and confirmed  LCSW discussed with AD plans to sign him in if facility [facility] AD agreeable [facility] and message left to see if patient can be admitted over the weekend as early as today. Will follow up.  12:41 PM  Discussed with AD and Shelia. [facility]  01/26/17 AD will sign patient into facility in AM. Will update RN regarding plan  Deretha EmoryHannah [place] Foerster LCSW, MSW Clinical Social Work: System Wide Float Coverage for :  [phone]

## 2017-01-24 NOTE — ED Notes (Signed)
Breakfast tray ordered 

## 2017-01-24 NOTE — ED Notes (Signed)
Belongings inventoried and placed in locker # 3.  Medication counted and to take to pharmacy.  Large amount of clothing, hygiene products in 2 bags.

## 2017-01-24 NOTE — Progress Notes (Signed)
Patient [facility], on 01/23/17. MC-ED RN Ledon SnareBeckie has been just informed on 01/24/17.  Melbourne Abtsatia Pallavi Clifton, MSW, LCSWA Clinical social worker in disposition [facility], TTS Office

## 2017-01-24 NOTE — ED Provider Notes (Signed)
Psychiatric Boarding Rounding Note:  Patient has been cleared by psychiatry and is now pending nursing home placement tomorrow.   On my evaluation the patient is eating supper without complaint.   Most Recent Vitals:  Vitals:   01/23/17 1208 01/23/17 2308 01/24/17 0726 01/24/17 1315  BP: (!) 156/87 (!) 146/94 (!) 163/104 (!) 143/83  Pulse: 74 81 83 88  Resp: 20 18 20 20   Temp: 97.7 F (36.5 C) 98 F (36.7 C) 97.7 F (36.5 C) 98.3 F (36.8 C)  TempSrc: Oral Oral Oral Oral  SpO2: 96% 95% 98% 96%   Disposition:  Patient still seems medically stable. We'll continue awaiting for nursing home placement in AM hopefully, no other needs identified.    Odessa Morren, Barbara CowerJason, MD 01/25/17 [phone]

## 2017-01-24 NOTE — ED Notes (Signed)
Gave pt [name] [name] sandwich and Sprite as requested.

## 2017-01-24 NOTE — ED Notes (Signed)
Per Dahlia ClientHannah, SW, pt to be [facility] (01/25/17).

## 2017-01-24 NOTE — ED Notes (Signed)
Pt given graham crackers with peanut butter and cranberry for snack

## 2017-01-24 NOTE — ED Notes (Signed)
Dr Mesner in w/pt. 

## 2017-01-24 NOTE — ED Notes (Signed)
Encouraged pt to eat breakfast.

## 2017-01-24 NOTE — ED Notes (Signed)
Continues to be upset that belongings have been removed.  Throwing urinal in the room.

## 2017-01-24 NOTE — ED Notes (Addendum)
Patient refusing medication at this time.  States it is too late to take it now.  Belongings removed from room at this time.  Explained to patient reason due to SI.  Notably upset.

## 2017-01-25 DIAGNOSIS — Z590 Homelessness: Secondary | ICD-10-CM | POA: Diagnosis not present

## 2017-01-25 NOTE — ED Notes (Signed)
[name] MorelSheila, [name] [name], spoke w/pt. Pt states he refuses to go to nursing home only because the facility will take all of his money each month except $37.00. Pt noted to become irritable - stating he did not want to go. RN spoke w/pt - allowed him to vent his feelings. States he wants to go back to his home - advised him his home is condemned so unable to do so. States no one spoke w/him about this. Advised pt, per report, 2 people came and spoke w/him x 2 days ago. Pt then recalls - then states "Well, you've only given me 2 days to think about this". Spoke w/pt re: multiple ED visits and ED is ensuring pt has a "safe discharge" - also spoke w/pt about him requiring assistance w/his ADL's at this time and maybe once he gains his strength at the facility, he can move to assisted living. Pt continues to be upset w/tx plan.

## 2017-01-25 NOTE — Care Management (Signed)
CM noted disposition plan patient will [facility] tomorrow 01/26/17 as per ED CSW..Marland Kitchen

## 2017-01-25 NOTE — ED Provider Notes (Signed)
  Physical Exam  BP (!) 141/86   Pulse 69   Temp 98.1 F (36.7 C) (Oral)   Resp 18   SpO2 94%   Physical Exam  ED Course  Procedures  MDM Patient here with nursing home placement. Was homeless, was discharged initially and then came back in. Was deemed incompetent by psychiatry, social work is working on placement to nursing facility. No issues per nursing. On home meds.        Charlynne PanderYao, David Hsienta, MD 01/25/17 [phone]

## 2017-01-25 NOTE — Progress Notes (Signed)
Continue to follow for placement/disposition  [facility] Shelia came to see patient early this morning with intent to admit him today with AD Wandra MannanZack Brooks to sign patient into facility.  Silvio PateShelia reports patient was adamant about not going and she cannot force admission. Patient reports he is not giving up his check and only having 37 dollars left even though he has no where to go that is safe, warm, and sustaining.    Patient unable to articulate what he will do, but he is clear in stating he will not go. Silvio PateShelia will not admit today due to the above and risk of facility, staff, and other residents.  LCSW called AD to cancel coming into hospital to sign paperwork. AD to follow up on Monday with attending ED SW to address plans and next steps.  About 10 minutes after this encounter, RN Kriste BasqueBecky called stating he is willing to go at this time. LCSW will explore with AD and patient on Monday (along with [facility]) plans to admit Monday if patient is still willing to go. Management consultantBecky RN and staff sitter will continue to talk and work with patient on benefits of going to SNF.  Deretha EmoryHannah Devyn Griffing LCSW, MSW Clinical Social Work: Optician, dispensingystem Wide Float Coverage for :  [phone]

## 2017-01-25 NOTE — ED Notes (Signed)
Dahlia ClientHannah, SW, aware pt states he will go to facility willingly. Pt aware will now be tomorrow d/t his behavior w/[name], [facility]. Pt noted to be calm, cooperative.

## 2017-01-26 DIAGNOSIS — Z590 Homelessness: Secondary | ICD-10-CM | POA: Diagnosis not present

## 2017-01-26 NOTE — Clinical Social Work Note (Signed)
CSW talked with nurse case manager, Durward Mallardamille and Assistance CSW Director regarding patient and discharge disposition. Per CSW notes, the hearing for guardianship is 9/13 at 11 am. Timothy LassoZack reported that he is working on facility placement for patient at [facility] and will maintain contact with facility until decision made by facility on accepting patient. ED CSW will continue to follow and provide intervention services and assist with discharge to skilled facility on or after 9/13.  Genelle BalVanessa Drew Lips, MSW, LCSW Licensed Clinical Social Worker Clinical Social Work Department [facility] [phone]

## 2017-01-26 NOTE — ED Notes (Signed)
Breakfast Tray Ordered. 

## 2017-01-26 NOTE — ED Notes (Signed)
CSW reports that DSS has hearing at 8611 am on Sept 13 th to get guardianship over patient. Patient should have placement at SNF after hearing, [place].

## 2017-01-26 NOTE — ED Notes (Signed)
Patient 100% of his meal.

## 2017-01-26 NOTE — ED Notes (Signed)
Snack given.

## 2017-01-26 NOTE — Discharge Planning (Signed)
EDCM and SW spoke with SW ChiropodistAssistant Director, Zack regarding pt disposition.  Timothy LassoZack states that with pt recent outbursts at discharge, pt will need to remain here until DSS custody hearing 9/13 @11 :00.  Pt will then transfer to residence provided by guardian.

## 2017-01-26 NOTE — ED Notes (Signed)
Patient was Given Cookies and Ginger Ale, and A Regular Diet was ordered for [name] LeeDinner.

## 2017-01-26 NOTE — ED Notes (Signed)
Breakfast ordered regular  

## 2017-01-26 NOTE — ED Notes (Signed)
Patient was given Henderson CloudGraham Crackers and Ginger Ale, and A Lunch order was taken.

## 2017-01-26 NOTE — ED Notes (Signed)
Pt set up with breakfast, given extra oj and given urinal

## 2017-01-26 NOTE — ED Notes (Signed)
Pt assisted with bath, given drink with meds

## 2017-01-26 NOTE — ED Notes (Signed)
Pt used urinal, sleeping

## 2017-01-26 NOTE — ED Notes (Signed)
Patient ate 75% of his breakfast.

## 2017-01-26 NOTE — ED Notes (Signed)
Regular Diet has been ordered. 

## 2017-01-27 DIAGNOSIS — Z590 Homelessness: Secondary | ICD-10-CM | POA: Diagnosis not present

## 2017-01-27 NOTE — ED Notes (Signed)
Dinner tray ordered. Snack given-Ginger ale, peanut butter and crackers.

## 2017-01-27 NOTE — ED Notes (Signed)
Breakfast tray in room. 

## 2017-01-27 NOTE — ED Notes (Signed)
Pt called out, stating he felt somewhat SOB. Mild wheezing noted. Albuterol inhaler dispensed from [facility], with ordered dose administered to pt. Inhaler labeled & returned to [facility] in pt's Daily Meds drawer.

## 2017-01-27 NOTE — ED Notes (Signed)
Patient eating dinner at this time.

## 2017-01-27 NOTE — ED Notes (Addendum)
Patient ate 95% of his lunch. Resting at this time.

## 2017-01-27 NOTE — ED Notes (Signed)
Graham crackers, peanut butter, and apple juice given at patient's request.

## 2017-01-27 NOTE — ED Notes (Signed)
Patient ate 100% of dinner

## 2017-01-27 NOTE — ED Notes (Signed)
Patient using urinal

## 2017-01-27 NOTE — Progress Notes (Signed)
CSW reached out to [employer] adviser(DSS Worker) with the number that was given. No answer at the number at the number provided.    Claude MangesKierra S. Shonn Farruggia, MSW, LCSW-A Emergency Department Clinical Social Worker [phone]

## 2017-01-28 DIAGNOSIS — Z590 Homelessness: Secondary | ICD-10-CM | POA: Diagnosis not present

## 2017-01-28 NOTE — Progress Notes (Signed)
CSW spoke with Salome Spottedia Turner (DSS worker) and informed her that Mar[facility] IvanLiz from [facility] needed DSS social worker to completed  a regular Medicaid application for pt as he records show that pt has only community Medicaid. After speaking with Tia both CSW and Tia noticed that pt has Medicare part A and B and only would need Medicaid if staying at the facility long term. Tia informed CSW that she would call [facility]Liz at [facility] and gather more needed information. Tia also informed CSW that she would ask the question in court regarding if pt would be at the facility longer term or not. Tia informed CSW that she would call CSW tomorrow after the court hearing with new updates on pt's case. CSW updated Wandra MannanZack Brooks at this time.   Claude MangesKierra S. Solon Alban, MSW, LCSW-A Emergency Department Clinical Social Worker [phone]

## 2017-01-28 NOTE — ED Notes (Signed)
Patient resting on bed listening to TV

## 2017-01-28 NOTE — ED Notes (Signed)
Incont of urine, adult diaper changed and patient given warm blanket

## 2017-01-28 NOTE — ED Notes (Signed)
Attempted to wake patient up for breakfast states he wants to wait a little while

## 2017-01-28 NOTE — ED Notes (Signed)
Patient sitting up in bed eating breakfast.  

## 2017-01-28 NOTE — Progress Notes (Signed)
CSW spoke with Marisue IvanLiz from [facility] [place] about placement for pt. Per Marisue IvanLiz they can offer pt a bed however, per Marisue IvanLiz, DSS social worker would have to apply for regular Medicaid as pt currently has [employer]. CSW left Tia Turner a VM to call CSW back to get the updated information on pt at this time.   Claude MangesKierra S. Nickoli Bagheri, MSW, LCSW-A Emergency Department Clinical Social Worker [phone]

## 2017-01-28 NOTE — Progress Notes (Signed)
CSW went to speak with pt at bedside to discuss the next step in pt's care regarding placement. CSW spoke with pt at bedside, however pt was falling asleep and not responding to CSW questions. CSW was unable to get much feedback from pt as pt appeared to be tired and not wanting to talk to CSW.     Claude MangesKierra S. Kenyen Candy, MSW, LCSW-A Emergency Department Clinical Social Worker [phone]

## 2017-01-29 DIAGNOSIS — Z590 Homelessness: Secondary | ICD-10-CM | POA: Diagnosis not present

## 2017-01-29 NOTE — ED Notes (Signed)
Snack offered and lunch order taken 

## 2017-01-29 NOTE — ED Notes (Signed)
Pt is alert and cooperative. Requests blanket. Given. Pt ate 100% of breakfast

## 2017-01-29 NOTE — ED Notes (Signed)
Meal given

## 2017-01-29 NOTE — Congregational Nurse Program (Signed)
Congregational Nurse Program Note  Date of Encounter: 12/29/2016  Past Medical History: Past Medical History:  Diagnosis Date  . Anxiety   . Arthritis    spine  . Chronic lower back pain   . Contact dermatitis and other eczema, due to unspecified cause   . COPD (chronic obstructive pulmonary disease) (HCC)   . Cough   . Depression    patient constantly hitting left side of face "due to pain"  . Enlarged prostate   . Esophageal reflux   . H/O hiatal hernia   . Headache    PMH: migraines  . Hep C w/o coma, chronic (HCC) early 2013  . History of eye prosthesis   . History of kidney stones   . Hypertension    not on medication at this time  . 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 Multiple facial bone fractures (HCC)   . Multiple open wounds    "from esczema- scratching"  . Osteopenia   . Pancreatitis   . Pneumonia    in past had several times  . Seasonal allergies   . Ulcerative colitis   . Unspecified asthma(493.90)   . Unspecified sinusitis (chronic)     Encounter Details:  Lobby guest- client is blind. States he has a history of sever asthma and allergies. CN spoke with client regarding location of medication. He thinks he may have left them at his previous residence and can't get them back. CN will assist as needed.

## 2017-01-30 DIAGNOSIS — Z590 Homelessness: Secondary | ICD-10-CM | POA: Diagnosis not present

## 2017-01-30 MED ORDER — ALBUTEROL SULFATE (2.5 MG/3ML) 0.083% IN NEBU: 2.5000 mg | INHALATION_SOLUTION | RESPIRATORY_TRACT | Status: DC | PRN

## 2017-01-30 MED ORDER — MOMETASONE FURO-FORMOTEROL FUM 200-5 MCG/ACT IN AERO
2.0000 | INHALATION_SPRAY | Freq: Two times a day (BID) | RESPIRATORY_TRACT | Status: DC
  Administered 2017-02-03 (×2): 2 via RESPIRATORY_TRACT
  Filled 2017-01-30 (×4): qty 8.8

## 2017-01-30 MED ORDER — MONTELUKAST SODIUM 10 MG PO TABS
10.0000 mg | ORAL_TABLET | Freq: Every day | ORAL | Status: DC
  Administered 2017-01-30 – 2017-02-03 (×5): 10 mg via ORAL
  Filled 2017-01-30 (×4): qty 1

## 2017-01-30 MED ORDER — TRIAMCINOLONE ACETONIDE 0.1 % EX CREA
1.0000 "application " | TOPICAL_CREAM | Freq: Two times a day (BID) | CUTANEOUS | Status: DC
  Administered 2017-01-31 – 2017-02-03 (×8): 1 via TOPICAL
  Filled 2017-01-30 (×2): qty 15

## 2017-01-30 NOTE — ED Notes (Signed)
Pt urinated on self.  Pt cleaned and changed.

## 2017-01-30 NOTE — Progress Notes (Signed)
CSW received a phone call from Salome Spotted (DSS worker) regarding the case hearing. Tia began to tell CSW that [name] [name] is responsible for pt. Tia informed CSW that she would send CSW an email with the needed information on pt and the case. CSW has not received an email as of today. CSW was then contacted by Alinda Money and was informed by Alinda Money that he had been appointed to be guardian of pt at this time, however another cort hearing would be held on October 22. Alinda Money asked that CSW send him an email with the information of the facility and the contact person. Alinda Money was informed by Crist Infante that Marisue Ivan( Admission person for [facility]) is asking that a long term Medicaid application be done by the DSS social worker .CSW attempted to email this information to Alinda Money, however the emial address given was incorrect. CSW spoke with [facility] Rife to update her and Hope instructed CSW to reach back out to see if Alinda Money would be able to come see pt before Monday. CSW received no answer and left VM for Alinda Money to call back. CSW will continue to assists with discharge needs.    Claude Manges Kushal Saunders, MSW, LCSW-A Emergency Department Clinical Social Worker [phone]

## 2017-01-30 NOTE — Progress Notes (Addendum)
CSW spoke with Alinda Money [phone]) pt's legal guardian. CSW asked Alinda Money if there was a way that he could come out and see pt before Monday due to the fact that pt has been here for a large amount of time. Alinda Money informed CSW that he would not be able to come se ept until Monday due to the staff at DSS being off on Friday. CSW did provide Alinda Money with an updated email of the facilities information and Alinda Money informed CSW that he would call Marisue Ivan at the facility and speak with her about placement and bed offers at this time. Alinda Money to update CSW as needed.    CSW updated evening ED CSW Christiane Ha.     Claude Manges Anderson Coppock, MSW, LCSW-A Emergency Department Clinical Social Worker [phone]

## 2017-01-30 NOTE — ED Notes (Signed)
Meal tray given to pt.

## 2017-01-30 NOTE — Discharge Planning (Signed)
Per SW:  CSW reached out to Salome Spotted from DSS to follow up with pt's case. At this time CSW has no new details in regards to plan of care for pt. CSW left VM for Tia to call back. CSW awaiting call from Tia at this time.

## 2017-01-30 NOTE — Progress Notes (Signed)
CSW reached out to Salome Spotted from DSS to follow up with pt's case. At this time CSW has no new details in regards to plan of care for pt. CSW left VM for Tia to call back. CSW awaiting call from Tia at this time.    Claude Manges Richardine Peppers, MSW, LCSW-A Emergency Department Clinical Social Worker [phone]

## 2017-01-31 DIAGNOSIS — Z590 Homelessness: Secondary | ICD-10-CM | POA: Diagnosis not present

## 2017-01-31 NOTE — ED Notes (Signed)
Pt eating [place] sandwich given for snack and drinking Sprite. Dinner order request received.

## 2017-01-31 NOTE — ED Notes (Signed)
Offered to change brief and pt. Refused.

## 2017-01-31 NOTE — ED Notes (Signed)
Breakfast tray set up for pt. Urinal w/in reach of pt.

## 2017-01-31 NOTE — ED Notes (Signed)
Washed patient up patient is resting with call bell in reach

## 2017-02-01 DIAGNOSIS — Z590 Homelessness: Secondary | ICD-10-CM | POA: Diagnosis not present

## 2017-02-01 NOTE — ED Notes (Signed)
Pt states he was unable to remove lid from his urinal so soiled himself. Cleansed by staff.

## 2017-02-02 DIAGNOSIS — Z590 Homelessness: Secondary | ICD-10-CM | POA: Diagnosis not present

## 2017-02-02 NOTE — ED Notes (Signed)
Regular Diet ordered for Lunch, and Patient was given a Ginger Ale and Sugar Free Cookies.

## 2017-02-02 NOTE — Progress Notes (Signed)
CSW updated Dr. Mervyn Skeeters (Medical Supervisor) that pt had been seen today by Alinda Money and that he is still working towards placement for pt at [facility].     Claude Manges Dyesha Henault, MSW, LCSW-A Emergency Department Clinical Social Worker [phone]

## 2017-02-02 NOTE — Progress Notes (Addendum)
4:53- Tony  (legal guardian) came to met with pt at bedside. During this time Tony spoke with pt about placement option. Tony informed pt that Tony as well as other staff members have been working towards placement for pt at this time. Pt appeared to be calm during this discussion, however was adamant  about going home and speaking with pt's landlord. Tony informed CSW that he had not heard anything from Liz or Teres ([facility] admission). [name] did contact CSW back while Tony was present and explained that she was unable to reach Liz today to see about admitting pt to the facility and that she is unable to make that decision being that she hadnt spoken with administrators. [name] suggested that  at CSW and Tony should try back tomorrow as Liz should be back. CSW was informed by by Tony that he would be back this week to see pt (no set date when asked). CSW informed Tony that pt can not continue to stay here in the ED, so placement is urgent at this time. Tony asked that CSW complete  an FL2 just in case this facility is unable to take pt so that he can start working on other placement facilities. CSW informed Tony that if the facility wasn't able to then CSW would help as needed. CSW and Tony are to follow up with Liz tomorrow about admitting pt.   CSW reached out to Cherly (Tony supervisor) and left a VM for the call to be returned. CSW also reached out to [name] at [facility]. CSW left another VM requesting that call be returned.      S. , MSW, LCSW-A Emergency Department Clinical Social Worker [phone] 

## 2017-02-02 NOTE — Progress Notes (Signed)
CSW spoke with Alinda Money (DSS guardian) [phone])  and was informed that Alinda Money had been attempting to reach out to Marisue Ivan at [facility] to speak with her regarding placement for pt. Alinda Money also mentioned to CSW that he had spoken with Medicaid and was informed by them that all they needed was an FL2 and Medicaid would be willing to pay retro. Alinda Money suggested that [facility] complete FL2 for pt as they would be the facility responsible for pt. CSW reached out to Marisue Ivan at [facility] and was unable to reach her. CSW was given another number for an individual by the name of [name] [phone]) who is covering for Marisue Ivan at this time. [name] informed CSW that she would read through emails  first and then contact Alinda Money as well as CSW back with an update. Alinda Money was updated by CSW of this and was given [name]'s number as well.   Per Alinda Money, he will be coming out today to speak and see pt. Alinda Money notified CSW that he can sign pt in but has no way of transporting pt to the facility. Alinda Money asked if [facility] would be able to transport pt and CSW informed him that he would have to ask.    Claude Manges Ritisha Deitrick, MSW, LCSW-A Emergency Department Clinical Social Worker [phone]

## 2017-02-02 NOTE — Progress Notes (Signed)
CSW reached out to Alinda Money and [name] to follow up with the status of pt going to [facility]. At this time CSW was unable to speak with either and left VM. CSW is awaiting a returned call.    Claude Manges Eben Choinski, MSW, LCSW-A Emergency Department Clinical Social Worker [phone]

## 2017-02-03 DIAGNOSIS — Z590 Homelessness: Secondary | ICD-10-CM | POA: Diagnosis not present

## 2017-02-03 NOTE — Progress Notes (Addendum)
8:18am-CSW received a phone call from [name] at [facility] informing CSW that pt had an offer from the facility. CSW suggested that [place] notify Alinda Money of this information as Alinda Money is pt's legal guardian as of now and will be signing pt into the facility. [name] informed CSW that she would not be doing the paperwork with Alinda Money, so Alinda Money should reach out to Marisue Ivan in order to get the paperwork complete. CSW informed Alinda Money of the information and explained to him that pt cannot continue to stay here in the ED so seeing that pt gets to the facility is urgent at this point. Alinda Money explained that he had just been given the case on Thursday and has been reaching out to [place] and not getting a returned call. Alinda Money did informed CSW that he would sign pt in and try to reach out to [place] once more. CSW asked would Alinda Money be back today to get pt and Alinda Money stated "I have to wait to speak with liz to get confirmation and then I can go from there. I will be in and out of meetings today so I wont know a set time or date that I can come back". CSW reiterated the importance of pt discharging from the hospital ASAP. CSW is available to continue to assists with needs that will push towards pt being discharged at this time.   7:30am-CSW reached out to Marisue Ivan at [facility] to follow up with the status on taking pt. At this time CSW did not get an answer and is unable to leave a message. CSW will continue to follow up with Marisue Ivan and Alinda Money regarding pt's placement.  Claude Manges Junia Nygren, MSW, LCSW-A Emergency Department Clinical Social Worker [phone]

## 2017-02-03 NOTE — Progress Notes (Addendum)
1:46pm-CSW received a call from teresa at [facility] and informed CSW that [name] [name] had been reachingout to [name] [name] and was waiting for [name] [name] to call back. [name] was informed by CSW that CSW had just spoken with [name] [name] and was told that [name] [name] is waiting for [name] [name] to call him back. [name] said that [name] [name] has been having trouble with her phone and that she would have [name] [name] call [name] [name] again. CSW once again informed both [name] [name] and [name] that getting pt out ASAP is urgent at this time as pt has been here for some time.    1:30pm-CSW spoke with [name] [name] (pt's legal guardian) and confirmed that he did get a VM from with [name] [name]. [name] [name] informed CSW that the VM confirmed that the facility would be able to take pt.[name] [name] informed CSW that he reached back out to [name] [name] to get things in place for pt, but is still waiting to hear back from [facility] as [name] [name] had to leave a VM.    Claude Manges Kearney Evitt, MSW, LCSW-A Emergency Department Clinical Social Worker [phone]

## 2017-02-03 NOTE — ED Provider Notes (Signed)
Social work found placement, will likely be D/c tomorrow.    Melene Plan, DO 02/03/17 1547

## 2017-02-03 NOTE — ED Notes (Signed)
Patient given breakfast tray , sitting up in stretcher eating. No complaints.

## 2017-02-03 NOTE — ED Notes (Signed)
Patient was bathed and am care given.

## 2017-02-03 NOTE — Progress Notes (Addendum)
3:37pm-CSW spoke with [name] and confirmed that pt is good to go to [facility] tomorrow. CSW also spoke with Alinda Money and confirmed that he would be at the facility ahead of time to complete paperwork for pt at [facility]. Alinda Money mentioned that he everything at this point is good and they will assist pt. CSW updated RN Arline Asp of the plan for tomorrow. CSW updated doctor as well.     3:22pm-CSW was informed by Marisue Ivan that pt is able to come to [facility] tomorrow at 1:30. CSW contacted Alinda Money to inform him that pt can go tomorrow and left a VM informing Alinda Money that CSW will set up transportation today for tomorrow at 12 no later than 12:30pm. CSW asked that Alinda Money call back, if not CSW will follow up with him in the morning.   CSW consulted with Wandra Mannan (Clinical Supervisor) to update him of plan at this time. CSW spoke with [employer] and set up transportation for pt at 12pm for 02/04/2017. CSW will update RN of this at this time to ensure that pt is ready.     Claude Manges Jeromey Kruer, MSW, LCSW-A Emergency Department Clinical Social Worker [phone]

## 2017-02-03 NOTE — ED Notes (Signed)
Per CSW:  Pt to be transferred to [facility] in [place] tomorrow [facility] [employer] at 12:00.  Please give Meds and have pt dressed prior to transportation arriving. [facility] contact number [phone]

## 2017-02-04 DIAGNOSIS — Z590 Homelessness: Secondary | ICD-10-CM | POA: Diagnosis not present

## 2017-02-04 MED ORDER — HALOPERIDOL LACTATE 5 MG/ML IJ SOLN
2.0000 mg | Freq: Once | INTRAMUSCULAR | Status: AC
  Administered 2017-02-04: 2 mg via INTRAMUSCULAR
  Filled 2017-02-04: qty 1

## 2017-02-04 NOTE — ED Notes (Signed)
CSW spoke with pt who is now demanding his belongings. This RN informed him that I will be getting them together and will help him get dressed closer to the time of his departure. Pt began screaming and threw call bell at RN. Security called. RN notified Dr. Effie Shy that pt is agitated and he ordered  IM ativan to be given. RN informed Dr. Effie Shy of plans for pt's departure and D/C orders needed by 1130 am. MD agreeable to plan. Security at bedside and assisted with RN giving haldol. Pt very combative and required security to help hold pt while RN gave medication. Door open, pt in bed, screaming, pt is visible to RN.

## 2017-02-04 NOTE — ED Notes (Signed)
Pt ate <50% of meal. Drank all of his apple juice.

## 2017-02-04 NOTE — ED Notes (Signed)
Pt given meal and placed in paper scrubs for transport with [facility]

## 2017-02-04 NOTE — ED Provider Notes (Signed)
11: 35-patient being evaluated for discharge, from the emergency department to go to a skilled nursing facility in [facility].  Patient is alert, and denies problems at this time.  Earlier today I treated him with IM Haldol for belligerent and unusual behavior.  He is resting comfortably at this time.  When I talk to me Snickers and laughs.  He denies audio or visual hallucinations at this time he appears comfortable.  The patient has been evaluated by psychiatry and deemed incompetent.  Following is his documentation:  Treatment Plan Summary: 66 years old male, blindness, homeless, reportedly dropped by his staff members from the assisted living facility. Patient only medical complaint is ankle sprain. Patient denies depression, anxiety and psychosis. Patient is known to have irritability and frustration.  Based on my evaluation patient does not meet criteria for capacity to make his own medical decisions or living arrangements.  Patient needed extremely high support and care. Patient benefit from receiving facility for blind and total care.   Will ask units CSW, referred him to the Department of Social Services for guardianship and housing requirements.   Disposition: No evidence of imminent risk to self or others at present.   Supportive therapy provided about ongoing stressors.  Leata Mouse, MD 01/15/2017 11:01 AM   The patient has been made a ward of the state, and they are therefore his guardian.  Currently his guardian is not here with him but plans on meeting him at the skilled nursing facility in [facility] following transport.  I have changed his disposition to discharge at this time.  The plan will be to continue his usual medications, at the time of discharge.   Mancel Bale, MD 02/04/17 [phone]

## 2017-02-04 NOTE — ED Notes (Signed)
Pt stripped himself naked. Security at bedside. RN gave pt his clothes to change into. Pt threw them on floor and refused to change. Pt screaming and whipping around bed control.

## 2017-02-04 NOTE — ED Notes (Signed)
Pt transferred to wheelchair without resistance or complication. Pt was calm and cooperative. Pt belongings given to transport personnel. pt escorted by security to transport [place] along with driver. Pt medications handed over to driver. D/C Paperwork placed in envelope for receiving facility.

## 2017-02-04 NOTE — ED Notes (Signed)
Pt refused morning medications. RN explained importance of medicine, pt states he will not take them this morning until he talks with CSW. CSW has already been in to see pt. RN will touch base with CSW. RN helped pt sit up to eat breakfast. Pt threw spoon and decided he did not want to eat.

## 2017-02-04 NOTE — ED Notes (Signed)
Pt unwilling to get dressed. Pt urinated on floor on purpose.

## 2017-02-04 NOTE — ED Notes (Signed)
This RN spoke with Josalin, Associate Professor at [facility]. This RN gave report and informed that pt will be transported at 1200 today and arrive approx 1330.

## 2017-02-04 NOTE — Progress Notes (Signed)
CSW spoke with Clinical Supervisor Wandra Mannan and updated him on plan of care at this time. CSW also spoke with [facility] and informed her that pt would be leaving today and asked if pt could be ready by 11:30 as transportation is scheduled to pick pt up at 12pm. CSW spoke with pt's legal guardian Alinda Money and informed him that pt is refusing yelling out "NO" when being told that pt is leaving today. Alinda Money confirmed with CSW that pt has no choice and has to leave the hospital today. Pt has a set place to go [facility]) and is being transported to there today.   Claude Manges Jahaan Vanwagner, MSW, LCSW-A Emergency Department Clinical Social Worker [phone]

## 2017-03-13 ENCOUNTER — Encounter: Payer: Self-pay | Admitting: *Deleted

## 2017-07-30 IMAGING — CR DG CHEST 2V
3 series · 3 of 3 positions shown · non-contrast
Comparison: January 25, 2015

CLINICAL DATA: History of COPD with onset shortness of breath 3
hours ago.

EXAM:
CHEST  2 VIEW

[w chest lat (1 of 2)]
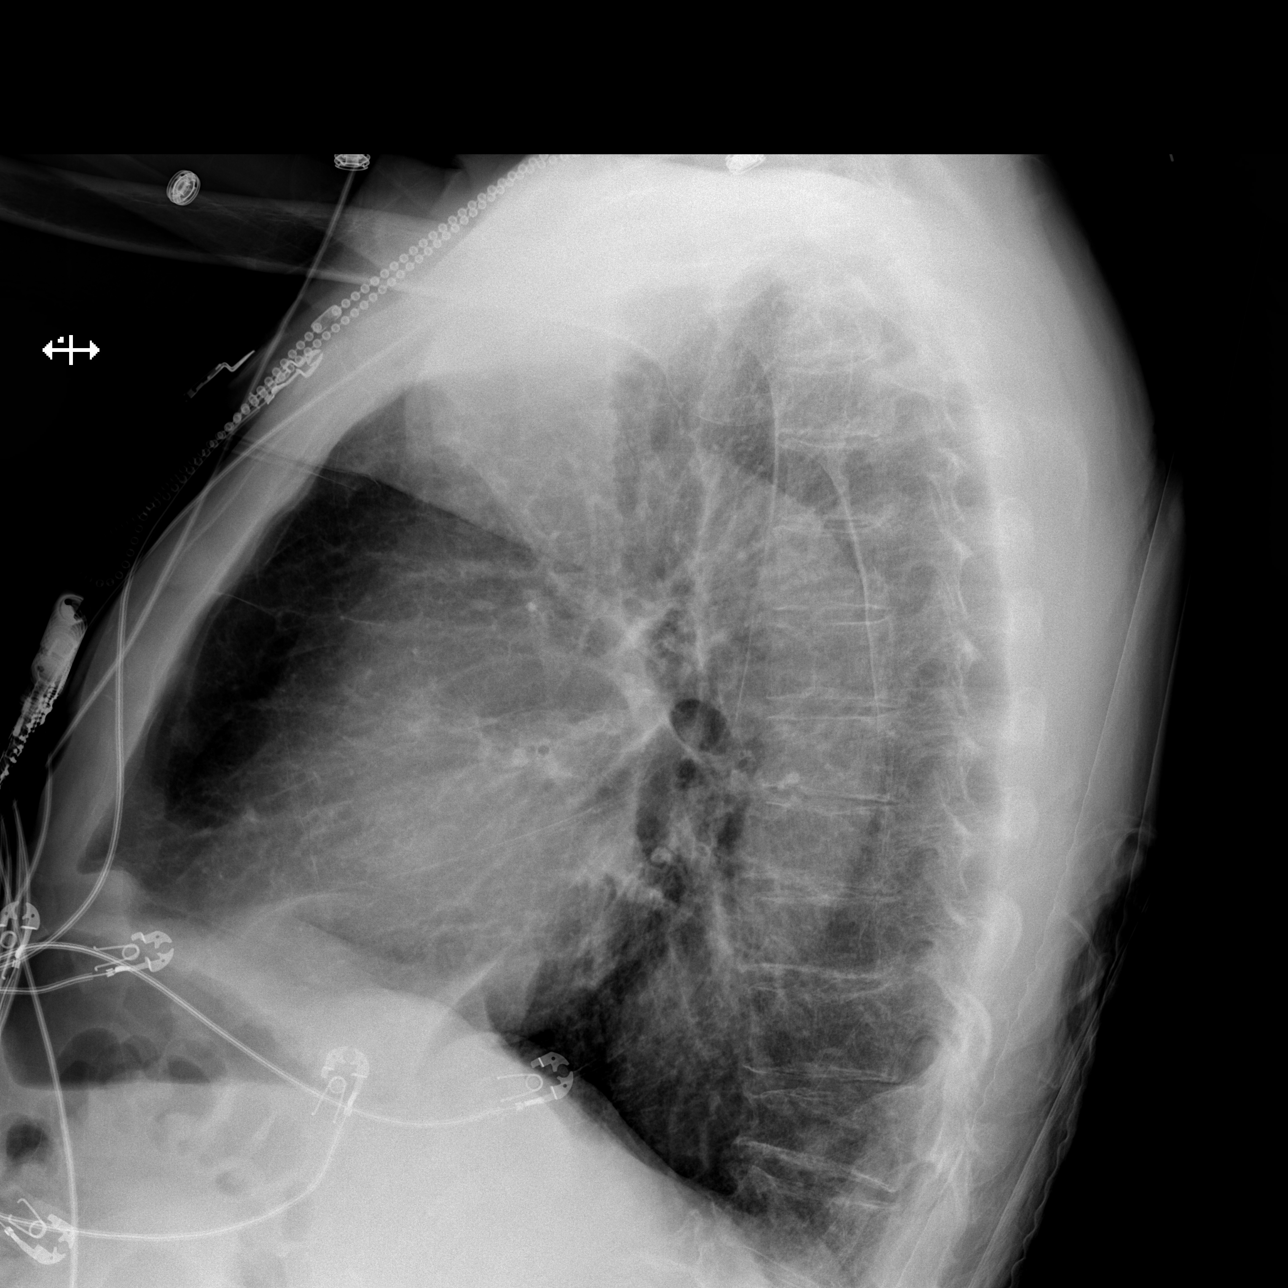

[w chest lat (2 of 2)]
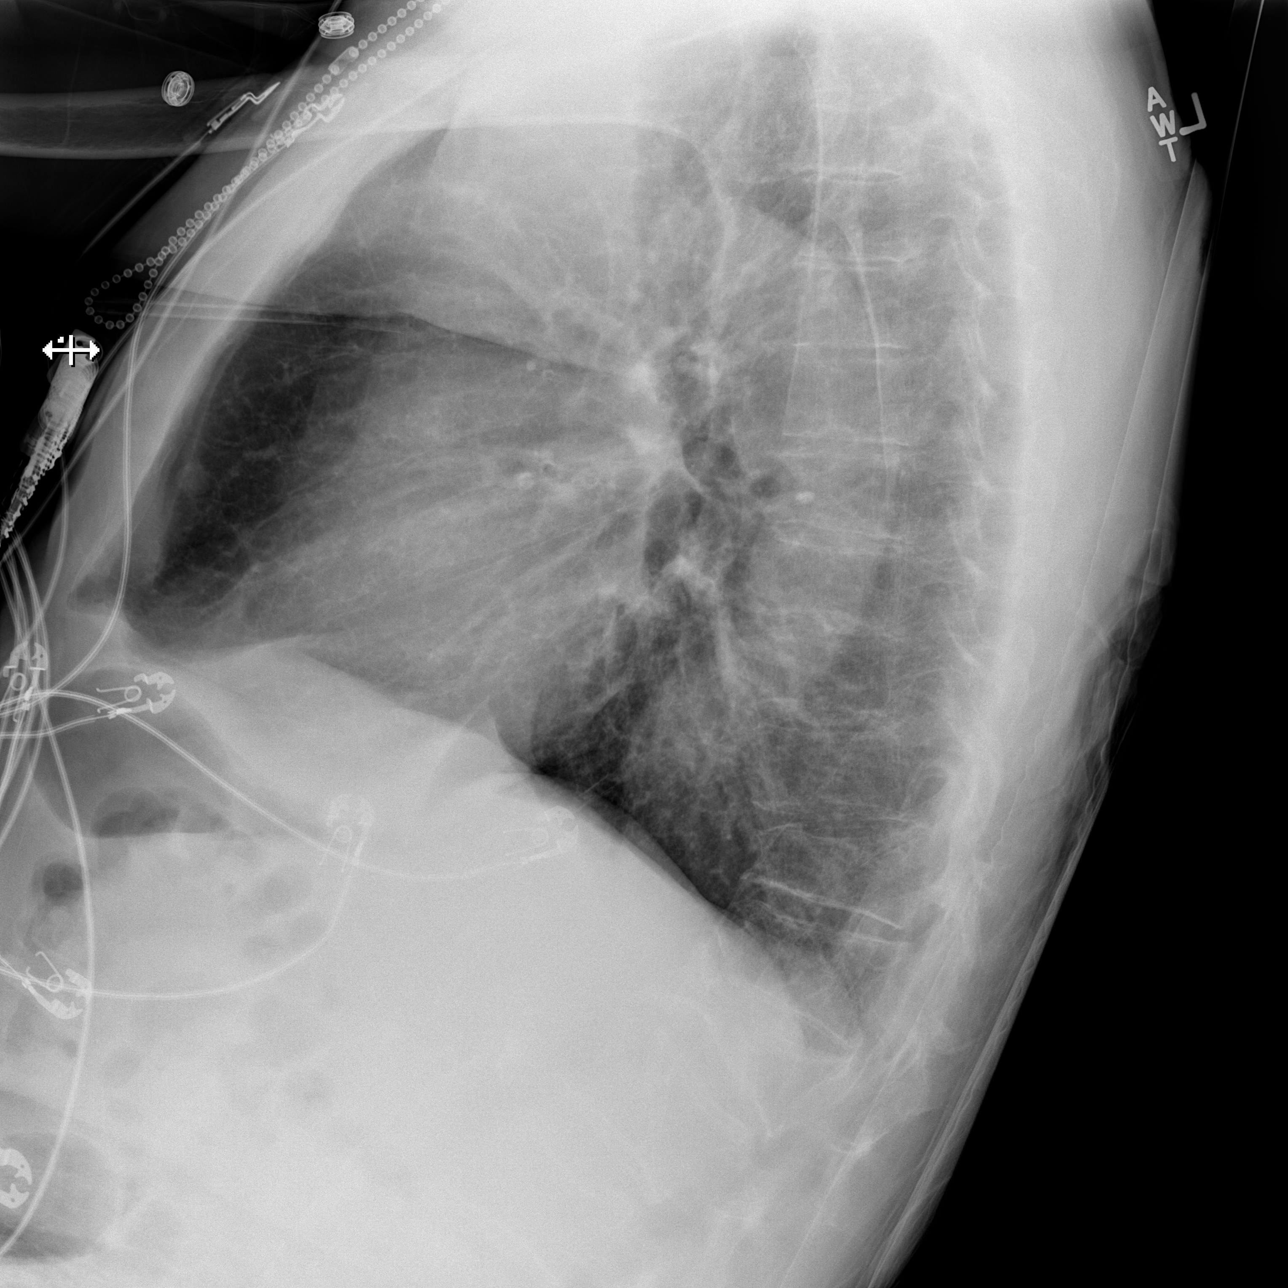

[x chest ap]
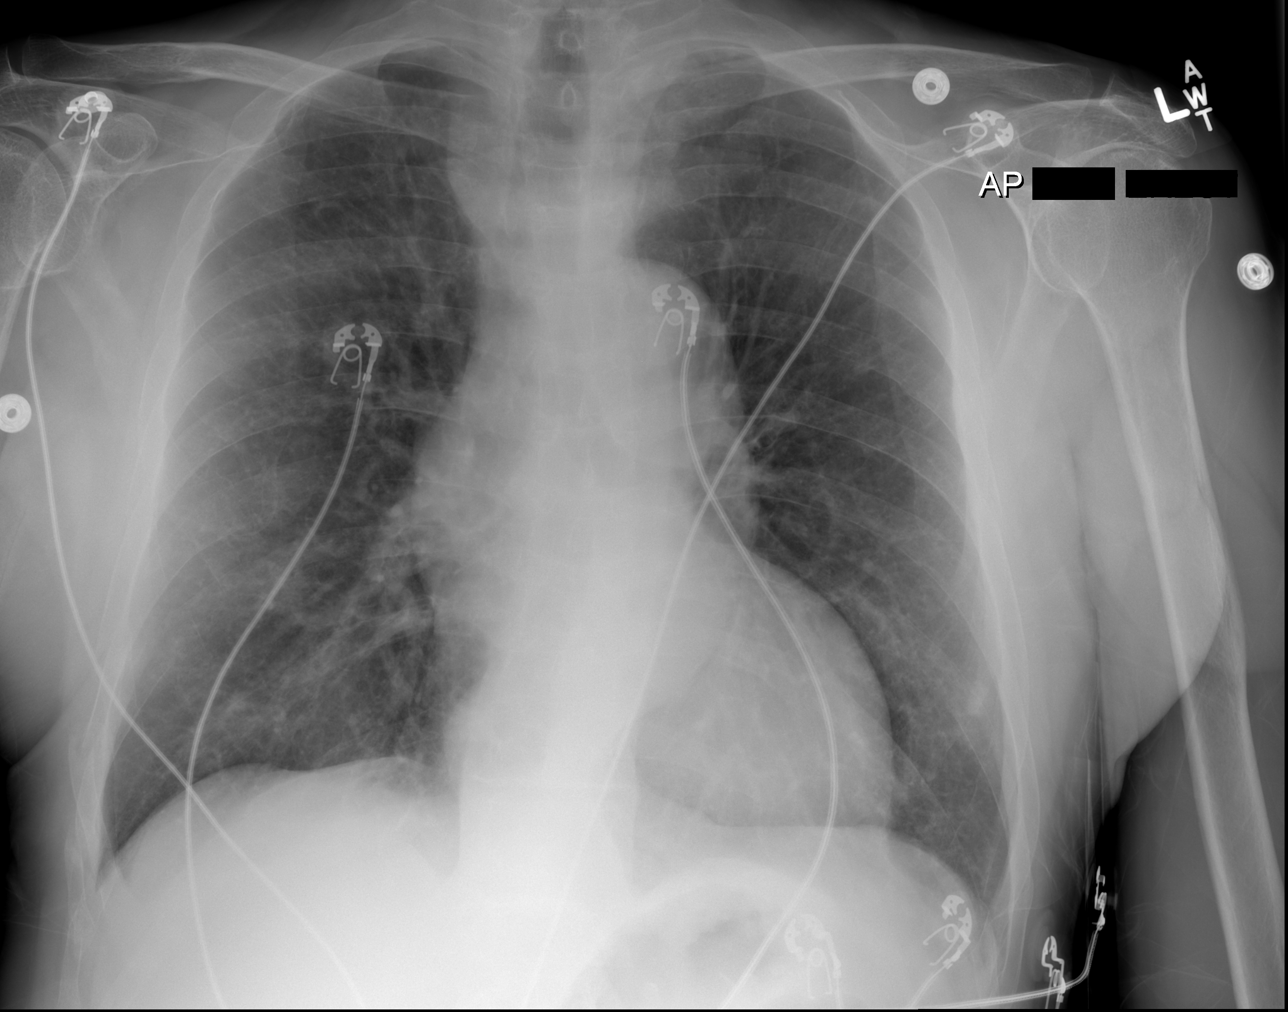

[3 of 3 positions shown; findings below may reference images not displayed]

FINDINGS: The heart size and mediastinal contours are stable. The tortuous
aorta is unchanged. There is no focal infiltrate, pulmonary edema,
or pleural effusion. There is mild scarring of the left lung base
unchanged. The visualized skeletal structures are unremarkable.
IMPRESSION: No active cardiopulmonary disease.

## 2022-07-18 DEATH — deceased
